# Patient Record
Sex: Female | Born: 1940
Health system: Southern US, Community
[De-identification: ages and names within clinical notes are randomized; demographics above are authoritative.]

## PROBLEM LIST (undated history)

## (undated) DIAGNOSIS — I499 Cardiac arrhythmia, unspecified: Secondary | ICD-10-CM

## (undated) DIAGNOSIS — C189 Malignant neoplasm of colon, unspecified: Secondary | ICD-10-CM

## (undated) DIAGNOSIS — F419 Anxiety disorder, unspecified: Secondary | ICD-10-CM

## (undated) DIAGNOSIS — I639 Cerebral infarction, unspecified: Secondary | ICD-10-CM

## (undated) DIAGNOSIS — I10 Essential (primary) hypertension: Secondary | ICD-10-CM

## (undated) DIAGNOSIS — E669 Obesity, unspecified: Secondary | ICD-10-CM

## (undated) DIAGNOSIS — I6529 Occlusion and stenosis of unspecified carotid artery: Secondary | ICD-10-CM

## (undated) DIAGNOSIS — E7800 Pure hypercholesterolemia, unspecified: Secondary | ICD-10-CM

## (undated) HISTORY — DX: Obesity, unspecified: E66.9

## (undated) HISTORY — DX: Malignant neoplasm of colon, unspecified: C18.9

## (undated) HISTORY — DX: Cerebral infarction, unspecified: I63.9

## (undated) HISTORY — DX: Occlusion and stenosis of unspecified carotid artery: I65.29

## (undated) HISTORY — DX: Essential (primary) hypertension: I10

## (undated) HISTORY — DX: Pure hypercholesterolemia, unspecified: E78.00

---

## 2000-08-20 ENCOUNTER — Ambulatory Visit (HOSPITAL_COMMUNITY): Admission: RE | Admit: 2000-08-20 | Discharge: 2000-08-20 | Payer: Self-pay | Admitting: Family Medicine

## 2000-08-20 ENCOUNTER — Encounter: Payer: Self-pay | Admitting: Family Medicine

## 2001-08-26 ENCOUNTER — Ambulatory Visit (HOSPITAL_COMMUNITY): Admission: RE | Admit: 2001-08-26 | Discharge: 2001-08-26 | Payer: Self-pay | Admitting: Family Medicine

## 2001-08-26 ENCOUNTER — Encounter: Payer: Self-pay | Admitting: Family Medicine

## 2007-02-04 ENCOUNTER — Emergency Department (HOSPITAL_COMMUNITY): Admission: EM | Admit: 2007-02-04 | Discharge: 2007-02-04 | Payer: Self-pay | Admitting: Emergency Medicine

## 2007-10-23 ENCOUNTER — Ambulatory Visit: Payer: Self-pay | Admitting: Gastroenterology

## 2007-11-12 ENCOUNTER — Ambulatory Visit (HOSPITAL_COMMUNITY): Admission: RE | Admit: 2007-11-12 | Discharge: 2007-11-12 | Payer: Self-pay | Admitting: Gastroenterology

## 2007-11-12 ENCOUNTER — Ambulatory Visit: Payer: Self-pay | Admitting: Gastroenterology

## 2007-11-12 ENCOUNTER — Encounter: Payer: Self-pay | Admitting: Gastroenterology

## 2007-11-13 ENCOUNTER — Inpatient Hospital Stay (HOSPITAL_COMMUNITY): Admission: AD | Admit: 2007-11-13 | Discharge: 2007-11-18 | Payer: Self-pay | Admitting: General Surgery

## 2007-11-14 ENCOUNTER — Encounter (INDEPENDENT_AMBULATORY_CARE_PROVIDER_SITE_OTHER): Payer: Self-pay | Admitting: General Surgery

## 2007-11-14 HISTORY — PX: RIGHT COLECTOMY: SHX853

## 2007-12-26 ENCOUNTER — Encounter (HOSPITAL_COMMUNITY): Admission: RE | Admit: 2007-12-26 | Discharge: 2008-01-25 | Payer: Self-pay | Admitting: Oncology

## 2007-12-26 ENCOUNTER — Ambulatory Visit (HOSPITAL_COMMUNITY): Payer: Self-pay | Admitting: Oncology

## 2007-12-31 ENCOUNTER — Ambulatory Visit (HOSPITAL_COMMUNITY): Admission: RE | Admit: 2007-12-31 | Discharge: 2007-12-31 | Payer: Self-pay | Admitting: General Surgery

## 2008-02-02 ENCOUNTER — Encounter (HOSPITAL_COMMUNITY): Admission: RE | Admit: 2008-02-02 | Discharge: 2008-03-03 | Payer: Self-pay | Admitting: Oncology

## 2008-02-13 ENCOUNTER — Ambulatory Visit (HOSPITAL_COMMUNITY): Payer: Self-pay | Admitting: Oncology

## 2008-03-08 ENCOUNTER — Encounter (HOSPITAL_COMMUNITY): Admission: RE | Admit: 2008-03-08 | Discharge: 2008-04-07 | Payer: Self-pay | Admitting: Oncology

## 2008-03-30 ENCOUNTER — Ambulatory Visit (HOSPITAL_COMMUNITY): Payer: Self-pay | Admitting: Oncology

## 2008-04-12 ENCOUNTER — Encounter (HOSPITAL_COMMUNITY): Admission: RE | Admit: 2008-04-12 | Discharge: 2008-05-12 | Payer: Self-pay | Admitting: Oncology

## 2008-05-17 ENCOUNTER — Ambulatory Visit (HOSPITAL_COMMUNITY): Payer: Self-pay | Admitting: Oncology

## 2008-05-17 ENCOUNTER — Encounter (HOSPITAL_COMMUNITY): Admission: RE | Admit: 2008-05-17 | Discharge: 2008-06-16 | Payer: Self-pay | Admitting: Oncology

## 2008-06-17 ENCOUNTER — Encounter (HOSPITAL_COMMUNITY): Admission: RE | Admit: 2008-06-17 | Discharge: 2008-07-17 | Payer: Self-pay | Admitting: Oncology

## 2008-08-12 ENCOUNTER — Ambulatory Visit (HOSPITAL_COMMUNITY): Payer: Self-pay | Admitting: Oncology

## 2008-08-12 ENCOUNTER — Encounter (HOSPITAL_COMMUNITY): Admission: RE | Admit: 2008-08-12 | Discharge: 2008-09-11 | Payer: Self-pay | Admitting: Oncology

## 2008-09-23 ENCOUNTER — Encounter (HOSPITAL_COMMUNITY): Admission: RE | Admit: 2008-09-23 | Discharge: 2008-10-23 | Payer: Self-pay | Admitting: Oncology

## 2008-10-07 ENCOUNTER — Ambulatory Visit (HOSPITAL_COMMUNITY): Payer: Self-pay | Admitting: Oncology

## 2008-11-04 ENCOUNTER — Encounter (HOSPITAL_COMMUNITY): Admission: RE | Admit: 2008-11-04 | Discharge: 2008-12-01 | Payer: Self-pay | Admitting: Oncology

## 2008-12-08 ENCOUNTER — Ambulatory Visit: Payer: Self-pay | Admitting: Gastroenterology

## 2008-12-08 DIAGNOSIS — Z862 Personal history of diseases of the blood and blood-forming organs and certain disorders involving the immune mechanism: Secondary | ICD-10-CM | POA: Insufficient documentation

## 2008-12-08 DIAGNOSIS — Z8639 Personal history of other endocrine, nutritional and metabolic disease: Secondary | ICD-10-CM

## 2008-12-08 DIAGNOSIS — I10 Essential (primary) hypertension: Secondary | ICD-10-CM | POA: Insufficient documentation

## 2008-12-08 DIAGNOSIS — E559 Vitamin D deficiency, unspecified: Secondary | ICD-10-CM | POA: Insufficient documentation

## 2008-12-08 DIAGNOSIS — E7800 Pure hypercholesterolemia, unspecified: Secondary | ICD-10-CM | POA: Insufficient documentation

## 2008-12-08 DIAGNOSIS — Z85038 Personal history of other malignant neoplasm of large intestine: Secondary | ICD-10-CM | POA: Insufficient documentation

## 2008-12-16 ENCOUNTER — Ambulatory Visit (HOSPITAL_COMMUNITY): Payer: Self-pay | Admitting: Oncology

## 2008-12-27 ENCOUNTER — Ambulatory Visit: Payer: Self-pay | Admitting: Gastroenterology

## 2008-12-27 ENCOUNTER — Ambulatory Visit (HOSPITAL_COMMUNITY): Admission: RE | Admit: 2008-12-27 | Discharge: 2008-12-27 | Payer: Self-pay | Admitting: Gastroenterology

## 2008-12-27 ENCOUNTER — Encounter: Payer: Self-pay | Admitting: Gastroenterology

## 2008-12-27 HISTORY — PX: COLONOSCOPY: SHX174

## 2009-01-05 ENCOUNTER — Ambulatory Visit (HOSPITAL_COMMUNITY): Admission: RE | Admit: 2009-01-05 | Discharge: 2009-01-05 | Payer: Self-pay | Admitting: General Surgery

## 2009-03-22 ENCOUNTER — Ambulatory Visit (HOSPITAL_COMMUNITY): Payer: Self-pay | Admitting: Oncology

## 2009-04-08 ENCOUNTER — Encounter (INDEPENDENT_AMBULATORY_CARE_PROVIDER_SITE_OTHER): Payer: Self-pay

## 2009-06-14 ENCOUNTER — Encounter (HOSPITAL_COMMUNITY): Admission: RE | Admit: 2009-06-14 | Discharge: 2009-07-14 | Payer: Self-pay | Admitting: Oncology

## 2009-06-15 ENCOUNTER — Ambulatory Visit (HOSPITAL_COMMUNITY): Payer: Self-pay | Admitting: Oncology

## 2009-09-12 ENCOUNTER — Ambulatory Visit (HOSPITAL_COMMUNITY): Payer: Self-pay | Admitting: Oncology

## 2009-09-12 ENCOUNTER — Encounter (HOSPITAL_COMMUNITY): Admission: RE | Admit: 2009-09-12 | Discharge: 2009-10-12 | Payer: Self-pay | Admitting: Oncology

## 2009-12-05 ENCOUNTER — Ambulatory Visit: Payer: Self-pay | Admitting: Cardiology

## 2009-12-05 ENCOUNTER — Inpatient Hospital Stay (HOSPITAL_COMMUNITY): Admission: EM | Admit: 2009-12-05 | Discharge: 2009-12-08 | Payer: Self-pay | Admitting: Emergency Medicine

## 2009-12-05 DIAGNOSIS — I639 Cerebral infarction, unspecified: Secondary | ICD-10-CM

## 2009-12-05 HISTORY — DX: Cerebral infarction, unspecified: I63.9

## 2009-12-06 ENCOUNTER — Encounter (INDEPENDENT_AMBULATORY_CARE_PROVIDER_SITE_OTHER): Payer: Self-pay | Admitting: Internal Medicine

## 2009-12-08 ENCOUNTER — Inpatient Hospital Stay: Admission: AD | Admit: 2009-12-08 | Discharge: 2009-12-28 | Payer: Self-pay | Admitting: Internal Medicine

## 2009-12-21 ENCOUNTER — Encounter (INDEPENDENT_AMBULATORY_CARE_PROVIDER_SITE_OTHER): Payer: Self-pay | Admitting: *Deleted

## 2010-02-28 ENCOUNTER — Encounter (HOSPITAL_COMMUNITY)
Admission: RE | Admit: 2010-02-28 | Discharge: 2010-03-30 | Payer: Self-pay | Source: Home / Self Care | Attending: Oncology | Admitting: Oncology

## 2010-02-28 ENCOUNTER — Ambulatory Visit (HOSPITAL_COMMUNITY): Payer: Self-pay | Admitting: Oncology

## 2010-03-16 ENCOUNTER — Encounter: Payer: Self-pay | Admitting: Internal Medicine

## 2010-03-16 ENCOUNTER — Telehealth (INDEPENDENT_AMBULATORY_CARE_PROVIDER_SITE_OTHER): Payer: Self-pay | Admitting: *Deleted

## 2010-03-20 ENCOUNTER — Ambulatory Visit: Admit: 2010-03-20 | Payer: Self-pay | Admitting: Urgent Care

## 2010-03-25 ENCOUNTER — Encounter (HOSPITAL_COMMUNITY): Payer: Self-pay | Admitting: Oncology

## 2010-04-04 NOTE — Letter (Signed)
Summary: Recall Colonoscopy/Endoscopy, Change to Office Visit  [facility]  [address]   [place], [place] [address]   Phone: [phone]  Fax: [phone]      April 08, 2009   Janice Curtis [address] [place], [place]  [address] October 13, 1940   Dear Ms. MICHELLI,   According to our records, it is time for you to schedule your follow-up colonoscopy. Please call our office to speak with the nurse, and get scheduled for your appointment. Please do not neglect your health.   Sincerely,   Cloria Spring LPN  [facility] Ph: [phone]   Fax: [phone]  Appended Document: Recall Colonoscopy/Endoscopy, Change to Office Visit TCS OCT 25.

## 2010-04-04 NOTE — Letter (Signed)
Summary: Recall Colonoscopy/Endoscopy, Change to Office Visit  [facility]  [address]   [place], [place] [address]   Phone: [phone]  Fax: [phone]      December 21, 2009   Janice Curtis [address] [place], [place]  [address] [birth year]   Dear Ms. PITSTICK,   According to our records, it is time for you to schedule a Colonoscopy/Endoscopy. However, after reviewing your medical record, we recommend an office visit in order to determine your need for a repeat procedure.  Please call [phone] at your convenience to schedule an office visit. If you have any questions or concerns, please feel free to contact our office.   Sincerely,   Ave Filter  [facility] Ph: [phone]   Fax: [phone]

## 2010-04-06 NOTE — Letter (Signed)
Summary: [facility]  [facility]   Imported By: Rexene Alberts 03/16/2010 09:40:28  _____________________________________________________________________  External Attachment:    Type:   Image     Comment:   External Document

## 2010-04-06 NOTE — Progress Notes (Signed)
Summary: Pt cancelled her OV  Phone Note Call from Patient   Summary of Call: pt was set up for an OV for 03/20/10 @ 10am w/KJ (consult for tcs). Pt called back to cancel appt.. she states she had a mild stroke back in Oct 2011 and had another doctor's appt on Tuesday and "she didn't want to be worrying about all this".   I offered to [facility] appt but pt declined. Initial call taken by: Diana Eves,  March 16, 2010 4:49 PM

## 2010-05-15 LAB — CEA: CEA: <0.5 ng/mL (ref 0.0–5.0)

## 2010-05-18 LAB — LIPID PANEL
Cholesterol: 196 mg/dL (ref 0–200)
LDL Cholesterol: 127 mg/dL — ABNORMAL HIGH (ref 0–99)
Triglycerides: 92 mg/dL (ref <150)
VLDL: 18 mg/dL (ref 0–40)

## 2010-05-18 LAB — CBC
HCT: 39.9 % (ref 36.0–46.0)
Hemoglobin: 13.3 g/dL (ref 12.0–15.0)
Hemoglobin: 13.6 g/dL (ref 12.0–15.0)
MCH: 26.9 pg (ref 26.0–34.0)
MCH: 27 pg (ref 26.0–34.0)
MCHC: 33.4 g/dL (ref 30.0–36.0)
MCV: 80.6 fL (ref 78.0–100.0)
Platelets: 181 10*3/uL (ref 150–400)
RBC: 5.04 MIL/uL (ref 3.87–5.11)
RDW: 13.9 % (ref 11.5–15.5)
WBC: 6.4 10*3/uL (ref 4.0–10.5)

## 2010-05-18 LAB — BASIC METABOLIC PANEL
CO2: 25 mEq/L (ref 19–32)
Calcium: 9.1 mg/dL (ref 8.4–10.5)
Chloride: 97 mEq/L (ref 96–112)
Creatinine, Ser: 0.68 mg/dL (ref 0.4–1.2)
GFR calc Af Amer: >60 mL/min (ref >60)
Sodium: 135 mEq/L (ref 135–145)

## 2010-05-18 LAB — DIFFERENTIAL
Basophils Relative: 1 % (ref 0–1)
Eosinophils Absolute: 0.1 10*3/uL (ref 0.0–0.7)
Eosinophils Absolute: 0.1 10*3/uL (ref 0.0–0.7)
Lymphocytes Relative: 32 % (ref 12–46)
Lymphs Abs: 2 10*3/uL (ref 0.7–4.0)
Lymphs Abs: 2.5 10*3/uL (ref 0.7–4.0)
Monocytes Relative: 6 % (ref 3–12)
Monocytes Relative: 6 % (ref 3–12)
Neutro Abs: 2.7 10*3/uL (ref 1.7–7.7)
Neutrophils Relative %: 48 % (ref 43–77)
Neutrophils Relative %: 59 % (ref 43–77)

## 2010-05-18 LAB — COMPREHENSIVE METABOLIC PANEL
ALT: 20 U/L (ref 0–35)
BUN: 7 mg/dL (ref 6–23)
CO2: 28 mEq/L (ref 19–32)
Calcium: 9.2 mg/dL (ref 8.4–10.5)
GFR calc non Af Amer: >60 mL/min (ref >60)
Glucose, Bld: 96 mg/dL (ref 70–99)
Sodium: 135 mEq/L (ref 135–145)
Total Protein: 7.8 g/dL (ref 6.0–8.3)

## 2010-05-18 LAB — PROTIME-INR
INR: 0.96 (ref 0.00–1.49)
Prothrombin Time: 13 seconds (ref 11.6–15.2)

## 2010-05-18 LAB — CARDIAC PANEL(CRET KIN+CKTOT+MB+TROPI)
CK, MB: 1.9 ng/mL (ref 0.3–4.0)
Relative Index: INVALID (ref 0.0–2.5)
Troponin I: 0.01 ng/mL (ref 0.00–0.06)

## 2010-05-18 LAB — MRSA PCR SCREENING: MRSA by PCR: NEGATIVE

## 2010-05-18 LAB — TSH: TSH: 5.177 u[IU]/mL — ABNORMAL HIGH (ref 0.350–4.500)

## 2010-05-21 LAB — CEA: CEA: 1.3 ng/mL (ref 0.0–5.0)

## 2010-05-23 ENCOUNTER — Encounter (HOSPITAL_COMMUNITY): Payer: Medicare Other | Attending: Oncology

## 2010-05-23 ENCOUNTER — Other Ambulatory Visit (HOSPITAL_COMMUNITY): Payer: Medicare Other

## 2010-05-23 DIAGNOSIS — Z85038 Personal history of other malignant neoplasm of large intestine: Secondary | ICD-10-CM | POA: Insufficient documentation

## 2010-05-23 DIAGNOSIS — C183 Malignant neoplasm of hepatic flexure: Secondary | ICD-10-CM

## 2010-05-24 LAB — CEA: CEA: 1.1 ng/mL (ref 0.0–5.0)

## 2010-06-08 IMAGING — CR DG CHEST 1V PORT
1 series · 1 of 1 positions shown · non-contrast
Comparison: Portable exam 3086 hours compared to 11/12/2007

CLINICAL DATA: Port-A-Cath insertion, colon carcinoma

PORTABLE CHEST - 1 VIEW

[view not recorded]
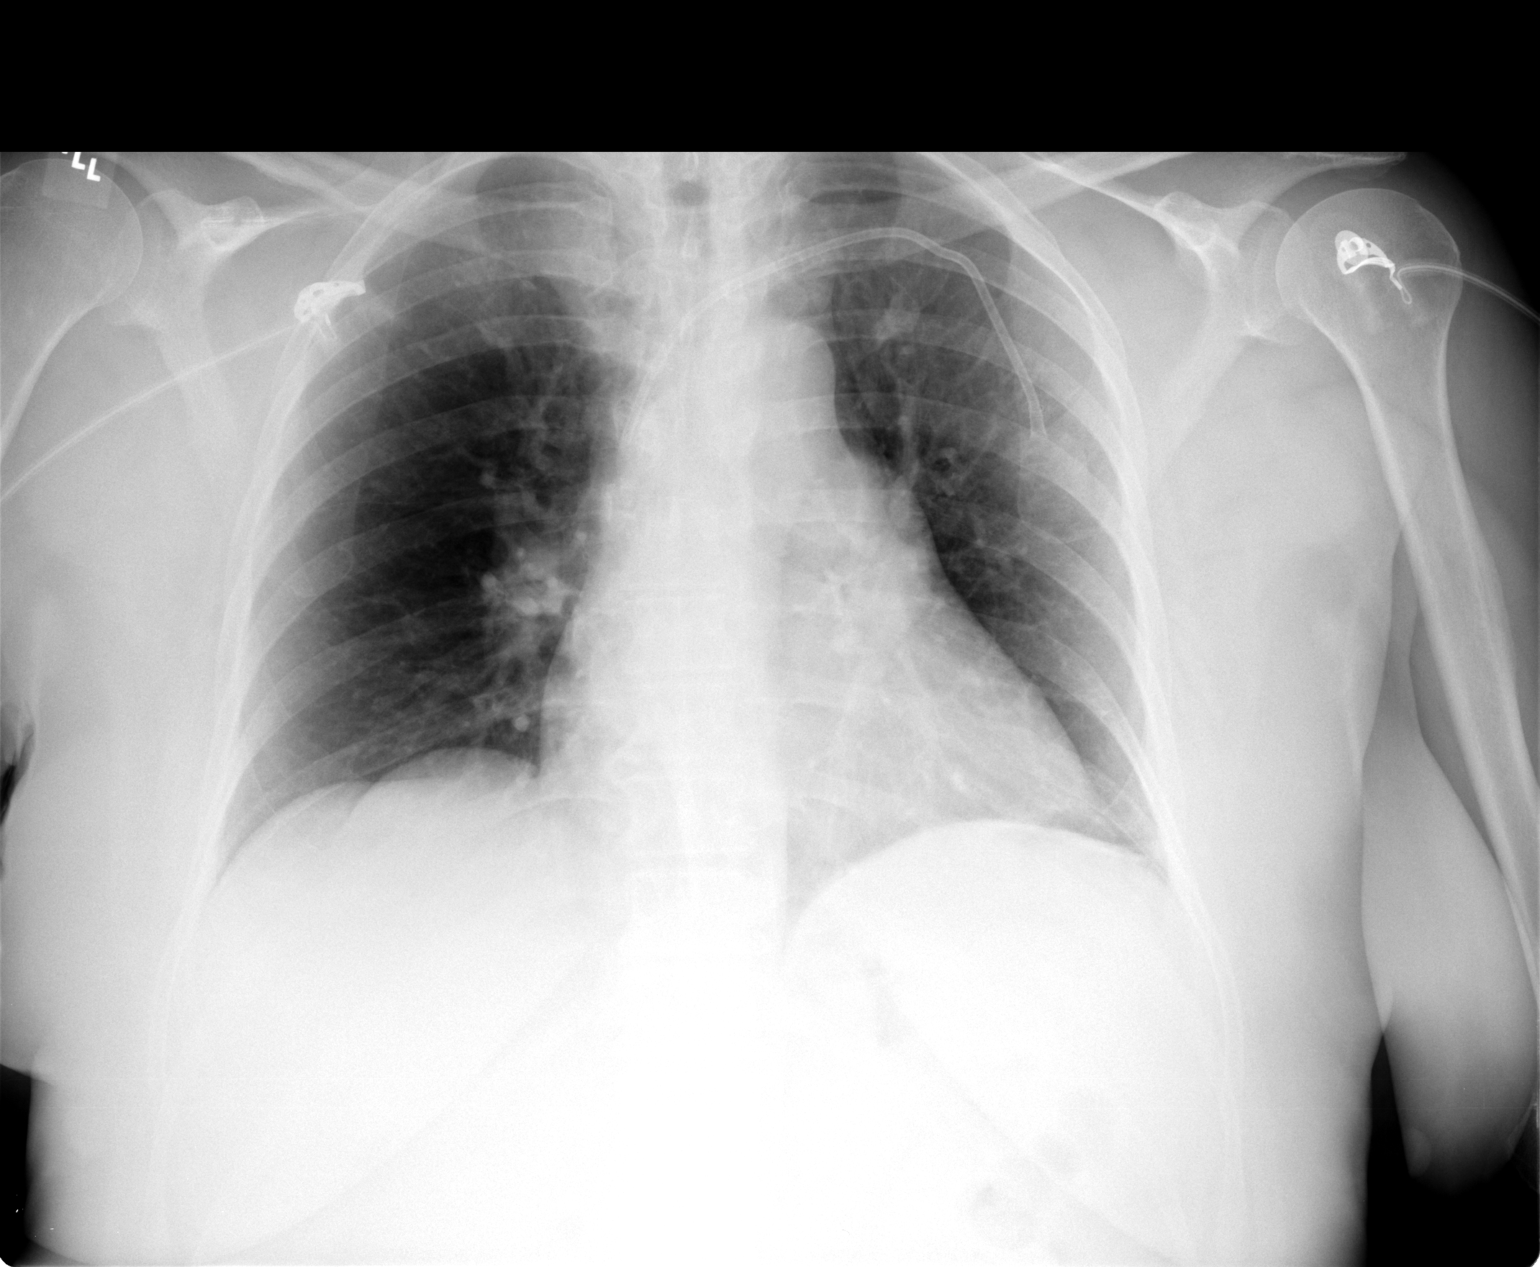

[1 of 1 positions shown; findings below may reference images not displayed]

FINDINGS: New left subclavian Port-A-Cath, tip SVC.
No pneumothorax.
Cardiac enlargement with minimal pulmonary vascular congestion.
Prominent first costochondral junctions bilaterally.
No pulmonary infiltrate or pleural effusion.
Very minimal subsegmental atelectasis left base.
No pulmonary mass or nodule.
IMPRESSION: No pneumothorax following Port-A-Cath insertion.
Cardiomegaly with minimal left basilar atelectasis.

## 2010-06-09 LAB — COMPREHENSIVE METABOLIC PANEL
AST: 36 U/L (ref 0–37)
Albumin: 3.8 g/dL (ref 3.5–5.2)
BUN: 8 mg/dL (ref 6–23)
Calcium: 9.1 mg/dL (ref 8.4–10.5)
Creatinine, Ser: 0.63 mg/dL (ref 0.4–1.2)
GFR calc Af Amer: >60 mL/min (ref >60)
GFR calc non Af Amer: >60 mL/min (ref >60)

## 2010-06-10 LAB — POTASSIUM: Potassium: 3.6 mEq/L (ref 3.5–5.1)

## 2010-06-12 LAB — COMPREHENSIVE METABOLIC PANEL
ALT: 23 U/L (ref 0–35)
Albumin: 3.6 g/dL (ref 3.5–5.2)
Alkaline Phosphatase: 114 U/L (ref 39–117)
BUN: 4 mg/dL — ABNORMAL LOW (ref 6–23)
Chloride: 104 mEq/L (ref 96–112)
Potassium: 3.3 mEq/L — ABNORMAL LOW (ref 3.5–5.1)
Sodium: 139 mEq/L (ref 135–145)
Total Bilirubin: 0.9 mg/dL (ref 0.3–1.2)
Total Protein: 7.6 g/dL (ref 6.0–8.3)

## 2010-06-12 LAB — CBC
HCT: 38 % (ref 36.0–46.0)
Hemoglobin: 13.2 g/dL (ref 12.0–15.0)
MCHC: 34.6 g/dL (ref 30.0–36.0)
MCV: 87.4 fL (ref 78.0–100.0)
Platelets: 120 10*3/uL — ABNORMAL LOW (ref 150–400)
RBC: 4.35 MIL/uL (ref 3.87–5.11)
RDW: 13.6 % (ref 11.5–15.5)
WBC: 4.3 10*3/uL (ref 4.0–10.5)

## 2010-06-12 LAB — DIFFERENTIAL
Basophils Absolute: 0 10*3/uL (ref 0.0–0.1)
Basophils Relative: 1 % (ref 0–1)
Eosinophils Absolute: 0.1 10*3/uL (ref 0.0–0.7)
Eosinophils Relative: 2 % (ref 0–5)
Monocytes Absolute: 0.3 10*3/uL (ref 0.1–1.0)
Monocytes Relative: 8 % (ref 3–12)
Neutro Abs: 1.8 10*3/uL (ref 1.7–7.7)

## 2010-06-12 LAB — CEA: CEA: 1.1 ng/mL (ref 0.0–5.0)

## 2010-06-14 LAB — CBC
HCT: 36.8 % (ref 36.0–46.0)
Hemoglobin: 12.9 g/dL (ref 12.0–15.0)
Hemoglobin: 12.7 g/dL (ref 12.0–15.0)
MCHC: 34.2 g/dL (ref 30.0–36.0)
Platelets: 90 10*3/uL — ABNORMAL LOW (ref 150–400)
RBC: 4.22 MIL/uL (ref 3.87–5.11)
RDW: 16.8 % — ABNORMAL HIGH (ref 11.5–15.5)
RDW: 16 % — ABNORMAL HIGH (ref 11.5–15.5)
WBC: 4.4 10*3/uL (ref 4.0–10.5)

## 2010-06-14 LAB — DIFFERENTIAL
Basophils Absolute: 0 10*3/uL (ref 0.0–0.1)
Basophils Absolute: 0 10*3/uL (ref 0.0–0.1)
Basophils Relative: 1 % (ref 0–1)
Basophils Relative: 1 % (ref 0–1)
Eosinophils Absolute: 0.1 10*3/uL (ref 0.0–0.7)
Lymphocytes Relative: 36 % (ref 12–46)
Monocytes Absolute: 0.6 10*3/uL (ref 0.1–1.0)
Monocytes Relative: 13 % — ABNORMAL HIGH (ref 3–12)
Neutro Abs: 2.5 10*3/uL (ref 1.7–7.7)
Neutro Abs: 2.1 10*3/uL (ref 1.7–7.7)
Neutrophils Relative %: 53 % (ref 43–77)
Neutrophils Relative %: 48 % (ref 43–77)

## 2010-06-14 LAB — COMPREHENSIVE METABOLIC PANEL
ALT: 20 U/L (ref 0–35)
Alkaline Phosphatase: 126 U/L — ABNORMAL HIGH (ref 39–117)
BUN: 4 mg/dL — ABNORMAL LOW (ref 6–23)
Chloride: 103 mEq/L (ref 96–112)
Glucose, Bld: 113 mg/dL — ABNORMAL HIGH (ref 70–99)
Potassium: 2.8 mEq/L — ABNORMAL LOW (ref 3.5–5.1)
Sodium: 139 mEq/L (ref 135–145)
Total Bilirubin: 0.5 mg/dL (ref 0.3–1.2)
Total Protein: 6.4 g/dL (ref 6.0–8.3)

## 2010-06-15 LAB — COMPREHENSIVE METABOLIC PANEL
ALT: 20 U/L (ref 0–35)
Alkaline Phosphatase: 116 U/L (ref 39–117)
CO2: 26 mEq/L (ref 19–32)
Chloride: 106 mEq/L (ref 96–112)
GFR calc non Af Amer: >60 mL/min (ref >60)
Glucose, Bld: 101 mg/dL — ABNORMAL HIGH (ref 70–99)
Potassium: 3.6 mEq/L (ref 3.5–5.1)
Sodium: 138 mEq/L (ref 135–145)
Total Bilirubin: 0.5 mg/dL (ref 0.3–1.2)
Total Protein: 6.7 g/dL (ref 6.0–8.3)

## 2010-06-15 LAB — DIFFERENTIAL
Basophils Absolute: 0 10*3/uL (ref 0.0–0.1)
Basophils Absolute: 0 10*3/uL (ref 0.0–0.1)
Basophils Absolute: 0 10*3/uL (ref 0.0–0.1)
Basophils Relative: 1 % (ref 0–1)
Basophils Relative: 1 % (ref 0–1)
Basophils Relative: 1 % (ref 0–1)
Eosinophils Absolute: 0.1 10*3/uL (ref 0.0–0.7)
Lymphocytes Relative: 32 % (ref 12–46)
Monocytes Relative: 10 % (ref 3–12)
Monocytes Relative: 12 % (ref 3–12)
Neutro Abs: 3.3 10*3/uL (ref 1.7–7.7)
Neutro Abs: 3.2 10*3/uL (ref 1.7–7.7)
Neutrophils Relative %: 57 % (ref 43–77)
Neutrophils Relative %: 55 % (ref 43–77)
Neutrophils Relative %: 60 % (ref 43–77)

## 2010-06-15 LAB — CBC
HCT: 36.5 % (ref 36.0–46.0)
Hemoglobin: 12.3 g/dL (ref 12.0–15.0)
MCHC: 33.9 g/dL (ref 30.0–36.0)
MCHC: 33.7 g/dL (ref 30.0–36.0)
RBC: 4.22 MIL/uL (ref 3.87–5.11)
RBC: 4.31 MIL/uL (ref 3.87–5.11)
RDW: 15.5 % (ref 11.5–15.5)
WBC: 4.5 10*3/uL (ref 4.0–10.5)

## 2010-06-19 LAB — COMPREHENSIVE METABOLIC PANEL
ALT: 18 U/L (ref 0–35)
Albumin: 3.1 g/dL — ABNORMAL LOW (ref 3.5–5.2)
Alkaline Phosphatase: 82 U/L (ref 39–117)
BUN: 6 mg/dL (ref 6–23)
Chloride: 110 mEq/L (ref 96–112)
Glucose, Bld: 119 mg/dL — ABNORMAL HIGH (ref 70–99)
Potassium: 3.7 mEq/L (ref 3.5–5.1)
Sodium: 140 mEq/L (ref 135–145)
Total Bilirubin: 1 mg/dL (ref 0.3–1.2)
Total Protein: 6.4 g/dL (ref 6.0–8.3)

## 2010-06-19 LAB — DIFFERENTIAL
Band Neutrophils: 0 % (ref 0–10)
Basophils Absolute: 0 10*3/uL (ref 0.0–0.1)
Basophils Absolute: 0 10*3/uL (ref 0.0–0.1)
Basophils Relative: 0 % (ref 0–1)
Basophils Relative: 1 % (ref 0–1)
Blasts: 0 %
Eosinophils Absolute: 0.2 10*3/uL (ref 0.0–0.7)
Lymphocytes Relative: 44 % (ref 12–46)
Lymphocytes Relative: 59 % — ABNORMAL HIGH (ref 12–46)
Lymphs Abs: 1.8 10*3/uL (ref 0.7–4.0)
Metamyelocytes Relative: 0 %
Monocytes Absolute: 0.4 10*3/uL (ref 0.1–1.0)
Monocytes Absolute: 0.4 10*3/uL (ref 0.1–1.0)
Monocytes Relative: 14 % — ABNORMAL HIGH (ref 3–12)
Monocytes Relative: 14 % — ABNORMAL HIGH (ref 3–12)
Myelocytes: 0 %
Neutro Abs: 0.7 10*3/uL — ABNORMAL LOW (ref 1.7–7.7)
Neutrophils Relative %: 40 % — ABNORMAL LOW (ref 43–77)
Promyelocytes Absolute: 0 %

## 2010-06-19 LAB — CBC
HCT: 35.6 % — ABNORMAL LOW (ref 36.0–46.0)
HCT: 35.2 % — ABNORMAL LOW (ref 36.0–46.0)
Hemoglobin: 12 g/dL (ref 12.0–15.0)
Hemoglobin: 12.3 g/dL (ref 12.0–15.0)
Hemoglobin: 11.6 g/dL — ABNORMAL LOW (ref 12.0–15.0)
MCHC: 33.4 g/dL (ref 30.0–36.0)
MCHC: 34.5 g/dL (ref 30.0–36.0)
MCV: 79.9 fL (ref 78.0–100.0)
RBC: 4.46 MIL/uL (ref 3.87–5.11)
RBC: 4.62 MIL/uL (ref 3.87–5.11)
RBC: 4.28 MIL/uL (ref 3.87–5.11)
RDW: 25.9 % — ABNORMAL HIGH (ref 11.5–15.5)
RDW: 24.8 % — ABNORMAL HIGH (ref 11.5–15.5)
WBC: 2.8 10*3/uL — ABNORMAL LOW (ref 4.0–10.5)

## 2010-06-20 LAB — COMPREHENSIVE METABOLIC PANEL
ALT: 16 U/L (ref 0–35)
AST: 34 U/L (ref 0–37)
Albumin: 3.4 g/dL — ABNORMAL LOW (ref 3.5–5.2)
Calcium: 8.8 mg/dL (ref 8.4–10.5)
Creatinine, Ser: 0.75 mg/dL (ref 0.4–1.2)
GFR calc Af Amer: >60 mL/min (ref >60)
Sodium: 138 mEq/L (ref 135–145)
Total Protein: 6.6 g/dL (ref 6.0–8.3)

## 2010-06-20 LAB — DIFFERENTIAL
Basophils Relative: 1 % (ref 0–1)
Eosinophils Absolute: 0.1 10*3/uL (ref 0.0–0.7)
Eosinophils Relative: 1 % (ref 0–5)
Eosinophils Relative: 1 % (ref 0–5)
Lymphocytes Relative: 32 % (ref 12–46)
Lymphs Abs: 1.4 10*3/uL (ref 0.7–4.0)
Lymphs Abs: 1.4 10*3/uL (ref 0.7–4.0)
Monocytes Absolute: 0.3 10*3/uL (ref 0.1–1.0)
Monocytes Relative: 6 % (ref 3–12)

## 2010-06-20 LAB — CBC
HCT: 38 % (ref 36.0–46.0)
MCHC: 33.1 g/dL (ref 30.0–36.0)
MCHC: 33.5 g/dL (ref 30.0–36.0)
MCV: 85.7 fL (ref 78.0–100.0)
Platelets: 135 10*3/uL — ABNORMAL LOW (ref 150–400)
RDW: 22 % — ABNORMAL HIGH (ref 11.5–15.5)
RDW: 22.9 % — ABNORMAL HIGH (ref 11.5–15.5)
WBC: 4.1 10*3/uL (ref 4.0–10.5)

## 2010-06-20 LAB — CEA: CEA: 1.5 ng/mL (ref 0.0–5.0)

## 2010-07-18 NOTE — Op Note (Signed)
NAME:  Janice Curtis, ARDILA                 ACCOUNT NO.:  [id number]   MEDICAL RECORD NO.:  [id number]          PATIENT TYPE:  AMB   LOCATION:  DAY                           FACILITY:  [facility]   PHYSICIAN:  Kassie Mends, M.D.      DATE OF BIRTH:  17-Jun-1940   DATE OF PROCEDURE:  11/12/2007  DATE OF DISCHARGE:                               OPERATIVE REPORT   PROCEDURE:  Colonoscopy with Jumbo cold forceps biopsies with Spot  tattoo.   INDICATION FOR EXAM:  Ms. Stapp is a 70 year old female with abdominal  cramping and loose stool.  She never had a colonoscopy.   FINDINGS:  1. Large colon mass narrowing the lumen to less than 10 mm.  The      biopsies were obtained via cold forceps to evaluate for      adenocarcinoma of the colon.  The scope could not be passed beyond      the distal aspect of the mass.  The mass appears to be in the      proximal to mid transverse colon. The distal aspect of mass      tattooed with Spot.  2. Small internal hemorrhoids.  Otherwise, no diverticula or AVMs      seen.   DIAGNOSES:  1. Large constricting colon mass.  2. Small internal hemorrhoids.   RECOMMENDATIONS:  1. CT scan of the abdomen, pelvis, chest x-ray, CBC, PT/PTT, complete      metabolic panel, and Type & Cross for 2u pRBCS today.  2. Patient will a colon resection.  Discussed with Dr. Franky Macho:      appointment 11/13/07-930AM regarding her colon resection.  3. She should be on a clear liquid diet after the CT scan.  4. If the patient needs a right hemicolectomy then repeat colonoscopy      in 1 year.  If she has a left hemicolectomy she will need a repeat      colonoscopy within 6 months.   MEDICATIONS:  1. Demerol 100 mg IV.  2. Versed 5 mg IV.   PROCEDURE TECHNIQUE:  Physical exam was performed.  Informed consent was  obtained from the patient after explaining the benefits, risks and  alternatives to the procedure.  The patient was connected to the monitor  and placed in the left  lateral position.  Continuous oxygen was provided  by nasal cannula, IV medicine administered through an indwelling  cannula.  After administration of sedation and rectal exam, the  patient's rectum was intubated.  The scope  was advanced under direct visualization to the proximal transverse  colon.  The scope was removed slowly by carefully examining the color,  texture, anatomy and integrity of the mucosa on the way out.  The  patient was recovered in endoscopy and discharged home in satisfactory  condition.      Kassie Mends, M.D.  Electronically Signed     SM/MEDQ  D:  11/12/2007  T:  11/12/2007  Job:  [id number]   cc:   Anner Crete  c/o Dr Lovena Le, M.D.  Fax: [phone]   [name], M.D.  Fax: [phone]

## 2010-07-18 NOTE — Consult Note (Signed)
NAME:  Janice Curtis, Janice Curtis                 ACCOUNT NO.:  [id number]   MEDICAL RECORD NO.:  [id number]          PATIENT TYPE:  AMB   LOCATION:  DAY                           FACILITY:  [facility]   PHYSICIAN:  Kassie Mends, M.D.      DATE OF BIRTH:  December 05, 1940   DATE OF CONSULTATION:  10/23/2007  DATE OF DISCHARGE:                                 CONSULTATION   CHIEF COMPLAINT:  Loose stools occasionally, occasional abdominal  cramping, need colonoscopy.   PHYSICIAN REQUESTING CONSULTATION:  [name], MD   HISTORY OF PRESENT ILLNESS:  The patient is a 70 year old African  American female who presents today for further evaluation of  intermittent abdominal cramping and loose stools. She had a routine  physical recently done at [facility] by Earma Reading, PA-  C.  She complained of some intermittent diarrhea and abdominal cramping.  She has never had a colonoscopy.  She was, therefore, referred for  further evaluation.  The patient states that overall she feels well.  She does have occasional loose stools especially if she eats greasy or  spicy foods.  This happens a couple of times a month.  This is usually  associated with some lower abdominal cramping.  Sometimes she will have  the abdominal cramping without any bowel movements.  She denies any  blood in the stool or melena.  Her appetite is good.  No nausea,  vomiting, heartburn, dysphagia, odynophagia, or weight loss.  She never  had a colonoscopy.   CURRENT MEDICATIONS:  1. Verapamil daily.  2. Iron daily.  3. Vitamin D weekly.  4. Aspirin 81 mg daily.   ALLERGIES:  No known drug allergies.   PAST MEDICAL HISTORY:  Hypertension and hypercholesterolemia.   FAMILY HISTORY:  Mother diseased at age 22 with aneurysm, father  diseased at age 79 with stroke.   SOCIAL HISTORY:  She is divorced.  She is retired from [employer]  She  has never been a smoker.  No alcohol use.   REVIEW OF SYSTEMS:  See HPI for GI.   Constitutional:  See HPI.  Cardiopulmonary:  No chest pain, shortness of breath, palpitations, or  cough.  Genitourinary:  No dysuria or hematuria.   PHYSICAL EXAMINATION:  VITAL SIGNS:  Weight 181, height 5 feet 3 inches,  temp 98.7, blood pressure 160/80, and pulse 64.  GENERAL:  Pleasant, well-nourished, well-developed female in no acute  distress.  SKIN:  Warm and dry.  No jaundice.  HEENT:  Sclerae nonicteric.  Oropharyngeal mucosa moist and pink.  No  lesions, erythema, or exudate.  No lymphadenopathy or thyromegaly.  CHEST:  Lungs are clear to auscultation.  CARDIAC:  Regular rate and rhythm.  Normal S1 and S2.  No murmurs, rubs,  or gallops.  ABDOMEN:  Positive bowel sounds.  Soft, nontender, and nondistended.  No  organomegaly or masses.  No rebound or guarding.  No abdominal bruits or  hernias.  LOWER EXTREMITIES:  No edema.   IMPRESSION:  Ms. Puryear is a 70 year old lady who presents with  intermittent infrequent abdominal cramping  and loose stools.  She has  never had a colonoscopy.  She may have irritable bowel syndrome, but  other etiologies such as colitis, stricture, polyps, malignancy need to  be excluded.  I discussed risks, alternatives, and benefits with regards  to, but not limited to the risk, reaction to medication, bleeding,  infection, and perforation at time of colonoscopy and the patient is  agreeable to proceed.   PLAN:  Colonoscopy with Dr. Kassie Mends in the near future.  She will  hold her iron for 7 days prior to procedure.  She will hold her aspirin  for 4 days prior to procedure.  We would like to retrieve a copy of her  labs done recently at [facility].  Further  recommendations to follow.  We would like to thank Earma Reading for  allowing [place] to take part in the care of this patient.      Tana Coast, P.A.      Kassie Mends, M.D.  Electronically Signed    LL/MEDQ  D:  10/23/2007  T:  10/24/2007  Job:  [id number]   cc:    [name], M.D.  Fax: [phone]   Earma Reading, PA-C

## 2010-07-18 NOTE — Discharge Summary (Signed)
NAME:  Janice Curtis, PAWLEY NO.:  [id number]   MEDICAL RECORD NO.:  [id number]          PATIENT TYPE:  INP   LOCATION:  A320                          FACILITY:  [facility]   PHYSICIAN:  [name], M.D.  DATE OF BIRTH:  24-May-1940   DATE OF ADMISSION:  11/13/2007  DATE OF DISCHARGE:  09/15/2009LH                               DISCHARGE SUMMARY   HOSPITAL COURSE SUMMARY:  The patient is a 70 year old black female who  was referred to my care for a colon mass in the ascending colon.  It was  near obstructing in nature.  On preoperative workup, she was found to be  anemic and hypokalemic.  She was admitted to the hospital on November 13, 2007 and received 1 unit packed red blood cells as well as potassium  supplementation.  She subsequently underwent a laparoscopic hand-  assisted right hemicolectomy on November 14, 2007.  She tolerated the  procedure well.  Her postoperative course was for the most part  unremarkable.  Her diet was advanced without difficulty once her bowel  function returned.  She was still mildly anemic with hematocrit of 28 at  the time of discharge, but she was asymptomatic.  Final pathology  revealed T4N1M0 adenocarcinoma of the colon.  She has been made aware of  the diagnosis.   The patient is being discharged home on postoperative day #4 in good and  improving condition.   DISCHARGE INSTRUCTIONS:  The patient is to follow up with Dr. Franky Macho on November 25, 2007.   DISCHARGE MEDICATIONS:  1. Vicodin 1-2 tablets p.o. q.4 h. p.r.n. pain  2. Verapamil 120 mg 2 tablets p.o. q.a.m., 1 tablet p.o. q.p.m.  3. Vitamin D supplementation.  4. Iron supplements.  5. Zocor 1 tablet p.o. daily.   PRINCIPAL DIAGNOSIS:  1. T4N1M0 adenocarcinoma of the colon.  2. Hypertension.  3. Anemia.  4. Hypokalemia, resolved.   PRINCIPAL PROCEDURE:  Laparoscopic hand-assisted right hemicolectomy on  November 14, 2007.      [name], M.D.  Electronically Signed     MAJ/MEDQ  D:  11/18/2007  T:  11/19/2007  Job:  [id number]   cc:   Kassie Mends, M.D.  [address]  [place] , [place] [address]   [name], M.D.  Fax: [phone]

## 2010-07-18 NOTE — H&P (Signed)
NAME:  Janice Curtis, Janice Curtis NO.:  [id number]   MEDICAL RECORD NO.:  [id number]          PATIENT TYPE:  INP   LOCATION:  A320                          FACILITY:  [facility]   PHYSICIAN:  [name], M.D.  DATE OF BIRTH:  Mar 10, 1940   DATE OF ADMISSION:  11/13/2007  DATE OF DISCHARGE:  LH                              HISTORY & PHYSICAL   CHIEF COMPLAINT:  Neoplasm, unspecified.   HISTORY OF PRESENT ILLNESS:  Patient is a 70 year old black female who  is referred for evaluation and treatment of a colon mass.  This is near-  obstructing in nature and is at the hepatic flexure.  This was found on  routine colonoscopy for anemia.  Patient states that she has been anemic  for some time and has taken iron supplements in the past.  She denies  any nausea or vomiting.  She has had some bloody stools recently.  No  fevers or chills have been noted.  She has been on a clear-liquid diet  for the past four days.   PAST MEDICAL HISTORY:  Hypertension.   PAST SURGICAL HISTORY:  Unremarkable.   CURRENT MEDICATIONS:  1. Verapamil.  2. Iron supplements.  3. Vitamin D.   ALLERGIES:  No known drug allergies.   REVIEW OF SYSTEMS:  Noncontributory.   Patient denies any family history of colon carcinoma.  She denies any  recent chest pain, MI, CVA, or diabetes mellitus.   PHYSICAL EXAMINATION:  Patient is a well-developed and well-nourished  black female in no acute distress.  LUNGS:  Clear to auscultation with equal breath sounds bilaterally.  HEART:  Regular rate and rhythm without S3, S4, or murmurs.  ABDOMEN:  Soft, nontender, nondistended.  No hepatosplenomegaly, masses,  or hernias are identified.   CT scan of the abdomen reveals a mass at the hepatic flexure with a tiny  filling defect of the left lobe of the liver.   Her hematocrit is 28, potassium 3.2, CEA level 1.6.   IMPRESSION:  1. Neoplasm, unspecified.  2. Anemia, iron deficiency.  3. Hypokalemia.   PLAN:   Patient will be admitted to the hospital for a blood transfusion  and potassium supplementation.  She will subsequently have a  laparoscopic hand-assisted right hemicolectomy on 11/14/07.  The risks  and benefits of the procedure, including bleeding, infection,  cardiopulmonary difficulties, the possibility of a blood transfusion,  and a the possibility of an open procedure were fully explained to the  patient, who gave informed consent.  She does realize this colon mass  needs to be removed whether or not it is malignant or not by biopsy.      [name], M.D.  Electronically Signed     MAJ/MEDQ  D:  11/13/2007  T:  11/13/2007  Job:  [id number]   cc:   [facility]  Fax: [phone]   Kassie Mends, M.D.  [address]  [place] , [place] [address]   [name], M.D.  Fax: [phone]

## 2010-07-18 NOTE — Op Note (Signed)
NAME:  Janice Curtis, Janice Curtis NO.:  [id number]   MEDICAL RECORD NO.:  [id number]          PATIENT TYPE:  INP   LOCATION:  A320                          FACILITY:  [facility]   PHYSICIAN:  [name], M.D.  DATE OF BIRTH:  1940-09-26   DATE OF PROCEDURE:  11/14/2007  DATE OF DISCHARGE:                               OPERATIVE REPORT   PREOPERATIVE DIAGNOSIS:  Colon neoplasm, unspecified.   POSTOPERATIVE DIAGNOSIS:  Colon neoplasm, unspecified.   PROCEDURE:  Laparoscopic hand-assisted right hemicolectomy.   SURGEON:  [name], MD   ANESTHESIA:  General endotracheal.   INDICATIONS:  The patient is a 70 year old black female who was found on  colonoscopy by Dr. Kassie Mends, to have a colon neoplasm, which was near  obstructing at the hepatic flexure.  CT scan of the abdomen and pelvis  confirms the mass in the hepatic flexure as well as a very tiny filling  defect in the left lobe of the liver.  The patient now comes to the  operating for a laparoscopic hand-assisted right hemicolectomy.  Risks  and benefits of the procedure including bleeding, infection,  cardiopulmonary difficulties, the possibility of blood transfusion, and  the possibility of an open procedure were fully explained to the  patient, gave informed consent.   PROCEDURE NOTE:  The patient was placed in the lithotomy position after  induction of general endotracheal anesthesia.  The abdomen was prepped  and draped using the usual sterile technique with Betadine.  Surgical  site confirmation was performed.   A Pfannenstiel incision was made down to the fascia.  The peritoneal  cavity was entered without difficulty.  A GelPort was then inserted  without difficulty.  An additional 11-mm trocar was placed in the  supraumbilical region and 11-mm trocar was placed in the left lower  quadrant region.  The abdomen was then insufflated without difficulty.  The liver was inspected and the small defect was  seen on CAT scan, it  was a benign hemangioma.  The nasogastric tube was noted to be in  appropriate position in the stomach.  The rest of the liver was within  normal limits.  The mass was noted to be tattooed and at the hepatic  flexure.  The right colon was mobilized along the peritoneal reflection.  This was taken around and adhesions to the liver were also lysed using  the LigaSure.  The mesentery of the right colon and proximal transverse  colon was then mobilized.  The gastrocolic omentum was then lysed using  the LigaSure.  The terminal ileum, ascending colon, and proximal  transverse colon was then exteriorized through the GelPort site.  A GIA  stapler was placed across the terminal ileum as well as the mid  transverse colon and fired.  The mesentery was then divided using the  LigaSure.  The specimen was sent to pathology for further examination.  A side-to-side ileocolic anastomosis was then performed using a GIA  stapler.  The staple line was bolstered using 3-0 silk Lambert sutures.  Omentum was placed over the anastomosis and the bowel was returned to  the abdominal cavity in orderly fashion.  The abdomen was then  reinsufflated and anastomosis was noted be widely patent.  No active  bleeding was noted within the abdominal cavity.  The abdominal cavity  was then copiously irrigated with gentamicin and normal saline.  All  fluid and air were then evacuated from the abdominal cavity prior to  removal of the GelPort and the trocars.   All wounds were irrigated with normal saline.  All wounds were injected  with 0.5% Sensorcaine.  The Pfannenstiel peritoneal layer was closed  using 0 chromic running suture.  The fascia was reapproximated using 0  Vicryl interrupted sutures.  The subcutaneous layer was reapproximated  using 2-0 Vicryl interrupted sutures.  The skin was closed using  staples.  The other trocar sites were closed using staples.  Betadine  ointment and dry sterile  dressings were applied.   All tape and needle counts correct at the end of the procedure.  The  patient was extubated in the operating room and went back to recovery  room awake and in stable condition.   COMPLICATIONS:  None.   SPECIMEN:  Right colon.   ESTIMATED BLOOD LOSS:  100 mL.      [name], M.D.  Electronically Signed     MAJ/MEDQ  D:  11/14/2007  T:  11/15/2007  Job:  [id number]   cc:   Kassie Mends, M.D.  [address]  [place] , [place] [address]   [name], M.D.  Fax: [phone]

## 2010-07-18 NOTE — H&P (Signed)
NAME:  Janice Curtis, SPLINTER NO.:  [id number]   MEDICAL RECORD NO.:  [id number]          PATIENT TYPE:  AMB   LOCATION:  DAY                           FACILITY:  [facility]   PHYSICIAN:  [name], M.D.  DATE OF BIRTH:  June 22, 1940   DATE OF ADMISSION:  DATE OF DISCHARGE:  LH                              HISTORY & PHYSICAL   CHIEF COMPLAINT:  Colon carcinoma, need for central venous access.   HISTORY OF PRESENT ILLNESS:  The patient is a 70 year old black female  recently diagnosed with colon cancer, who was about to undergo  chemotherapy and needs a Port-A-Cath for central venous access.   PAST MEDICAL HISTORY:  Includes hypertension.   PAST SURGICAL HISTORY:  Laparoscopic right hemicolectomy on November 14, 2007.   CURRENT MEDICATIONS:  Verapamil, cholesterol supplements, and iron  supplements.   ALLERGIES:  No known drug allergies.   REVIEW OF SYSTEMS:  Noncontributory.   PHYSICAL EXAMINATION:  VITAL SIGNS:  The patient is a well-developed,  well-nourished black female in no acute distress.  LUNGS:  Clear to auscultation with equal breath sounds bilaterally.  HEART:  Reveals a regular rate and rhythm without S3, S4, or murmurs.   IMPRESSION:  Colon carcinoma, need for central venous access.   PLAN:  The patient is scheduled for Port-A-Cath insertion on December 31, 2007.  The risks and benefits of the procedure including bleeding,  infection, and pneumothorax were fully explained to the patient, who  gave informed consent.      [name], M.D.  Electronically Signed     MAJ/MEDQ  D:  12/26/2007  T:  12/26/2007  Job:  [id number]   cc:   Ladona Horns. Mariel Sleet, MD  Fax: [phone]   Kassie Mends, M.D.  [address]  [place] , [place] [address]   [name], M.D.  Fax: [phone]

## 2010-07-18 NOTE — Op Note (Signed)
NAME:  Janice Curtis, Janice Curtis               ACCOUNT NO.:  [id number]   MEDICAL RECORD NO.:  [id number]          PATIENT TYPE:  AMB   LOCATION:  DAY                           [facility]AC[facility]   PHYSICIAN:  [name], M.D.  DATE OF BIRTH:  [birth year]   DATE OF PROCEDURE:  12/31/2007  DATE OF DISCHARGE:                               OPERATIVE REPORT   PREOPERATIVE DIAGNOSIS:  Colon carcinoma, need for central venous  access.   POSTOPERATIVE DIAGNOSIS:  Colon carcinoma, need for central venous  access.   PROCEDURE:  Port-A-Cath insertion.   SURGEON:  [name], MD   ANESTHESIA:  MAC.   INDICATIONS:  The patient is a 70 year old black female with colon  carcinoma who is about to undergo chemotherapy and needs central venous  access.  The risks and benefits of the procedure including bleeding,  infection, pneumothorax were fully explained to the patient, gave  informed consent.   PROCEDURE NOTE:  The patient was placed in Trendelenburg position after  the left upper chest was prepped and draped using the usual sterile  technique with Betadine.  Surgical site confirmation was performed.  Xylocaine 1% was used for local anesthesia.   A transverse incision was made in the left infraclavicular region.  Subcutaneous pocket was then formed.  A needle was advanced into left  subclavian vein using the Seldinger technique without difficulty.  A  guidewire was then advanced into the right atrium under fluoroscopic  guidance.  An introducer and peel-away sheath were then placed over the  guidewire.  The catheter was then inserted through the peel-away sheath  and the peel-away sheath was removed.  The catheter was then attached to  the port and the port placed in subcutaneous pocket.  Adequate  positioning was confirmed by fluoroscopy.  Good back bleeding was noted  in the port.  The port was flushed with 3000 units of heparin.  The  subcutaneous layer was reapproximated using a 3-0  Vicryl interrupted  suture.  The skin was closed using a 4-0 Vicryl subcuticular suture.  Dermabond was then applied.   All tape and needle counts were correct at the end of the procedure.  The patient was transferred to PACU in a stable condition.  Chest x-ray  will be performed at that time.   COMPLICATIONS:  None.   SPECIMEN:  None.   ESTIMATED BLOOD LOSS:  Minimal.      [name], M.D.  Electronically Signed     MAJ/MEDQ  D:  12/31/2007  T:  12/31/2007  Job:  [id number]   cc:   Ladona Horns. Mariel Sleet, MD  Fax: [phone]   Kassie Mends, M.D.  [address]  [place] , [place] [address]   [name], M.D.  Fax: [phone]

## 2010-08-15 ENCOUNTER — Encounter (HOSPITAL_COMMUNITY): Payer: Self-pay | Admitting: *Deleted

## 2010-08-21 ENCOUNTER — Other Ambulatory Visit (HOSPITAL_COMMUNITY): Payer: Self-pay | Admitting: Oncology

## 2010-08-21 DIAGNOSIS — C189 Malignant neoplasm of colon, unspecified: Secondary | ICD-10-CM

## 2010-09-12 ENCOUNTER — Encounter (HOSPITAL_COMMUNITY): Payer: Medicare Other | Attending: Oncology | Admitting: Oncology

## 2010-09-12 ENCOUNTER — Encounter (HOSPITAL_COMMUNITY): Payer: Self-pay | Admitting: Oncology

## 2010-09-12 VITALS — BP 125/78 | HR 98 | Temp 98.0°F | Wt 192.6 lb

## 2010-09-12 DIAGNOSIS — Z85038 Personal history of other malignant neoplasm of large intestine: Secondary | ICD-10-CM | POA: Insufficient documentation

## 2010-09-12 DIAGNOSIS — Z7902 Long term (current) use of antithrombotics/antiplatelets: Secondary | ICD-10-CM | POA: Insufficient documentation

## 2010-09-12 DIAGNOSIS — Z862 Personal history of diseases of the blood and blood-forming organs and certain disorders involving the immune mechanism: Secondary | ICD-10-CM

## 2010-09-12 DIAGNOSIS — Z09 Encounter for follow-up examination after completed treatment for conditions other than malignant neoplasm: Secondary | ICD-10-CM | POA: Insufficient documentation

## 2010-09-12 DIAGNOSIS — I10 Essential (primary) hypertension: Secondary | ICD-10-CM | POA: Insufficient documentation

## 2010-09-12 DIAGNOSIS — Z8673 Personal history of transient ischemic attack (TIA), and cerebral infarction without residual deficits: Secondary | ICD-10-CM | POA: Insufficient documentation

## 2010-09-12 DIAGNOSIS — Z79899 Other long term (current) drug therapy: Secondary | ICD-10-CM | POA: Insufficient documentation

## 2010-09-12 DIAGNOSIS — E669 Obesity, unspecified: Secondary | ICD-10-CM

## 2010-09-12 DIAGNOSIS — C183 Malignant neoplasm of hepatic flexure: Secondary | ICD-10-CM

## 2010-09-12 LAB — CBC
MCH: 26.6 pg (ref 26.0–34.0)
MCV: 80.3 fL (ref 78.0–100.0)
Platelets: 190 10*3/uL (ref 150–400)
RDW: 13.6 % (ref 11.5–15.5)
WBC: 6.1 10*3/uL (ref 4.0–10.5)

## 2010-09-12 LAB — COMPREHENSIVE METABOLIC PANEL
AST: 20 U/L (ref 0–37)
Albumin: 3.9 g/dL (ref 3.5–5.2)
Calcium: 9.6 mg/dL (ref 8.4–10.5)
Creatinine, Ser: 0.73 mg/dL (ref 0.50–1.10)

## 2010-09-12 LAB — DIFFERENTIAL
Basophils Absolute: 0 10*3/uL (ref 0.0–0.1)
Eosinophils Absolute: 0.1 10*3/uL (ref 0.0–0.7)
Eosinophils Relative: 2 % (ref 0–5)
Lymphocytes Relative: 41 % (ref 12–46)

## 2010-09-12 LAB — CEA: CEA: 0.9 ng/mL (ref 0.0–5.0)

## 2010-09-12 NOTE — Progress Notes (Signed)
This office note has been dictated.

## 2010-09-12 NOTE — Progress Notes (Deleted)
Janice Ruths, MD [address] [place] [address]  No diagnosis found.  CURRENT THERAPY:  INTERVAL HISTORY: Janice Curtis 70 y.o. female returns for *** regular *** visit for followup of ***   Past Medical History  Diagnosis Date  . Colon cancer   . Stroke 12/05/2009  . Hypercholesterolemia   . Hypertension     has VITAMIN D DEFICIENCY; HYPERCHOLESTEROLEMIA; HYPERTENSION; ADENOCARCINOMA, COLON, HX OF; HYPOKALEMIA, HX OF; ANEMIA, IRON DEFICIENCY, HX OF; and Colon cancer on her problem list.      has no known allergies.  [name] does not currently have medications on file.  Past Surgical History  Procedure Date  . Right colectomy 11/14/2007    *** denies any headaches, dizziness, double vision, fevers, chills, night sweats, nausea, vomiting, diarrhea, constipation, chest pain, heart palpitations, shortness of breath, blood in stool, black tarry stool, urinary pain, urinary burning, urinary frequency, hematuria.   PHYSICAL EXAMINATION  ECOG PERFORMANCE STATUS: {CHL ONC ECOG WU:[id number]}  Filed Vitals:   09/12/10 0911  BP: 125/78  Pulse: 98  Temp: 98 F (36.7 C)    GENERAL:{CHL ONC PE [phone]} SKIN: {CHL ONC PE NFAO:[id number]} HEAD: {CHL ONC PE NGEX:5284132440} EYES: {CHL ONC PE NUUV:2536644034} EARS: {CHL ONC PE VQQV:9563875643} OROPHARYNX:{CHL ONC PE [phone]}  NECK: {CHL ONC PE PIRJ:1884166063} LYMPH:  {CHL ONC PE KZSWF:0932355732} BREAST:{CHL ONC PE BREAST:[phone]} LUNGS: {CHL ONC PE KGURK:2706237628} HEART: {CHL ONC PE BTDVV:6160737106} ABDOMEN:{CHL ONC PE ABDOMEN:[phone]} BACK: {CHL ONC PE YIRS:8546270350} EXTREMITIES:{CHL ONC PE EXTREMITIES:[phone]}  NEURO: {CHL ONC PE NEURO:[phone]} PELVIC:{CHL ONC PE PELVIC:[phone]} RECTAL: {CHL ONC PE RECTAL:[phone]}    LABORATORY DATA:  {*** HELP TEXT ***  This SmartLink requires parameters for processing. Parameters are variables  that can be added to the original SmartLink name. This allows the user to request specific information by giving the SmartLink precise direction.  The LabLast SmartLink accepts a list of result component base names separated by commas. The user can also request the number of results to display for each component. To indicate the number of results for each component, type the component name followed by a colon and then the number of results. (Component name:4). For all results for that particular component, type a star in place of the number (Component name:*). To obtain the last result for a particular component, type the component base name without the colon, number, or star. Ex: .LabLast[RBC. Depending on how the SmartLink is setup, results may be limited by a lookback period. In this case, any results older than the lookback period will not be displayed.  The SmartLink will display the name of the component(s) with the units used next to the name followed by a table listing the date and the result value for each result requested.  Example: .LabLast[MCH:3,MCV:*,HCT   This example would display a table for all components whose base name matches '[facility]', 'MCV', or 'HCT'. The first would contain the last three results of each component that has a base name of [facility], the second would contain all the entered results for components with MCV as the base name, and the third would contain the last entered result for all components with a base name of HCT.}   PENDING LABS:   RADIOGRAPHIC STUDIES:  @[email or web address] @   PATHOLOGY:    ASSESSMENT: ***   PLAN: ***   All questions were answered. The patient knows to call the clinic with any problems, questions or concerns. We can certainly see the patient much sooner if necessary.  The patient and plan discussed with {CHL ONC MEDICAL [phone]} and he is in agreement with the aforementioned.  I spent {CHL ONC TIME VISIT - HYQMV:7846962952}  counseling the patient face to face. The total time spent in the appointment was {CHL ONC TIME VISIT - WUXLK:4401027253}.  Janice Curtis

## 2010-09-12 NOTE — Progress Notes (Signed)
CC:   Janice Curtis, M.D. Janice Curtis, M.D. Janice Curtis, M.D.  DIAGNOSES: 1. Colon cancer, stage IIIC advanced of the hepatic flexure status     post resection by Dr. Franky Macho on 11/14/2007 for a grade 1, 5.4-     cm cancer but resection margins were within less than 1/10 of a     centimeter.  There was perforation of the visceral peritoneum and     her depth of invasion was through the muscularis propria into the     pericolonic soft tissues.  Two of 17 nodes were positive giving her     T4 N1 lesion.  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 2. Obesity. 3. Hypertension. 4. History of a stroke on Plavix presently.  Her stroke took place in     October 2011.  Janice Curtis really is not exercising on a regular basis.  She has actually gained weight from 186 pounds in January to 192 pounds.  She has all kinds of reasons as to why she cannot loose this weight, but I am encouraging her to at least try to walk 20 minutes twice a day, dance with her grandson 20 minutes twice a day which she minds all the time or something at the local [facility].  PHYSICAL EXAMINATION:  Vital signs:  Otherwise her vital signs show her blood pressure is well-controlled 125/78.  She has respiratory rate of 18 and unlabored.  Pulse right around 100 and regular.  She is afebrile. General:  She is in no acute distress.  She is alert and oriented. Facial symmetry appears intact.  Nails are without discoloration, cyanosis or clubbing.  Lungs:  Clear to auscultation and percussion. Breast exam:  Bilaterally is negative for any masses.  She has no adenopathy in the cervical, supraclavicular, infraclavicular, axillary or inguinal areas.  Heart:  Shows a regular rhythm and rate without obvious murmur, rub or gallop.  Abdomen:  Soft, nontender, obese but without obvious organomegaly or masses.  Bowel sounds are normal to slightly decreased.  She has no peripheral edema of the arms or  legs. We will see her back in 6 months.  She did not get her labs last month due to an oversight and probably transitioned to the new computer system but we will get those today.  We will schedule her for CT abdomen and pelvis in October which we have already done.  I will see her in 6 months.    ______________________________ Ladona Horns. Mariel Sleet, MD ESN/MEDQ  D:  09/12/2010  T:  09/12/2010  Job:  [id number]

## 2010-09-12 NOTE — Patient Instructions (Signed)
[facility]  Discharge Instructions  RECOMMENDATIONS MADE BY THE CONSULTANT AND ANY TEST RESULTS WILL BE SENT TO YOUR REFERRING DOCTOR.   EXAM FINDINGS BY MD TODAY AND SIGNS AND SYMPTOMS TO REPORT TO CLINIC OR PRIMARY MD: Doing well, need to lose weight   SPECIAL INSTRUCTIONS/FOLLOW-UP: Lab work Needed cbcdiff, cmet & cea today and in 6 months, Xray Studies Needed CT of Abdomen and Pelvis in 6 months and Return to Clinic on 6months   I acknowledge that I have been informed and understand all the instructions given to me and received a copy. I do not have any more questions at this time, but understand that I may call the Specialty Clinic at [facility] at [phone] during business hours should I have any further questions or need assistance in obtaining follow-up care.    __________________________________________  _____________  __________ Signature of Patient or Authorized Representative            Date                   Time    __________________________________________ Nurse's Signature

## 2010-09-28 NOTE — Progress Notes (Signed)
Please contact pt. She should have had a TCS in 2011. She needs TCS for colon cancer follow up, MOVIPREP. Pt needs to be triaged.

## 2010-10-03 ENCOUNTER — Telehealth: Payer: Self-pay

## 2010-10-03 NOTE — Telephone Encounter (Signed)
Gastroenterology Pre-Procedure Form  Request Date: 09/28/2010    Requesting Physician: Mariel Sleet     PATIENT INFORMATION:  Janice Curtis is a 70 y.o., female (DOB=12/17/1940).  PROCEDURE: Procedure(s) requested: colonoscopy Procedure Reason: previous colon cancer  PATIENT REVIEW QUESTIONS: The patient reports the following:   1. Diabetes Melitis: no 2. Joint replacements in the past 12 months: no 3. Major health problems in the past 3 months: no 4. Has an artificial valve or MVP:no 5. Has been advised in past to take antibiotics in advance of a procedure like teeth cleaning: no}    MEDICATIONS & ALLERGIES:    Patient reports the following regarding taking any blood thinners:   Plavix? no Aspirin?yes yes Coumadin?  no  Patient confirms/reports the following medications:  Current Outpatient Prescriptions  Medication Sig Dispense Refill  . acetaminophen (TYLENOL) 500 MG tablet Take 500 mg by mouth every 6 (six) hours as needed.        Marland Kitchen amLODipine (NORVASC) 10 MG tablet Take 10 mg by mouth daily.        . Cholecalciferol (VITAMIN D3) 1000 UNITS CAPS Take 1,000 Units by mouth daily.        . Cyanocobalamin (VITAMIN B 12 PO) Take 1 tablet by mouth daily.       . simvastatin (ZOCOR) 20 MG tablet Take 20 mg by mouth at bedtime.        . verapamil (CALAN) 120 MG tablet Take 120 mg by mouth 2 (two) times daily. Taking 2 each morning and 1 at night      . Casanthranol-Docusate Sodium 30-100 MG CAPS Take by mouth daily as needed.        . clopidogrel (PLAVIX) 75 MG tablet Take 75 mg by mouth daily. Pt says she cannot afford the Plavix      . ferrous fumarate-iron polysaccharide complex (TANDEM) 162-115.2 MG CAPS Take 1 capsule by mouth daily with breakfast.        . potassium chloride (MICRO-K) 10 MEQ CR capsule Take 20 mEq by mouth daily.          Patient confirms/reports the following allergies:  No Known Allergies  Patient is appropriate to schedule for requested procedure(s):  yes  AUTHORIZATION INFORMATION Primary Insurance: [employer]   ID #: [id number]  Group #:  Pre-Cert / Berkley Harvey required:  Pre-Cert / Auth #:   Secondary Insurance: ID #:  Group #:  Pre-Cert / Auth required: Pre-Cert / Auth #:   No orders of the defined types were placed in this encounter.    SCHEDULE INFORMATION: Procedure has been scheduled as follows:  Date:     Time:  Location:   This Gastroenterology Pre-Precedure Form is being routed to the following provider(s) for review: [name], MD  Pt does not want to schedule yet. Said she will call back to schedule.   Per Dr. Darrick Penna, pt can have Movie Prep

## 2010-10-03 NOTE — Telephone Encounter (Signed)
NOTED

## 2010-10-20 ENCOUNTER — Other Ambulatory Visit (HOSPITAL_COMMUNITY): Payer: Self-pay | Admitting: Family Medicine

## 2010-10-20 DIAGNOSIS — Z139 Encounter for screening, unspecified: Secondary | ICD-10-CM

## 2010-10-24 ENCOUNTER — Ambulatory Visit (HOSPITAL_COMMUNITY)
Admission: RE | Admit: 2010-10-24 | Discharge: 2010-10-24 | Disposition: A | Discharge status: Home / Self Care | Payer: Medicare Other | Source: Ambulatory Visit | Attending: Family Medicine | Admitting: Family Medicine

## 2010-10-24 DIAGNOSIS — Z139 Encounter for screening, unspecified: Secondary | ICD-10-CM

## 2010-10-24 DIAGNOSIS — Z1231 Encounter for screening mammogram for malignant neoplasm of breast: Secondary | ICD-10-CM | POA: Insufficient documentation

## 2010-12-04 LAB — HEMOGLOBIN AND HEMATOCRIT, BLOOD: HCT: 28 — ABNORMAL LOW

## 2010-12-05 LAB — DIFFERENTIAL
Basophils Absolute: 0 10*3/uL (ref 0.0–0.1)
Basophils Relative: 1
Basophils Relative: 1 % (ref 0–1)
Eosinophils Absolute: 0.1 10*3/uL (ref 0.0–0.7)
Eosinophils Absolute: 0.2
Eosinophils Relative: 2 % (ref 0–5)
Eosinophils Relative: 5
Lymphocytes Relative: 43
Lymphs Abs: 1.7
Lymphs Abs: 1.9
Monocytes Absolute: 0.5
Monocytes Absolute: 0.5 10*3/uL (ref 0.1–1.0)
Monocytes Relative: 7
Monocytes Relative: 10
Neutro Abs: 4.3
Neutro Abs: 1.6 10*3/uL — ABNORMAL LOW (ref 1.7–7.7)

## 2010-12-05 LAB — CBC
HCT: 38.8
HCT: 34.9 % — ABNORMAL LOW (ref 36.0–46.0)
Hemoglobin: 12.5
Hemoglobin: 11.8 g/dL — ABNORMAL LOW (ref 12.0–15.0)
MCHC: 32.2
MCHC: 33.7 g/dL (ref 30.0–36.0)
MCHC: 33.1
MCV: 74.3 — ABNORMAL LOW
MCV: 73.1 fL — ABNORMAL LOW (ref 78.0–100.0)
Platelets: 270
Platelets: 237
RBC: 5.23 — ABNORMAL HIGH
RDW: 21.5 — ABNORMAL HIGH
RDW: 18.9 % — ABNORMAL HIGH (ref 11.5–15.5)
RDW: 19.1 — ABNORMAL HIGH
WBC: 6.7

## 2010-12-05 LAB — COMPREHENSIVE METABOLIC PANEL
ALT: 14
AST: 21
Albumin: 3.6
Alkaline Phosphatase: 80
BUN: 7
Calcium: 9
Creatinine, Ser: 0.55
GFR calc Af Amer: >60
Potassium: 3.5
Sodium: 135
Sodium: 135
Total Protein: 8.1
Total Protein: 6.7

## 2010-12-05 LAB — CEA
CEA: 1
CEA: 0.8

## 2010-12-06 LAB — BASIC METABOLIC PANEL
BUN: 1 — ABNORMAL LOW
BUN: 1 — ABNORMAL LOW
CO2: 26
CO2: 26
CO2: 26
Calcium: 8.2 — ABNORMAL LOW
Calcium: 8.4
Chloride: 103
Chloride: 106
Creatinine, Ser: 0.61
Creatinine, Ser: 0.69
GFR calc Af Amer: >60
GFR calc Af Amer: >60
Glucose, Bld: 108 — ABNORMAL HIGH
Glucose, Bld: 122 — ABNORMAL HIGH
Potassium: 3.7
Potassium: 4.1
Sodium: 134 — ABNORMAL LOW

## 2010-12-06 LAB — HEMOGLOBIN AND HEMATOCRIT, BLOOD: Hemoglobin: 10.1 — ABNORMAL LOW

## 2010-12-06 LAB — COMPREHENSIVE METABOLIC PANEL
Albumin: 3.2 — ABNORMAL LOW
Alkaline Phosphatase: 64
BUN: 6
Chloride: 102
Creatinine, Ser: 0.69
GFR calc non Af Amer: >60
Glucose, Bld: 109 — ABNORMAL HIGH
Potassium: 3.2 — ABNORMAL LOW
Total Bilirubin: 0.7

## 2010-12-06 LAB — DIFFERENTIAL
Basophils Absolute: 0
Basophils Relative: 0
Basophils Relative: 1
Eosinophils Absolute: 0.2
Eosinophils Relative: 1
Lymphocytes Relative: 13
Monocytes Absolute: 0.6
Monocytes Relative: 6
Monocytes Relative: 7
Neutro Abs: 7.9 — ABNORMAL HIGH
Neutrophils Relative %: 75

## 2010-12-06 LAB — CBC
HCT: 28.8 — ABNORMAL LOW
HCT: 31 — ABNORMAL LOW
Hemoglobin: 10.1 — ABNORMAL LOW
MCHC: 31.9
MCHC: 32.1
MCHC: 32.7
MCV: 70.1 — ABNORMAL LOW
MCV: 72.6 — ABNORMAL LOW
RBC: 4.01
RBC: 4.1
RBC: 4.26
RDW: 22.9 — ABNORMAL HIGH
WBC: 4

## 2010-12-06 LAB — ALBUMIN: Albumin: 2.6 — ABNORMAL LOW

## 2010-12-06 LAB — CROSSMATCH

## 2010-12-06 LAB — PHOSPHORUS: Phosphorus: 3.1

## 2010-12-06 LAB — PROTIME-INR
INR: 1.1
Prothrombin Time: 14.3

## 2010-12-06 LAB — APTT: aPTT: 27

## 2010-12-06 LAB — CEA: CEA: 1.6

## 2010-12-08 LAB — CBC
Hemoglobin: 12.1 g/dL (ref 12.0–15.0)
Hemoglobin: 12.1 g/dL (ref 12.0–15.0)
Hemoglobin: 12.2 g/dL (ref 12.0–15.0)
MCHC: 33.2 g/dL (ref 30.0–36.0)
MCHC: 33.3 g/dL (ref 30.0–36.0)
MCV: 73.1 fL — ABNORMAL LOW (ref 78.0–100.0)
RBC: 4.85 MIL/uL (ref 3.87–5.11)
RBC: 4.93 MIL/uL (ref 3.87–5.11)
RBC: 4.97 MIL/uL (ref 3.87–5.11)
RDW: 18.6 % — ABNORMAL HIGH (ref 11.5–15.5)
WBC: 3.1 10*3/uL — ABNORMAL LOW (ref 4.0–10.5)

## 2010-12-08 LAB — COMPREHENSIVE METABOLIC PANEL
CO2: 24 mEq/L (ref 19–32)
Calcium: 8.9 mg/dL (ref 8.4–10.5)
Creatinine, Ser: 0.65 mg/dL (ref 0.4–1.2)
GFR calc Af Amer: >60 mL/min (ref >60)
GFR calc non Af Amer: >60 mL/min (ref >60)
Glucose, Bld: 100 mg/dL — ABNORMAL HIGH (ref 70–99)

## 2010-12-08 LAB — DIFFERENTIAL
Basophils Absolute: 0 10*3/uL (ref 0.0–0.1)
Basophils Absolute: 0 10*3/uL (ref 0.0–0.1)
Basophils Relative: 1 % (ref 0–1)
Lymphocytes Relative: 49 % — ABNORMAL HIGH (ref 12–46)
Lymphocytes Relative: 57 % — ABNORMAL HIGH (ref 12–46)
Lymphs Abs: 1.5 10*3/uL (ref 0.7–4.0)
Monocytes Absolute: 0.2 10*3/uL (ref 0.1–1.0)
Monocytes Absolute: 0.4 10*3/uL (ref 0.1–1.0)
Monocytes Relative: 12 % (ref 3–12)
Monocytes Relative: 14 % — ABNORMAL HIGH (ref 3–12)
Monocytes Relative: 14 % — ABNORMAL HIGH (ref 3–12)
Neutro Abs: 0.4 10*3/uL — ABNORMAL LOW (ref 1.7–7.7)
Neutro Abs: 1.1 10*3/uL — ABNORMAL LOW (ref 1.7–7.7)

## 2010-12-21 ENCOUNTER — Encounter (HOSPITAL_COMMUNITY): Payer: Medicare Other | Attending: Oncology

## 2010-12-21 DIAGNOSIS — Z85038 Personal history of other malignant neoplasm of large intestine: Secondary | ICD-10-CM

## 2010-12-21 DIAGNOSIS — I10 Essential (primary) hypertension: Secondary | ICD-10-CM

## 2010-12-21 DIAGNOSIS — Z79899 Other long term (current) drug therapy: Secondary | ICD-10-CM | POA: Insufficient documentation

## 2010-12-21 DIAGNOSIS — E785 Hyperlipidemia, unspecified: Secondary | ICD-10-CM | POA: Insufficient documentation

## 2010-12-21 DIAGNOSIS — Z09 Encounter for follow-up examination after completed treatment for conditions other than malignant neoplasm: Secondary | ICD-10-CM | POA: Insufficient documentation

## 2010-12-21 LAB — DIFFERENTIAL
Basophils Relative: 0 % (ref 0–1)
Eosinophils Absolute: 0.2 10*3/uL (ref 0.0–0.7)
Eosinophils Relative: 4 % (ref 0–5)
Monocytes Absolute: 0.4 10*3/uL (ref 0.1–1.0)
Monocytes Relative: 8 % (ref 3–12)
Neutrophils Relative %: 47 % (ref 43–77)

## 2010-12-21 LAB — CBC
HCT: 41.4 % (ref 36.0–46.0)
Hemoglobin: 13.6 g/dL (ref 12.0–15.0)
MCH: 26.1 pg (ref 26.0–34.0)
MCHC: 32.9 g/dL (ref 30.0–36.0)

## 2010-12-21 LAB — COMPREHENSIVE METABOLIC PANEL
BUN: 7 mg/dL (ref 6–23)
Calcium: 9.7 mg/dL (ref 8.4–10.5)
Creatinine, Ser: 0.62 mg/dL (ref 0.50–1.10)
GFR calc Af Amer: >90 mL/min (ref >90)
Glucose, Bld: 99 mg/dL (ref 70–99)
Total Protein: 8 g/dL (ref 6.0–8.3)

## 2010-12-21 NOTE — Progress Notes (Signed)
Labs drawn today for cbc/diff,cmp,cea 

## 2010-12-25 ENCOUNTER — Ambulatory Visit (HOSPITAL_COMMUNITY)
Admission: RE | Admit: 2010-12-25 | Discharge: 2010-12-25 | Disposition: A | Discharge status: Home / Self Care | Payer: Medicare Other | Source: Ambulatory Visit | Attending: Oncology | Admitting: Oncology

## 2010-12-25 DIAGNOSIS — Z85038 Personal history of other malignant neoplasm of large intestine: Secondary | ICD-10-CM

## 2010-12-25 DIAGNOSIS — C189 Malignant neoplasm of colon, unspecified: Secondary | ICD-10-CM | POA: Insufficient documentation

## 2010-12-25 DIAGNOSIS — I10 Essential (primary) hypertension: Secondary | ICD-10-CM | POA: Insufficient documentation

## 2010-12-25 MED ORDER — IOHEXOL 300 MG/ML  SOLN
100.0000 mL | Freq: Once | INTRAMUSCULAR | Status: AC | PRN
  Administered 2010-12-25: 100 mL via INTRAVENOUS

## 2010-12-26 ENCOUNTER — Telehealth (HOSPITAL_COMMUNITY): Payer: Self-pay | Admitting: *Deleted

## 2010-12-26 NOTE — Telephone Encounter (Signed)
Message copied by Dennie Maizes on Tue Dec 26, 2010  3:01 PM ------      Message from: Mariel Sleet, ERIC S      Created: Mon Dec 25, 2010  2:53 PM       Negative-call her.

## 2010-12-26 NOTE — Telephone Encounter (Signed)
Message left for pt on answering machine that scan was negative.

## 2011-03-14 ENCOUNTER — Encounter (HOSPITAL_COMMUNITY): Payer: Medicare Other | Attending: Oncology | Admitting: Oncology

## 2011-03-14 VITALS — BP 194/78 | HR 54 | Temp 99.4°F | Ht 64.0 in | Wt 191.2 lb

## 2011-03-14 DIAGNOSIS — I10 Essential (primary) hypertension: Secondary | ICD-10-CM

## 2011-03-14 DIAGNOSIS — E669 Obesity, unspecified: Secondary | ICD-10-CM

## 2011-03-14 DIAGNOSIS — C183 Malignant neoplasm of hepatic flexure: Secondary | ICD-10-CM

## 2011-03-14 DIAGNOSIS — Z85038 Personal history of other malignant neoplasm of large intestine: Secondary | ICD-10-CM

## 2011-03-14 NOTE — Progress Notes (Signed)
This office note has been dictated.

## 2011-03-14 NOTE — Progress Notes (Signed)
CC:   Janice Curtis, M.D. [name], M.D. [name], M.D.  DIAGNOSIS: 1. Advanced stage IIIC adenocarcinoma of the hepatic flexure with     resection on 11/14/2007 by Dr. Lovell Sheehan for a 5.4 cm cancer that was     grade 1.  One of the resection margins was within less than 1/10th     of centimeter and there was perforation of visceral peritoneum into     the pericolonic soft tissues and 2 of 17 nodes were positive.  LVI     was not described.  She was Kras mutation positive.  Given her T4     N1 lesion, she is status post 6 cycles of FOLFOX. 2. Hypertension. 3. History of a stroke and she is on Plavix. 4. Obesity. [name] is now on not only her Plavix but she is also on Verapamil and amlodipine.  She has a blood pressure today which is elevated but she states she gets very nervous coming here and she states that her blood pressure at home is much better. It is usually in the 140-150 range over around 80 and today she is 188/81, that is a repeat; her initial was 195/83, we are going to check her one more time now that the exam is over.  The nurse will check it once I have left the room.  She feels good.  Just does not get out and exercise or do anything physical except during the spring and summer when she works out in the yard but during the winter and fall she is really a couch potato.  She does have [facility] [employer] [facility] She states she stopped going to the [employer] because they dissolved that program, I guess due to costs it sounds like.  Her vital signs, otherwise, very good, just the blood pressures is the issue. Her  weight of course is 191 pounds.  That is pretty stable for her, up a little bit compared to several years ago, and up a lot compared to when she first presented here.  Her breast exam is negative for any masses.  She has no adenopathy in the cervical, supraclavicular, infraclavicular, axillary, or  inguinal areas.  Heart shows a regular rhythm and rate without murmur rub or gallop.  Lungs are clear to auscultation and percussion.  Abdomen is soft, nontender, without hepatosplenomegaly or masses.  Bowel sounds are normal.  She has no peripheral edema.  Will continue do CEAs every 3 months.  We will do a CT scan once a year. Her last CT scan in October was fine.  We will do a CBC and CMET when she comes in 3 months and I will see her in 6 months.  I have encouraged her to really work on her blood pressure and exercise issues.    ______________________________ Ladona Horns. Mariel Sleet, MD ESN/MEDQ  D:  03/14/2011  T:  03/14/2011  Job:  [id number]

## 2011-03-14 NOTE — Progress Notes (Signed)
PT HAD INCOMPLETE TCS OCT 2012 WAS SUPPOSE TO RETURN IN 3 TO 6 MOS. NEEDS OPV E 30 VISIT ASAP TO SCHEDULE TCS.

## 2011-03-14 NOTE — Patient Instructions (Signed)
Janice Curtis  [id number] Jan 16, 1941   [facility]  Discharge Instructions  RECOMMENDATIONS MADE BY THE CONSULTANT AND ANY TEST RESULTS WILL BE SENT TO YOUR REFERRING DOCTOR.   EXAM FINDINGS BY MD TODAY AND SIGNS AND SYMPTOMS TO REPORT TO CLINIC OR PRIMARY MD: you are doing very well.  Will check your CEA today and if anything is abnormal we will call you.   MEDICATIONS PRESCRIBED: none   INSTRUCTIONS GIVEN AND DISCUSSED: Other :  Report changes in bowel habits, blood in stool, shortness of breath.  SPECIAL INSTRUCTIONS/FOLLOW-UP: Lab work Needed every 3 months and Return to Clinic in 6 months.   I acknowledge that I have been informed and understand all the instructions given to me and received a copy. I do not have any more questions at this time, but understand that I may call the Specialty Clinic at [facility] at [phone] during business hours should I have any further questions or need assistance in obtaining follow-up care.    __________________________________________  _____________  __________ Signature of Patient or Authorized Representative            Date                   Time    __________________________________________ Nurse's Signature

## 2011-03-15 ENCOUNTER — Encounter: Payer: Self-pay | Admitting: Gastroenterology

## 2011-03-15 LAB — CEA: CEA: 0.8 ng/mL (ref 0.0–5.0)

## 2011-03-15 NOTE — Progress Notes (Signed)
LMOM for patient to call me back so we could set up her OV.  I mailed her appt card to come in on 1/23 at 2pm to see SF in E30 visit

## 2011-03-28 ENCOUNTER — Ambulatory Visit: Payer: Medicare Other | Admitting: Gastroenterology

## 2011-04-03 ENCOUNTER — Encounter: Payer: Self-pay | Admitting: Gastroenterology

## 2011-04-04 ENCOUNTER — Encounter: Payer: Self-pay | Admitting: Gastroenterology

## 2011-04-04 ENCOUNTER — Ambulatory Visit (INDEPENDENT_AMBULATORY_CARE_PROVIDER_SITE_OTHER): Payer: Medicare Other | Admitting: Gastroenterology

## 2011-04-04 VITALS — BP 160/95 | HR 85 | Temp 98.4°F | Ht 64.0 in | Wt 191.8 lb

## 2011-04-04 DIAGNOSIS — C189 Malignant neoplasm of colon, unspecified: Secondary | ICD-10-CM

## 2011-04-04 DIAGNOSIS — Z85038 Personal history of other malignant neoplasm of large intestine: Secondary | ICD-10-CM

## 2011-04-04 MED ORDER — PEG-KCL-NACL-NASULF-NA ASC-C 100 G PO SOLR: 1.0000 | Freq: Once | ORAL | Status: DC

## 2011-04-04 MED ORDER — LORAZEPAM 0.5 MG PO TABS: ORAL_TABLET | ORAL | Status: DC

## 2011-04-04 NOTE — Progress Notes (Signed)
  Subjective:    Patient ID: Janice Curtis, female    DOB: 12/14/1940, 70 y.o.   MRN: [id number]  PCP: [id number]   HPI PT LAST SEEN OCT 2010 FOR TCS. WAS SUPPOSE TO RETURN BUT DID NOT BECAUSE SHE'S NERVOUS ABOUT THE TEST. SHE HAD AN INCOMPLETE TCS DUE TO POOR BOWEL PREP.  Doing fairly well. Sometimes needs stool softener and sometimes has diarrhea. No blood in stool, or black tarry stool. No nausea or vomiting since chemo. rX FOR 6 MOS. GETS NERVOUS WHEN SHE THINKS ABOUT THE TCS.   Past Medical History  Diagnosis Date  . Colon cancer   . Stroke 12/05/2009  . Hypercholesterolemia   . Hypertension     Past Surgical History  Procedure Date  . Right colectomy 11/14/2007  . Colonoscopy 12/27/08    simple adenoma/inflammatory polyp at anastomosis    No Known Allergies  Current Outpatient Prescriptions  Medication Sig Dispense Refill  . acetaminophen (TYLENOL) 500 MG tablet Take 500 mg by mouth every 6 (six) hours as needed.        . Cholecalciferol (VITAMIN D3) 1000 UNITS CAPS Take 1,000 Units by mouth daily.        . clopidogrel (PLAVIX) 75 MG tablet Take 75 mg by mouth daily. Pt says she cannot afford the Plavix      . Cyanocobalamin (VITAMIN B 12 PO) Take 1 tablet by mouth daily.       . simvastatin (ZOCOR) 20 MG tablet Take 20 mg by mouth at bedtime.        . verapamil (CALAN) 120 MG tablet Take 120 mg by mouth 2 (two) times daily. Taking 2 each morning and 1 at night      . Casanthranol-Docusate Sodium 30-100 MG CAPS Take by mouth daily as needed.        .      .       Family History  Problem Relation Age of Onset  . Heart attack Mother   . Stroke Father        Review of Systems     Objective:   Physical Exam  Vitals reviewed. Constitutional: She is oriented to person, place, and time. She appears well-developed and well-nourished. No distress.  HENT:  Head: Normocephalic and atraumatic.  Mouth/Throat: No oropharyngeal exudate.  Eyes: Pupils are equal, round,  and reactive to light. No scleral icterus.  Neck: Normal range of motion. Neck supple.  Cardiovascular: Normal rate, regular rhythm and normal heart sounds.   Pulmonary/Chest: Effort normal and breath sounds normal. No respiratory distress.  Abdominal: Soft. Bowel sounds are normal. She exhibits no distension. There is no tenderness.  Musculoskeletal: Normal range of motion. She exhibits no edema.  Lymphadenopathy:    She has no cervical adenopathy.  Neurological: She is alert and oriented to person, place, and time.       NO FOCAL DEFICITS   Psychiatric:       ANXIOUS MOOD          Assessment & Plan:   

## 2011-04-04 NOTE — Progress Notes (Signed)
Reminder in epic to have tcs in 5 years °

## 2011-04-04 NOTE — Assessment & Plan Note (Signed)
COLONOSCOPY FEB18. TAKE ATIVAN ON NIGHT PRIOR TO AND MORNING OF PROCEDURE. FOLLOW UP AS NEEDED. DRINK 6 TO 8 CUPS OF WATER DAILY. FOLLOW A HIGH FIBER DIET.  WILL NEED TCS EVERY 5 YEARS.

## 2011-04-04 NOTE — Progress Notes (Signed)
Faxed to PCP

## 2011-04-04 NOTE — Patient Instructions (Addendum)
COLONOSCOPY FEB18. TAKE ATIVAN ON NIGHT PRIOR TO AND MORNING OF PROCEDURE. FOLLOW UP AS NEEDED. DRINK 6 TO 8 CUPS OF WATER DAILY. FOLLOW A HIGH FIBER DIET.  High-Fiber Diet A high-fiber diet changes your normal diet to include more whole grains, legumes, fruits, and vegetables. Changes in the diet involve replacing refined carbohydrates with unrefined foods. The calorie level of the diet is essentially unchanged. The Dietary Reference Intake (recommended amount) for adult males is 38 grams per day. For adult females, it is 25 grams per day. Pregnant and lactating women should consume 28 grams of fiber per day. Fiber is the intact part of a plant that is not broken down during digestion. Functional fiber is fiber that has been isolated from the plant to provide a beneficial effect in the body. PURPOSE  Increase stool bulk.   Ease and regulate bowel movements.   Lower cholesterol.  INDICATIONS THAT YOU NEED MORE FIBER  Constipation and hemorrhoids.   Uncomplicated diverticulosis (intestine condition) and irritable bowel syndrome.   Weight management.   As a protective measure against hardening of the arteries (atherosclerosis), diabetes, and cancer.   GUIDELINES FOR INCREASING FIBER IN THE DIET  Start adding fiber to the diet slowly. A gradual increase of about 5 more grams (2 slices of whole-wheat bread, 2 servings of most fruits or vegetables, or 1 bowl of high-fiber cereal) per day is best. Too rapid an increase in fiber may result in constipation, flatulence, and bloating.   Drink enough water and fluids to keep your urine clear or pale yellow. Water, juice, or caffeine-free drinks are recommended. Not drinking enough fluid may cause constipation.   Eat a variety of high-fiber foods rather than one type of fiber.   Try to increase your intake of fiber through using high-fiber foods rather than fiber pills or supplements that contain small amounts of fiber.   The goal is to  change the types of food eaten. Do not supplement your present diet with high-fiber foods, but replace foods in your present diet.  INCLUDE A VARIETY OF FIBER SOURCES  Replace refined and processed grains with whole grains, canned fruits with fresh fruits, and incorporate other fiber sources. White rice, white breads, and most bakery goods contain little or no fiber.   Brown whole-grain rice, buckwheat oats, and many fruits and vegetables are all good sources of fiber. These include: broccoli, Brussels sprouts, cabbage, cauliflower, beets, sweet potatoes, white potatoes (skin on), carrots, tomatoes, eggplant, squash, berries, fresh fruits, and dried fruits.   Cereals appear to be the richest source of fiber. Cereal fiber is found in whole grains and bran. Bran is the fiber-rich outer coat of cereal grain, which is largely removed in refining. In whole-grain cereals, the bran remains. In breakfast cereals, the largest amount of fiber is found in those with "bran" in their names. The fiber content is sometimes indicated on the label.   You may need to include additional fruits and vegetables each day.   In baking, for 1 cup white flour, you may use the following substitutions:   1 cup whole-wheat flour minus 2 tablespoons.   1/2 cup white flour plus 1/2 cup whole-wheat flour.

## 2011-04-10 ENCOUNTER — Encounter (HOSPITAL_COMMUNITY): Payer: Self-pay | Admitting: Pharmacy Technician

## 2011-04-20 MED ORDER — SODIUM CHLORIDE 0.45 % IV SOLN
Freq: Once | INTRAVENOUS | Status: AC
  Administered 2011-04-23: 10:00:00 via INTRAVENOUS

## 2011-04-23 ENCOUNTER — Other Ambulatory Visit: Payer: Self-pay | Admitting: Gastroenterology

## 2011-04-23 ENCOUNTER — Encounter (HOSPITAL_COMMUNITY): Payer: Self-pay | Admitting: *Deleted

## 2011-04-23 ENCOUNTER — Encounter (HOSPITAL_COMMUNITY)
Admission: RE | Disposition: A | Discharge status: Home / Self Care | Payer: Self-pay | Source: Ambulatory Visit | Attending: Gastroenterology

## 2011-04-23 ENCOUNTER — Ambulatory Visit (HOSPITAL_COMMUNITY)
Admission: RE | Admit: 2011-04-23 | Discharge: 2011-04-23 | Disposition: A | Discharge status: Home / Self Care | Payer: Medicare Other | Source: Ambulatory Visit | Attending: Gastroenterology | Admitting: Gastroenterology

## 2011-04-23 DIAGNOSIS — K621 Rectal polyp: Secondary | ICD-10-CM

## 2011-04-23 DIAGNOSIS — Z79899 Other long term (current) drug therapy: Secondary | ICD-10-CM | POA: Insufficient documentation

## 2011-04-23 DIAGNOSIS — D128 Benign neoplasm of rectum: Secondary | ICD-10-CM | POA: Insufficient documentation

## 2011-04-23 DIAGNOSIS — K648 Other hemorrhoids: Secondary | ICD-10-CM | POA: Insufficient documentation

## 2011-04-23 DIAGNOSIS — Z85038 Personal history of other malignant neoplasm of large intestine: Secondary | ICD-10-CM

## 2011-04-23 DIAGNOSIS — C189 Malignant neoplasm of colon, unspecified: Secondary | ICD-10-CM

## 2011-04-23 DIAGNOSIS — I10 Essential (primary) hypertension: Secondary | ICD-10-CM | POA: Insufficient documentation

## 2011-04-23 DIAGNOSIS — K620 Anal polyp: Secondary | ICD-10-CM

## 2011-04-23 DIAGNOSIS — E7800 Pure hypercholesterolemia, unspecified: Secondary | ICD-10-CM | POA: Insufficient documentation

## 2011-04-23 DIAGNOSIS — Z9049 Acquired absence of other specified parts of digestive tract: Secondary | ICD-10-CM | POA: Insufficient documentation

## 2011-04-23 HISTORY — PX: COLONOSCOPY: SHX5424

## 2011-04-23 HISTORY — DX: Anxiety disorder, unspecified: F41.9

## 2011-04-23 SURGERY — COLONOSCOPY
Anesthesia: Moderate Sedation

## 2011-04-23 MED ORDER — MEPERIDINE HCL 100 MG/ML IJ SOLN
INTRAMUSCULAR | Status: AC
  Filled 2011-04-23: qty 1

## 2011-04-23 MED ORDER — MIDAZOLAM HCL 5 MG/5ML IJ SOLN
INTRAMUSCULAR | Status: AC
  Filled 2011-04-23: qty 10

## 2011-04-23 MED ORDER — MEPERIDINE HCL 100 MG/ML IJ SOLN
INTRAMUSCULAR | Status: DC | PRN
  Administered 2011-04-23: 50 mg via INTRAVENOUS
  Administered 2011-04-23: 25 mg via INTRAVENOUS

## 2011-04-23 MED ORDER — STERILE WATER FOR IRRIGATION IR SOLN
Status: DC | PRN
  Administered 2011-04-23: 11:00:00

## 2011-04-23 MED ORDER — MIDAZOLAM HCL 5 MG/5ML IJ SOLN
INTRAMUSCULAR | Status: DC | PRN
  Administered 2011-04-23: 2 mg via INTRAVENOUS
  Administered 2011-04-23 (×2): 1 mg via INTRAVENOUS

## 2011-04-23 NOTE — Interval H&P Note (Signed)
History and Physical Interval Note:  04/23/2011 11:30 AM  Janice Curtis  has presented today for surgery, with the diagnosis of colon Ca  The various methods of treatment have been discussed with the patient and family. After consideration of risks, benefits and other options for treatment, the patient has consented to  Procedure(s) (LRB): COLONOSCOPY (N/A) as a surgical intervention .  The patients' history has been reviewed, patient examined, no change in status, stable for surgery.  I have reviewed the patients' chart and labs.  Questions were answered to the patient's satisfaction.     [employer]

## 2011-04-23 NOTE — Discharge Instructions (Signed)
You had 2 small polyps removed FROM YOUR RECTUM. You have small internal hemorrhoids.  Next colonoscopy in 5 years. FOLLOW A HIGH FIBER DIET. AVOID ITEMS THAT CAUSE BLOATING. SEE INFO BELOW. YOUR BIOPSY RESULTS SHOULD BE BACK IN 7 DAYS.  Colonoscopy Care After Read the instructions outlined below and refer to this sheet in the next week. These discharge instructions provide you with general information on caring for yourself after you leave the hospital. While your treatment has been planned according to the most current medical practices available, unavoidable complications occasionally occur. If you have any problems or questions after discharge, call DR. Delcenia Inman, [phone].  ACTIVITY  You may resume your regular activity, but move at a slower pace for the next 24 hours.   Take frequent rest periods for the next 24 hours.   Walking will help get rid of the air and reduce the bloated feeling in your belly (abdomen).   No driving for 24 hours (because of the medicine (anesthesia) used during the test).   You may shower.   Do not sign any important legal documents or operate any machinery for 24 hours (because of the anesthesia used during the test).    NUTRITION  Drink plenty of fluids.   You may resume your normal diet as instructed by your doctor.   Begin with a light meal and progress to your normal diet. Heavy or fried foods are harder to digest and may make you feel sick to your stomach (nauseated).   Avoid alcoholic beverages for 24 hours or as instructed.    MEDICATIONS  You may resume your normal medications.   WHAT YOU CAN EXPECT TODAY  Some feelings of bloating in the abdomen.   Passage of more gas than usual.   Spotting of blood in your stool or on the toilet paper  .  IF YOU HAD POLYPS REMOVED DURING THE COLONOSCOPY:  Eat a soft diet IF YOU HAVE NAUSEA, BLOATING, ABDOMINAL PAIN, OR VOMITING.    FINDING OUT THE RESULTS OF YOUR TEST Not all test  results are available during your visit. DR. Darrick Penna WILL CALL YOU WITHIN 7 DAYS OF YOUR PROCEDUE WITH YOUR RESULTS. Do not assume everything is normal if you have not heard from DR. Nakia Koble IN ONE WEEK, CALL HER OFFICE AT [phone].  SEEK IMMEDIATE MEDICAL ATTENTION AND CALL THE OFFICE: [phone] IF:  You have more than a spotting of blood in your stool.   Your belly is swollen (abdominal distention).   You are nauseated or vomiting.   You have a temperature over 101F.   You have abdominal pain or discomfort that is severe or gets worse throughout the day.  Polyps, Colon  A polyp is extra tissue that grows inside your body. Colon polyps grow in the large intestine. The large intestine, also called the colon, is part of your digestive system. It is a long, hollow tube at the end of your digestive tract where your body makes and stores stool. Most polyps are not dangerous. They are benign. This means they are not cancerous. But over time, some types of polyps can turn into cancer. Polyps that are smaller than a pea are usually not harmful. But larger polyps could someday become or may already be cancerous. To be safe, doctors remove all polyps and test them.   WHO GETS POLYPS? Anyone can get polyps, but certain people are more likely than others. You may have a greater chance of getting polyps if:  You are over  50.   You have had polyps before.   Someone in your family has had polyps.   Someone in your family has had cancer of the large intestine.   Find out if someone in your family has had polyps. You may also be more likely to get polyps if you:   Eat a lot of fatty foods   Smoke   Drink alcohol   Do not exercise  Eat too much   TREATMENT  The caregiver will remove the polyp during sigmoidoscopy or colonoscopy.  PREVENTION There is not one sure way to prevent polyps. You might be able to lower your risk of getting them if you:  Eat more fruits and vegetables and less  fatty food.   Do not smoke.   Avoid alcohol.   Exercise every day.   Lose weight if you are overweight.   Eating more calcium and folate can also lower your risk of getting polyps. Some foods that are rich in calcium are milk, cheese, and broccoli. Some foods that are rich in folate are chickpeas, kidney beans, and spinach.   High-Fiber Diet A high-fiber diet changes your normal diet to include more whole grains, legumes, fruits, and vegetables. Changes in the diet involve replacing refined carbohydrates with unrefined foods. The calorie level of the diet is essentially unchanged. The Dietary Reference Intake (recommended amount) for adult males is 38 grams per day. For adult females, it is 25 grams per day. Pregnant and lactating women should consume 28 grams of fiber per day. Fiber is the intact part of a plant that is not broken down during digestion. Functional fiber is fiber that has been isolated from the plant to provide a beneficial effect in the body. PURPOSE  Increase stool bulk.   Ease and regulate bowel movements.   Lower cholesterol.  INDICATIONS THAT YOU NEED MORE FIBER  Constipation and hemorrhoids.   Uncomplicated diverticulosis (intestine condition) and irritable bowel syndrome.   Weight management.   As a protective measure against hardening of the arteries (atherosclerosis), diabetes, and cancer.   GUIDELINES FOR INCREASING FIBER IN THE DIET  Start adding fiber to the diet slowly. A gradual increase of about 5 more grams (2 slices of whole-wheat bread, 2 servings of most fruits or vegetables, or 1 bowl of high-fiber cereal) per day is best. Too rapid an increase in fiber may result in constipation, flatulence, and bloating.   Drink enough water and fluids to keep your urine clear or pale yellow. Water, juice, or caffeine-free drinks are recommended. Not drinking enough fluid may cause constipation.   Eat a variety of high-fiber foods rather than one type of  fiber.   Try to increase your intake of fiber through using high-fiber foods rather than fiber pills or supplements that contain small amounts of fiber.   The goal is to change the types of food eaten. Do not supplement your present diet with high-fiber foods, but replace foods in your present diet.  INCLUDE A VARIETY OF FIBER SOURCES  Replace refined and processed grains with whole grains, canned fruits with fresh fruits, and incorporate other fiber sources. White rice, white breads, and most bakery goods contain little or no fiber.   Brown whole-grain rice, buckwheat oats, and many fruits and vegetables are all good sources of fiber. These include: broccoli, Brussels sprouts, cabbage, cauliflower, beets, sweet potatoes, white potatoes (skin on), carrots, tomatoes, eggplant, squash, berries, fresh fruits, and dried fruits.   Cereals appear to be the richest source  of fiber. Cereal fiber is found in whole grains and bran. Bran is the fiber-rich outer coat of cereal grain, which is largely removed in refining. In whole-grain cereals, the bran remains. In breakfast cereals, the largest amount of fiber is found in those with "bran" in their names. The fiber content is sometimes indicated on the label.   You may need to include additional fruits and vegetables each day.   In baking, for 1 cup white flour, you may use the following substitutions:   1 cup whole-wheat flour minus 2 tablespoons.   1/2 cup white flour plus 1/2 cup whole-wheat flour.   Hemorrhoids Hemorrhoids are dilated (enlarged) veins around the rectum. Sometimes clots will form in the veins. This makes them swollen and painful. These are called thrombosed hemorrhoids. Causes of hemorrhoids include:  Constipation.   Straining to have a bowel movement.   HEAVY LIFTING HOME CARE INSTRUCTIONS  Eat a well balanced diet and drink 6 to 8 glasses of water every day to avoid constipation. You may also use a bulk laxative.   Avoid  straining to have bowel movements.   Keep anal area dry and clean.   Do not use a donut shaped pillow or sit on the toilet for long periods. This increases blood pooling and pain.   Move your bowels when your body has the urge; this will require less straining and will decrease pain and pressure.

## 2011-04-23 NOTE — H&P (View-Only) (Signed)
  Subjective:    Patient ID: Janice Curtis, female    DOB: 1940-12-13, 71 y.o.   MRN: [id number]  PCP: [facility]   HPI PT LAST SEEN OCT 2010 FOR TCS. WAS SUPPOSE TO RETURN BUT DID NOT BECAUSE SHE'S NERVOUS ABOUT THE TEST. SHE HAD AN INCOMPLETE TCS DUE TO POOR BOWEL PREP.  Doing fairly well. Sometimes needs stool softener and sometimes has diarrhea. No blood in stool, or black tarry stool. No nausea or vomiting since chemo. rX FOR 6 MOS. GETS NERVOUS WHEN SHE THINKS ABOUT THE TCS.   Past Medical History  Diagnosis Date  . Colon cancer   . Stroke 12/05/2009  . Hypercholesterolemia   . Hypertension     Past Surgical History  Procedure Date  . Right colectomy 11/14/2007  . Colonoscopy 12/27/08    simple adenoma/inflammatory polyp at anastomosis    No Known Allergies  Current Outpatient Prescriptions  Medication Sig Dispense Refill  . acetaminophen (TYLENOL) 500 MG tablet Take 500 mg by mouth every 6 (six) hours as needed.        . Cholecalciferol (VITAMIN D3) 1000 UNITS CAPS Take 1,000 Units by mouth daily.        . clopidogrel (PLAVIX) 75 MG tablet Take 75 mg by mouth daily. Pt says she cannot afford the Plavix      . Cyanocobalamin (VITAMIN B 12 PO) Take 1 tablet by mouth daily.       . simvastatin (ZOCOR) 20 MG tablet Take 20 mg by mouth at bedtime.        . verapamil (CALAN) 120 MG tablet Take 120 mg by mouth 2 (two) times daily. Taking 2 each morning and 1 at night      . Casanthranol-Docusate Sodium 30-100 MG CAPS Take by mouth daily as needed.        .      .       Family History  Problem Relation Age of Onset  . Heart attack Mother   . Stroke Father        Review of Systems     Objective:   Physical Exam  Vitals reviewed. Constitutional: She is oriented to person, place, and time. She appears well-developed and well-nourished. No distress.  HENT:  Head: Normocephalic and atraumatic.  Mouth/Throat: No oropharyngeal exudate.  Eyes: Pupils are equal, round,  and reactive to light. No scleral icterus.  Neck: Normal range of motion. Neck supple.  Cardiovascular: Normal rate, regular rhythm and normal heart sounds.   Pulmonary/Chest: Effort normal and breath sounds normal. No respiratory distress.  Abdominal: Soft. Bowel sounds are normal. She exhibits no distension. There is no tenderness.  Musculoskeletal: Normal range of motion. She exhibits no edema.  Lymphadenopathy:    She has no cervical adenopathy.  Neurological: She is alert and oriented to person, place, and time.       NO FOCAL DEFICITS   Psychiatric:       ANXIOUS MOOD          Assessment & Plan:

## 2011-04-25 NOTE — Op Note (Signed)
[facility] [address] [place], [place]  [address]  COLONOSCOPY PROCEDURE REPORT  PATIENT:  Janice Curtis, Janice Curtis  MR#:  [id number] BIRTHDATE:  [birth year], 70 yrs. old  GENDER:  female  ENDOSCOPIST:  [name], MD REF. BY:  Karleen Hampshire, M.D. ASSISTANT:  PROCEDURE DATE:  04/23/2011 PROCEDURE:  Colonoscopy with biopsy  INDICATIONS:  COLON CA/R HEMICOLECTOMY SEP 2009 LAST TCS 2010-FAIR PREP IN RIGHT COLON  MEDICATIONS:   Demerol 75 mg IV, Versed 4 mg IV  DESCRIPTION OF PROCEDURE:    Physical exam was performed. Informed consent was obtained from the patient after explaining the benefits, risks, and alternatives to procedure.  The patient was connected to monitor and placed in left lateral position. Continuous oxygen was provided by nasal cannula and IV medicine administered through an indwelling cannula.  After administration of sedation and rectal exam, the patient's rectum was intubated and the EC-[id number] ([id number]) colonoscope was advanced under direct visualization to the NEO-TERMINAL ILEUM.  The scope was removed slowly by carefully examining the color, texture, anatomy, and integrity mucosa on the way out.  The patient was recovered in endoscopy and discharged home in satisfactory condition. <<PROCEDUREIMAGES>>  FINDINGS:  There were TWO polyps (2-3 MM) identified and removed. in the rectum VIA COLD FORCEPS.  MODERATE Internal Hemorrhoids were found.  PREP QUALITY: GOOD CECAL W/D TIME:    9 minutes  COMPLICATIONS:    None  ENDOSCOPIC IMPRESSION: 1) Polyps, multiple in the rectum 2) Internal hemorrhoids  RECOMMENDATIONS: TCS IN 5 YEARS HIGH FIBER DIET  REPEAT EXAM:  No  ______________________________ [name], MD  CC:  Karleen Hampshire, M.D.  n. eSIGNED:   Jelesa Mangini at 04/25/2011 07:58 AM  Renne Crigler, [phone]

## 2011-04-27 ENCOUNTER — Telehealth: Payer: Self-pay | Admitting: Gastroenterology

## 2011-04-27 NOTE — Telephone Encounter (Signed)
Please call pt. She had HYPERPLASTIC POLYPS removed from her colon. TCS in 5 years. High fiber diet.

## 2011-04-30 NOTE — Telephone Encounter (Signed)
Pt informed

## 2011-05-01 ENCOUNTER — Encounter (HOSPITAL_COMMUNITY): Payer: Self-pay | Admitting: Gastroenterology

## 2011-05-02 NOTE — Telephone Encounter (Signed)
Results Cc to PCP  

## 2011-06-06 ENCOUNTER — Encounter (HOSPITAL_COMMUNITY): Payer: Medicare Other | Attending: Oncology

## 2011-06-06 DIAGNOSIS — I10 Essential (primary) hypertension: Secondary | ICD-10-CM

## 2011-06-06 DIAGNOSIS — Z85038 Personal history of other malignant neoplasm of large intestine: Secondary | ICD-10-CM

## 2011-06-06 LAB — COMPREHENSIVE METABOLIC PANEL
Albumin: 3.9 g/dL (ref 3.5–5.2)
Alkaline Phosphatase: 81 U/L (ref 39–117)
BUN: 10 mg/dL (ref 6–23)
Chloride: 99 mEq/L (ref 96–112)
Creatinine, Ser: 0.66 mg/dL (ref 0.50–1.10)
GFR calc Af Amer: >90 mL/min (ref >90)
Glucose, Bld: 118 mg/dL — ABNORMAL HIGH (ref 70–99)
Potassium: 3.6 mEq/L (ref 3.5–5.1)
Total Bilirubin: 0.4 mg/dL (ref 0.3–1.2)
Total Protein: 7.8 g/dL (ref 6.0–8.3)

## 2011-06-06 LAB — CBC
HCT: 40.5 % (ref 36.0–46.0)
MCH: 26.5 pg (ref 26.0–34.0)
MCV: 80.7 fL (ref 78.0–100.0)
Platelets: 182 10*3/uL (ref 150–400)
RDW: 13.4 % (ref 11.5–15.5)

## 2011-06-25 NOTE — Progress Notes (Signed)
Labs drawn

## 2011-07-25 ENCOUNTER — Other Ambulatory Visit (HOSPITAL_COMMUNITY): Payer: Self-pay | Admitting: Physician Assistant

## 2011-07-25 DIAGNOSIS — Z139 Encounter for screening, unspecified: Secondary | ICD-10-CM

## 2011-08-06 ENCOUNTER — Other Ambulatory Visit (HOSPITAL_COMMUNITY): Payer: Medicare Other

## 2011-08-29 ENCOUNTER — Encounter (HOSPITAL_COMMUNITY): Payer: Medicare Other | Attending: Oncology

## 2011-08-29 DIAGNOSIS — Z85038 Personal history of other malignant neoplasm of large intestine: Secondary | ICD-10-CM | POA: Insufficient documentation

## 2011-08-30 LAB — CEA: CEA: 1 ng/mL (ref 0.0–5.0)

## 2011-09-12 ENCOUNTER — Encounter (HOSPITAL_COMMUNITY): Payer: Medicare Other | Attending: Oncology | Admitting: Oncology

## 2011-09-12 ENCOUNTER — Telehealth (HOSPITAL_COMMUNITY): Payer: Self-pay | Admitting: *Deleted

## 2011-09-12 VITALS — BP 192/79 | HR 56 | Temp 98.1°F | Ht 64.0 in | Wt 188.0 lb

## 2011-09-12 DIAGNOSIS — Z85038 Personal history of other malignant neoplasm of large intestine: Secondary | ICD-10-CM

## 2011-09-12 DIAGNOSIS — C183 Malignant neoplasm of hepatic flexure: Secondary | ICD-10-CM

## 2011-09-12 NOTE — Progress Notes (Signed)
Problem #1 stage III C. adenocarcinoma of the hepatic flexure with surgery on 11/14/2007 by Dr. Franky Macho for a grade 1 5.4 cm cancer resection margins within less than 1/10 cm with perforation of the visceral peritoneum and depth of invasion was through the muscularis propria into the pericolonic soft tissues with 2 of 17 positive nodes. She was K-ras positive giving her T4 N1 cancer. She is status post adjuvant chemotherapy for 6 cycles consisting of oxaliplatinum with 5-FU and leucovorin in a FOLFOX regimen. She is doing well at this point in time. Unfortunately she's to heavy for her height she is 188 pounds today on a 5 foot 4 inch frame she does not have any bowel symptoms other than increased amounts of gas which eats fiber rich foods by Dr Jens Som has recommended to her a fiber rich diet after she had colonoscopy in February 2013 she is following the diet somewhat.  She denies blood in her stools she occasionally has abdominal twinge which is not new or different. She should have a CT scan of her abdomen and pelvis in the fall of this year. Her CEAs have been fine and lab work in April was also quite good. We will continue to do CEAs every 3 months for another year from the time of her surgery then we can change every 6 months with visits here every 12 months.  Her physical exam shows her vital signs are recorded in the chart. She has no lymphadenopathy. Breast exam is negative for masses. Lungs are clear. Heart shows a regular rhythm and rate without murmur rub or gallop her abdomen is obese but without obvious organomegaly bowel sounds are diminished. She has no peripheral edema  Will see her back in 6 months and I will set up a CAT scans in October or November

## 2011-09-12 NOTE — Patient Instructions (Signed)
[facility]  Discharge Instructions Janice Curtis  [id number] 1940-09-20 Dr. Glenford Peers RECOMMENDATIONS MADE BY THE CONSULTANT AND ANY TEST RESULTS WILL BE SENT TO YOUR REFERRING DOCTOR.   EXAM FINDINGS BY MD TODAY AND SIGNS AND SYMPTOMS TO REPORT TO CLINIC OR PRIMARY MD:  Exam good Report any new abdominal pain that is persistent and bowel changes SPECIAL INSTRUCTIONS/FOLLOW-UP: Labs every 3 months See [place] back in 6 months   I acknowledge that I have been informed and understand all the instructions given to me and received a copy. I do not have any more questions at this time, but understand that I may call the [facility] at [facility] at [phone] during business hours should I have any further questions or need assistance in obtaining follow-up care.    __________________________________________  _____________  __________ Signature of Patient or Authorized Representative            Date                   Time    __________________________________________ Nurse's Signature

## 2011-09-12 NOTE — Telephone Encounter (Signed)
Pt notified that she will need ct scan in Nov. And that our scheduler will call her with appt. She was notified that she will need to pick up her prep to drink prior to the scans when she is here for labs in September.

## 2011-10-25 ENCOUNTER — Other Ambulatory Visit (HOSPITAL_COMMUNITY): Payer: Self-pay | Admitting: Family Medicine

## 2011-10-25 DIAGNOSIS — Z139 Encounter for screening, unspecified: Secondary | ICD-10-CM

## 2011-10-29 ENCOUNTER — Ambulatory Visit (HOSPITAL_COMMUNITY)
Admission: RE | Admit: 2011-10-29 | Discharge: 2011-10-29 | Disposition: A | Discharge status: Home / Self Care | Payer: Medicare Other | Source: Ambulatory Visit | Attending: Family Medicine | Admitting: Family Medicine

## 2011-10-29 DIAGNOSIS — Z139 Encounter for screening, unspecified: Secondary | ICD-10-CM

## 2011-10-29 DIAGNOSIS — Z1231 Encounter for screening mammogram for malignant neoplasm of breast: Secondary | ICD-10-CM | POA: Insufficient documentation

## 2011-11-11 ENCOUNTER — Encounter (HOSPITAL_COMMUNITY): Payer: Self-pay

## 2011-11-11 ENCOUNTER — Emergency Department (HOSPITAL_COMMUNITY): Payer: Medicare Other

## 2011-11-11 ENCOUNTER — Emergency Department (HOSPITAL_COMMUNITY)
Admission: EM | Admit: 2011-11-11 | Discharge: 2011-11-11 | Disposition: A | Discharge status: Home / Self Care | Payer: Medicare Other | Attending: Emergency Medicine | Admitting: Emergency Medicine

## 2011-11-11 DIAGNOSIS — E7800 Pure hypercholesterolemia, unspecified: Secondary | ICD-10-CM | POA: Insufficient documentation

## 2011-11-11 DIAGNOSIS — Z85038 Personal history of other malignant neoplasm of large intestine: Secondary | ICD-10-CM | POA: Insufficient documentation

## 2011-11-11 DIAGNOSIS — I10 Essential (primary) hypertension: Secondary | ICD-10-CM | POA: Insufficient documentation

## 2011-11-11 DIAGNOSIS — R918 Other nonspecific abnormal finding of lung field: Secondary | ICD-10-CM | POA: Insufficient documentation

## 2011-11-11 DIAGNOSIS — R1031 Right lower quadrant pain: Secondary | ICD-10-CM | POA: Insufficient documentation

## 2011-11-11 DIAGNOSIS — Z8673 Personal history of transient ischemic attack (TIA), and cerebral infarction without residual deficits: Secondary | ICD-10-CM | POA: Insufficient documentation

## 2011-11-11 DIAGNOSIS — N201 Calculus of ureter: Secondary | ICD-10-CM

## 2011-11-11 DIAGNOSIS — R109 Unspecified abdominal pain: Secondary | ICD-10-CM | POA: Insufficient documentation

## 2011-11-11 DIAGNOSIS — Z79899 Other long term (current) drug therapy: Secondary | ICD-10-CM | POA: Insufficient documentation

## 2011-11-11 LAB — COMPREHENSIVE METABOLIC PANEL
ALT: 12 U/L (ref 0–35)
AST: 18 U/L (ref 0–37)
Albumin: 3.8 g/dL (ref 3.5–5.2)
Alkaline Phosphatase: 76 U/L (ref 39–117)
BUN: 10 mg/dL (ref 6–23)
Chloride: 98 mEq/L (ref 96–112)
Potassium: 3.3 mEq/L — ABNORMAL LOW (ref 3.5–5.1)
Sodium: 133 mEq/L — ABNORMAL LOW (ref 135–145)
Total Bilirubin: 0.5 mg/dL (ref 0.3–1.2)
Total Protein: 7.4 g/dL (ref 6.0–8.3)

## 2011-11-11 LAB — CBC
HCT: 40.1 % (ref 36.0–46.0)
MCHC: 32.9 g/dL (ref 30.0–36.0)
RDW: 13.9 % (ref 11.5–15.5)
WBC: 4.3 10*3/uL (ref 4.0–10.5)

## 2011-11-11 LAB — URINALYSIS, ROUTINE W REFLEX MICROSCOPIC
Bilirubin Urine: NEGATIVE
Glucose, UA: NEGATIVE mg/dL
Hgb urine dipstick: NEGATIVE
Ketones, ur: NEGATIVE mg/dL
Nitrite: NEGATIVE
Specific Gravity, Urine: 1.01 (ref 1.005–1.030)
pH: 6.5 (ref 5.0–8.0)

## 2011-11-11 MED ORDER — MORPHINE SULFATE 4 MG/ML IJ SOLN
6.0000 mg | Freq: Once | INTRAMUSCULAR | Status: AC
  Administered 2011-11-11: 6 mg via INTRAVENOUS
  Filled 2011-11-11: qty 2

## 2011-11-11 MED ORDER — ONDANSETRON 8 MG PO TBDP: 8.0000 mg | ORAL_TABLET | Freq: Three times a day (TID) | ORAL | Status: AC | PRN

## 2011-11-11 MED ORDER — IBUPROFEN 600 MG PO TABS: 600.0000 mg | ORAL_TABLET | Freq: Three times a day (TID) | ORAL | Status: AC | PRN

## 2011-11-11 MED ORDER — MORPHINE SULFATE 4 MG/ML IJ SOLN
6.0000 mg | Freq: Once | INTRAMUSCULAR | Status: AC
  Administered 2011-11-11: 6 mg via INTRAVENOUS
  Filled 2011-11-11 (×2): qty 1

## 2011-11-11 MED ORDER — ONDANSETRON HCL 4 MG/2ML IJ SOLN
4.0000 mg | Freq: Once | INTRAMUSCULAR | Status: AC
  Administered 2011-11-11: 4 mg via INTRAVENOUS
  Filled 2011-11-11: qty 2

## 2011-11-11 MED ORDER — SODIUM CHLORIDE 0.9 % IV SOLN
1000.0000 mL | INTRAVENOUS | Status: DC
  Administered 2011-11-11: 1000 mL via INTRAVENOUS

## 2011-11-11 MED ORDER — KETOROLAC TROMETHAMINE 30 MG/ML IJ SOLN
30.0000 mg | Freq: Once | INTRAMUSCULAR | Status: AC
  Administered 2011-11-11: 30 mg via INTRAVENOUS
  Filled 2011-11-11: qty 1

## 2011-11-11 MED ORDER — OXYCODONE-ACETAMINOPHEN 5-325 MG PO TABS: 1.0000 | ORAL_TABLET | ORAL | Status: AC | PRN

## 2011-11-11 MED ORDER — SODIUM CHLORIDE 0.9 % IV SOLN
1000.0000 mL | Freq: Once | INTRAVENOUS | Status: AC
  Administered 2011-11-11: 1000 mL via INTRAVENOUS

## 2011-11-11 NOTE — ED Notes (Signed)
RN at bedside

## 2011-11-11 NOTE — ED Notes (Signed)
Patient states she is still having pain in her side. RN aware.

## 2011-11-11 NOTE — ED Notes (Signed)
Patient transported to CT 

## 2011-11-11 NOTE — ED Notes (Signed)
Patient incontinent of urine. States due to nausea. Patient provided new gown. Linens changed.

## 2011-11-11 NOTE — ED Notes (Signed)
Dr. Campos at bedside   

## 2011-11-11 NOTE — ED Notes (Signed)
Pt reports woke up approx an hour ago with severe rlq pain.  Reports had small BM but pain didn't go away.  Denies n/v/d.   Says became diaphoretic.   Denies any pain or burning with urination.

## 2011-11-11 NOTE — ED Notes (Signed)
Family at bedside. 

## 2011-11-11 NOTE — ED Provider Notes (Signed)
History    This chart was scribed for Janice Co, MD by Albertha Ghee Rifaie. This patient was seen in room APA09/APA09 and the patient's care was started at 8:42 AM.  CSN: [id number]  Arrival date & time 11/11/11  0836   First MD Initiated Contact with Patient 11/11/11 [phone]      Chief Complaint  Patient presents with  . Abdominal Pain     The history is provided by the patient. No language interpreter was used.    Janice Curtis is a 71 y.o. female who presents to the Emergency Department complaining of constant sharp RLQ pain that radiates to her right flank which started an hour ago while having a BM with associated chills, nausea, and diaphoresis. She reports that the BM was normal but the pain became worse afterwards. Pt also reports that pain is aggravated with lying down. She states that she has experienced prior episodes of milder pain attributed to the urge to having a BM. She denies dysuria or any other urinary symptoms, fever, diarrhea, and emesis. She also  having any other illness currently. The pt has history of CVA, Colon CA, and anxiety. Pt denies smoking and alcohol use.    Past Medical History  Diagnosis Date  . Colon cancer   . Stroke 12/05/2009  . Hypercholesterolemia   . Hypertension   . Anxiety     Past Surgical History  Procedure Date  . Right colectomy 11/14/2007  . Colonoscopy 12/27/08    simple adenoma/inflammatory polyp at anastomosis  . Colonoscopy 04/23/2011    Procedure: COLONOSCOPY;  Surgeon: Arlyce Harman, MD;  Location: [facility];  Service: Endoscopy;  Laterality: N/A;  10:00    Family History  Problem Relation Age of Onset  . Heart attack Mother   . Stroke Father     History  Substance Use Topics  . Smoking status: Never Smoker   . Smokeless tobacco: Not on file  . Alcohol Use: No    No OB history provided   Review of Systems  A complete 10 system review of systems was obtained and all systems are negative except as noted  in the HPI and PMH.    Allergies  Review of patient's allergies indicates no known allergies.  Home Medications   Current Outpatient Rx  Name Route Sig Dispense Refill  . ACETAMINOPHEN 500 MG PO TABS Oral Take 500 mg by mouth every 6 (six) hours as needed. For pain    . CASANTHRANOL-DOCUSATE SODIUM 30-100 MG PO CAPS Oral Take 1 tablet by mouth daily as needed. For constipation    . VITAMIN D3 1000 UNITS PO CAPS Oral Take 1,000 Units by mouth daily.      Marland Kitchen CLOPIDOGREL BISULFATE 75 MG PO TABS Oral Take 75 mg by mouth daily.     Marland Kitchen VITAMIN B 12 PO Oral Take 1 tablet by mouth daily.     Marland Kitchen LORAZEPAM 0.5 MG PO TABS Oral Take 0.5-1 mg by mouth once. prior to COLONOSCOPY.    Marland Kitchen SIMVASTATIN 20 MG PO TABS Oral Take 20 mg by mouth at bedtime.      Marland Kitchen VERAPAMIL HCL 120 MG PO TABS Oral Take 120-240 mg by mouth 2 (two) times daily. Taking 240 mg each morning and 120 mg at night      Triage Vitals: Ht 5\' 3"  (1.6 m)  Wt 180 lb (81.647 kg)  BMI 31.89 kg/m2  Physical Exam  Nursing note and vitals reviewed. Constitutional: She is  oriented to person, place, and time. She appears well-developed and well-nourished. No distress.  HENT:  Head: Normocephalic and atraumatic.  Eyes: EOM are normal.  Neck: Normal range of motion.  Cardiovascular: Normal rate, regular rhythm and normal heart sounds.   Pulmonary/Chest: Effort normal and breath sounds normal.  Abdominal: Soft. She exhibits no distension. There is tenderness. There is no rebound and no guarding.       RLQ tenderness   Musculoskeletal: Normal range of motion.  Neurological: She is alert and oriented to person, place, and time.  Skin: Skin is warm and dry.  Psychiatric: She has a normal mood and affect. Judgment normal.     ED Course  Procedures (including critical care time)  DIAGNOSTIC STUDIES:  Oxygen Saturation is 97% on room air, adequate by my interpretation.    COORDINATION OF CARE:  9:00 AM Discussed treatment plan with pt at  bedside and pt agreed to plan a CT scan of abdomin, (Zofran and Morphine), and IV fluids   10:30 AM Pt rechecked and still reports pain although she rates it mild currently. Will order more pain med.   11:09 AM pt rechecked and feels improved inform pt about CTscan shoiwng 3 mm kidney stone, dicussed dicharge plans and pt agrees to plan.    Labs Reviewed  CBC  COMPREHENSIVE METABOLIC PANEL  LIPASE, BLOOD   Ct Abdomen Pelvis Wo Contrast  11/11/2011  *RADIOLOGY REPORT*  Clinical Data: Right-sided abdominal pain radiating to right flank.  CT ABDOMEN AND PELVIS WITHOUT CONTRAST  Technique:  Multidetector CT imaging of the abdomen and pelvis was performed following the standard protocol without intravenous contrast.  Comparison: CT abdomen pelvis with contrast 12/25/2010  Findings:  Lung bases:  5 mm nodule medial right lower lobe on image #13 of the lung windows.  6 mm ground-glass nodule lateral right lower lobe on image #6 of the lung windows.  These nodules were not present at the lung bases on prior CT of 2012.  3 mm obstructing stone at the right ureterovesicle junction.  This stone results in moderate right hydroureteronephrosis and mild right perinephric stranding.  No intrarenal calculi identified in the right kidney.  The right kidney is within normal limits in contour, and is slightly more prominent in size compared to priors, secondary to obstruction.  The left kidney is normal.  No left renal calculi.  The left ureter is normal.  Urinary bladder is normal in appearance.  Stable to slightly smaller approximately 12 x 8 mm low density lesion in the spleen with some peripheral calcification, unchanged since a CT of 2010.  Spleen is normal in size.  Stable 13 x 7 mm low density lesion in the left hepatic lobe.  No new or suspicious findings in the liver.  T  he gallbladder, pancreas, common bile duct, adrenal glands are within normal limits.  Postsurgical changes of right hemicolectomy.  Small bowel  and colon are normal in caliber.  The stomach is decompressed.  Negative for lymphadenopathy in the abdomen or pelvis. Abdominal aorta is normal in caliber and demonstrates heavy atherosclerotic calcification. Atherosclerotic calcification of the common iliac and internal iliac artery branches bilaterally.  The uterus and adnexa are within normal limits.  Very small umbilical hernia contains fat only.  Degenerative disc disease at L5-S1.  No acute or suspicious bony abnormality.  Sclerosis about the sacroiliac joints bilaterally chronic.  IMPRESSION:  1.  3 mm right ureterovesical junction stone results in moderate right hydroureteronephrosis. 2.  Negative  for intrarenal calculi. 3.  Two new right lower lobe pulmonary nodules, one appears solid, and one has a ground-glass appearance.  If the patient is at high risk for bronchogenic carcinoma, follow-up chest CT at 6-12 months is recommended.  If the patient is at low risk for bronchogenic carcinoma, follow-up chest CT at 12 months is recommended.  This recommendation follows the consensus statement: Guidelines for Management of Small Pulmonary Nodules Detected on CT Scans: A Statement from the Fleischner Society as published in Radiology 2005; 237:395-400.  4.  Right hemicolectomy. 5.  Atherosclerosis of the aortoiliac vasculature without aneurysm. 6.  Stable benign appearing lesions in the liver and spleen as described above.   Original Report Authenticated By: Britta Mccreedy, M.D.     I personally reviewed the imaging tests through PACS system  I reviewed available ER/hospitalization records thought the EMR   1. Right ureteral stone   2. Pulmonary Nodules    MDM  11:17 AM The patient feels much better at this time.  She does have evidence of her right ureteral stone.  She has no signs of infection.  Pain is controlled.  Urology followup.  She does have an abnormal finding in the right lower lobe suggesting 2 new pulmonary nodules.  She does not smoke  cigarettes and therefore she will need followup CT scan in 6 months to reevaluate her pulmonary nodules.  The patient and her family have been updated to this.  She'll followup with her primary care physician regarding this.    I personally performed the services described in this documentation, which was scribed in my presence. The recorded information has been reviewed and considered.      Janice Co, MD 11/11/11 1118

## 2011-11-11 NOTE — ED Notes (Signed)
Urine checked done by accident. RN was made aware and put in new order.

## 2011-11-11 NOTE — ED Notes (Signed)
Patient was given water to drink per RN approval.

## 2011-11-13 ENCOUNTER — Encounter (HOSPITAL_COMMUNITY): Payer: Medicare Other | Attending: Oncology

## 2011-11-13 DIAGNOSIS — C183 Malignant neoplasm of hepatic flexure: Secondary | ICD-10-CM

## 2011-11-13 DIAGNOSIS — Z85038 Personal history of other malignant neoplasm of large intestine: Secondary | ICD-10-CM | POA: Insufficient documentation

## 2011-11-13 LAB — CEA: CEA: 1.1 ng/mL (ref 0.0–5.0)

## 2011-11-13 NOTE — Progress Notes (Signed)
Labs drawn today for cea 

## 2011-11-14 ENCOUNTER — Other Ambulatory Visit (HOSPITAL_COMMUNITY): Payer: Self-pay | Admitting: Oncology

## 2011-11-21 ENCOUNTER — Encounter (HOSPITAL_COMMUNITY): Payer: Medicare Other

## 2012-01-07 ENCOUNTER — Other Ambulatory Visit (HOSPITAL_COMMUNITY): Payer: Self-pay

## 2012-01-07 ENCOUNTER — Ambulatory Visit (HOSPITAL_COMMUNITY)
Admission: RE | Admit: 2012-01-07 | Discharge: 2012-01-07 | Disposition: A | Discharge status: Home / Self Care | Payer: Medicare Other | Source: Ambulatory Visit | Attending: Oncology | Admitting: Oncology

## 2012-01-07 DIAGNOSIS — Z85038 Personal history of other malignant neoplasm of large intestine: Secondary | ICD-10-CM | POA: Insufficient documentation

## 2012-01-07 DIAGNOSIS — Z9221 Personal history of antineoplastic chemotherapy: Secondary | ICD-10-CM | POA: Insufficient documentation

## 2012-01-07 DIAGNOSIS — Z09 Encounter for follow-up examination after completed treatment for conditions other than malignant neoplasm: Secondary | ICD-10-CM | POA: Insufficient documentation

## 2012-01-07 DIAGNOSIS — Z9049 Acquired absence of other specified parts of digestive tract: Secondary | ICD-10-CM | POA: Insufficient documentation

## 2012-01-07 LAB — POCT I-STAT, CHEM 8
BUN: 5 mg/dL — ABNORMAL LOW (ref 6–23)
Calcium, Ion: 1.13 mmol/L (ref 1.13–1.30)
Chloride: 103 mEq/L (ref 96–112)

## 2012-01-07 MED ORDER — IOHEXOL 300 MG/ML  SOLN
100.0000 mL | Freq: Once | INTRAMUSCULAR | Status: AC | PRN
  Administered 2012-01-07: 100 mL via INTRAVENOUS

## 2012-01-07 NOTE — Progress Notes (Signed)
Blood sample obtained from right arm IV for Creatnine level.  

## 2012-01-07 NOTE — Progress Notes (Signed)
Called abnormal lab result to [employer] by [employer]

## 2012-02-12 ENCOUNTER — Encounter (HOSPITAL_COMMUNITY): Payer: Medicare Other | Attending: Oncology

## 2012-02-12 DIAGNOSIS — Z85038 Personal history of other malignant neoplasm of large intestine: Secondary | ICD-10-CM | POA: Insufficient documentation

## 2012-02-12 DIAGNOSIS — C183 Malignant neoplasm of hepatic flexure: Secondary | ICD-10-CM

## 2012-02-12 NOTE — Progress Notes (Signed)
Labs drawn today for cea 

## 2012-02-13 LAB — CEA: CEA: 1 ng/mL (ref 0.0–5.0)

## 2012-03-14 ENCOUNTER — Encounter (HOSPITAL_COMMUNITY): Payer: Medicare Other | Attending: Oncology | Admitting: Oncology

## 2012-03-14 ENCOUNTER — Encounter (HOSPITAL_COMMUNITY): Payer: Self-pay | Admitting: Oncology

## 2012-03-14 VITALS — BP 161/71 | HR 83 | Temp 98.8°F | Resp 18 | Wt 188.6 lb

## 2012-03-14 DIAGNOSIS — Z85038 Personal history of other malignant neoplasm of large intestine: Secondary | ICD-10-CM | POA: Insufficient documentation

## 2012-03-14 NOTE — Progress Notes (Signed)
Problem #1 stage III C. adenocarcinoma of the colon occurring at the hepatic flexure status post surgery in 11/14/2007 by Dr. Lovell Sheehan her grade 1, 5.4 cm cancer, with resection margins less than 0.1 cm. There was perforation of the visceral peritoneum and depth of the invasion was through the muscularis propria into the pericolonic soft tissues with 2 of 17 positive lymph nodes. She had a K-ras mutation as well, but her stage gave her a T4, N1 cancer. She is status post 6 cycles of adjuvant chemotherapy with oxaliplatinum, 5-FU leucovorin. She has no evidence recurrence thus far. Problem #2 history of a stroke with nice resolution of neurological deficits. Problem #3 obesity weighing 188 pounds on a 5 foot 3 inch frame. She has no new complaints on oncology review of systems. She is followed by Dr. Darrick Penna. Her lab work is stable.  Her vital signs are stable. She has no lymphadenopathy. Lungs are clear. Heart shows a regular rhythm and rate without murmur rub or gallop. Abdomen remains soft and nontender without organomegaly. Bowel sounds are normal. There is no ascites. She has no leg edema. She is alert and oriented.  She needs to continue followup with CEAs every 3 months. We'll see her in 6 months. When she is out 5 years from completion of therapy we can change her CEAs every 6 months for 2 years in her visits to every 12 months. She needs a CAT scan in October or November of this year.

## 2012-03-14 NOTE — Patient Instructions (Addendum)
[facility] Discharge Instructions  RECOMMENDATIONS MADE BY THE CONSULTANT AND ANY TEST RESULTS WILL BE SENT TO YOUR REFERRING PHYSICIAN.  EXAM FINDINGS BY THE PHYSICIAN TODAY AND SIGNS OR SYMPTOMS TO REPORT TO CLINIC OR PRIMARY PHYSICIAN: Exam and discussion by MD.  Bonita Quin are doing well.  Your tumor marker is normal.  No evidence of recurrence by exam.  MEDICATIONS PRESCRIBED:  none  INSTRUCTIONS GIVEN AND DISCUSSED: Report changes in bowel habits, blood in your bowel movements.  SPECIAL INSTRUCTIONS/FOLLOW-UP: CEA every 3 months and to see PA in follow-up in 6 months.  Thank you for choosing [facility] to provide your oncology and hematology care.  To afford each patient quality time with our providers, please arrive at least 15 minutes before your scheduled appointment time.  With your help, our goal is to use those 15 minutes to complete the necessary work-up to ensure our physicians have the information they need to help with your evaluation and healthcare recommendations.    Effective January 1st, 2014, we ask that you re-schedule your appointment with our physicians should you arrive 10 or more minutes late for your appointment.  We strive to give you quality time with our providers, and arriving late affects you and other patients whose appointments are after yours.    Again, thank you for choosing [facility].  Our hope is that these requests will decrease the amount of time that you wait before being seen by our physicians.       _____________________________________________________________  Should you have questions after your visit to [facility], please contact our office at [phone] between the hours of 8:30 a.m. and 5:00 p.m.  Voicemails left after 4:30 p.m. will not be returned until the following business day.  For prescription refill requests, have your pharmacy contact our office with your prescription refill  request.

## 2012-03-27 ENCOUNTER — Other Ambulatory Visit (HOSPITAL_COMMUNITY): Payer: Self-pay | Admitting: Family Medicine

## 2012-03-27 DIAGNOSIS — Z139 Encounter for screening, unspecified: Secondary | ICD-10-CM

## 2012-04-07 ENCOUNTER — Ambulatory Visit (HOSPITAL_COMMUNITY)
Admission: RE | Admit: 2012-04-07 | Discharge: 2012-04-07 | Disposition: A | Discharge status: Home / Self Care | Payer: Medicare Other | Source: Ambulatory Visit | Attending: Family Medicine | Admitting: Family Medicine

## 2012-04-07 DIAGNOSIS — Z139 Encounter for screening, unspecified: Secondary | ICD-10-CM

## 2012-04-07 DIAGNOSIS — M899 Disorder of bone, unspecified: Secondary | ICD-10-CM | POA: Insufficient documentation

## 2012-04-07 DIAGNOSIS — Z13820 Encounter for screening for osteoporosis: Secondary | ICD-10-CM | POA: Insufficient documentation

## 2012-05-12 ENCOUNTER — Encounter (HOSPITAL_COMMUNITY): Payer: Medicare Other | Attending: Oncology

## 2012-05-12 DIAGNOSIS — Z85038 Personal history of other malignant neoplasm of large intestine: Secondary | ICD-10-CM | POA: Insufficient documentation

## 2012-05-12 NOTE — Progress Notes (Signed)
Labs drawn for cea

## 2012-05-13 LAB — CEA: CEA: 1.2 ng/mL (ref 0.0–5.0)

## 2012-05-14 NOTE — Progress Notes (Signed)
TCS FEB 2013 hyperplastic polyps

## 2012-08-12 ENCOUNTER — Encounter (HOSPITAL_COMMUNITY): Payer: Medicare Other | Attending: Oncology

## 2012-08-12 DIAGNOSIS — Z85038 Personal history of other malignant neoplasm of large intestine: Secondary | ICD-10-CM | POA: Insufficient documentation

## 2012-08-12 LAB — COMPREHENSIVE METABOLIC PANEL
ALT: 21 U/L (ref 0–35)
AST: 24 U/L (ref 0–37)
Alkaline Phosphatase: 78 U/L (ref 39–117)
CO2: 26 mEq/L (ref 19–32)
Chloride: 98 mEq/L (ref 96–112)
GFR calc Af Amer: >90 mL/min (ref >90)
GFR calc non Af Amer: 87 mL/min — ABNORMAL LOW (ref >90)
Glucose, Bld: 100 mg/dL — ABNORMAL HIGH (ref 70–99)
Potassium: 4 mEq/L (ref 3.5–5.1)
Sodium: 136 mEq/L (ref 135–145)

## 2012-08-12 LAB — CBC WITH DIFFERENTIAL/PLATELET
Lymphocytes Relative: 45 % (ref 12–46)
Lymphs Abs: 2 10*3/uL (ref 0.7–4.0)
MCV: 80 fL (ref 78.0–100.0)
Neutro Abs: 2 10*3/uL (ref 1.7–7.7)
Neutrophils Relative %: 44 % (ref 43–77)
Platelets: 186 10*3/uL (ref 150–400)
RBC: 5.04 MIL/uL (ref 3.87–5.11)
WBC: 4.5 10*3/uL (ref 4.0–10.5)

## 2012-08-12 NOTE — Progress Notes (Signed)
Janice Curtis's reason for visit today are for labs as scheduled per MD orders.  Venipuncture performed with a 23 gauge butterfly needle to R Antecubital.  [name] tolerated venipuncture well and without incident; questions were answered and patient was discharged.

## 2012-08-13 LAB — CEA: CEA: 1.2 ng/mL (ref 0.0–5.0)

## 2012-08-14 ENCOUNTER — Ambulatory Visit (HOSPITAL_COMMUNITY): Payer: Medicare Other | Admitting: Oncology

## 2012-08-26 ENCOUNTER — Encounter (HOSPITAL_BASED_OUTPATIENT_CLINIC_OR_DEPARTMENT_OTHER): Payer: Medicare Other | Admitting: Oncology

## 2012-08-26 VITALS — BP 160/78 | HR 71 | Temp 97.4°F | Resp 16 | Wt 187.6 lb

## 2012-08-26 DIAGNOSIS — Z85038 Personal history of other malignant neoplasm of large intestine: Secondary | ICD-10-CM

## 2012-08-26 NOTE — Progress Notes (Signed)
#  1 stage III C., adenocarcinoma of colon at the hepatic flexure, with surgical resection 11/14/2007 by Dr. Franky Macho. She grade 1 cancer. The cancer was 5.4 cm resection margins were less than 0.1 cm. There was perforation of the visceral peritoneum and depth of invasion was through the muscularis propria into the pericolonic soft tissues with 2 of 17 positive lymph nodes. She had a K-ras positive mutation. She had a T4, N1 cancer took 6 cycles of adjuvant chemotherapy with oxaliplatinum, 5-FU, and leucovorin. Thus far she's had no evidence recurrence.  #2 history of a stroke with nice resolution still on Plavix. #3 hypercholesterolemia on Zocor #4 obesity  She has a negative oncology review of systems. She's been doing well. She looks good. Vital signs are stable.  She is exercising 2 days a week only.  Her blood work the other day was excellent.  On exam she has no lymphadenopathy in the cervical, supraclavicular, infraclavicular, or axillary or inguinal areas. Breast exam is negative for masses. She is alert and oriented. Facial symmetry is intact. She has no thyromegaly. Skin exam is unremarkable. Lungs are clear. Heart shows a regular rhythm and rate without murmur rub or gallop. Abdomen remains soft, nontender with normal bowel sounds and no obvious hepatosplenomegaly or ascites. She has no arm or leg edema.  She is still at high-risk of recurrence, we will get a CAT scan in November blood work in November and see right after that.

## 2012-08-26 NOTE — Patient Instructions (Addendum)
[facility] Discharge Instructions  RECOMMENDATIONS MADE BY THE CONSULTANT AND ANY TEST RESULTS WILL BE SENT TO YOUR REFERRING PHYSICIAN.  EXAM FINDINGS BY THE PHYSICIAN TODAY AND SIGNS OR SYMPTOMS TO REPORT TO CLINIC OR PRIMARY PHYSICIAN: Exam and discussion by MD.  Bonita Quin are doing well.  No evidence of recurrence by exam.  MEDICATIONS PRESCRIBED:  none  INSTRUCTIONS GIVEN AND DISCUSSED: Report changes in bowel habits, blood in your stool or other problems.  SPECIAL INSTRUCTIONS/FOLLOW-UP: Blood work, CT scans and follow-up in November.  Thank you for choosing [facility] to provide your oncology and hematology care.  To afford each patient quality time with our providers, please arrive at least 15 minutes before your scheduled appointment time.  With your help, our goal is to use those 15 minutes to complete the necessary work-up to ensure our physicians have the information they need to help with your evaluation and healthcare recommendations.    Effective January 1st, 2014, we ask that you re-schedule your appointment with our physicians should you arrive 10 or more minutes late for your appointment.  We strive to give you quality time with our providers, and arriving late affects you and other patients whose appointments are after yours.    Again, thank you for choosing [facility].  Our hope is that these requests will decrease the amount of time that you wait before being seen by our physicians.       _____________________________________________________________  Should you have questions after your visit to [facility], please contact our office at [phone] between the hours of 8:30 a.m. and 5:00 p.m.  Voicemails left after 4:30 p.m. will not be returned until the following business day.  For prescription refill requests, have your pharmacy contact our office with your prescription refill request.

## 2012-10-06 ENCOUNTER — Other Ambulatory Visit (HOSPITAL_COMMUNITY): Payer: Self-pay | Admitting: Family Medicine

## 2012-10-06 DIAGNOSIS — Z139 Encounter for screening, unspecified: Secondary | ICD-10-CM

## 2012-10-30 ENCOUNTER — Ambulatory Visit (HOSPITAL_COMMUNITY)
Admission: RE | Admit: 2012-10-30 | Discharge: 2012-10-30 | Disposition: A | Discharge status: Home / Self Care | Payer: Medicare Other | Source: Ambulatory Visit | Attending: Family Medicine | Admitting: Family Medicine

## 2012-10-30 DIAGNOSIS — Z1231 Encounter for screening mammogram for malignant neoplasm of breast: Secondary | ICD-10-CM | POA: Insufficient documentation

## 2012-10-30 DIAGNOSIS — Z139 Encounter for screening, unspecified: Secondary | ICD-10-CM

## 2012-11-12 ENCOUNTER — Other Ambulatory Visit (HOSPITAL_COMMUNITY): Payer: Medicare Other

## 2013-01-21 ENCOUNTER — Telehealth (HOSPITAL_COMMUNITY): Payer: Self-pay

## 2013-01-21 NOTE — Telephone Encounter (Signed)
Message copied by [name] on Wed Jan 21, 2013  6:19 PM ------      Message from: Ellouise Newer      Created: Wed Jan 21, 2013 11:34 AM       Please call patient.  CT scan has been denied by insurance.  5 years worth of CT scans has been completed and from our standpoint, he does not require any more.  This follows NCCN guidelines.              ----- Message -----         From: Mahalia Longest         Sent: 01/20/2013   2:23 PM           To: Ellouise Newer, PA-C            Please call or have a nurse to call pt explaining why she does not need the ct scan. The ct is Friday the 21st       ------

## 2013-01-22 NOTE — Telephone Encounter (Signed)
Patient notified that insurance denied scans and per NCCN guidelines she does not need this done.  Plans to come in on 11/21 for blood work.

## 2013-01-23 ENCOUNTER — Ambulatory Visit (HOSPITAL_COMMUNITY)
Admission: RE | Admit: 2013-01-23 | Discharge: 2013-01-23 | Disposition: A | Discharge status: Home / Self Care | Payer: Medicare Other | Source: Ambulatory Visit | Attending: Oncology | Admitting: Oncology

## 2013-01-23 ENCOUNTER — Ambulatory Visit (HOSPITAL_COMMUNITY): Payer: Medicare Other

## 2013-01-23 ENCOUNTER — Encounter (HOSPITAL_COMMUNITY): Payer: Medicare Other | Attending: Hematology and Oncology

## 2013-01-23 DIAGNOSIS — I7090 Unspecified atherosclerosis: Secondary | ICD-10-CM | POA: Insufficient documentation

## 2013-01-23 DIAGNOSIS — Z85038 Personal history of other malignant neoplasm of large intestine: Secondary | ICD-10-CM

## 2013-01-23 DIAGNOSIS — Z09 Encounter for follow-up examination after completed treatment for conditions other than malignant neoplasm: Secondary | ICD-10-CM | POA: Insufficient documentation

## 2013-01-23 DIAGNOSIS — C189 Malignant neoplasm of colon, unspecified: Secondary | ICD-10-CM | POA: Insufficient documentation

## 2013-01-23 DIAGNOSIS — K7689 Other specified diseases of liver: Secondary | ICD-10-CM | POA: Insufficient documentation

## 2013-01-23 LAB — CBC WITH DIFFERENTIAL/PLATELET
Basophils Absolute: 0 10*3/uL (ref 0.0–0.1)
Basophils Relative: 0 % (ref 0–1)
Eosinophils Absolute: 0.1 10*3/uL (ref 0.0–0.7)
MCH: 26.7 pg (ref 26.0–34.0)
MCHC: 33.3 g/dL (ref 30.0–36.0)
Monocytes Absolute: 0.4 10*3/uL (ref 0.1–1.0)
Neutro Abs: 3 10*3/uL (ref 1.7–7.7)
Neutrophils Relative %: 53 % (ref 43–77)
RDW: 13.4 % (ref 11.5–15.5)

## 2013-01-23 LAB — COMPREHENSIVE METABOLIC PANEL
AST: 19 U/L (ref 0–37)
Albumin: 3.9 g/dL (ref 3.5–5.2)
BUN: 9 mg/dL (ref 6–23)
Chloride: 97 mEq/L (ref 96–112)
Creatinine, Ser: 0.7 mg/dL (ref 0.50–1.10)
Potassium: 3.9 mEq/L (ref 3.5–5.1)
Total Bilirubin: 0.4 mg/dL (ref 0.3–1.2)
Total Protein: 8 g/dL (ref 6.0–8.3)

## 2013-01-23 MED ORDER — IOHEXOL 300 MG/ML  SOLN
100.0000 mL | Freq: Once | INTRAMUSCULAR | Status: AC | PRN
  Administered 2013-01-23: 100 mL via INTRAVENOUS

## 2013-01-23 NOTE — Progress Notes (Signed)
Labs drawn today for cbc/diffcmp,cea

## 2013-01-24 LAB — CEA: CEA: 1.2 ng/mL (ref 0.0–5.0)

## 2013-01-27 ENCOUNTER — Encounter (HOSPITAL_COMMUNITY): Payer: Self-pay

## 2013-01-27 ENCOUNTER — Encounter (HOSPITAL_BASED_OUTPATIENT_CLINIC_OR_DEPARTMENT_OTHER): Payer: Medicare Other

## 2013-01-27 VITALS — BP 157/82 | HR 70 | Temp 97.9°F | Resp 18

## 2013-01-27 DIAGNOSIS — Z85038 Personal history of other malignant neoplasm of large intestine: Secondary | ICD-10-CM

## 2013-01-27 NOTE — Patient Instructions (Signed)
[facility] Discharge Instructions  RECOMMENDATIONS MADE BY THE CONSULTANT AND ANY TEST RESULTS WILL BE SENT TO YOUR REFERRING PHYSICIAN.  EXAM FINDINGS BY THE PHYSICIAN TODAY AND SIGNS OR SYMPTOMS TO REPORT TO CLINIC OR PRIMARY PHYSICIAN: Exam and findings as discussed by Dr. Zigmund Daniel.  INSTRUCTIONS/FOLLOW-UP: 1.  Return yearly to see the physician. 2.  Return in 1 year for labs as well.  Thank you for choosing [facility] to provide your oncology and hematology care.  To afford each patient quality time with our providers, please arrive at least 15 minutes before your scheduled appointment time.  With your help, our goal is to use those 15 minutes to complete the necessary work-up to ensure our physicians have the information they need to help with your evaluation and healthcare recommendations.    Effective January 1st, 2014, we ask that you re-schedule your appointment with our physicians should you arrive 10 or more minutes late for your appointment.  We strive to give you quality time with our providers, and arriving late affects you and other patients whose appointments are after yours.    Again, thank you for choosing [facility].  Our hope is that these requests will decrease the amount of time that you wait before being seen by our physicians.       _____________________________________________________________  Should you have questions after your visit to [facility], please contact our office at [phone] between the hours of 8:30 a.m. and 5:00 p.m.  Voicemails left after 4:30 p.m. will not be returned until the following business day.  For prescription refill requests, have your pharmacy contact our office with your prescription refill request.

## 2013-01-27 NOTE — Progress Notes (Signed)
[facility]  OFFICE PROGRESS NOTE  [name], MD [address] [place] [address]  DIAGNOSIS: No diagnosis found.  Chief Complaint  Patient presents with  . Colon Cancer    CURRENT THERAPY: Watchful expectation.  INTERVAL HISTORY: Janice Curtis 72 y.o. female returns for followup of stage III C. Adenocarcinoma of the hepatic flexure resected on 11/14/2007 with perforation of the visceral peritoneum with 2 of 17 positive lymph nodes with K-ras mutated. Your sugar with 6 cycles of FOLFOX and is here today for discussion of recent CT scan findings and for routine followup.  Recently she said nasal drip with some congestion attributed to fall allergies. Appetite is good with no nausea, vomiting, diarrhea, constipation, melena, hematochezia, hematuria, with only minimal right lower extremity swelling when she stands on her feet all day.she does suffer with some stress incontinence. She denies any fever, night sweats, abdominal pain, joint pain, skin rash, headache, or seizures.  MEDICAL HISTORY: Past Medical History  Diagnosis Date  . Colon cancer   . Stroke 12/05/2009  . Hypercholesterolemia   . Hypertension   . Anxiety     INTERIM HISTORY: has VITAMIN D DEFICIENCY; HYPERCHOLESTEROLEMIA; HYPERTENSION; ADENOCARCINOMA, COLON, HX OF; HYPOKALEMIA, HX OF; and ANEMIA, IRON DEFICIENCY, HX OF on her problem list.   Stage III C., adenocarcinoma of colon at the hepatic flexure, with surgical resection 11/14/2007 by Dr. Franky Macho. She grade 1 cancer. The cancer was 5.4 cm resection margins were less than 0.1 cm. There was perforation of the visceral peritoneum and depth of invasion was through the muscularis propria into the pericolonic soft tissues with 2 of 17 positive lymph nodes. She had a K-ras positive mutation. She had a T4, N1 cancer took 6 cycles of adjuvant chemotherapy with oxaliplatinum, 5-FU, and  leucovorin  ALLERGIES:  has No Known Allergies.  MEDICATIONS: has a current medication list which includes the following prescription(s): acetaminophen, vitamin d3, clopidogrel, cyanocobalamin, guaifenesin, simvastatin, verapamil, and casanthranol-docusate sodium.  SURGICAL HISTORY:  Past Surgical History  Procedure Laterality Date  . Right colectomy  11/14/2007  . Colonoscopy  12/27/08    simple adenoma/inflammatory polyp at anastomosis  . Colonoscopy  04/23/2011    Procedure: COLONOSCOPY;  Surgeon: Arlyce Harman, MD;  [facility];  Service: Endoscopy;  Laterality: N/A;  10:00    FAMILY HISTORY: family history includes Heart attack in her mother; Stroke in her father.  SOCIAL HISTORY:  reports that she has never smoked. She has never used smokeless tobacco. She reports that she does not drink alcohol or use illicit drugs.  REVIEW OF SYSTEMS:  Other than that discussed above is noncontributory.  PHYSICAL EXAMINATION: ECOG PERFORMANCE STATUS: 0 - Asymptomatic  There were no vitals taken for this visit.  GENERAL:alert, no distress and comfortable SKIN: skin color, texture, turgor are normal, no rashes or significant lesions EYES: PERLA; Conjunctiva are pink and non-injected, sclera clear OROPHARYNX:no exudate, no erythema on lips, buccal mucosa, or tongue. NECK: supple, thyroid normal size, non-tender, without nodularity. No masses CHEST: status post removal of life port. No breast masses. LYMPH:  no palpable lymphadenopathy in the cervical, axillary or inguinal LUNGS: clear to auscultation and percussion with normal breathing effort HEART: regular rate & rhythm and no murmurs. ABDOMEN:abdomen soft, non-tender and normal bowel sounds MUSCULOSKELETAL:no cyanosis of digits and no clubbing. Range of motion normal.  NEURO: alert & oriented x 3 with fluent  speech, no focal motor/sensory deficits   LABORATORY DATA: Infusion on 01/23/2013  Component Date Value Range  Status  . WBC 01/23/2013 5.7  4.0 - 10.5 K/uL Final  . RBC 01/23/2013 5.29* 3.87 - 5.11 MIL/uL Final  . Hemoglobin 01/23/2013 14.1  12.0 - 15.0 g/dL Final  . HCT 40/98/1191 42.4  36.0 - 46.0 % Final  . MCV 01/23/2013 80.2  78.0 - 100.0 fL Final  . MCH 01/23/2013 26.7  26.0 - 34.0 pg Final  . MCHC 01/23/2013 33.3  30.0 - 36.0 g/dL Final  . RDW 47/82/9562 13.4  11.5 - 15.5 % Final  . Platelets 01/23/2013 196  150 - 400 K/uL Final  . Neutrophils Relative % 01/23/2013 53  43 - 77 % Final  . Neutro Abs 01/23/2013 3.0  1.7 - 7.7 K/uL Final  . Lymphocytes Relative 01/23/2013 38  12 - 46 % Final  . Lymphs Abs 01/23/2013 2.2  0.7 - 4.0 K/uL Final  . Monocytes Relative 01/23/2013 7  3 - 12 % Final  . Monocytes Absolute 01/23/2013 0.4  0.1 - 1.0 K/uL Final  . Eosinophils Relative 01/23/2013 2  0 - 5 % Final  . Eosinophils Absolute 01/23/2013 0.1  0.0 - 0.7 K/uL Final  . Basophils Relative 01/23/2013 0  0 - 1 % Final  . Basophils Absolute 01/23/2013 0.0  0.0 - 0.1 K/uL Final  . Sodium 01/23/2013 136  135 - 145 mEq/L Final  . Potassium 01/23/2013 3.9  3.5 - 5.1 mEq/L Final  . Chloride 01/23/2013 97  96 - 112 mEq/L Final  . CO2 01/23/2013 28  19 - 32 mEq/L Final  . Glucose, Bld 01/23/2013 95  70 - 99 mg/dL Final  . BUN 13/10/6576 9  6 - 23 mg/dL Final  . Creatinine, Ser 01/23/2013 0.70  0.50 - 1.10 mg/dL Final  . Calcium 46/96/2952 9.6  8.4 - 10.5 mg/dL Final  . Total Protein 01/23/2013 8.0  6.0 - 8.3 g/dL Final  . Albumin 84/13/2440 3.9  3.5 - 5.2 g/dL Final  . AST 12/30/2534 19  0 - 37 U/L Final  . ALT 01/23/2013 17  0 - 35 U/L Final  . Alkaline Phosphatase 01/23/2013 80  39 - 117 U/L Final  . Total Bilirubin 01/23/2013 0.4  0.3 - 1.2 mg/dL Final  . GFR calc non Af Amer 01/23/2013 85* >90 mL/min Final  . GFR calc Af Amer 01/23/2013 >90  >90 mL/min Final   Comment: (NOTE)                          The eGFR has been calculated using the CKD EPI equation.                          This  calculation has not been validated in all clinical situations.                          eGFR's persistently <90 mL/min signify possible Chronic Kidney                          Disease.  . CEA 01/23/2013 1.2  0.0 - 5.0 ng/mL Final   Performed at [facility]    PATHOLOGY:  Urinalysis    Component Value Date/Time   COLORURINE YELLOW 11/11/2011 1026   APPEARANCEUR CLEAR 11/11/2011 1026   LABSPEC  1.010 11/11/2011 1026   PHURINE 6.5 11/11/2011 1026   GLUCOSEU NEGATIVE 11/11/2011 1026   HGBUR NEGATIVE 11/11/2011 1026   BILIRUBINUR NEGATIVE 11/11/2011 1026   KETONESUR NEGATIVE 11/11/2011 1026   PROTEINUR NEGATIVE 11/11/2011 1026   UROBILINOGEN 0.2 11/11/2011 1026   NITRITE NEGATIVE 11/11/2011 1026   LEUKOCYTESUR NEGATIVE 11/11/2011 1026    RADIOGRAPHIC STUDIES: Ct Abdomen Pelvis W Contrast  01/23/2013   CLINICAL DATA:  Colon cancer.  EXAM: CT ABDOMEN AND PELVIS WITH CONTRAST  TECHNIQUE: Multidetector CT imaging of the abdomen and pelvis was performed using the standard protocol following bolus administration of intravenous contrast.  CONTRAST:  OMNIPAQUE IOHEXOL 300 MG/ML  SOLN  COMPARISON:  12/25/2010 and 01/07/2012  FINDINGS: The lung bases are clear of acute process. Minimal dependent atelectasis. There is a bony exostosis projecting off the transverse process of T10 on the left which is likely a benign osteochondroma.  The liver demonstrates geographic fatty infiltration but no worrisome hepatic lesion. There is a stable lateral segment left hepatic lobe cyst. The gallbladder is normal. No common bile duct dilatation. The pancreas is normal. The spleen is normal except for a small cyst or hemangioma. The adrenal glands and kidneys are normal.  The stomach, duodenum, small bowel and colon are unremarkable. No inflammatory changes, mass lesions or obstructive findings. No mesenteric or retroperitoneal mass or adenopathy. Moderate atherosclerotic calcifications involving the aorta. The branch vessels  are patent.  The uterus and ovaries are unremarkable. No pelvic mass, adenopathy or free pelvic fluid collections. No inguinal mass or adenopathy.  The bony structures are unremarkable.  IMPRESSION: Unremarkable and stable CT appearance of the abdomen/pelvis. No findings for recurrent or metastatic disease.  Stable geographic fatty infiltration of the liver and small lateral segment left hepatic lobe cyst.  Stable atherosclerotic calcifications involving the aorta and branch vessels.   Electronically Signed   By: Loralie Champagne M.D.   On: 01/23/2013 11:09    ASSESSMENT:  #1.Stage III C., adenocarcinoma of colon at the hepatic flexure, with surgical resection 11/14/2007 by Dr. Franky Macho. She grade 1 cancer. The cancer was 5.4 cm resection margins were less than 0.1 cm. There was perforation of the visceral peritoneum and depth of invasion was through the muscularis propria into the pericolonic soft tissues with 2 of 17 positive lymph nodes. She had a K-ras positive mutation. She had a T4, N1 cancer took 6 cycles of adjuvant chemotherapy with oxaliplatinum, 5-FU, and leucovorin. Thus far she's had no evidence recurrence #2 history of a stroke with nice resolution still on Plavix.  #3 hypercholesterolemia on Zocor  #4 obesity, mild.  PLAN:  #1. Last colonoscopy was 3 years ago and she was told that another one was not needed for 5 years. #2. Followup will be in one year with lab tests but she was told to call should any new symptoms occur that are troublesome and persistent.   All questions were answered. The patient knows to call the clinic with any problems, questions or concerns. We can certainly see the patient much sooner if necessary.   I spent 25 minutes counseling the patient face to face. The total time spent in the appointment was 30 minutes.    Maurilio Lovely, MD 01/27/2013 9:58 AM

## 2013-02-11 ENCOUNTER — Other Ambulatory Visit (HOSPITAL_COMMUNITY): Payer: Medicare Other

## 2013-10-21 ENCOUNTER — Other Ambulatory Visit (HOSPITAL_COMMUNITY): Payer: Self-pay | Admitting: Family Medicine

## 2013-10-21 DIAGNOSIS — Z1231 Encounter for screening mammogram for malignant neoplasm of breast: Secondary | ICD-10-CM

## 2013-11-02 ENCOUNTER — Ambulatory Visit (HOSPITAL_COMMUNITY)
Admission: RE | Admit: 2013-11-02 | Discharge: 2013-11-02 | Disposition: A | Discharge status: Home / Self Care | Payer: Medicare Other | Source: Ambulatory Visit | Attending: Family Medicine | Admitting: Family Medicine

## 2013-11-02 DIAGNOSIS — Z1231 Encounter for screening mammogram for malignant neoplasm of breast: Secondary | ICD-10-CM | POA: Diagnosis not present

## 2014-01-18 ENCOUNTER — Other Ambulatory Visit (HOSPITAL_COMMUNITY): Payer: Medicare Other

## 2014-01-21 ENCOUNTER — Ambulatory Visit (HOSPITAL_COMMUNITY): Payer: Medicare Other

## 2014-01-22 NOTE — Progress Notes (Signed)
This encounter was created in error - please disregard.

## 2014-01-24 NOTE — Progress Notes (Signed)
Janice Grills, MD [place] [id number]  History of malignant neoplasm of large intestine - Plan: CBC with Differential, Comprehensive metabolic panel, CEA  ANEMIA, IRON DEFICIENCY, HX OF - Plan: Ferritin  CURRENT THERAPY: Surveillance per NCCN guidelines  INTERVAL HISTORY: Janice Curtis 73 y.o. female returns for  regular  visit for followup of stage III C. Adenocarcinoma of the hepatic flexure resected on 11/14/2007 with perforation of the visceral peritoneum with 2 of 17 positive lymph nodes with K-ras mutated. Treated with 12 cycles of FOLFOX.    I personally reviewed and went over laboratory results with the patient.  The results are noted within this dictation.   Updated labs will be drawn today.   The patient underwent colonoscopy in February 2013 by Dr. Oneida Alar. At that point in time, she recommended repeat total colonoscopy in 5 years time which would mean that [name] is due for screening colonoscopy in February 2018.   She denies any complaints from an oncology standpoint and review of systems questioning is negative.   Past Medical History  Diagnosis Date  . Colon cancer   . Stroke 12/05/2009  . Hypercholesterolemia   . Hypertension   . Anxiety     has VITAMIN D DEFICIENCY; HYPERCHOLESTEROLEMIA; HYPERTENSION; History of malignant neoplasm of large intestine; HYPOKALEMIA, HX OF; and ANEMIA, IRON DEFICIENCY, HX OF on her problem list.     has No Known Allergies.  [name] does not currently have medications on file.  Past Surgical History  Procedure Laterality Date  . Right colectomy  11/14/2007  . Colonoscopy  12/27/08    simple adenoma/inflammatory polyp at anastomosis  . Colonoscopy  04/23/2011    Procedure: COLONOSCOPY;  Surgeon: Dorothyann Peng, MD;  [facility];  Service: Endoscopy;  Laterality: N/A;  10:00    Denies any headaches, dizziness, double vision, fevers, chills, night sweats, nausea, vomiting, diarrhea,  constipation, chest pain, heart palpitations, shortness of breath, blood in stool, black tarry stool, urinary pain, urinary burning, urinary frequency, hematuria.   PHYSICAL EXAMINATION  ECOG PERFORMANCE STATUS: 0 - Asymptomatic  Filed Vitals:   01/27/14 1329  BP: 183/63  Pulse: 59  Temp: 98.7 F (37.1 C)  Resp: 16    GENERAL:alert, no distress, well nourished, well developed, comfortable, cooperative, obese and smiling SKIN: skin color, texture, turgor are normal, no rashes or significant lesions HEAD: Normocephalic, No masses, lesions, tenderness or abnormalities EYES: normal, PERRLA, EOMI, Conjunctiva are pink and non-injected EARS: External ears normal OROPHARYNX:lips, buccal mucosa, and tongue normal and mucous membranes are moist  NECK: supple, no adenopathy, thyroid normal size, non-tender, without nodularity, no stridor, non-tender, trachea midline LYMPH:  no palpable lymphadenopathy, no hepatosplenomegaly BREAST:not examined LUNGS: clear to auscultation and percussion HEART: regular rate & rhythm, no murmurs, no gallops, S1 normal and S2 normal ABDOMEN:abdomen soft, non-tender, obese, normal bowel sounds, no masses or organomegaly and no hepatosplenomegaly BACK: Back symmetric, no curvature., No CVA tenderness EXTREMITIES:less then 2 second capillary refill, no joint deformities, effusion, or inflammation, no edema, no skin discoloration, no clubbing, no cyanosis  NEURO: alert & oriented x 3 with fluent speech, no focal motor/sensory deficits, gait normal   LABORATORY DATA: CBC    Component Value Date/Time   WBC 5.7 01/23/2013 0856   RBC 5.29* 01/23/2013 0856   HGB 14.1 01/23/2013 0856   HCT 42.4 01/23/2013 0856   PLT 196 01/23/2013 0856   MCV 80.2 01/23/2013 0856   MCH 26.7 01/23/2013 0856  MCHC 33.3 01/23/2013 0856   RDW 13.4 01/23/2013 0856   LYMPHSABS 2.2 01/23/2013 0856   MONOABS 0.4 01/23/2013 0856   EOSABS 0.1 01/23/2013 0856   BASOSABS 0.0 01/23/2013  0856      Chemistry      Component Value Date/Time   NA 136 01/23/2013 0856   K 3.9 01/23/2013 0856   CL 97 01/23/2013 0856   CO2 28 01/23/2013 0856   BUN 9 01/23/2013 0856   CREATININE 0.70 01/23/2013 0856      Component Value Date/Time   CALCIUM 9.6 01/23/2013 0856   ALKPHOS 80 01/23/2013 0856   AST 19 01/23/2013 0856   ALT 17 01/23/2013 0856   BILITOT 0.4 01/23/2013 0856     Lab Results  Component Value Date   CEA 1.2 01/23/2013     ASSESSMENT:  1.Stage III C., adenocarcinoma of colon at the hepatic flexure, with surgical resection 11/14/2007 by Dr. Aviva Signs. It was a grade 1 cancer. The cancer was 5.4 cm resection margins were less than 0.1 cm. There was perforation of the visceral peritoneum and depth of invasion was through the muscularis propria into the pericolonic soft tissues with 2 of 17 positive lymph nodes. Tumor had a K-ras positive mutation. T4, N1 cancer treated with 12 cycles of adjuvant chemotherapy with oxaliplatin, 5-FU, and leucovorin. Thus far no evidence recurrence 2. History of a stroke with nice resolution still on Plavix.  3. Hypercholesterolemia on Zocor  4. Obesity, mild. 5. Colonoscopy on 04/23/2011 by Dr. Oneida Alar.  Next one is due in Feb 2018  Patient Active Problem List   Diagnosis Date Noted  . VITAMIN D DEFICIENCY 12/08/2008  . HYPERCHOLESTEROLEMIA 12/08/2008  . HYPERTENSION 12/08/2008  . History of malignant neoplasm of large intestine 12/08/2008  . HYPOKALEMIA, HX OF 12/08/2008  . ANEMIA, IRON DEFICIENCY, HX OF 12/08/2008     PLAN: 1. I personally reviewed and went over laboratory results with the patient.  The results are noted within this dictation. 2. Labs today: CBC diff, CMET, Ferritin, CEA 3. No role for surveillance CT imaging per NCCN guidelines 4. Labs in 12 months: CBC diff, CMET, Ferritin, CEA 5. Return in 12 months for follow-up   THERAPY PLAN:  Jayma has exhausted guidelines pertaining to surveillance per NCCN  guidelines.  We will continue with annual follow-up.  I think discharge from the [facility] can be considered following her next colonoscopy in 2018 if she wishes.   All questions were answered. The patient knows to call the clinic with any problems, questions or concerns. We can certainly see the patient much sooner if necessary.  Patient and plan discussed with Dr. Farrel Gobble and he is in agreement with the aforementioned.   KEFALAS,Alguire 01/27/2014

## 2014-01-27 ENCOUNTER — Encounter (HOSPITAL_COMMUNITY): Payer: Medicare Other | Attending: Oncology | Admitting: Oncology

## 2014-01-27 ENCOUNTER — Encounter (HOSPITAL_COMMUNITY): Payer: Self-pay | Admitting: Oncology

## 2014-01-27 VITALS — BP 183/63 | HR 59 | Temp 98.7°F | Resp 16 | Wt 181.0 lb

## 2014-01-27 DIAGNOSIS — Z85038 Personal history of other malignant neoplasm of large intestine: Secondary | ICD-10-CM | POA: Diagnosis present

## 2014-01-27 DIAGNOSIS — Z862 Personal history of diseases of the blood and blood-forming organs and certain disorders involving the immune mechanism: Secondary | ICD-10-CM | POA: Diagnosis not present

## 2014-01-27 LAB — CBC WITH DIFFERENTIAL/PLATELET
BASOS PCT: 0 % (ref 0–1)
Basophils Absolute: 0 10*3/uL (ref 0.0–0.1)
Eosinophils Absolute: 0.1 10*3/uL (ref 0.0–0.7)
Eosinophils Relative: 2 % (ref 0–5)
HCT: 39.8 % (ref 36.0–46.0)
Hemoglobin: 13.5 g/dL (ref 12.0–15.0)
Lymphocytes Relative: 39 % (ref 12–46)
Lymphs Abs: 2.3 10*3/uL (ref 0.7–4.0)
MCH: 26.7 pg (ref 26.0–34.0)
MCHC: 33.9 g/dL (ref 30.0–36.0)
MCV: 78.7 fL (ref 78.0–100.0)
Monocytes Absolute: 0.4 10*3/uL (ref 0.1–1.0)
Monocytes Relative: 6 % (ref 3–12)
NEUTROS PCT: 53 % (ref 43–77)
Neutro Abs: 3.1 10*3/uL (ref 1.7–7.7)
Platelets: 253 10*3/uL (ref 150–400)
RBC: 5.06 MIL/uL (ref 3.87–5.11)
RDW: 12.9 % (ref 11.5–15.5)
WBC: 6 10*3/uL (ref 4.0–10.5)

## 2014-01-27 LAB — COMPREHENSIVE METABOLIC PANEL
ALK PHOS: 79 U/L (ref 39–117)
ALT: 20 U/L (ref 0–35)
AST: 24 U/L (ref 0–37)
Albumin: 4 g/dL (ref 3.5–5.2)
Anion gap: 16 — ABNORMAL HIGH (ref 5–15)
BILIRUBIN TOTAL: 0.3 mg/dL (ref 0.3–1.2)
BUN: 10 mg/dL (ref 6–23)
CHLORIDE: 95 meq/L — AB (ref 96–112)
CO2: 24 mEq/L (ref 19–32)
Calcium: 9.6 mg/dL (ref 8.4–10.5)
Creatinine, Ser: 0.72 mg/dL (ref 0.50–1.10)
GFR calc Af Amer: >90 mL/min (ref >90)
GFR calc non Af Amer: 84 mL/min — ABNORMAL LOW (ref >90)
Glucose, Bld: 96 mg/dL (ref 70–99)
POTASSIUM: 3.3 meq/L — AB (ref 3.7–5.3)
SODIUM: 135 meq/L — AB (ref 137–147)
Total Protein: 8.2 g/dL (ref 6.0–8.3)

## 2014-01-27 NOTE — Progress Notes (Signed)
Janice Curtis presented for labwork. Labs per MD order drawn via Peripheral Line 213 gauge needle inserted in right AC  Good blood return present. Procedure without incident.  Needle removed intact. Patient tolerated procedure well.

## 2014-01-27 NOTE — Patient Instructions (Signed)
[place] Discharge Instructions  RECOMMENDATIONS MADE BY THE CONSULTANT AND ANY TEST RESULTS WILL BE SENT TO YOUR REFERRING PHYSICIAN.  We will perform lab work today and in 12 months. Your next colonoscopy is due in the year 2018, by Dr. Oneida Alar.  Return in 12 months for follow-up.  Please call the [facility] with any questions or concerns.   Thank you for choosing [facility] to provide your oncology and hematology care.  To afford each patient quality time with our providers, please arrive at least 15 minutes before your scheduled appointment time.  With your help, our goal is to use those 15 minutes to complete the necessary work-up to ensure our physicians have the information they need to help with your evaluation and healthcare recommendations.    Effective January 1st, 2014, we ask that you re-schedule your appointment with our physicians should you arrive 10 or more minutes late for your appointment.  We strive to give you quality time with our providers, and arriving late affects you and other patients whose appointments are after yours.    Again, thank you for choosing [facility].  Our hope is that these requests will decrease the amount of time that you wait before being seen by our physicians.       _____________________________________________________________  Should you have questions after your visit to [facility], please contact our office at [phone] between the hours of 8:30 a.m. and 5:00 p.m.  Voicemails left after 4:30 p.m. will not be returned until the following business day.  For prescription refill requests, have your pharmacy contact our office with your prescription refill request.

## 2014-01-28 LAB — FERRITIN: Ferritin: 258 ng/mL (ref 10–291)

## 2014-01-28 LAB — CEA: CEA: 1 ng/mL (ref 0.0–5.0)

## 2014-03-19 DIAGNOSIS — Z0000 Encounter for general adult medical examination without abnormal findings: Secondary | ICD-10-CM | POA: Diagnosis not present

## 2014-03-19 DIAGNOSIS — Z8673 Personal history of transient ischemic attack (TIA), and cerebral infarction without residual deficits: Secondary | ICD-10-CM | POA: Diagnosis not present

## 2014-10-07 DIAGNOSIS — E6609 Other obesity due to excess calories: Secondary | ICD-10-CM | POA: Diagnosis not present

## 2014-10-07 DIAGNOSIS — Z6833 Body mass index (BMI) 33.0-33.9, adult: Secondary | ICD-10-CM | POA: Diagnosis not present

## 2014-10-07 DIAGNOSIS — E559 Vitamin D deficiency, unspecified: Secondary | ICD-10-CM | POA: Diagnosis not present

## 2014-10-07 DIAGNOSIS — Z1389 Encounter for screening for other disorder: Secondary | ICD-10-CM | POA: Diagnosis not present

## 2014-10-07 DIAGNOSIS — E782 Mixed hyperlipidemia: Secondary | ICD-10-CM | POA: Diagnosis not present

## 2014-10-07 DIAGNOSIS — I10 Essential (primary) hypertension: Secondary | ICD-10-CM | POA: Diagnosis not present

## 2014-10-07 DIAGNOSIS — Z0001 Encounter for general adult medical examination with abnormal findings: Secondary | ICD-10-CM | POA: Diagnosis not present

## 2014-11-15 ENCOUNTER — Other Ambulatory Visit (HOSPITAL_COMMUNITY): Payer: Self-pay | Admitting: Family Medicine

## 2014-11-15 DIAGNOSIS — Z1231 Encounter for screening mammogram for malignant neoplasm of breast: Secondary | ICD-10-CM

## 2014-11-19 ENCOUNTER — Ambulatory Visit (HOSPITAL_COMMUNITY)
Admission: RE | Admit: 2014-11-19 | Discharge: 2014-11-19 | Disposition: A | Discharge status: Home / Self Care | Payer: Medicare Other | Source: Ambulatory Visit | Attending: Family Medicine | Admitting: Family Medicine

## 2014-11-19 DIAGNOSIS — Z1231 Encounter for screening mammogram for malignant neoplasm of breast: Secondary | ICD-10-CM | POA: Insufficient documentation

## 2015-01-13 ENCOUNTER — Encounter (HOSPITAL_COMMUNITY): Payer: Self-pay | Admitting: Emergency Medicine

## 2015-01-13 ENCOUNTER — Emergency Department (HOSPITAL_COMMUNITY)
Admission: EM | Admit: 2015-01-13 | Discharge: 2015-01-13 | Disposition: A | Discharge status: Home / Self Care | Payer: Medicare Other | Attending: Emergency Medicine | Admitting: Emergency Medicine

## 2015-01-13 DIAGNOSIS — Z7902 Long term (current) use of antithrombotics/antiplatelets: Secondary | ICD-10-CM | POA: Diagnosis not present

## 2015-01-13 DIAGNOSIS — F419 Anxiety disorder, unspecified: Secondary | ICD-10-CM | POA: Diagnosis not present

## 2015-01-13 DIAGNOSIS — E7800 Pure hypercholesterolemia, unspecified: Secondary | ICD-10-CM | POA: Insufficient documentation

## 2015-01-13 DIAGNOSIS — I10 Essential (primary) hypertension: Secondary | ICD-10-CM | POA: Diagnosis not present

## 2015-01-13 DIAGNOSIS — Z79899 Other long term (current) drug therapy: Secondary | ICD-10-CM | POA: Insufficient documentation

## 2015-01-13 DIAGNOSIS — Z8673 Personal history of transient ischemic attack (TIA), and cerebral infarction without residual deficits: Secondary | ICD-10-CM | POA: Diagnosis not present

## 2015-01-13 DIAGNOSIS — Z85038 Personal history of other malignant neoplasm of large intestine: Secondary | ICD-10-CM | POA: Insufficient documentation

## 2015-01-13 NOTE — Discharge Instructions (Signed)
Follow up with your md next week. °

## 2015-01-13 NOTE — ED Notes (Addendum)
Patient states nurse took blood pressure at home and it was 200/84, and repeat was 184/83. Patient denies symptoms. Denies pain or dizziness at this time. B/P 203/69 at triage.

## 2015-01-13 NOTE — ED Notes (Signed)
MD at bedside. 

## 2015-01-13 NOTE — ED Provider Notes (Signed)
CSN: [id number]     Arrival date & time 01/13/15  1521 History   First MD Initiated Contact with Patient 01/13/15 1616     Chief Complaint  Patient presents with  . Hypertension     (Consider location/radiation/quality/duration/timing/severity/associated sxs/prior Treatment) Patient is a 74 y.o. female presenting with hypertension. The history is provided by the patient (She complains that her blood pressure was slightly elevated today but she has no symptoms.).  Hypertension This is a recurrent problem. The current episode started 6 to 12 hours ago. The problem occurs rarely. The problem has been rapidly improving. Pertinent negatives include no chest pain, no abdominal pain and no headaches. Nothing aggravates the symptoms. Nothing relieves the symptoms.    Past Medical History  Diagnosis Date  . Colon cancer ([place])   . Stroke ([place]) 12/05/2009  . Hypercholesterolemia   . Hypertension   . Anxiety    Past Surgical History  Procedure Laterality Date  . Right colectomy  11/14/2007  . Colonoscopy  12/27/08    simple adenoma/inflammatory polyp at anastomosis  . Colonoscopy  04/23/2011    Procedure: COLONOSCOPY;  Surgeon: Dorothyann Peng, MD;  [facility];  Service: Endoscopy;  Laterality: N/A;  10:00   Family History  Problem Relation Age of Onset  . Heart attack Mother   . Stroke Father    Social History  Substance Use Topics  . Smoking status: Never Smoker   . Smokeless tobacco: Never Used  . Alcohol Use: No   OB History    No data available     Review of Systems  Constitutional: Negative for appetite change and fatigue.  HENT: Negative for congestion, ear discharge and sinus pressure.   Eyes: Negative for discharge.  Respiratory: Negative for cough.   Cardiovascular: Negative for chest pain.  Gastrointestinal: Negative for abdominal pain and diarrhea.  Genitourinary: Negative for frequency and hematuria.  Musculoskeletal: Negative for back pain.  Skin:  Negative for rash.  Neurological: Negative for seizures and headaches.  Psychiatric/Behavioral: Negative for hallucinations.      Allergies  Review of patient's allergies indicates no known allergies.  Home Medications   Prior to Admission medications   Medication Sig Start Date End Date Taking? Authorizing Provider  amLODipine (NORVASC) 10 MG tablet Take 10 mg by mouth daily. 12/29/13  Yes Historical Provider, MD  acetaminophen (TYLENOL) 500 MG tablet Take 500 mg by mouth every 6 (six) hours as needed. For pain    Historical Provider, MD  Casanthranol-Docusate Sodium 30-100 MG CAPS Take 1 tablet by mouth daily as needed. For constipation    Historical Provider, MD  Cholecalciferol (VITAMIN D3) 1000 UNITS CAPS Take 1,000 Units by mouth daily.      Historical Provider, MD  clopidogrel (PLAVIX) 75 MG tablet Take 75 mg by mouth daily.    Historical Provider, MD  Cyanocobalamin (VITAMIN B 12 PO) Take 1 tablet by mouth daily.     Historical Provider, MD  guaifenesin (ROBITUSSIN) 100 MG/5ML syrup Take by mouth.    Historical Provider, MD  loratadine (CLARITIN) 10 MG tablet Take 10 mg by mouth daily.    Historical Provider, MD  simvastatin (ZOCOR) 20 MG tablet Take 20 mg by mouth at bedtime.      Historical Provider, MD  verapamil (CALAN) 120 MG tablet Take 120 mg by mouth 3 (three) times daily.     Historical Provider, MD  VIRTUSSIN A/C 100-10 MG/5ML syrup Take 5 mLs by mouth as needed. 01/22/14  Historical Provider, MD   BP 194/75 mmHg  Pulse 67  Temp(Src) 97.9 F (36.6 C) (Oral)  Resp 18  Ht 5\' 3"  (1.6 m)  Wt 185 lb (83.915 kg)  BMI 32.78 kg/m2  SpO2 98% Physical Exam  Constitutional: She is oriented to person, place, and time. She appears well-developed.  HENT:  Head: Normocephalic.  Eyes: Conjunctivae and EOM are normal. No scleral icterus.  Neck: Neck supple. No thyromegaly present.  Cardiovascular: Normal rate and regular rhythm.  Exam reveals no gallop and no friction rub.    No murmur heard. Pulmonary/Chest: No stridor. She has no wheezes. She has no rales. She exhibits no tenderness.  Abdominal: She exhibits no distension. There is no tenderness. There is no rebound.  Musculoskeletal: Normal range of motion. She exhibits no edema.  Lymphadenopathy:    She has no cervical adenopathy.  Neurological: She is oriented to person, place, and time. She exhibits normal muscle tone. Coordination normal.  Skin: No rash noted. No erythema.  Psychiatric: She has a normal mood and affect. Her behavior is normal.    ED Course  Procedures (including critical care time) Labs Review Labs Reviewed - No data to display  Imaging Review No results found. I have personally reviewed and evaluated these images and lab results as part of my medical decision-making.   EKG Interpretation None      MDM   Final diagnoses:  Essential hypertension    Hypertension, patient's blood pressure improved in the emergency department. She was instructed to follow-up with her family doctor next week    Milton Ferguson, MD 01/13/15 1710

## 2015-01-18 DIAGNOSIS — E782 Mixed hyperlipidemia: Secondary | ICD-10-CM | POA: Diagnosis not present

## 2015-01-18 DIAGNOSIS — Z1389 Encounter for screening for other disorder: Secondary | ICD-10-CM | POA: Diagnosis not present

## 2015-01-18 DIAGNOSIS — R002 Palpitations: Secondary | ICD-10-CM | POA: Diagnosis not present

## 2015-01-18 DIAGNOSIS — I10 Essential (primary) hypertension: Secondary | ICD-10-CM | POA: Diagnosis not present

## 2015-01-18 DIAGNOSIS — Z6834 Body mass index (BMI) 34.0-34.9, adult: Secondary | ICD-10-CM | POA: Diagnosis not present

## 2015-08-14 ENCOUNTER — Emergency Department (HOSPITAL_COMMUNITY)
Admission: EM | Admit: 2015-08-14 | Discharge: 2015-08-14 | Disposition: A | Discharge status: Home / Self Care | Payer: Medicare Other | Attending: Emergency Medicine | Admitting: Emergency Medicine

## 2015-08-14 ENCOUNTER — Encounter (HOSPITAL_COMMUNITY): Payer: Self-pay

## 2015-08-14 ENCOUNTER — Emergency Department (HOSPITAL_COMMUNITY): Payer: Medicare Other

## 2015-08-14 DIAGNOSIS — I10 Essential (primary) hypertension: Secondary | ICD-10-CM | POA: Diagnosis not present

## 2015-08-14 DIAGNOSIS — Z85038 Personal history of other malignant neoplasm of large intestine: Secondary | ICD-10-CM | POA: Diagnosis not present

## 2015-08-14 DIAGNOSIS — R42 Dizziness and giddiness: Secondary | ICD-10-CM | POA: Insufficient documentation

## 2015-08-14 DIAGNOSIS — Z79899 Other long term (current) drug therapy: Secondary | ICD-10-CM | POA: Diagnosis not present

## 2015-08-14 DIAGNOSIS — Z8673 Personal history of transient ischemic attack (TIA), and cerebral infarction without residual deficits: Secondary | ICD-10-CM | POA: Insufficient documentation

## 2015-08-14 DIAGNOSIS — K529 Noninfective gastroenteritis and colitis, unspecified: Secondary | ICD-10-CM

## 2015-08-14 LAB — HEPATIC FUNCTION PANEL
ALBUMIN: 4.2 g/dL (ref 3.5–5.0)
ALK PHOS: 70 U/L (ref 38–126)
ALT: 29 U/L (ref 14–54)
AST: 34 U/L (ref 15–41)
Bilirubin, Direct: 0.1 mg/dL (ref 0.1–0.5)
Indirect Bilirubin: 0.6 mg/dL (ref 0.3–0.9)
TOTAL PROTEIN: 7.9 g/dL (ref 6.5–8.1)
Total Bilirubin: 0.7 mg/dL (ref 0.3–1.2)

## 2015-08-14 LAB — CBC WITH DIFFERENTIAL/PLATELET
BASOS PCT: 0 %
Basophils Absolute: 0 10*3/uL (ref 0.0–0.1)
EOS ABS: 0.1 10*3/uL (ref 0.0–0.7)
EOS PCT: 1 %
HCT: 42.1 % (ref 36.0–46.0)
Hemoglobin: 14.2 g/dL (ref 12.0–15.0)
LYMPHS ABS: 1.9 10*3/uL (ref 0.7–4.0)
Lymphocytes Relative: 36 %
MCH: 26.7 pg (ref 26.0–34.0)
MCHC: 33.7 g/dL (ref 30.0–36.0)
MCV: 79.3 fL (ref 78.0–100.0)
MONO ABS: 0.3 10*3/uL (ref 0.1–1.0)
MONOS PCT: 6 %
NEUTROS PCT: 57 %
Neutro Abs: 2.9 10*3/uL (ref 1.7–7.7)
PLATELETS: 199 10*3/uL (ref 150–400)
RBC: 5.31 MIL/uL — ABNORMAL HIGH (ref 3.87–5.11)
RDW: 13.5 % (ref 11.5–15.5)
WBC: 5.2 10*3/uL (ref 4.0–10.5)

## 2015-08-14 LAB — BASIC METABOLIC PANEL WITH GFR
Anion gap: 10 (ref 5–15)
BUN: 8 mg/dL (ref 6–20)
CO2: 23 mmol/L (ref 22–32)
Calcium: 9 mg/dL (ref 8.9–10.3)
Chloride: 101 mmol/L (ref 101–111)
Creatinine, Ser: 0.7 mg/dL (ref 0.44–1.00)
GFR calc Af Amer: >60 mL/min
GFR calc non Af Amer: >60 mL/min
Glucose, Bld: 122 mg/dL — ABNORMAL HIGH (ref 65–99)
Potassium: 3.3 mmol/L — ABNORMAL LOW (ref 3.5–5.1)
Sodium: 134 mmol/L — ABNORMAL LOW (ref 135–145)

## 2015-08-14 MED ORDER — LABETALOL HCL 5 MG/ML IV SOLN
20.0000 mg | Freq: Once | INTRAVENOUS | Status: AC
  Administered 2015-08-14: 20 mg via INTRAVENOUS
  Filled 2015-08-14: qty 4

## 2015-08-14 MED ORDER — ONDANSETRON 4 MG PO TBDP
ORAL_TABLET | ORAL | Status: DC
Start: 2015-08-14 — End: 2020-02-01

## 2015-08-14 MED ORDER — SODIUM CHLORIDE 0.9 % IV BOLUS (SEPSIS)
500.0000 mL | Freq: Once | INTRAVENOUS | Status: AC
  Administered 2015-08-14: 500 mL via INTRAVENOUS

## 2015-08-14 NOTE — Discharge Instructions (Signed)
Follow up with your md in 2-3 days °

## 2015-08-14 NOTE — ED Notes (Signed)
Pt reports dizziness and diarrhea that started today when she woke up.  Pt says has been taking her bp and it has been elevated today.  Denies headache.  Denies chest pain or SOB.  Pt says has felt tired all day.

## 2015-08-14 NOTE — ED Provider Notes (Signed)
CSN: [id number]     Arrival date & time 08/14/15  1727 History   First MD Initiated Contact with Patient 08/14/15 1747     Chief Complaint  Patient presents with  . Hypertension     (Consider location/radiation/quality/duration/timing/severity/associated sxs/prior Treatment) Patient is a 75 y.o. female presenting with hypertension. The history is provided by the patient (Patient complains of nausea vomiting and some dizziness. Also diarrhea).  Hypertension This is a new problem. The current episode started 12 to 24 hours ago. The problem occurs constantly. The problem has not changed since onset.Pertinent negatives include no chest pain, no abdominal pain and no headaches. Nothing aggravates the symptoms. Nothing relieves the symptoms.    Past Medical History  Diagnosis Date  . Colon cancer ([place])   . Stroke ([place]) 12/05/2009  . Hypercholesterolemia   . Hypertension   . Anxiety    Past Surgical History  Procedure Laterality Date  . Right colectomy  11/14/2007  . Colonoscopy  12/27/08    simple adenoma/inflammatory polyp at anastomosis  . Colonoscopy  04/23/2011    Procedure: COLONOSCOPY;  Surgeon: Dorothyann Peng, MD;  Location[facility];  Service: Endoscopy;  Laterality: N/A;  10:00   Family History  Problem Relation Age of Onset  . Heart attack Mother   . Stroke Father    Social History  Substance Use Topics  . Smoking status: Never Smoker   . Smokeless tobacco: Never Used  . Alcohol Use: No   OB History    No data available     Review of Systems  Constitutional: Negative for appetite change and fatigue.  HENT: Negative for congestion, ear discharge and sinus pressure.   Eyes: Negative for discharge.  Respiratory: Negative for cough.   Cardiovascular: Negative for chest pain.  Gastrointestinal: Positive for nausea and diarrhea. Negative for abdominal pain.  Genitourinary: Negative for frequency and hematuria.  Musculoskeletal: Negative for back pain.  Skin:  Negative for rash.  Neurological: Positive for dizziness. Negative for seizures and headaches.  Psychiatric/Behavioral: Negative for hallucinations.      Allergies  Review of patient's allergies indicates no known allergies.  Home Medications   Prior to Admission medications   Medication Sig Start Date End Date Taking? Authorizing Provider  acetaminophen (TYLENOL) 500 MG tablet Take 500 mg by mouth every 6 (six) hours as needed. For pain    Historical Provider, MD  amLODipine (NORVASC) 10 MG tablet Take 10 mg by mouth daily. 12/29/13   Historical Provider, MD  Casanthranol-Docusate Sodium 30-100 MG CAPS Take 1 tablet by mouth daily as needed. For constipation    Historical Provider, MD  Cholecalciferol (VITAMIN D3) 1000 UNITS CAPS Take 1,000 Units by mouth daily.      Historical Provider, MD  clopidogrel (PLAVIX) 75 MG tablet Take 75 mg by mouth daily.    Historical Provider, MD  Cyanocobalamin (VITAMIN B 12 PO) Take 1 tablet by mouth daily.     Historical Provider, MD  guaifenesin (ROBITUSSIN) 100 MG/5ML syrup Take by mouth.    Historical Provider, MD  loratadine (CLARITIN) 10 MG tablet Take 10 mg by mouth daily.    Historical Provider, MD  ondansetron (ZOFRAN ODT) 4 MG disintegrating tablet 4mg  ODT q4 hours prn nausea/vomit 08/14/15   Milton Ferguson, MD  simvastatin (ZOCOR) 20 MG tablet Take 20 mg by mouth at bedtime.      Historical Provider, MD  verapamil (CALAN) 120 MG tablet Take 120 mg by mouth 3 (three) times daily.  Historical Provider, MD  VIRTUSSIN A/C 100-10 MG/5ML syrup Take 5 mLs by mouth as needed. 01/22/14   Historical Provider, MD   BP 157/75 mmHg  Pulse 62  Temp(Src) 98 F (36.7 C) (Oral)  Resp 17  Ht 5\' 3"  (1.6 m)  Wt 185 lb (83.915 kg)  BMI 32.78 kg/m2  SpO2 95% Physical Exam  Constitutional: She is oriented to person, place, and time. She appears well-developed.  HENT:  Head: Normocephalic.  Eyes: Conjunctivae and EOM are normal. No scleral icterus.   Neck: Neck supple. No thyromegaly present.  Cardiovascular: Normal rate and regular rhythm.  Exam reveals no gallop and no friction rub.   No murmur heard. Pulmonary/Chest: No stridor. She has no wheezes. She has no rales. She exhibits no tenderness.  Abdominal: She exhibits no distension. There is no tenderness. There is no rebound.  Musculoskeletal: Normal range of motion. She exhibits no edema.  Lymphadenopathy:    She has no cervical adenopathy.  Neurological: She is oriented to person, place, and time. She exhibits normal muscle tone. Coordination normal.  Skin: No rash noted. No erythema.  Psychiatric: She has a normal mood and affect. Her behavior is normal.    ED Course  Procedures (including critical care time) Labs Review Labs Reviewed  CBC WITH DIFFERENTIAL/PLATELET - Abnormal; Notable for the following:    RBC 5.31 (*)    All other components within normal limits  BASIC METABOLIC PANEL - Abnormal; Notable for the following:    Sodium 134 (*)    Potassium 3.3 (*)    Glucose, Bld 122 (*)    All other components within normal limits  HEPATIC FUNCTION PANEL    Imaging Review Ct Head Wo Contrast  08/14/2015  CLINICAL DATA:  Dizziness and diarrhea starting today. EXAM: CT HEAD WITHOUT CONTRAST TECHNIQUE: Contiguous axial images were obtained from the base of the skull through the vertex without intravenous contrast. COMPARISON:  Multiple exams, including 12/06/2009 MRI and 12/05/2009 CT FINDINGS: Small residua from prior posterior limb right internal capsule lacunar infarct that was shown on 12/06/2009. Otherwise, the brainstem, cerebellum, cerebral peduncles, thalami, basal ganglia, basilar cisterns, and ventricular system appear within normal limits. Mild chronic ischemic microvascular white matter disease. No intracranial hemorrhage, mass lesion, or acute CVA. IMPRESSION: 1. No acute intracranial findings. 2. Remote posterior limb right internal capsule lacunar infarct. Mild  chronic ischemic microvascular white matter disease. Electronically Signed   By: Van Clines M.D.   On: 08/14/2015 18:52   I have personally reviewed and evaluated these images and lab results as part of my medical decision-making.   EKG Interpretation   Date/Time:  Sunday August 14 2015 17:48:57 EDT Ventricular Rate:  51 PR Interval:  142 QRS Duration: 117 QT Interval:  596 QTC Calculation: 549 R Axis:   -58 Text Interpretation:  Sinus rhythm LVH with IVCD, LAD and secondary repol  abnrm Prolonged QT interval Confirmed by Roxanne Orner  MD, Sanjuana Mruk [phone]) on  08/14/2015 7:13:17 PM      MDM   Final diagnoses:  Essential hypertension  Gastroenteritis    Patient's blood pressure improved with labetalol. CT head negative labs unremarkable patient gastroenteritis and hypertension. She will follow-up with her PCP. Patient given Zofran   Milton Ferguson, MD 08/14/15 1924

## 2015-10-28 DIAGNOSIS — Z1389 Encounter for screening for other disorder: Secondary | ICD-10-CM | POA: Diagnosis not present

## 2015-10-28 DIAGNOSIS — Z0001 Encounter for general adult medical examination with abnormal findings: Secondary | ICD-10-CM | POA: Diagnosis not present

## 2015-10-28 DIAGNOSIS — E559 Vitamin D deficiency, unspecified: Secondary | ICD-10-CM | POA: Diagnosis not present

## 2015-10-28 DIAGNOSIS — E782 Mixed hyperlipidemia: Secondary | ICD-10-CM | POA: Diagnosis not present

## 2015-10-28 DIAGNOSIS — I10 Essential (primary) hypertension: Secondary | ICD-10-CM | POA: Diagnosis not present

## 2015-11-29 ENCOUNTER — Other Ambulatory Visit (HOSPITAL_COMMUNITY): Payer: Self-pay | Admitting: Internal Medicine

## 2015-11-29 DIAGNOSIS — Z1231 Encounter for screening mammogram for malignant neoplasm of breast: Secondary | ICD-10-CM

## 2015-12-05 ENCOUNTER — Ambulatory Visit (INDEPENDENT_AMBULATORY_CARE_PROVIDER_SITE_OTHER): Payer: Medicare Other | Admitting: Cardiovascular Disease

## 2015-12-05 ENCOUNTER — Encounter: Payer: Self-pay | Admitting: Cardiovascular Disease

## 2015-12-05 VITALS — BP 164/92 | HR 67 | Ht 63.0 in | Wt 188.0 lb

## 2015-12-05 DIAGNOSIS — I10 Essential (primary) hypertension: Secondary | ICD-10-CM

## 2015-12-05 DIAGNOSIS — R011 Cardiac murmur, unspecified: Secondary | ICD-10-CM

## 2015-12-05 DIAGNOSIS — E7800 Pure hypercholesterolemia, unspecified: Secondary | ICD-10-CM

## 2015-12-05 NOTE — Patient Instructions (Signed)
Your physician recommends that you schedule a follow-up appointment in: 2 months   Your physician has requested that you have an echocardiogram. Echocardiography is a painless test that uses sound waves to create images of your heart. It provides your doctor with information about the size and shape of your heart and how well your heart's chambers and valves are working. This procedure takes approximately one hour. There are no restrictions for this procedure.     Your physician recommends that you continue on your current medications as directed. Please refer to the Current Medication list given to you today.    Thank you for choosing [facility] !

## 2015-12-05 NOTE — Progress Notes (Signed)
CARDIOLOGY CONSULT NOTE  Patient ID: Janice Curtis MRN: [id number] DOB/AGE: 10-07-1940 75 y.o.  Admit date: (Not on file) Primary Physician: [place] Referring Physician: Delman Cheadle PA-C  Reason for Consultation: cardiac murmur  HPI: 75 year old woman with hypertension and hyperlipidemia referred for cardiac murmur.   Recent ECG performed in August showed sinus rhythm with nonspecific ST segment abnormalities.   Recent labs show hemoglobin 13.8, platelet's 202, BUN 10, creatinine 0.67, total cholesterol 249, triglycerides 206, HDL 46, LDL 162, TSH 4.74.  Echocardiogram 12/06/09 normal left ventricular systolic function and regional wall motion, LVEF 123456, grade 1 diastolic dysfunction, trivial mitral and tricuspid regurgitation.  She denies chest pain. She has occasional shortness of breath with exertion but this is not consistent. Occasionally has mild ankle swelling. Denies orthopnea and paroxysmal nocturnal dyspnea.    No Known Allergies  Current Outpatient Prescriptions  Medication Sig Dispense Refill  . acetaminophen (TYLENOL) 500 MG tablet Take 500 mg by mouth every 6 (six) hours as needed. For pain    . amLODipine (NORVASC) 10 MG tablet Take 10 mg by mouth daily.  2  . Casanthranol-Docusate Sodium 30-100 MG CAPS Take 1 tablet by mouth daily as needed. For constipation    . Cholecalciferol (VITAMIN D3) 1000 UNITS CAPS Take 1,000 Units by mouth daily.      . clopidogrel (PLAVIX) 75 MG tablet Take 75 mg by mouth daily.    . Cyanocobalamin (VITAMIN B 12 PO) Take 1 tablet by mouth daily.     Marland Kitchen guaifenesin (ROBITUSSIN) 100 MG/5ML syrup Take by mouth.    . loratadine (CLARITIN) 10 MG tablet Take 10 mg by mouth daily.    . ondansetron (ZOFRAN ODT) 4 MG disintegrating tablet 4mg  ODT q4 hours prn nausea/vomit 12 tablet 0  . simvastatin (ZOCOR) 20 MG tablet Take 20 mg by mouth at bedtime.      . verapamil (CALAN) 120 MG tablet Take 120 mg by  mouth 3 (three) times daily.     Marland Kitchen VIRTUSSIN A/C 100-10 MG/5ML syrup Take 5 mLs by mouth as needed.  0   No current facility-administered medications for this visit.     Past Medical History:  Diagnosis Date  . Anxiety   . Colon cancer ([place])   . Hypercholesterolemia   . Hypertension   . Stroke [facility]) 12/05/2009    Past Surgical History:  Procedure Laterality Date  . COLONOSCOPY  12/27/08   simple adenoma/inflammatory polyp at anastomosis  . COLONOSCOPY  04/23/2011   Procedure: COLONOSCOPY;  Surgeon: Dorothyann Peng, MD;  Location[facility];  Service: Endoscopy;  Laterality: N/A;  10:00  . RIGHT COLECTOMY  11/14/2007    Social History   Social History  . Marital status: Divorced    Spouse name: N/A  . Number of children: N/A  . Years of education: N/A   Occupational History  . Not on file.   Social History Main Topics  . Smoking status: Never Smoker  . Smokeless tobacco: Never Used  . Alcohol use No  . Drug use: No  . Sexual activity: Not on file   Other Topics Concern  . Not on file   Social History Narrative   RAISES HER GRANDSON SINCE HE WAS 2 YO. HE IS 5 YO NOW.     No family history of premature CAD in 1st degree relatives.  Prior to Admission medications   Medication Sig Start Date End Date Taking? Authorizing Provider  acetaminophen (TYLENOL) 500 MG tablet Take 500 mg by mouth every 6 (six) hours as needed. For pain   Yes Historical Provider, MD  amLODipine (NORVASC) 10 MG tablet Take 10 mg by mouth daily. 12/29/13  Yes Historical Provider, MD  Casanthranol-Docusate Sodium 30-100 MG CAPS Take 1 tablet by mouth daily as needed. For constipation   Yes Historical Provider, MD  Cholecalciferol (VITAMIN D3) 1000 UNITS CAPS Take 1,000 Units by mouth daily.     Yes Historical Provider, MD  clopidogrel (PLAVIX) 75 MG tablet Take 75 mg by mouth daily.   Yes Historical Provider, MD  Cyanocobalamin (VITAMIN B 12 PO) Take 1 tablet by mouth daily.    Yes Historical  Provider, MD  guaifenesin (ROBITUSSIN) 100 MG/5ML syrup Take by mouth.   Yes Historical Provider, MD  loratadine (CLARITIN) 10 MG tablet Take 10 mg by mouth daily.   Yes Historical Provider, MD  ondansetron (ZOFRAN ODT) 4 MG disintegrating tablet 4mg  ODT q4 hours prn nausea/vomit 08/14/15  Yes Milton Ferguson, MD  simvastatin (ZOCOR) 20 MG tablet Take 20 mg by mouth at bedtime.     Yes Historical Provider, MD  verapamil (CALAN) 120 MG tablet Take 120 mg by mouth 3 (three) times daily.    Yes Historical Provider, MD  VIRTUSSIN A/C 100-10 MG/5ML syrup Take 5 mLs by mouth as needed. 01/22/14  Yes Historical Provider, MD     Review of systems complete and found to be negative unless listed above in HPI     Physical exam Blood pressure (!) 164/92, pulse 67, height 5\' 3"  (1.6 m), weight 188 lb (85.3 kg), SpO2 99 %. General: NAD Neck: No JVD, no thyromegaly or thyroid nodule.  Lungs: Clear to auscultation bilaterally with normal respiratory effort. CV: Nondisplaced PMI. Regular rate and rhythm, normal S1/S2, no XX123456, 2/6 systolic murmur over LUSB.  No peripheral edema.  No carotid bruit.  Normal pedal pulses.  Abdomen: Soft, nontender, no hepatosplenomegaly, no distention.  Skin: Intact without lesions or rashes.  Neurologic: Alert and oriented x 3.  Psych: Normal affect. Extremities: No clubbing or cyanosis.  HEENT: Normal.   ECG: Most recent ECG reviewed.  Labs:   Lab Results  Component Value Date   WBC 5.2 08/14/2015   HGB 14.2 08/14/2015   HCT 42.1 08/14/2015   MCV 79.3 08/14/2015   PLT 199 08/14/2015   No results for input(s): NA, K, CL, CO2, BUN, CREATININE, CALCIUM, PROT, BILITOT, ALKPHOS, ALT, AST, GLUCOSE in the last 168 hours.  Invalid input(s): LABALBU Lab Results  Component Value Date   CKTOTAL 80 12/06/2009   CKMB 1.9 12/06/2009   TROPONINI 0.01        NO INDICATION OF MYOCARDIAL INJURY. 12/06/2009    Lab Results  Component Value Date   CHOL  12/06/2009    196         ATP III CLASSIFICATION:  <200     mg/dL   Desirable  200-239  mg/dL   Borderline High  >=240    mg/dL   High          Lab Results  Component Value Date   HDL 51 12/06/2009   Lab Results  Component Value Date   LDLCALC (H) 12/06/2009    127        Total Cholesterol/HDL:CHD Risk Coronary Heart Disease Risk Table                     Men   Women  1/2  Average Risk   3.4   3.3  Average Risk       5.0   4.4  2 X Average Risk   9.6   7.1  3 X Average Risk  23.4   11.0        Use the calculated Patient Ratio above and the CHD Risk Table to determine the patient's CHD Risk.        ATP III CLASSIFICATION (LDL):  <100     mg/dL   Optimal  100-129  mg/dL   Near or Above                    Optimal  130-159  mg/dL   Borderline  160-189  mg/dL   High  >190     mg/dL   Very High   Lab Results  Component Value Date   TRIG 92 12/06/2009   Lab Results  Component Value Date   CHOLHDL 3.8 12/06/2009   No results found for: LDLDIRECT       Studies: No results found.  ASSESSMENT AND PLAN:  1. Cardiac murmur: I will order a 2-D echocardiogram with Doppler to evaluate cardiac structure, function, and regional wall motion.  2. HTN: Elevated. Needs further monitoring and perhaps medication titration. Says she forgot to take meds this morning and BP was 140/70 prior to arriving.  3. Hyperlipidemia: On simvastatin. Says she is on a new medication as well.  Dispo: fu 2 months.   Signed: Kate Sable, M.D., F.A.C.C.  12/05/2015, 8:44 AM

## 2015-12-09 ENCOUNTER — Ambulatory Visit (HOSPITAL_COMMUNITY)
Admission: RE | Admit: 2015-12-09 | Discharge: 2015-12-09 | Disposition: A | Discharge status: Home / Self Care | Payer: Medicare Other | Source: Ambulatory Visit | Attending: Internal Medicine | Admitting: Internal Medicine

## 2015-12-09 DIAGNOSIS — Z1231 Encounter for screening mammogram for malignant neoplasm of breast: Secondary | ICD-10-CM | POA: Insufficient documentation

## 2015-12-13 ENCOUNTER — Ambulatory Visit (HOSPITAL_COMMUNITY)
Admission: RE | Admit: 2015-12-13 | Discharge: 2015-12-13 | Disposition: A | Discharge status: Home / Self Care | Payer: Medicare Other | Source: Ambulatory Visit | Attending: Cardiovascular Disease | Admitting: Cardiovascular Disease

## 2015-12-13 DIAGNOSIS — I371 Nonrheumatic pulmonary valve insufficiency: Secondary | ICD-10-CM | POA: Diagnosis not present

## 2015-12-13 DIAGNOSIS — R011 Cardiac murmur, unspecified: Secondary | ICD-10-CM | POA: Insufficient documentation

## 2015-12-13 DIAGNOSIS — I517 Cardiomegaly: Secondary | ICD-10-CM | POA: Diagnosis not present

## 2015-12-13 DIAGNOSIS — I361 Nonrheumatic tricuspid (valve) insufficiency: Secondary | ICD-10-CM | POA: Insufficient documentation

## 2015-12-13 DIAGNOSIS — I358 Other nonrheumatic aortic valve disorders: Secondary | ICD-10-CM | POA: Diagnosis not present

## 2015-12-13 NOTE — Progress Notes (Signed)
*  PRELIMINARY RESULTS* Echocardiogram 2D Echocardiogram has been performed.  Janice Curtis 12/13/2015, 11:03 AM

## 2016-01-16 DIAGNOSIS — J209 Acute bronchitis, unspecified: Secondary | ICD-10-CM | POA: Diagnosis not present

## 2016-01-16 DIAGNOSIS — R062 Wheezing: Secondary | ICD-10-CM | POA: Diagnosis not present

## 2016-02-14 ENCOUNTER — Encounter: Payer: Self-pay | Admitting: Gastroenterology

## 2016-02-16 ENCOUNTER — Encounter: Payer: Self-pay | Admitting: Cardiovascular Disease

## 2016-02-16 ENCOUNTER — Ambulatory Visit (INDEPENDENT_AMBULATORY_CARE_PROVIDER_SITE_OTHER): Payer: Medicare Other | Admitting: Cardiovascular Disease

## 2016-02-16 VITALS — BP 150/80 | HR 66 | Ht 63.0 in | Wt 181.0 lb

## 2016-02-16 DIAGNOSIS — R011 Cardiac murmur, unspecified: Secondary | ICD-10-CM

## 2016-02-16 DIAGNOSIS — I071 Rheumatic tricuspid insufficiency: Secondary | ICD-10-CM

## 2016-02-16 DIAGNOSIS — E7800 Pure hypercholesterolemia, unspecified: Secondary | ICD-10-CM | POA: Diagnosis not present

## 2016-02-16 DIAGNOSIS — I10 Essential (primary) hypertension: Secondary | ICD-10-CM

## 2016-02-16 NOTE — Patient Instructions (Signed)
Your physician recommends that you schedule a follow-up appointment in: if needed     Your physician recommends that you continue on your current medications as directed. Please refer to the Current Medication list given to you today.     Thank you for choosing [facility] !

## 2016-02-16 NOTE — Progress Notes (Signed)
SUBJECTIVE: The patient returns for follow-up after undergoing cardiovascular testing performed for the evaluation of murmur.  Echocardiogram 12/13/15: Normal left ventricular systolic function and regional wall motion, LVEF 65-70%, mild to moderate tricuspid regurgitation.  She denies chest pain. She has occasional shortness of breath with exertion but this is not consistent. Occasionally has mild ankle swelling. Denies orthopnea and paroxysmal nocturnal dyspnea.  Says BP was 138/71 at home earlier.   Review of Systems: As per "subjective", otherwise negative.  No Known Allergies  Current Outpatient Prescriptions  Medication Sig Dispense Refill  . acetaminophen (TYLENOL) 500 MG tablet Take 500 mg by mouth every 6 (six) hours as needed. For pain    . amLODipine (NORVASC) 10 MG tablet Take 10 mg by mouth daily.  2  . Casanthranol-Docusate Sodium 30-100 MG CAPS Take 1 tablet by mouth daily as needed. For constipation    . Cholecalciferol (VITAMIN D3) 1000 UNITS CAPS Take 1,000 Units by mouth daily.      . clopidogrel (PLAVIX) 75 MG tablet Take 75 mg by mouth daily.    . Cyanocobalamin (VITAMIN B 12 PO) Take 1 tablet by mouth daily.     Marland Kitchen guaifenesin (ROBITUSSIN) 100 MG/5ML syrup Take by mouth.    . loratadine (CLARITIN) 10 MG tablet Take 10 mg by mouth daily.    . ondansetron (ZOFRAN ODT) 4 MG disintegrating tablet 4mg  ODT q4 hours prn nausea/vomit 12 tablet 0  . simvastatin (ZOCOR) 20 MG tablet Take 20 mg by mouth at bedtime.      . verapamil (CALAN) 120 MG tablet Take 120 mg by mouth 3 (three) times daily.     Marland Kitchen VIRTUSSIN A/C 100-10 MG/5ML syrup Take 5 mLs by mouth as needed.  0   No current facility-administered medications for this visit.     Past Medical History:  Diagnosis Date  . Anxiety   . Colon cancer ([place])   . Hypercholesterolemia   . Hypertension   . Stroke [facility]) 12/05/2009    Past Surgical History:  Procedure Laterality Date  . COLONOSCOPY  12/27/08   simple adenoma/inflammatory polyp at anastomosis  . COLONOSCOPY  04/23/2011   Procedure: COLONOSCOPY;  Surgeon: Dorothyann Peng, MD;  [facility];  Service: Endoscopy;  Laterality: N/A;  10:00  . RIGHT COLECTOMY  11/14/2007    Social History   Social History  . Marital status: Divorced    Spouse name: N/A  . Number of children: N/A  . Years of education: N/A   Occupational History  . Not on file.   Social History Main Topics  . Smoking status: Never Smoker  . Smokeless tobacco: Never Used  . Alcohol use No  . Drug use: No  . Sexual activity: Yes   Other Topics Concern  . Not on file   Social History Narrative   RAISES HER GRANDSON SINCE HE WAS 2 YO. HE IS 6 YO NOW.     Vitals:   02/16/16 1026  BP: (!) 150/80  Pulse: 66  SpO2: 98%  Weight: 181 lb (82.1 kg)  Height: 5\' 3"  (1.6 m)    PHYSICAL EXAM General: NAD Neck: No JVD, no thyromegaly or thyroid nodule.  Lungs: Clear to auscultation bilaterally with normal respiratory effort. CV: Nondisplaced PMI. Regular rate and rhythm, normal S1/S2, no XX123456, 1/6 systolic murmur over LUSB.  No peripheral edema.   Abdomen: Soft, nontender, no hepatosplenomegaly, no distention.  Skin: Intact without lesions or rashes.  Neurologic: Alert and  oriented x 3.  Psych: Normal affect. Extremities: No clubbing or cyanosis.  HEENT: Normal.     ECG: Most recent ECG reviewed.      ASSESSMENT AND PLAN: 1. Cardiac murmur/mild to moderate tricuspid regurgitation: Echo can be repeated in 3-5 years should she develop exertional dyspnea.  2. HTN: Elevated again. Would recommend addition of diuretic. Will defer to PCP. Says BP was 138/71 at home earlier.  3. Hyperlipidemia: On simvastatin.   Dispo: fu prn   Kate Sable, M.D., F.A.C.C.

## 2016-11-27 ENCOUNTER — Other Ambulatory Visit (HOSPITAL_COMMUNITY): Payer: Self-pay | Admitting: Internal Medicine

## 2016-11-27 DIAGNOSIS — Z1231 Encounter for screening mammogram for malignant neoplasm of breast: Secondary | ICD-10-CM

## 2016-12-10 ENCOUNTER — Ambulatory Visit (HOSPITAL_COMMUNITY)
Admission: RE | Admit: 2016-12-10 | Discharge: 2016-12-10 | Disposition: A | Discharge status: Home / Self Care | Payer: Medicare Other | Source: Ambulatory Visit | Attending: Internal Medicine | Admitting: Internal Medicine

## 2016-12-10 DIAGNOSIS — Z1231 Encounter for screening mammogram for malignant neoplasm of breast: Secondary | ICD-10-CM | POA: Insufficient documentation

## 2017-02-08 DIAGNOSIS — Z0001 Encounter for general adult medical examination with abnormal findings: Secondary | ICD-10-CM | POA: Diagnosis not present

## 2017-02-08 DIAGNOSIS — E782 Mixed hyperlipidemia: Secondary | ICD-10-CM | POA: Diagnosis not present

## 2017-02-08 DIAGNOSIS — Z1389 Encounter for screening for other disorder: Secondary | ICD-10-CM | POA: Diagnosis not present

## 2017-02-08 DIAGNOSIS — I10 Essential (primary) hypertension: Secondary | ICD-10-CM | POA: Diagnosis not present

## 2017-02-08 DIAGNOSIS — H9192 Unspecified hearing loss, left ear: Secondary | ICD-10-CM | POA: Diagnosis not present

## 2017-07-09 DIAGNOSIS — I10 Essential (primary) hypertension: Secondary | ICD-10-CM | POA: Diagnosis not present

## 2017-07-09 DIAGNOSIS — Z85038 Personal history of other malignant neoplasm of large intestine: Secondary | ICD-10-CM | POA: Diagnosis not present

## 2017-07-09 DIAGNOSIS — Z8249 Family history of ischemic heart disease and other diseases of the circulatory system: Secondary | ICD-10-CM | POA: Diagnosis not present

## 2017-07-09 DIAGNOSIS — R69 Illness, unspecified: Secondary | ICD-10-CM | POA: Diagnosis not present

## 2017-07-09 DIAGNOSIS — E669 Obesity, unspecified: Secondary | ICD-10-CM | POA: Diagnosis not present

## 2017-07-09 DIAGNOSIS — I69334 Monoplegia of upper limb following cerebral infarction affecting left non-dominant side: Secondary | ICD-10-CM | POA: Diagnosis not present

## 2017-07-09 DIAGNOSIS — Z6833 Body mass index (BMI) 33.0-33.9, adult: Secondary | ICD-10-CM | POA: Diagnosis not present

## 2017-07-09 DIAGNOSIS — E785 Hyperlipidemia, unspecified: Secondary | ICD-10-CM | POA: Diagnosis not present

## 2017-07-09 DIAGNOSIS — Z7902 Long term (current) use of antithrombotics/antiplatelets: Secondary | ICD-10-CM | POA: Diagnosis not present

## 2017-07-09 DIAGNOSIS — Z823 Family history of stroke: Secondary | ICD-10-CM | POA: Diagnosis not present

## 2017-09-16 ENCOUNTER — Ambulatory Visit (HOSPITAL_COMMUNITY)
Admission: RE | Admit: 2017-09-16 | Discharge: 2017-09-16 | Disposition: A | Discharge status: Home / Self Care | Payer: Medicare HMO | Source: Ambulatory Visit | Attending: Physician Assistant | Admitting: Physician Assistant

## 2017-09-16 ENCOUNTER — Other Ambulatory Visit (HOSPITAL_COMMUNITY): Payer: Self-pay | Admitting: Physician Assistant

## 2017-09-16 DIAGNOSIS — R079 Chest pain, unspecified: Secondary | ICD-10-CM | POA: Insufficient documentation

## 2017-09-16 DIAGNOSIS — Z1389 Encounter for screening for other disorder: Secondary | ICD-10-CM | POA: Diagnosis not present

## 2017-09-16 DIAGNOSIS — R0602 Shortness of breath: Secondary | ICD-10-CM

## 2017-09-16 DIAGNOSIS — R918 Other nonspecific abnormal finding of lung field: Secondary | ICD-10-CM | POA: Diagnosis not present

## 2017-09-16 DIAGNOSIS — Z6833 Body mass index (BMI) 33.0-33.9, adult: Secondary | ICD-10-CM | POA: Diagnosis not present

## 2017-09-16 DIAGNOSIS — Z8673 Personal history of transient ischemic attack (TIA), and cerebral infarction without residual deficits: Secondary | ICD-10-CM | POA: Diagnosis not present

## 2017-09-16 DIAGNOSIS — R0789 Other chest pain: Secondary | ICD-10-CM | POA: Diagnosis not present

## 2017-09-16 DIAGNOSIS — E6609 Other obesity due to excess calories: Secondary | ICD-10-CM | POA: Diagnosis not present

## 2017-10-02 ENCOUNTER — Other Ambulatory Visit (HOSPITAL_COMMUNITY): Payer: Self-pay | Admitting: Physician Assistant

## 2017-10-02 ENCOUNTER — Ambulatory Visit (HOSPITAL_COMMUNITY)
Admission: RE | Admit: 2017-10-02 | Discharge: 2017-10-02 | Disposition: A | Discharge status: Home / Self Care | Payer: Medicare HMO | Source: Ambulatory Visit | Attending: Physician Assistant | Admitting: Physician Assistant

## 2017-10-02 DIAGNOSIS — R918 Other nonspecific abnormal finding of lung field: Secondary | ICD-10-CM | POA: Insufficient documentation

## 2017-10-02 DIAGNOSIS — R05 Cough: Secondary | ICD-10-CM | POA: Diagnosis not present

## 2017-10-02 DIAGNOSIS — J189 Pneumonia, unspecified organism: Secondary | ICD-10-CM

## 2017-10-02 DIAGNOSIS — I517 Cardiomegaly: Secondary | ICD-10-CM | POA: Insufficient documentation

## 2017-10-30 ENCOUNTER — Other Ambulatory Visit (HOSPITAL_COMMUNITY): Payer: Self-pay | Admitting: Physician Assistant

## 2017-10-30 DIAGNOSIS — Z780 Asymptomatic menopausal state: Secondary | ICD-10-CM

## 2017-11-06 ENCOUNTER — Ambulatory Visit (HOSPITAL_COMMUNITY)
Admission: RE | Admit: 2017-11-06 | Discharge: 2017-11-06 | Disposition: A | Discharge status: Home / Self Care | Payer: Medicare HMO | Source: Ambulatory Visit | Attending: Physician Assistant | Admitting: Physician Assistant

## 2017-11-06 DIAGNOSIS — M8589 Other specified disorders of bone density and structure, multiple sites: Secondary | ICD-10-CM | POA: Diagnosis not present

## 2017-11-06 DIAGNOSIS — M85832 Other specified disorders of bone density and structure, left forearm: Secondary | ICD-10-CM | POA: Insufficient documentation

## 2017-11-06 DIAGNOSIS — Z780 Asymptomatic menopausal state: Secondary | ICD-10-CM | POA: Insufficient documentation

## 2017-12-05 DIAGNOSIS — R69 Illness, unspecified: Secondary | ICD-10-CM | POA: Diagnosis not present

## 2017-12-10 ENCOUNTER — Other Ambulatory Visit (HOSPITAL_COMMUNITY): Payer: Self-pay | Admitting: Internal Medicine

## 2017-12-10 DIAGNOSIS — Z1231 Encounter for screening mammogram for malignant neoplasm of breast: Secondary | ICD-10-CM

## 2017-12-18 ENCOUNTER — Ambulatory Visit (HOSPITAL_COMMUNITY)
Admission: RE | Admit: 2017-12-18 | Discharge: 2017-12-18 | Disposition: A | Discharge status: Home / Self Care | Payer: Medicare HMO | Source: Ambulatory Visit | Attending: Internal Medicine | Admitting: Internal Medicine

## 2017-12-18 DIAGNOSIS — Z1231 Encounter for screening mammogram for malignant neoplasm of breast: Secondary | ICD-10-CM | POA: Diagnosis not present

## 2018-02-18 DIAGNOSIS — E6609 Other obesity due to excess calories: Secondary | ICD-10-CM | POA: Diagnosis not present

## 2018-02-18 DIAGNOSIS — Z6834 Body mass index (BMI) 34.0-34.9, adult: Secondary | ICD-10-CM | POA: Diagnosis not present

## 2018-02-18 DIAGNOSIS — I10 Essential (primary) hypertension: Secondary | ICD-10-CM | POA: Diagnosis not present

## 2018-02-18 DIAGNOSIS — Z0001 Encounter for general adult medical examination with abnormal findings: Secondary | ICD-10-CM | POA: Diagnosis not present

## 2018-02-18 DIAGNOSIS — Z1389 Encounter for screening for other disorder: Secondary | ICD-10-CM | POA: Diagnosis not present

## 2018-02-18 DIAGNOSIS — E669 Obesity, unspecified: Secondary | ICD-10-CM | POA: Diagnosis not present

## 2018-02-18 DIAGNOSIS — E782 Mixed hyperlipidemia: Secondary | ICD-10-CM | POA: Diagnosis not present

## 2018-11-26 ENCOUNTER — Other Ambulatory Visit (HOSPITAL_COMMUNITY): Payer: Self-pay | Admitting: Internal Medicine

## 2018-11-26 DIAGNOSIS — Z1231 Encounter for screening mammogram for malignant neoplasm of breast: Secondary | ICD-10-CM

## 2018-11-27 DIAGNOSIS — R69 Illness, unspecified: Secondary | ICD-10-CM | POA: Diagnosis not present

## 2018-12-22 ENCOUNTER — Ambulatory Visit (HOSPITAL_COMMUNITY)
Admission: RE | Admit: 2018-12-22 | Discharge: 2018-12-22 | Disposition: A | Discharge status: Home / Self Care | Payer: Medicare HMO | Source: Ambulatory Visit | Attending: Internal Medicine | Admitting: Internal Medicine

## 2018-12-22 ENCOUNTER — Other Ambulatory Visit: Payer: Self-pay

## 2018-12-22 DIAGNOSIS — Z1231 Encounter for screening mammogram for malignant neoplasm of breast: Secondary | ICD-10-CM

## 2019-02-06 DIAGNOSIS — I739 Peripheral vascular disease, unspecified: Secondary | ICD-10-CM | POA: Diagnosis not present

## 2019-02-06 DIAGNOSIS — I10 Essential (primary) hypertension: Secondary | ICD-10-CM | POA: Diagnosis not present

## 2019-02-06 DIAGNOSIS — R32 Unspecified urinary incontinence: Secondary | ICD-10-CM | POA: Diagnosis not present

## 2019-02-06 DIAGNOSIS — I69344 Monoplegia of lower limb following cerebral infarction affecting left non-dominant side: Secondary | ICD-10-CM | POA: Diagnosis not present

## 2019-02-06 DIAGNOSIS — Z85038 Personal history of other malignant neoplasm of large intestine: Secondary | ICD-10-CM | POA: Diagnosis not present

## 2019-02-06 DIAGNOSIS — E669 Obesity, unspecified: Secondary | ICD-10-CM | POA: Diagnosis not present

## 2019-02-06 DIAGNOSIS — Z008 Encounter for other general examination: Secondary | ICD-10-CM | POA: Diagnosis not present

## 2019-02-06 DIAGNOSIS — Z7902 Long term (current) use of antithrombotics/antiplatelets: Secondary | ICD-10-CM | POA: Diagnosis not present

## 2019-02-06 DIAGNOSIS — Z8673 Personal history of transient ischemic attack (TIA), and cerebral infarction without residual deficits: Secondary | ICD-10-CM | POA: Diagnosis not present

## 2019-02-06 DIAGNOSIS — E785 Hyperlipidemia, unspecified: Secondary | ICD-10-CM | POA: Diagnosis not present

## 2019-03-02 DIAGNOSIS — Z6834 Body mass index (BMI) 34.0-34.9, adult: Secondary | ICD-10-CM | POA: Diagnosis not present

## 2019-03-02 DIAGNOSIS — E7849 Other hyperlipidemia: Secondary | ICD-10-CM | POA: Diagnosis not present

## 2019-03-02 DIAGNOSIS — E6609 Other obesity due to excess calories: Secondary | ICD-10-CM | POA: Diagnosis not present

## 2019-03-02 DIAGNOSIS — E039 Hypothyroidism, unspecified: Secondary | ICD-10-CM | POA: Diagnosis not present

## 2019-03-02 DIAGNOSIS — Z1389 Encounter for screening for other disorder: Secondary | ICD-10-CM | POA: Diagnosis not present

## 2019-03-02 DIAGNOSIS — I10 Essential (primary) hypertension: Secondary | ICD-10-CM | POA: Diagnosis not present

## 2019-03-02 DIAGNOSIS — Z0000 Encounter for general adult medical examination without abnormal findings: Secondary | ICD-10-CM | POA: Diagnosis not present

## 2019-04-19 ENCOUNTER — Ambulatory Visit: Payer: Medicare HMO | Attending: Internal Medicine

## 2019-04-19 ENCOUNTER — Other Ambulatory Visit: Payer: Self-pay

## 2019-04-19 DIAGNOSIS — Z23 Encounter for immunization: Secondary | ICD-10-CM

## 2019-04-19 NOTE — Progress Notes (Signed)
   Covid-19 Vaccination Clinic  Name:  ROI CIARCIA    MRN: [id number] DOB: 03-30-1940  04/19/2019  Ms. Lobley was observed post Covid-19 immunization for 30 minutes based on pre-vaccination screening without incidence. She was provided with Vaccine Information Sheet and instruction to access the V-Safe system.   Ms. Lightfoot was instructed to call 911 with any severe reactions post vaccine: Marland Kitchen Difficulty breathing  . Swelling of your face and throat  . A fast heartbeat  . A bad rash all over your body  . Dizziness and weakness    Immunizations Administered    Name Date Dose VIS Date Route   Moderna COVID-19 Vaccine 04/19/2019  3:04 PM 0.5 mL 02/03/2019 Intramuscular   Manufacturer: Moderna   Lot: [id number]   [id number]

## 2019-05-17 ENCOUNTER — Ambulatory Visit: Payer: Medicare HMO | Attending: Internal Medicine

## 2019-05-17 DIAGNOSIS — Z23 Encounter for immunization: Secondary | ICD-10-CM

## 2019-05-17 NOTE — Progress Notes (Signed)
   Covid-19 Vaccination Clinic  Name:  TEKEYA KOEGLER    MRN: [id number] DOB: 1940-03-10  05/17/2019  Ms. Klenke was observed post Covid-19 immunization for 15 minutes without incident. She was provided with Vaccine Information Sheet and instruction to access the V-Safe system.   Ms. Cisco was instructed to call 911 with any severe reactions post vaccine: Marland Kitchen Difficulty breathing  . Swelling of face and throat  . A fast heartbeat  . A bad rash all over body  . Dizziness and weakness   Immunizations Administered    Name Date Dose VIS Date Route   Moderna COVID-19 Vaccine 05/17/2019  1:02 PM 0.5 mL 02/03/2019 Intramuscular   Manufacturer: Moderna   Lot: [id number]   [id number]

## 2019-09-02 DIAGNOSIS — E7849 Other hyperlipidemia: Secondary | ICD-10-CM | POA: Diagnosis not present

## 2019-09-02 DIAGNOSIS — I10 Essential (primary) hypertension: Secondary | ICD-10-CM | POA: Diagnosis not present

## 2019-10-02 DIAGNOSIS — E7849 Other hyperlipidemia: Secondary | ICD-10-CM | POA: Diagnosis not present

## 2019-10-02 DIAGNOSIS — I10 Essential (primary) hypertension: Secondary | ICD-10-CM | POA: Diagnosis not present

## 2019-11-06 DIAGNOSIS — Z0100 Encounter for examination of eyes and vision without abnormal findings: Secondary | ICD-10-CM | POA: Diagnosis not present

## 2019-11-23 ENCOUNTER — Other Ambulatory Visit (HOSPITAL_COMMUNITY): Payer: Self-pay | Admitting: Internal Medicine

## 2019-11-23 DIAGNOSIS — Z1231 Encounter for screening mammogram for malignant neoplasm of breast: Secondary | ICD-10-CM

## 2019-12-03 DIAGNOSIS — I10 Essential (primary) hypertension: Secondary | ICD-10-CM | POA: Diagnosis not present

## 2019-12-03 DIAGNOSIS — E7849 Other hyperlipidemia: Secondary | ICD-10-CM | POA: Diagnosis not present

## 2019-12-08 DIAGNOSIS — R69 Illness, unspecified: Secondary | ICD-10-CM | POA: Diagnosis not present

## 2019-12-10 DIAGNOSIS — R062 Wheezing: Secondary | ICD-10-CM | POA: Diagnosis not present

## 2019-12-10 DIAGNOSIS — R051 Acute cough: Secondary | ICD-10-CM | POA: Diagnosis not present

## 2019-12-10 DIAGNOSIS — R609 Edema, unspecified: Secondary | ICD-10-CM | POA: Diagnosis not present

## 2019-12-10 DIAGNOSIS — Z681 Body mass index (BMI) 19 or less, adult: Secondary | ICD-10-CM | POA: Diagnosis not present

## 2019-12-14 DIAGNOSIS — Z681 Body mass index (BMI) 19 or less, adult: Secondary | ICD-10-CM | POA: Diagnosis not present

## 2019-12-14 DIAGNOSIS — E782 Mixed hyperlipidemia: Secondary | ICD-10-CM | POA: Diagnosis not present

## 2019-12-14 DIAGNOSIS — R062 Wheezing: Secondary | ICD-10-CM | POA: Diagnosis not present

## 2019-12-14 DIAGNOSIS — R051 Acute cough: Secondary | ICD-10-CM | POA: Diagnosis not present

## 2019-12-14 DIAGNOSIS — R609 Edema, unspecified: Secondary | ICD-10-CM | POA: Diagnosis not present

## 2019-12-25 ENCOUNTER — Ambulatory Visit (HOSPITAL_COMMUNITY): Payer: Medicare HMO

## 2020-01-01 ENCOUNTER — Other Ambulatory Visit: Payer: Self-pay

## 2020-01-01 ENCOUNTER — Ambulatory Visit (HOSPITAL_COMMUNITY)
Admission: RE | Admit: 2020-01-01 | Discharge: 2020-01-01 | Disposition: A | Discharge status: Home / Self Care | Payer: Medicare HMO | Source: Ambulatory Visit | Attending: Internal Medicine | Admitting: Internal Medicine

## 2020-01-01 DIAGNOSIS — Z1231 Encounter for screening mammogram for malignant neoplasm of breast: Secondary | ICD-10-CM | POA: Insufficient documentation

## 2020-01-02 DIAGNOSIS — I10 Essential (primary) hypertension: Secondary | ICD-10-CM | POA: Diagnosis not present

## 2020-01-02 DIAGNOSIS — E7849 Other hyperlipidemia: Secondary | ICD-10-CM | POA: Diagnosis not present

## 2020-02-01 ENCOUNTER — Encounter: Payer: Self-pay | Admitting: Internal Medicine

## 2020-02-01 ENCOUNTER — Other Ambulatory Visit: Payer: Self-pay

## 2020-02-01 ENCOUNTER — Ambulatory Visit: Payer: Medicare HMO | Admitting: Internal Medicine

## 2020-02-01 ENCOUNTER — Other Ambulatory Visit: Payer: Self-pay | Admitting: *Deleted

## 2020-02-01 VITALS — BP 152/98 | HR 61 | Ht 63.0 in | Wt 188.0 lb

## 2020-02-01 DIAGNOSIS — I071 Rheumatic tricuspid insufficiency: Secondary | ICD-10-CM

## 2020-02-01 DIAGNOSIS — I509 Heart failure, unspecified: Secondary | ICD-10-CM | POA: Diagnosis not present

## 2020-02-01 MED ORDER — ROSUVASTATIN CALCIUM 10 MG PO TABS: 10.0000 mg | ORAL_TABLET | Freq: Every day | ORAL | 3 refills | Status: DC

## 2020-02-01 NOTE — Progress Notes (Signed)
Cardiology Office Note   Date:  02/01/2020   ID:  Janice Curtis, DOB 06/29/1940, MRN [id number]  PCP:  [facility]  Cardiologist:   Dorris Carnes, MD   Pt referred by [facility] for CHF   No records available at time of visit    History of Present Illness: Janice Curtis is a 79 y.o. female with hx of tricuspid regurg  She was previously seen by [facility] back in 2017   TR was mild to modrate  The pt's biggest complaint today is cramping in her feet and legs   Occurs at night in bed   Wakes her frequently   No discomfort with walking    She says she notes occasional swelling in ankles  Does get some SOB with bending   Occasional indigestion, not associated with activity   Occasional discomfort in mid chest, not associated with activity   Takes ASA           Current Meds  Medication Sig  . acetaminophen (TYLENOL) 500 MG tablet Take 500 mg by mouth every 6 (six) hours as needed. For pain  . amLODipine (NORVASC) 10 MG tablet Take 5 mg by mouth daily.   . Cholecalciferol (VITAMIN D3) 1000 UNITS CAPS Take 1,000 Units by mouth daily.    . clopidogrel (PLAVIX) 75 MG tablet Take 75 mg by mouth daily.  . furosemide (LASIX) 20 MG tablet Take 20 mg by mouth daily.  Marland Kitchen loratadine (CLARITIN) 10 MG tablet Take 10 mg by mouth daily.  . simvastatin (ZOCOR) 20 MG tablet Take 20 mg by mouth at bedtime.    . verapamil (CALAN) 120 MG tablet Take 180 mg by mouth 3 (three) times daily.      Allergies:   Patient has no known allergies.   Past Medical History:  Diagnosis Date  . Anxiety   . Colon cancer ([place])   . Hypercholesterolemia   . Hypertension   . Obesity   . Stroke [facility]) 12/05/2009    Past Surgical History:  Procedure Laterality Date  . COLONOSCOPY  12/27/08   simple adenoma/inflammatory polyp at anastomosis  . COLONOSCOPY  04/23/2011   Procedure: COLONOSCOPY;  Surgeon: Dorothyann Peng, MD;  Location: [facility];  Service: Endoscopy;  Laterality:  N/A;  10:00  . RIGHT COLECTOMY  11/14/2007     Social History:  The patient  reports that she has never smoked. She has never used smokeless tobacco. She reports that she does not drink alcohol and does not use drugs.   Family History:  The patient's family history includes Heart attack in her mother; Stroke in her father.    ROS:  Please see the history of present illness. All other systems are reviewed and  Negative to the above problem except as noted.    PHYSICAL EXAM: VS:  BP (!) 152/98   Pulse 61   Ht 5\' 3"  (1.6 m)   Wt 188 lb (85.3 kg)   SpO2 98%   BMI 33.30 kg/m   GEN:  Obese 79 yo  in no acute distress  HEENT: normal  Neck: no JVD, carotid bruits Cardiac: RRR; II/VI systolic murmr LSB No LE  edema  Respiratory:  clear to auscultation bilaterally, GI: soft, nontender, nondistended, + BS  No hepatomegaly  MS: no deformity Moving all extremities   Skin: warm and dry, no rash Neuro:  Strength and sensation are intact Psych: euthymic mood, full affect   EKG:  EKG  is ordered today.  SR 61 bpm     Lipid Panel    Component Value Date/Time   CHOL  12/06/2009 0400    196        ATP III CLASSIFICATION:  <200     mg/dL   Desirable  200-239  mg/dL   Borderline High  >=240    mg/dL   High          TRIG 92 12/06/2009 0400   HDL 51 12/06/2009 0400   CHOLHDL 3.8 12/06/2009 0400   VLDL 18 12/06/2009 0400   LDLCALC (H) 12/06/2009 0400    127        Total Cholesterol/HDL:CHD Risk Coronary Heart Disease Risk Table                     Men   Women  1/2 Average Risk   3.4   3.3  Average Risk       5.0   4.4  2 X Average Risk   9.6   7.1  3 X Average Risk  23.4   11.0        Use the calculated Patient Ratio above and the CHD Risk Table to determine the patient's CHD Risk.        ATP III CLASSIFICATION (LDL):  <100     mg/dL   Optimal  100-129  mg/dL   Near or Above                    Optimal  130-159  mg/dL   Borderline  160-189  mg/dL   High  >190     mg/dL    Very High      Wt Readings from Last 3 Encounters:  02/01/20 188 lb (85.3 kg)  02/16/16 181 lb (82.1 kg)  12/05/15 188 lb (85.3 kg)      ASSESSMENT AND PLAN:  79 yo referred for CHF   Will review records   On exam, volume does not appear bad    I would recomm echo to reeval LVEF and TR Will review records when arrive.  2  HTN   BP is high today   Will need f/u   I would not change meds for now   3  CP   Pains are atypical  I am not convinced cardiac   Follow  Get echo   4  HL  Lipids as noted above Lipids not optimal   Would switch to Crestor 10 mg   F//U lipids in 8 wks with AST    5  Hx CVA   Keep on plavix   Follow BP     Current medicines are reviewed at length with the patient today.  The patient does not have concerns regarding medicines.  Signed, Dorris Carnes, MD  02/01/2020 10:56 AM    [address], [place], [place]  [id number] Phone: [phone]; Fax: [phone]

## 2020-02-01 NOTE — Patient Instructions (Signed)
Medication Instructions:  Your physician has recommended you make the following change in your medication:  1.) stop simvastatin 2.) start rosuvastatin (Crestor) 10 mg daily  *If you need a refill on your cardiac medications before your next appointment, please call your pharmacy*   Lab Work: In 8 weeks--lipids and ast -can go to [facility] in [place]  If you have labs (blood work) drawn today and your tests are completely normal, you will receive your results only by: Marland Kitchen MyChart Message (if you have MyChart) OR . A paper copy in the mail If you have any lab test that is abnormal or we need to change your treatment, we will call you to review the results.   Testing/Procedures: [place] Your physician has requested that you have an echocardiogram. Echocardiography is a painless test that uses sound waves to create images of your heart. It provides your doctor with information about the size and shape of your heart and how well your heart's chambers and valves are working. This procedure takes approximately one hour. There are no restrictions for this procedure.   Follow-Up: Follow up with your physician will depend on test results.  Other Instructions

## 2020-02-08 ENCOUNTER — Ambulatory Visit (HOSPITAL_COMMUNITY)
Admission: RE | Admit: 2020-02-08 | Discharge: 2020-02-08 | Disposition: A | Discharge status: Home / Self Care | Payer: Medicare HMO | Source: Ambulatory Visit | Attending: Internal Medicine | Admitting: Internal Medicine

## 2020-02-08 ENCOUNTER — Other Ambulatory Visit: Payer: Self-pay

## 2020-02-08 DIAGNOSIS — I071 Rheumatic tricuspid insufficiency: Secondary | ICD-10-CM | POA: Diagnosis not present

## 2020-02-08 DIAGNOSIS — I509 Heart failure, unspecified: Secondary | ICD-10-CM | POA: Diagnosis not present

## 2020-02-08 LAB — ECHOCARDIOGRAM COMPLETE
AR max vel: 1.71 cm2
AV Area VTI: 2.2 cm2
AV Area mean vel: 2.07 cm2
AV Mean grad: 4.8 mmHg
AV Peak grad: 10.7 mmHg
Ao pk vel: 1.63 m/s
Area-P 1/2: 3.6 cm2
S' Lateral: 2.2 cm

## 2020-02-08 NOTE — Progress Notes (Signed)
*  PRELIMINARY RESULTS* Echocardiogram 2D Echocardiogram has been performed.  Janice Curtis 02/08/2020, 12:21 PM

## 2020-02-09 ENCOUNTER — Telehealth: Payer: Self-pay | Admitting: Internal Medicine

## 2020-02-09 NOTE — Telephone Encounter (Signed)
Patient is returning a call to [facility] in [place].

## 2020-02-09 NOTE — Telephone Encounter (Signed)
Patient given echo results. 

## 2020-03-03 DIAGNOSIS — Z1331 Encounter for screening for depression: Secondary | ICD-10-CM | POA: Diagnosis not present

## 2020-03-03 DIAGNOSIS — E7849 Other hyperlipidemia: Secondary | ICD-10-CM | POA: Diagnosis not present

## 2020-03-03 DIAGNOSIS — E782 Mixed hyperlipidemia: Secondary | ICD-10-CM | POA: Diagnosis not present

## 2020-03-03 DIAGNOSIS — Z8673 Personal history of transient ischemic attack (TIA), and cerebral infarction without residual deficits: Secondary | ICD-10-CM | POA: Diagnosis not present

## 2020-03-03 DIAGNOSIS — Z0001 Encounter for general adult medical examination with abnormal findings: Secondary | ICD-10-CM | POA: Diagnosis not present

## 2020-03-03 DIAGNOSIS — Z1389 Encounter for screening for other disorder: Secondary | ICD-10-CM | POA: Diagnosis not present

## 2020-03-03 DIAGNOSIS — M21612 Bunion of left foot: Secondary | ICD-10-CM | POA: Diagnosis not present

## 2020-03-03 DIAGNOSIS — I10 Essential (primary) hypertension: Secondary | ICD-10-CM | POA: Diagnosis not present

## 2020-03-03 DIAGNOSIS — Z6834 Body mass index (BMI) 34.0-34.9, adult: Secondary | ICD-10-CM | POA: Diagnosis not present

## 2020-03-04 DIAGNOSIS — E782 Mixed hyperlipidemia: Secondary | ICD-10-CM | POA: Diagnosis not present

## 2020-03-04 DIAGNOSIS — I10 Essential (primary) hypertension: Secondary | ICD-10-CM | POA: Diagnosis not present

## 2020-04-02 DIAGNOSIS — E7849 Other hyperlipidemia: Secondary | ICD-10-CM | POA: Diagnosis not present

## 2020-04-02 DIAGNOSIS — I10 Essential (primary) hypertension: Secondary | ICD-10-CM | POA: Diagnosis not present

## 2020-06-01 DIAGNOSIS — E7849 Other hyperlipidemia: Secondary | ICD-10-CM | POA: Diagnosis not present

## 2020-06-01 DIAGNOSIS — I10 Essential (primary) hypertension: Secondary | ICD-10-CM | POA: Diagnosis not present

## 2020-07-21 DIAGNOSIS — I10 Essential (primary) hypertension: Secondary | ICD-10-CM | POA: Diagnosis not present

## 2020-07-21 DIAGNOSIS — Z1331 Encounter for screening for depression: Secondary | ICD-10-CM | POA: Diagnosis not present

## 2020-07-21 DIAGNOSIS — E6609 Other obesity due to excess calories: Secondary | ICD-10-CM | POA: Diagnosis not present

## 2020-07-21 DIAGNOSIS — M25522 Pain in left elbow: Secondary | ICD-10-CM | POA: Diagnosis not present

## 2020-07-21 DIAGNOSIS — Z6833 Body mass index (BMI) 33.0-33.9, adult: Secondary | ICD-10-CM | POA: Diagnosis not present

## 2020-08-02 DIAGNOSIS — E782 Mixed hyperlipidemia: Secondary | ICD-10-CM | POA: Diagnosis not present

## 2020-08-02 DIAGNOSIS — I10 Essential (primary) hypertension: Secondary | ICD-10-CM | POA: Diagnosis not present

## 2020-10-19 ENCOUNTER — Observation Stay (HOSPITAL_COMMUNITY)
Admission: EM | Admit: 2020-10-19 | Discharge: 2020-10-21 | Disposition: A | Discharge status: Home / Self Care | Payer: Medicare HMO | Attending: Family Medicine | Admitting: Family Medicine

## 2020-10-19 ENCOUNTER — Other Ambulatory Visit: Payer: Self-pay

## 2020-10-19 ENCOUNTER — Emergency Department (HOSPITAL_COMMUNITY): Payer: Medicare HMO

## 2020-10-19 DIAGNOSIS — I639 Cerebral infarction, unspecified: Secondary | ICD-10-CM | POA: Diagnosis present

## 2020-10-19 DIAGNOSIS — Z79899 Other long term (current) drug therapy: Secondary | ICD-10-CM | POA: Diagnosis not present

## 2020-10-19 DIAGNOSIS — I6601 Occlusion and stenosis of right middle cerebral artery: Secondary | ICD-10-CM | POA: Diagnosis not present

## 2020-10-19 DIAGNOSIS — I700 Atherosclerosis of aorta: Secondary | ICD-10-CM | POA: Insufficient documentation

## 2020-10-19 DIAGNOSIS — G459 Transient cerebral ischemic attack, unspecified: Principal | ICD-10-CM | POA: Insufficient documentation

## 2020-10-19 DIAGNOSIS — I083 Combined rheumatic disorders of mitral, aortic and tricuspid valves: Secondary | ICD-10-CM | POA: Insufficient documentation

## 2020-10-19 DIAGNOSIS — R29818 Other symptoms and signs involving the nervous system: Secondary | ICD-10-CM | POA: Diagnosis not present

## 2020-10-19 DIAGNOSIS — Z7902 Long term (current) use of antithrombotics/antiplatelets: Secondary | ICD-10-CM | POA: Insufficient documentation

## 2020-10-19 DIAGNOSIS — Z85038 Personal history of other malignant neoplasm of large intestine: Secondary | ICD-10-CM | POA: Insufficient documentation

## 2020-10-19 DIAGNOSIS — Z6833 Body mass index (BMI) 33.0-33.9, adult: Secondary | ICD-10-CM | POA: Diagnosis not present

## 2020-10-19 DIAGNOSIS — I10 Essential (primary) hypertension: Secondary | ICD-10-CM | POA: Diagnosis not present

## 2020-10-19 DIAGNOSIS — I679 Cerebrovascular disease, unspecified: Secondary | ICD-10-CM | POA: Diagnosis not present

## 2020-10-19 DIAGNOSIS — M6281 Muscle weakness (generalized): Secondary | ICD-10-CM | POA: Diagnosis not present

## 2020-10-19 DIAGNOSIS — I447 Left bundle-branch block, unspecified: Secondary | ICD-10-CM | POA: Diagnosis not present

## 2020-10-19 DIAGNOSIS — Z8249 Family history of ischemic heart disease and other diseases of the circulatory system: Secondary | ICD-10-CM | POA: Diagnosis not present

## 2020-10-19 DIAGNOSIS — H5340 Unspecified visual field defects: Secondary | ICD-10-CM | POA: Diagnosis not present

## 2020-10-19 DIAGNOSIS — R29898 Other symptoms and signs involving the musculoskeletal system: Secondary | ICD-10-CM | POA: Diagnosis not present

## 2020-10-19 DIAGNOSIS — E669 Obesity, unspecified: Secondary | ICD-10-CM | POA: Insufficient documentation

## 2020-10-19 DIAGNOSIS — I6523 Occlusion and stenosis of bilateral carotid arteries: Secondary | ICD-10-CM | POA: Diagnosis not present

## 2020-10-19 DIAGNOSIS — Y900 Blood alcohol level of less than 20 mg/100 ml: Secondary | ICD-10-CM | POA: Diagnosis not present

## 2020-10-19 DIAGNOSIS — Z20822 Contact with and (suspected) exposure to covid-19: Secondary | ICD-10-CM | POA: Diagnosis not present

## 2020-10-19 DIAGNOSIS — J90 Pleural effusion, not elsewhere classified: Secondary | ICD-10-CM | POA: Diagnosis not present

## 2020-10-19 DIAGNOSIS — I6503 Occlusion and stenosis of bilateral vertebral arteries: Secondary | ICD-10-CM | POA: Diagnosis not present

## 2020-10-19 DIAGNOSIS — R2689 Other abnormalities of gait and mobility: Secondary | ICD-10-CM | POA: Diagnosis not present

## 2020-10-19 DIAGNOSIS — Z7982 Long term (current) use of aspirin: Secondary | ICD-10-CM | POA: Insufficient documentation

## 2020-10-19 DIAGNOSIS — R42 Dizziness and giddiness: Secondary | ICD-10-CM | POA: Diagnosis not present

## 2020-10-19 DIAGNOSIS — H538 Other visual disturbances: Secondary | ICD-10-CM | POA: Diagnosis present

## 2020-10-19 DIAGNOSIS — R69 Illness, unspecified: Secondary | ICD-10-CM | POA: Diagnosis not present

## 2020-10-19 LAB — CBG MONITORING, ED: Glucose-Capillary: 116 mg/dL — ABNORMAL HIGH (ref 70–99)

## 2020-10-19 NOTE — ED Notes (Signed)
Right 20/35 Left 20/30

## 2020-10-19 NOTE — ED Triage Notes (Addendum)
Blurred vision to left eye around 10pm. Said it made her lightheaded. Left side visual field blurry; unable to distinguish what numbers are being held up

## 2020-10-19 NOTE — ED Provider Notes (Signed)
[facility] EMERGENCY DEPARTMENT Provider Note   CSN: [id number] Arrival date & time: 10/19/20  2226     History Chief Complaint  Patient presents with   Blurred Vision    Janice Curtis is a 80 y.o. female.  80 year old female with a history of CVA the presents emerged department today with an acute left visual field deficit starting around 2200.  No other neurologic changes.  No recent illnesses.  She states that her previous CVA was on the left side but did not involve her vision.       Past Medical History:  Diagnosis Date   Anxiety    Colon cancer ([place])    Hypercholesterolemia    Hypertension    Obesity    Stroke ([place]) 12/05/2009    Patient Active Problem List   Diagnosis Date Noted   CVA (cerebral vascular accident) ([place]) 10/20/2020   VITAMIN D DEFICIENCY 12/08/2008   HYPERCHOLESTEROLEMIA 12/08/2008   HYPERTENSION 12/08/2008   History of malignant neoplasm of large intestine 12/08/2008   HYPOKALEMIA, HX OF 12/08/2008   ANEMIA, IRON DEFICIENCY, HX OF 12/08/2008    Past Surgical History:  Procedure Laterality Date   COLONOSCOPY  12/27/08   simple adenoma/inflammatory polyp at anastomosis   COLONOSCOPY  04/23/2011   Procedure: COLONOSCOPY;  Surgeon: Dorothyann Peng, MD;  Location[facility];  Service: Endoscopy;  Laterality: N/A;  10:00   RIGHT COLECTOMY  11/14/2007     OB History   No obstetric history on file.     Family History  Problem Relation Age of Onset   Heart attack Mother    Stroke Father     Social History   Tobacco Use   Smoking status: Never   Smokeless tobacco: Never  Substance Use Topics   Alcohol use: No   Drug use: No    Home Medications Prior to Admission medications   Medication Sig Start Date End Date Taking? Authorizing Provider  acetaminophen (TYLENOL) 500 MG tablet Take 500 mg by mouth every 6 (six) hours as needed. For pain   Yes [provider]  amLODipine (NORVASC) 10 MG tablet Take 5 mg by mouth  daily.  12/29/13  Yes [provider]  Cholecalciferol (VITAMIN D3) 1000 UNITS CAPS Take 1,000 Units by mouth daily.     Yes [provider]  clopidogrel (PLAVIX) 75 MG tablet Take 75 mg by mouth daily.   Yes [provider]  rosuvastatin (CRESTOR) 10 MG tablet Take 1 tablet (10 mg total) by mouth daily. 02/01/20  Yes Fay Records, MD  verapamil (VERELAN PM) 180 MG 24 hr capsule Take 180 mg by mouth in the morning and at bedtime. 10/11/20  Yes [provider]  aspirin EC 81 MG EC tablet Take 1 tablet (81 mg total) by mouth daily. Swallow whole. 10/22/20   Patrecia Pour, MD    Allergies    Patient has no known allergies.  Review of Systems   Review of Systems  All other systems reviewed and are negative.  Physical Exam Updated Vital Signs BP (!) 158/88 (BP Location: Right Arm)   Pulse 96   Temp 97.8 F (36.6 C) (Oral)   Resp 18   Ht '5\' 3"'$  (1.6 m)   Wt 86.5 kg   SpO2 99%   BMI 33.78 kg/m   Physical Exam Vitals and nursing note reviewed.  Constitutional:      Appearance: She is well-developed.  HENT:     Head: Normocephalic and  atraumatic.     Mouth/Throat:     Mouth: Mucous membranes are moist.     Pharynx: Oropharynx is clear.  Eyes:     Pupils: Pupils are equal, round, and reactive to light.  Cardiovascular:     Rate and Rhythm: Normal rate and regular rhythm.  Pulmonary:     Effort: No respiratory distress.     Breath sounds: No stridor.  Abdominal:     General: Abdomen is flat. There is no distension.  Musculoskeletal:     Cervical back: Normal range of motion.  Neurological:     General: No focal deficit present.     Mental Status: She is alert.     Comments: Patient has symmetric strength in upper extremities and lower extremities.  She has symmetric sensation to light touch in upper and lower extremities.  No facial droop.  She states that her vision is slightly improved and she can see some in her left visual field now but is  still very blurry compared to the right.  Extraocular movements are intact.    ED Results / Procedures / Treatments   Labs (all labs ordered are listed, but only abnormal results are displayed) Labs Reviewed  COMPREHENSIVE METABOLIC PANEL - Abnormal; Notable for the following components:      Result Value   Sodium 132 (*)    Glucose, Bld 121 (*)    Calcium 8.8 (*)    AST 52 (*)    ALT 68 (*)    All other components within normal limits  URINALYSIS, ROUTINE W REFLEX MICROSCOPIC - Abnormal; Notable for the following components:   Color, Urine STRAW (*)    Specific Gravity, Urine 1.042 (*)    Leukocytes,Ua TRACE (*)    All other components within normal limits  CBC - Abnormal; Notable for the following components:   Hemoglobin 11.4 (*)    All other components within normal limits  COMPREHENSIVE METABOLIC PANEL - Abnormal; Notable for the following components:   Sodium 134 (*)    Glucose, Bld 108 (*)    Calcium 8.7 (*)    ALT 57 (*)    All other components within normal limits  HEMOGLOBIN A1C - Abnormal; Notable for the following components:   Hgb A1c MFr Bld 6.0 (*)    All other components within normal limits  COMPREHENSIVE METABOLIC PANEL - Abnormal; Notable for the following components:   Sodium 134 (*)    Potassium 3.4 (*)    Calcium 8.0 (*)    ALT 45 (*)    All other components within normal limits  CBC - Abnormal; Notable for the following components:   Hemoglobin 11.5 (*)    All other components within normal limits  I-STAT CHEM 8, ED - Abnormal; Notable for the following components:   Glucose, Bld 122 (*)    All other components within normal limits  CBG MONITORING, ED - Abnormal; Notable for the following components:   Glucose-Capillary 116 (*)    All other components within normal limits  RESP PANEL BY RT-PCR (FLU A&B, COVID) ARPGX2  ETHANOL  PROTIME-INR  APTT  CBC  DIFFERENTIAL  RAPID URINE DRUG SCREEN, HOSP PERFORMED  MAGNESIUM  LIPID PANEL     EKG EKG Interpretation  Date/Time:  Wednesday October 19 2020 23:31:14 EDT Ventricular Rate:  63 PR Interval:    QRS Duration: 108 QT Interval:  480 QTC Calculation: 491 R Axis:   -9 Text Interpretation: Atrial fibrillation Cannot rule out Anterior infarct ,  age undetermined Abnormal ECG Confirmed by Lacretia Leigh ([id number]) on 10/20/2020 1:05:54 PM  Radiology No results found.  Procedures .Critical Care  Date/Time: 10/28/2020 3:33 AM Performed by: Merrily Pew, MD Authorized by: Merrily Pew, MD   Critical care provider statement:    Critical care time (minutes):  45   Critical care was time spent personally by me on the following activities:  Discussions with consultants, evaluation of patient's response to treatment, examination of patient, ordering and performing treatments and interventions, ordering and review of laboratory studies, ordering and review of radiographic studies, pulse oximetry, re-evaluation of patient's condition, obtaining history from patient or surrogate and review of old charts   Medications Ordered in ED Medications  iohexol (OMNIPAQUE) 350 MG/ML injection 100 mL (100 mLs Intravenous Contrast Given 10/20/20 0039)   stroke: mapping our early stages of recovery book ( Does not apply Given 10/20/20 1750)  potassium chloride SA (KLOR-CON) CR tablet 20 mEq (20 mEq Oral Given 10/21/20 0809)    ED Course  I have reviewed the triage vital signs and the nursing notes.  Pertinent labs & imaging results that were available during my care of the patient were reviewed by me and considered in my medical decision making (see chart for details).    MDM Rules/Calculators/A&P                          Evaluated patient immediately after coming to a room. Left hemianopsia starting while watching TV at 2200. Code stroke activated. No other obvious deficits.   Neurology consultation obtained and patient is not a candidate for tPA however they do feel it is consistent  with likely fluctuating symptoms from CVA versus TIA as her symptoms are improving.  Will admit to the hospitalist for further management.   Final Clinical Impression(s) / ED Diagnoses Final diagnoses:  TIA (transient ischemic attack)    Rx / DC Orders ED Discharge Orders          Ordered    aspirin EC 81 MG EC tablet  Daily        10/21/20 1157    Increase activity slowly        10/21/20 1157    Diet - low sodium heart healthy        10/21/20 1157    Discharge instructions       Comments: You were evaluated for vision loss in the left eye that is thought to be due to a transient ischemic attack (TIA). Neurology has recommended that you take aspirin and plavix daily without missing doses to reduce your risk of stroke. Otherwise, you may continue taking medications as you were. If your symptoms return, seek medical attention right away. Otherwise, follow up with your PCP in the next 1-2 weeks and with neurology in 4 weeks.   10/21/20 1157             Bonne Whack, Corene Cornea, MD 10/28/20 [phone]

## 2020-10-20 ENCOUNTER — Observation Stay (HOSPITAL_BASED_OUTPATIENT_CLINIC_OR_DEPARTMENT_OTHER): Payer: Medicare HMO

## 2020-10-20 ENCOUNTER — Emergency Department (HOSPITAL_COMMUNITY): Payer: Medicare HMO

## 2020-10-20 ENCOUNTER — Encounter (HOSPITAL_COMMUNITY): Payer: Self-pay | Admitting: Family Medicine

## 2020-10-20 ENCOUNTER — Observation Stay (HOSPITAL_COMMUNITY): Payer: Medicare HMO

## 2020-10-20 DIAGNOSIS — G459 Transient cerebral ischemic attack, unspecified: Secondary | ICD-10-CM

## 2020-10-20 DIAGNOSIS — I6389 Other cerebral infarction: Secondary | ICD-10-CM

## 2020-10-20 DIAGNOSIS — R42 Dizziness and giddiness: Secondary | ICD-10-CM | POA: Diagnosis not present

## 2020-10-20 DIAGNOSIS — R29818 Other symptoms and signs involving the nervous system: Secondary | ICD-10-CM | POA: Diagnosis not present

## 2020-10-20 DIAGNOSIS — I639 Cerebral infarction, unspecified: Secondary | ICD-10-CM | POA: Diagnosis not present

## 2020-10-20 DIAGNOSIS — H53139 Sudden visual loss, unspecified eye: Secondary | ICD-10-CM | POA: Diagnosis not present

## 2020-10-20 DIAGNOSIS — I6601 Occlusion and stenosis of right middle cerebral artery: Secondary | ICD-10-CM | POA: Diagnosis not present

## 2020-10-20 DIAGNOSIS — I6503 Occlusion and stenosis of bilateral vertebral arteries: Secondary | ICD-10-CM | POA: Diagnosis not present

## 2020-10-20 DIAGNOSIS — H5340 Unspecified visual field defects: Secondary | ICD-10-CM | POA: Diagnosis not present

## 2020-10-20 DIAGNOSIS — I6523 Occlusion and stenosis of bilateral carotid arteries: Secondary | ICD-10-CM | POA: Diagnosis not present

## 2020-10-20 LAB — CBC
HCT: 36.1 % (ref 36.0–46.0)
HCT: 38.9 % (ref 36.0–46.0)
Hemoglobin: 11.4 g/dL — ABNORMAL LOW (ref 12.0–15.0)
Hemoglobin: 12.8 g/dL (ref 12.0–15.0)
MCH: 26.6 pg (ref 26.0–34.0)
MCH: 27.1 pg (ref 26.0–34.0)
MCHC: 31.6 g/dL (ref 30.0–36.0)
MCHC: 32.9 g/dL (ref 30.0–36.0)
MCV: 82.2 fL (ref 80.0–100.0)
MCV: 84.3 fL (ref 80.0–100.0)
Platelets: 152 10*3/uL (ref 150–400)
Platelets: 186 10*3/uL (ref 150–400)
RBC: 4.28 MIL/uL (ref 3.87–5.11)
RBC: 4.73 MIL/uL (ref 3.87–5.11)
RDW: 14.4 % (ref 11.5–15.5)
RDW: 14.5 % (ref 11.5–15.5)
WBC: 5.1 10*3/uL (ref 4.0–10.5)
WBC: 5.5 10*3/uL (ref 4.0–10.5)
nRBC: 0 % (ref 0.0–0.2)
nRBC: 0 % (ref 0.0–0.2)

## 2020-10-20 LAB — ECHOCARDIOGRAM COMPLETE
AV Mean grad: 2.5 mmHg
AV Peak grad: 5.1 mmHg
Ao pk vel: 1.13 m/s
Area-P 1/2: 3.17 cm2
Height: 63 in
S' Lateral: 2.45 cm
Weight: 3008.84 oz

## 2020-10-20 LAB — COMPREHENSIVE METABOLIC PANEL
ALT: 57 U/L — ABNORMAL HIGH (ref 0–44)
ALT: 68 U/L — ABNORMAL HIGH (ref 0–44)
AST: 39 U/L (ref 15–41)
AST: 52 U/L — ABNORMAL HIGH (ref 15–41)
Albumin: 3.6 g/dL (ref 3.5–5.0)
Albumin: 4.1 g/dL (ref 3.5–5.0)
Alkaline Phosphatase: 84 U/L (ref 38–126)
Alkaline Phosphatase: 97 U/L (ref 38–126)
Anion gap: 8 (ref 5–15)
Anion gap: 8 (ref 5–15)
BUN: 11 mg/dL (ref 8–23)
BUN: 9 mg/dL (ref 8–23)
CO2: 23 mmol/L (ref 22–32)
CO2: 25 mmol/L (ref 22–32)
Calcium: 8.7 mg/dL — ABNORMAL LOW (ref 8.9–10.3)
Calcium: 8.8 mg/dL — ABNORMAL LOW (ref 8.9–10.3)
Chloride: 101 mmol/L (ref 98–111)
Chloride: 101 mmol/L (ref 98–111)
Creatinine, Ser: 0.54 mg/dL (ref 0.44–1.00)
Creatinine, Ser: 0.62 mg/dL (ref 0.44–1.00)
GFR, Estimated: >60 mL/min (ref >60)
GFR, Estimated: >60 mL/min (ref >60)
Glucose, Bld: 108 mg/dL — ABNORMAL HIGH (ref 70–99)
Glucose, Bld: 121 mg/dL — ABNORMAL HIGH (ref 70–99)
Potassium: 3.6 mmol/L (ref 3.5–5.1)
Potassium: 3.7 mmol/L (ref 3.5–5.1)
Sodium: 132 mmol/L — ABNORMAL LOW (ref 135–145)
Sodium: 134 mmol/L — ABNORMAL LOW (ref 135–145)
Total Bilirubin: 0.6 mg/dL (ref 0.3–1.2)
Total Bilirubin: 0.7 mg/dL (ref 0.3–1.2)
Total Protein: 6.7 g/dL (ref 6.5–8.1)
Total Protein: 7.9 g/dL (ref 6.5–8.1)

## 2020-10-20 LAB — RESP PANEL BY RT-PCR (FLU A&B, COVID) ARPGX2
Influenza A by PCR: NEGATIVE
Influenza B by PCR: NEGATIVE
SARS Coronavirus 2 by RT PCR: NEGATIVE

## 2020-10-20 LAB — URINALYSIS, ROUTINE W REFLEX MICROSCOPIC
Bacteria, UA: NONE SEEN
Bilirubin Urine: NEGATIVE
Glucose, UA: NEGATIVE mg/dL
Hgb urine dipstick: NEGATIVE
Ketones, ur: NEGATIVE mg/dL
Nitrite: NEGATIVE
Protein, ur: NEGATIVE mg/dL
Specific Gravity, Urine: 1.042 — ABNORMAL HIGH (ref 1.005–1.030)
pH: 6 (ref 5.0–8.0)

## 2020-10-20 LAB — DIFFERENTIAL
Abs Immature Granulocytes: 0.01 10*3/uL (ref 0.00–0.07)
Basophils Absolute: 0 10*3/uL (ref 0.0–0.1)
Basophils Relative: 1 %
Eosinophils Absolute: 0.2 10*3/uL (ref 0.0–0.5)
Eosinophils Relative: 4 %
Immature Granulocytes: 0 %
Lymphocytes Relative: 41 %
Lymphs Abs: 2.3 10*3/uL (ref 0.7–4.0)
Monocytes Absolute: 0.4 10*3/uL (ref 0.1–1.0)
Monocytes Relative: 7 %
Neutro Abs: 2.6 10*3/uL (ref 1.7–7.7)
Neutrophils Relative %: 47 %

## 2020-10-20 LAB — I-STAT CHEM 8, ED
BUN: 9 mg/dL (ref 8–23)
Calcium, Ion: 1.15 mmol/L (ref 1.15–1.40)
Chloride: 101 mmol/L (ref 98–111)
Creatinine, Ser: 0.6 mg/dL (ref 0.44–1.00)
Glucose, Bld: 122 mg/dL — ABNORMAL HIGH (ref 70–99)
HCT: 39 % (ref 36.0–46.0)
Hemoglobin: 13.3 g/dL (ref 12.0–15.0)
Potassium: 3.8 mmol/L (ref 3.5–5.1)
Sodium: 136 mmol/L (ref 135–145)
TCO2: 24 mmol/L (ref 22–32)

## 2020-10-20 LAB — HEMOGLOBIN A1C
Hgb A1c MFr Bld: 6 % — ABNORMAL HIGH (ref 4.8–5.6)
Mean Plasma Glucose: 125.5 mg/dL

## 2020-10-20 LAB — RAPID URINE DRUG SCREEN, HOSP PERFORMED
Amphetamines: NOT DETECTED
Barbiturates: NOT DETECTED
Benzodiazepines: NOT DETECTED
Cocaine: NOT DETECTED
Opiates: NOT DETECTED
Tetrahydrocannabinol: NOT DETECTED

## 2020-10-20 LAB — LIPID PANEL
Cholesterol: 132 mg/dL (ref 0–200)
HDL: 43 mg/dL (ref >40)
LDL Cholesterol: 75 mg/dL (ref 0–99)
Total CHOL/HDL Ratio: 3.1 RATIO
Triglycerides: 69 mg/dL (ref <150)
VLDL: 14 mg/dL (ref 0–40)

## 2020-10-20 LAB — APTT: aPTT: 30 seconds (ref 24–36)

## 2020-10-20 LAB — MAGNESIUM: Magnesium: 1.9 mg/dL (ref 1.7–2.4)

## 2020-10-20 LAB — PROTIME-INR
INR: 1.1 (ref 0.8–1.2)
Prothrombin Time: 13.8 seconds (ref 11.4–15.2)

## 2020-10-20 LAB — ETHANOL: Alcohol, Ethyl (B): <10 mg/dL (ref <10)

## 2020-10-20 MED ORDER — ACETAMINOPHEN 650 MG RE SUPP: 650.0000 mg | RECTAL | Status: DC | PRN

## 2020-10-20 MED ORDER — ACETAMINOPHEN 160 MG/5ML PO SOLN: 650.0000 mg | ORAL | Status: DC | PRN

## 2020-10-20 MED ORDER — STROKE: EARLY STAGES OF RECOVERY BOOK: Freq: Once | Status: AC

## 2020-10-20 MED ORDER — SODIUM CHLORIDE 0.9 % IV SOLN: INTRAVENOUS | Status: DC

## 2020-10-20 MED ORDER — LORATADINE 10 MG PO TABS
10.0000 mg | ORAL_TABLET | Freq: Every day | ORAL | Status: DC
  Administered 2020-10-20 – 2020-10-21 (×2): 10 mg via ORAL
  Filled 2020-10-20 (×2): qty 1

## 2020-10-20 MED ORDER — ACETAMINOPHEN 500 MG PO TABS: 500.0000 mg | ORAL_TABLET | Freq: Four times a day (QID) | ORAL | Status: DC | PRN

## 2020-10-20 MED ORDER — ACETAMINOPHEN 325 MG PO TABS: 650.0000 mg | ORAL_TABLET | ORAL | Status: DC | PRN

## 2020-10-20 MED ORDER — ASPIRIN EC 81 MG PO TBEC
81.0000 mg | DELAYED_RELEASE_TABLET | Freq: Every day | ORAL | Status: DC
  Administered 2020-10-20 – 2020-10-21 (×2): 81 mg via ORAL
  Filled 2020-10-20 (×2): qty 1

## 2020-10-20 MED ORDER — VITAMIN D 25 MCG (1000 UNIT) PO TABS
1000.0000 [IU] | ORAL_TABLET | Freq: Every day | ORAL | Status: DC
  Administered 2020-10-20 – 2020-10-21 (×2): 1000 [IU] via ORAL
  Filled 2020-10-20 (×2): qty 1

## 2020-10-20 MED ORDER — ROSUVASTATIN CALCIUM 10 MG PO TABS
10.0000 mg | ORAL_TABLET | Freq: Every day | ORAL | Status: DC
  Administered 2020-10-20 – 2020-10-21 (×2): 10 mg via ORAL
  Filled 2020-10-20 (×2): qty 1

## 2020-10-20 MED ORDER — HEPARIN SODIUM (PORCINE) 5000 UNIT/ML IJ SOLN
5000.0000 [IU] | Freq: Three times a day (TID) | INTRAMUSCULAR | Status: DC
  Administered 2020-10-20 – 2020-10-21 (×2): 5000 [IU] via SUBCUTANEOUS
  Filled 2020-10-20 (×2): qty 1

## 2020-10-20 MED ORDER — IOHEXOL 350 MG/ML SOLN
100.0000 mL | Freq: Once | INTRAVENOUS | Status: AC | PRN
  Administered 2020-10-20: 100 mL via INTRAVENOUS

## 2020-10-20 MED ORDER — CLOPIDOGREL BISULFATE 75 MG PO TABS
75.0000 mg | ORAL_TABLET | Freq: Every day | ORAL | Status: DC
  Administered 2020-10-20 – 2020-10-21 (×2): 75 mg via ORAL
  Filled 2020-10-20 (×2): qty 1

## 2020-10-20 NOTE — ED Notes (Signed)
Pt ambulate to bathroom 

## 2020-10-20 NOTE — ED Notes (Signed)
Pt ambulated to the bathroom.  

## 2020-10-20 NOTE — Progress Notes (Signed)
Code stroke  Call time [id number] Beeper  None Start  2357 End  0001 Soc  0001 Epic  0001  Rad  0001

## 2020-10-20 NOTE — Consult Note (Signed)
[place] A. Merlene Laughter, MD     [email or web address]          Janice Curtis is an 80 y.o. female.   ASSESSMENT/PLAN: TRANSIENT ISCHEMIC ATTACK LIKELY INVOLVING THE RIGHT MCA DISTRIBUTION: This is likely due to intracranial atherosclerotic occlusive disease. Dual antiplatelet agents are recommended for 3 months. Additionally use of statin medication and optimization of dyslipidemia is recommended. Also blood pressure control is recommended.  The patient presents with the acute onset of loss of vision involving the left visual field. She reports the episode lasting for about 30 minutes. It was associated with dizziness, gait instability and lightheadedness. She thinks that she may have had some dysarthria as reported by her family members. She does not report palpitation, chest pain, shortness of breath. She does not report having headaches, focal numbness or weakness. She is back to normal. She is on Plavix after she had a stroke about 10 years ago. She also takes aspirin but once or twice a week for pain. The review systems otherwise negative.    GENERAL: She is doing well at this time.  HEENT: Neck is supple no trauma noted.  ABDOMEN: Soft  EXTREMITIES: No edema   BACK: Normal alignment.  SKIN: Normal by inspection.    MENTAL STATUS: Alert and oriented. Speech, language and cognition are generally intact. Judgment and insight normal.   CRANIAL NERVES: Pupils are equal, round and reactive to light and accommodation; extraocular movements are full, there is no significant nystagmus; upper and lower facial muscles are normal in strength and symmetric, there is no flattening of the nasolabial folds; tongue is midline; uvula is midline; shoulder elevation is normal. Visual fields are intact at this time.  MOTOR: Normal tone, bulk and strength; no pronator drift. There is no drift of the upper lower extremities.  COORDINATION: Left finger to nose is normal, right  finger to nose is normal, No rest tremor; no intention tremor; no postural tremor; no bradykinesia.  REFLEXES: Deep tendon reflexes are symmetrical and normal but brisk in the legs.   SENSATION: Normal to light touch and temperature. She does not extinguish to double simultaneous stimulation.    Blood pressure (!) 157/85, pulse (!) 54, temperature 97.6 F (36.4 C), resp. rate 20, height '5\' 3"'$  (1.6 m), weight 85.3 kg, SpO2 96 %.  Past Medical History:  Diagnosis Date   Anxiety    Colon cancer ([place])    Hypercholesterolemia    Hypertension    Obesity    Stroke ([place]) 12/05/2009    Past Surgical History:  Procedure Laterality Date   COLONOSCOPY  12/27/08   simple adenoma/inflammatory polyp at anastomosis   COLONOSCOPY  04/23/2011   Procedure: COLONOSCOPY;  Surgeon: Dorothyann Peng, MD;  Location: [facility];  Service: Endoscopy;  Laterality: N/A;  10:00   RIGHT COLECTOMY  11/14/2007    Family History  Problem Relation Age of Onset   Heart attack Mother    Stroke Father     Social History:  reports that she has never smoked. She has never used smokeless tobacco. She reports that she does not drink alcohol and does not use drugs.  Allergies: No Known Allergies  Medications: Prior to Admission medications   Medication Sig Start Date End Date Taking? Authorizing Provider  acetaminophen (TYLENOL) 500 MG tablet Take 500 mg by mouth every 6 (six) hours as needed. For pain   Yes [provider]  amLODipine (NORVASC) 10 MG tablet Take 5 mg by mouth daily.  12/29/13  Yes [provider]  Cholecalciferol (VITAMIN D3) 1000 UNITS CAPS Take 1,000 Units by mouth daily.     Yes [provider]  clopidogrel (PLAVIX) 75 MG tablet Take 75 mg by mouth daily.   Yes [provider]  rosuvastatin (CRESTOR) 10 MG tablet Take 1 tablet (10 mg total) by mouth daily. 02/01/20  Yes Fay Records, MD  verapamil (VERELAN PM) 180 MG 24 hr capsule Take 180 mg by mouth in  the morning and at bedtime. 10/11/20  Yes [provider]    Scheduled Meds:  aspirin EC  81 mg Oral Daily   clopidogrel  75 mg Oral Daily   rosuvastatin  10 mg Oral Daily   Continuous Infusions: PRN Meds:.     Results for orders placed or performed during the hospital encounter of 10/19/20 (from the past 48 hour(s))  Resp Panel by RT-PCR (Flu A&B, Covid) Nasopharyngeal Swab     Status: None   Collection Time: 10/19/20 11:48 PM   Specimen: Nasopharyngeal Swab; Nasopharyngeal(NP) swabs in vial transport medium  Result Value Ref Range   SARS Coronavirus 2 by RT PCR NEGATIVE NEGATIVE    Comment: (NOTE) SARS-CoV-2 target nucleic acids are NOT DETECTED.  The SARS-CoV-2 RNA is generally detectable in upper respiratory specimens during the acute phase of infection. The lowest concentration of SARS-CoV-2 viral copies this assay can detect is 138 copies/mL. A negative result does not preclude SARS-Cov-2 infection and should not be used as the sole basis for treatment or other patient management decisions. A negative result may occur with  improper specimen collection/handling, submission of specimen other than nasopharyngeal swab, presence of viral mutation(s) within the areas targeted by this assay, and inadequate number of viral copies(<138 copies/mL). A negative result must be combined with clinical observations, patient history, and epidemiological information. The expected result is Negative.  Fact Sheet for Patients:  EntrepreneurPulse.com.au  Fact Sheet for Healthcare Providers:  IncredibleEmployment.be  This test is no t yet approved or cleared by the [place] FDA and  has been authorized for detection and/or diagnosis of SARS-CoV-2 by FDA under an Emergency Use Authorization (EUA). This EUA will remain  in effect (meaning this test can be used) for the duration of the COVID-19 declaration under Section 564(b)(1) of the Act,  21 U.S.C.section 360bbb-3(b)(1), unless the authorization is terminated  or revoked sooner.       Influenza A by PCR NEGATIVE NEGATIVE   Influenza B by PCR NEGATIVE NEGATIVE    Comment: (NOTE) The Xpert Xpress SARS-CoV-2/FLU/RSV plus assay is intended as an aid in the diagnosis of influenza from Nasopharyngeal swab specimens and should not be used as a sole basis for treatment. Nasal washings and aspirates are unacceptable for Xpert Xpress SARS-CoV-2/FLU/RSV testing.  Fact Sheet for Patients: EntrepreneurPulse.com.au  Fact Sheet for Healthcare Providers: IncredibleEmployment.be  This test is not yet approved or cleared by the [place] FDA and has been authorized for detection and/or diagnosis of SARS-CoV-2 by FDA under an Emergency Use Authorization (EUA). This EUA will remain in effect (meaning this test can be used) for the duration of the COVID-19 declaration under Section 564(b)(1) of the Act, 21 U.S.C. section 360bbb-3(b)(1), unless the authorization is terminated or revoked.  Performed at [facility], [address], [place], [place] [address]   Ethanol     Status: None   Collection Time: 10/19/20 11:48 PM  Result Value Ref Range   Alcohol, Ethyl (B) <10 <10 mg/dL  Comment: (NOTE) Lowest detectable limit for serum alcohol is 10 mg/dL.  For medical purposes only. Performed at [facility], [address], [place], [place] [address]   Protime-INR     Status: None   Collection Time: 10/19/20 11:48 PM  Result Value Ref Range   Prothrombin Time 13.8 11.4 - 15.2 seconds   INR 1.1 0.8 - 1.2    Comment: (NOTE) INR goal varies based on device and disease states. Performed at [facility], [address], [place], [place] [address]   APTT     Status: None   Collection Time: 10/19/20 11:48 PM  Result Value Ref Range   aPTT 30 24 - 36 seconds    Comment: Performed at [facility], [address], [place], [place] [address]  CBC      Status: None   Collection Time: 10/19/20 11:48 PM  Result Value Ref Range   WBC 5.5 4.0 - 10.5 K/uL   RBC 4.73 3.87 - 5.11 MIL/uL   Hemoglobin 12.8 12.0 - 15.0 g/dL   HCT 38.9 36.0 - 46.0 %   MCV 82.2 80.0 - 100.0 fL   MCH 27.1 26.0 - 34.0 pg   MCHC 32.9 30.0 - 36.0 g/dL   RDW 14.4 11.5 - 15.5 %   Platelets 186 150 - 400 K/uL   nRBC 0.0 0.0 - 0.2 %    Comment: Performed at [facility], [address] [place], [place] [address]  Differential     Status: None   Collection Time: 10/19/20 11:48 PM  Result Value Ref Range   Neutrophils Relative % 47 %   Neutro Abs 2.6 1.7 - 7.7 K/uL   Lymphocytes Relative 41 %   Lymphs Abs 2.3 0.7 - 4.0 K/uL   Monocytes Relative 7 %   Monocytes Absolute 0.4 0.1 - 1.0 K/uL   Eosinophils Relative 4 %   Eosinophils Absolute 0.2 0.0 - 0.5 K/uL   Basophils Relative 1 %   Basophils Absolute 0.0 0.0 - 0.1 K/uL   Immature Granulocytes 0 %   Abs Immature Granulocytes 0.01 0.00 - 0.07 K/uL    Comment: Performed at [facility], [address], [place], [place] [address]  Comprehensive metabolic panel     Status: Abnormal   Collection Time: 10/19/20 11:48 PM  Result Value Ref Range   Sodium 132 (L) 135 - 145 mmol/L   Potassium 3.6 3.5 - 5.1 mmol/L   Chloride 101 98 - 111 mmol/L   CO2 23 22 - 32 mmol/L   Glucose, Bld 121 (H) 70 - 99 mg/dL    Comment: Glucose reference range applies only to samples taken after fasting for at least 8 hours.   BUN 11 8 - 23 mg/dL   Creatinine, Ser 0.62 0.44 - 1.00 mg/dL   Calcium 8.8 (L) 8.9 - 10.3 mg/dL   Total Protein 7.9 6.5 - 8.1 g/dL   Albumin 4.1 3.5 - 5.0 g/dL   AST 52 (H) 15 - 41 U/L   ALT 68 (H) 0 - 44 U/L   Alkaline Phosphatase 97 38 - 126 U/L   Total Bilirubin 0.7 0.3 - 1.2 mg/dL   GFR, Estimated >60 >60 mL/min    Comment: (NOTE) Calculated using the CKD-EPI Creatinine Equation (2021)    Anion gap 8 5 - 15    Comment: Performed at [facility], [address] [place],  [address]  CBG monitoring,  ED     Status: Abnormal   Collection Time: 10/19/20 11:54 PM  Result Value Ref Range   Glucose-Capillary 116 (H) 70 - 99 mg/dL    Comment: Glucose reference range applies only to samples taken after fasting for at least 8 hours.  I-stat chem 8, ED     Status: Abnormal   Collection Time: 10/19/20 11:57 PM  Result Value Ref Range   Sodium 136 135 - 145 mmol/L   Potassium 3.8 3.5 - 5.1 mmol/L   Chloride 101 98 - 111 mmol/L   BUN 9 8 - 23 mg/dL   Creatinine, Ser 0.60 0.44 - 1.00 mg/dL   Glucose, Bld 122 (H) 70 - 99 mg/dL    Comment: Glucose reference range applies only to samples taken after fasting for at least 8 hours.   Calcium, Ion 1.15 1.15 - 1.40 mmol/L   TCO2 24 22 - 32 mmol/L   Hemoglobin 13.3 12.0 - 15.0 g/dL   HCT 39.0 36.0 - 46.0 %  Urine rapid drug screen (hosp performed)     Status: None   Collection Time: 10/20/20  3:16 AM  Result Value Ref Range   Opiates NONE DETECTED NONE DETECTED   Cocaine NONE DETECTED NONE DETECTED   Benzodiazepines NONE DETECTED NONE DETECTED   Amphetamines NONE DETECTED NONE DETECTED   Tetrahydrocannabinol NONE DETECTED NONE DETECTED   Barbiturates NONE DETECTED NONE DETECTED    Comment: (NOTE) DRUG SCREEN FOR MEDICAL PURPOSES ONLY.  IF CONFIRMATION IS NEEDED FOR ANY PURPOSE, NOTIFY LAB WITHIN 5 DAYS.  LOWEST DETECTABLE LIMITS FOR URINE DRUG SCREEN Drug Class                     Cutoff (ng/mL) Amphetamine and metabolites    1000 Barbiturate and metabolites    200 Benzodiazepine                 [id number] Tricyclics and metabolites     300 Opiates and metabolites        300 Cocaine and metabolites        300 THC                            50 Performed at [facility], [address], [place], [place] [address]   Urinalysis, Routine w reflex microscopic Urine, Clean Catch     Status: Abnormal   Collection Time: 10/20/20  3:16 AM  Result Value Ref Range   Color, Urine STRAW (A) YELLOW   APPearance CLEAR CLEAR   Specific Gravity, Urine 1.042  (H) 1.005 - 1.030   pH 6.0 5.0 - 8.0   Glucose, UA NEGATIVE NEGATIVE mg/dL   Hgb urine dipstick NEGATIVE NEGATIVE   Bilirubin Urine NEGATIVE NEGATIVE   Ketones, ur NEGATIVE NEGATIVE mg/dL   Protein, ur NEGATIVE NEGATIVE mg/dL   Nitrite NEGATIVE NEGATIVE   Leukocytes,Ua TRACE (A) NEGATIVE   WBC, UA 0-5 0 - 5 WBC/hpf   Bacteria, UA NONE SEEN NONE SEEN   Squamous Epithelial / LPF 6-10 0 - 5   Mucus PRESENT     Comment: Performed at [facility], [address], [place], [place] [address]  CBC     Status: Abnormal   Collection Time: 10/20/20  4:28 AM  Result Value Ref Range   WBC 5.1 4.0 - 10.5 K/uL   RBC 4.28 3.87 - 5.11 MIL/uL   Hemoglobin 11.4 (L) 12.0 - 15.0 g/dL   HCT 36.1 36.0 - 46.0 %   MCV 84.3 80.0 - 100.0 fL   MCH  26.6 26.0 - 34.0 pg   MCHC 31.6 30.0 - 36.0 g/dL   RDW 14.5 11.5 - 15.5 %   Platelets 152 150 - 400 K/uL   nRBC 0.0 0.0 - 0.2 %    Comment: Performed at [facility], [address] [place], [place] [address]  Comprehensive metabolic panel     Status: Abnormal   Collection Time: 10/20/20  4:28 AM  Result Value Ref Range   Sodium 134 (L) 135 - 145 mmol/L   Potassium 3.7 3.5 - 5.1 mmol/L   Chloride 101 98 - 111 mmol/L   CO2 25 22 - 32 mmol/L   Glucose, Bld 108 (H) 70 - 99 mg/dL    Comment: Glucose reference range applies only to samples taken after fasting for at least 8 hours.   BUN 9 8 - 23 mg/dL   Creatinine, Ser 0.54 0.44 - 1.00 mg/dL   Calcium 8.7 (L) 8.9 - 10.3 mg/dL   Total Protein 6.7 6.5 - 8.1 g/dL   Albumin 3.6 3.5 - 5.0 g/dL   AST 39 15 - 41 U/L   ALT 57 (H) 0 - 44 U/L   Alkaline Phosphatase 84 38 - 126 U/L   Total Bilirubin 0.6 0.3 - 1.2 mg/dL   GFR, Estimated >60 >60 mL/min    Comment: (NOTE) Calculated using the CKD-EPI Creatinine Equation (2021)    Anion gap 8 5 - 15    Comment: Performed at [facility], [address], [place], [place] [address]  Magnesium     Status: None   Collection Time: 10/20/20  4:28 AM  Result Value Ref Range    Magnesium 1.9 1.7 - 2.4 mg/dL    Comment: Performed at [facility], [address] [place], [place] [address]    Studies/Results:   HEAD NECK CTA CTA HEAD FINDINGS   POSTERIOR CIRCULATION:   --Vertebral arteries: Left is occluded with minimal distal reconstitution by collateral flow. Right-sided is normal.   --Inferior cerebellar arteries: Normal.   --Basilar artery: Normal.   --Superior cerebellar arteries: Normal.   --Posterior cerebral arteries (PCA): Normal.   ANTERIOR CIRCULATION:   --Intracranial internal carotid arteries: Normal.   --Anterior cerebral arteries (ACA): Normal. Both A1 segments are present.   --Middle cerebral arteries (MCA): Multifocal moderate-to-severe stenosis of the right M1 segment, which remains patent. Mild atherosclerotic irregularity of distal left M1 segment.   VENOUS SINUSES: As permitted by contrast timing, patent.   ANATOMIC VARIANTS: None   Review of the MIP images confirms the above findings.   CT Brain Perfusion Findings:   ASPECTS: 10   CBF (<30%) Volume: 4m   Perfusion (Tmax>6.0s) volume: 0160m  Mismatch Volume: 60m27m Infarction Location:None   IMPRESSION: 1. No emergent large vessel occlusion. 2. Occlusion of the left vertebral artery at the V2 V3 junction. 3. Multifocal moderate-to-severe stenosis of the right MCA M1 segment. Mild atherosclerotic irregularity of the left M1 segment. 4. Bilateral proximal internal carotid artery stenosis, measuring 70% on the right and 60% on the left. 5. Small right pleural effusion.    HEAD MRI MRA FINDINGS: Brain: There is no evidence of acute intracranial hemorrhage, extra-axial fluid collection, or infarct.   There are small remote infarcts in the right basal ganglia/corona radiata and left cerebellar hemisphere. Scattered foci of FLAIR signal abnormality throughout the subcortical and periventricular white matter nonspecific but likely reflects sequela of  chronic white matter microangiopathy. There is mild global parenchymal volume loss with commensurate enlargement of the  ventricular system. There is no midline shift. No mass lesion is seen. There is a single punctate chronic microhemorrhage in the right centrum semiovale, nonspecific.   Vascular: Normal flow voids. The vasculature is better assessed on the same-day CT a head/neck.   Skull and upper cervical spine: Normal marrow signal.   Sinuses/Orbits: The paranasal sinuses are clear. The globes and orbits are unremarkable.   Other: None.   IMPRESSION: 1. No acute intracranial pathology. 2. Small remote infarcts in the right basal ganglia and left cerebellar hemisphere. 3. Mild parenchymal volume loss and chronic white matter microangiopathy.     Brain imaging is reviewed. MRI shows acute infarcts or mood changes. There is suggestion of a small microhemorrhage involving right frontal lobe. There is remote infarct as evidenced by small encephalomalacia involving the right caudate nuclei and the right posterior limb the external capsule extending to the centrum semiovale. There is also small left pontine remote infarct. There is mild to moderate chronic white ischemic changes.   Zain Lankford A. Merlene Laughter, M.D.  Diplomate, Tax adviser of Psychiatry and Neurology ( Neurology). 10/20/2020, 3:41 PM

## 2020-10-20 NOTE — Progress Notes (Signed)
*  PRELIMINARY RESULTS* Echocardiogram 2D Echocardiogram has been performed.  Janice Curtis 10/20/2020, 11:59 AM

## 2020-10-20 NOTE — H&P (Signed)
TRH H&P    Patient Demographics:    Janice Curtis, is a 80 y.o. female  MRN: [id number]  DOB - 05-19-1940  Admit Date - 10/19/2020  Referring MD/NP/PA: [place]  Outpatient Primary MD for the patient is [place]  Patient coming from: home  Chief complaint- Vision loss   HPI:    Janice Curtis  is a 80 y.o. female, with history of anxiety, colon cancer, hyperlipidemia, hypertension, and stroke, presents the ED with a chief complaint of vision loss.  Of note patient's previous stroke was 10 years ago.  She reports minimal left-sided weakness as her residual deficit.  Today she was watching TV when she had sudden vision loss in her left eye.  She got up and went to the kitchen and noticed that she could not see the first half of phone numbers and other items on her fridge.  She felt dizzy, but she had no headache.  She had no difficulty with speech or swallowing.  She had no facial droop, no weakness in 1 side more than the other.  She does report that prior to this incident she felt like her left neck pulling sensation.  She attributed it to a strained muscle, and did not think more of it.  That pulling sensation is no longer there.  Patient does take an aspirin most days, reports that she does miss some doses.  She takes her statin regularly.  Patient reports that she came straight to the ER because she was concerned about stroke with the symptoms.  Code stroke was called in the ER.  Telemetry neuro made recommendations to bring patient in for neurochecks, bedside swallow eval, DVT prophylaxis, IV fluids, head of bed at 30%, euglycemia and avoiding hyperthermia, 81 mg aspirin, 75 mg Plavix, and permissive hypertension.  Patient reports that she does not smoke, does not use alcohol, does not use illicit drugs.  She is vaccinated for COVID.  Patient was admitted to the ED  In the ED Temp 97.6, heart  rate 57-83, respiratory 17-22, blood pressure 163/77, satting 96% White blood cell count 5.5, hemoglobin 12.8 Chemistry panel is unremarkable Negative COVID Negative UDS Negative alcohol level AST 52, ALT 68 EKG shows  QTC of 491, rate of 63-EKG reads A. fib with there are some discernible P waves CT angiogram head neck shows no emergent large vessel occlusion, occlusion left vertebral artery V2 to V3, moderate to severe stenosis, right MCA M1, carotid artery stenosis CT head shows no acute intracranial abnormality Telemetry neuro recommendations as above Admission was requested for further stroke work-up    Review of systems:    In addition to the HPI above,  No Fever-chills, No Headache, admits to changes in vision No problems swallowing food or Liquids, No Chest pain, Cough or Shortness of Breath, No Abdominal pain, No Nausea or Vomiting, bowel movements are regular, No Blood in stool or Urine, No dysuria, No new skin rashes or bruises, No new joints pains-aches,  No new weakness, tingling, numbness in any extremity, No recent weight  gain or loss, No polyuria, polydypsia or polyphagia, No significant Mental Stressors.  All other systems reviewed and are negative.    Past History of the following :    Past Medical History:  Diagnosis Date   Anxiety    Colon cancer ([place])    Hypercholesterolemia    Hypertension    Obesity    Stroke ([place]) 12/05/2009      Past Surgical History:  Procedure Laterality Date   COLONOSCOPY  12/27/08   simple adenoma/inflammatory polyp at anastomosis   COLONOSCOPY  04/23/2011   Procedure: COLONOSCOPY;  Surgeon: Dorothyann Peng, MD;  Location: [facility];  Service: Endoscopy;  Laterality: N/A;  10:00   RIGHT COLECTOMY  11/14/2007      Social History:      Social History   Tobacco Use   Smoking status: Never   Smokeless tobacco: Never  Substance Use Topics   Alcohol use: No       Family History :     Family History   Problem Relation Age of Onset   Heart attack Mother    Stroke Father       Home Medications:   Prior to Admission medications   Medication Sig Start Date End Date Taking? Authorizing Provider  acetaminophen (TYLENOL) 500 MG tablet Take 500 mg by mouth every 6 (six) hours as needed. For pain    [provider]  amLODipine (NORVASC) 10 MG tablet Take 5 mg by mouth daily.  12/29/13   [provider]  Cholecalciferol (VITAMIN D3) 1000 UNITS CAPS Take 1,000 Units by mouth daily.      [provider]  clopidogrel (PLAVIX) 75 MG tablet Take 75 mg by mouth daily.    [provider]  furosemide (LASIX) 20 MG tablet Take 20 mg by mouth daily. 12/31/19   [provider]  loratadine (CLARITIN) 10 MG tablet Take 10 mg by mouth daily.    [provider]  rosuvastatin (CRESTOR) 10 MG tablet Take 1 tablet (10 mg total) by mouth daily. 02/01/20   Fay Records, MD  verapamil (CALAN) 120 MG tablet Take 180 mg by mouth 3 (three) times daily.     [provider]     Allergies:    No Known Allergies   Physical Exam:   Vitals  Blood pressure (!) 154/60, pulse 60, temperature 97.6 F (36.4 C), resp. rate 20, height '5\' 3"'$  (1.6 m), weight 85.3 kg, SpO2 95 %.  1.  General: Patient lying supine in bed,  no acute distress   2. Psychiatric: Alert and oriented x 3, mood and behavior normal for situation, pleasant and cooperative with exam   3. Neurologic: Speech and language are normal, face is symmetric, visual fields are intact, moves all 4 extremities voluntarily finger-to-nose intact,   4. HEENMT:  Head is atraumatic, normocephalic, pupils reactive to light, neck is supple, trachea is midline, mucous membranes are moist   5. Respiratory : Lungs are clear to auscultation bilaterally without wheezing, rhonchi, rales, no cyanosis, no increase in work of breathing or accessory muscle use   6. Cardiovascular : Heart rate normal, rhythm  is regular, no murmurs, rubs or gallops, no peripheral edema, peripheral pulses palpated   7. Gastrointestinal:  Abdomen is soft, nondistended, nontender to palpation bowel sounds active, no masses or organomegaly palpated   8. Skin:  Skin is warm, dry and intact without rashes, acute lesions, or ulcers on limited exam   9.Musculoskeletal:  No acute deformities  or trauma, no asymmetry in tone, no peripheral edema, peripheral pulses palpated, no tenderness to palpation in the extremities     Data Review:    CBC Recent Labs  Lab 10/19/20 2348 10/19/20 2357 10/20/20 0428  WBC 5.5  --  5.1  HGB 12.8 13.3 11.4*  HCT 38.9 39.0 36.1  PLT 186  --  152  MCV 82.2  --  84.3  MCH 27.1  --  26.6  MCHC 32.9  --  31.6  RDW 14.4  --  14.5  LYMPHSABS 2.3  --   --   MONOABS 0.4  --   --   EOSABS 0.2  --   --   BASOSABS 0.0  --   --    ------------------------------------------------------------------------------------------------------------------  Results for orders placed or performed during the hospital encounter of 10/19/20 (from the past 48 hour(s))  Resp Panel by RT-PCR (Flu A&B, Covid) Nasopharyngeal Swab     Status: None   Collection Time: 10/19/20 11:48 PM   Specimen: Nasopharyngeal Swab; Nasopharyngeal(NP) swabs in vial transport medium  Result Value Ref Range   SARS Coronavirus 2 by RT PCR NEGATIVE NEGATIVE    Comment: (NOTE) SARS-CoV-2 target nucleic acids are NOT DETECTED.  The SARS-CoV-2 RNA is generally detectable in upper respiratory specimens during the acute phase of infection. The lowest concentration of SARS-CoV-2 viral copies this assay can detect is 138 copies/mL. A negative result does not preclude SARS-Cov-2 infection and should not be used as the sole basis for treatment or other patient management decisions. A negative result may occur with  improper specimen collection/handling, submission of specimen other than nasopharyngeal swab, presence of viral  mutation(s) within the areas targeted by this assay, and inadequate number of viral copies(<138 copies/mL). A negative result must be combined with clinical observations, patient history, and epidemiological information. The expected result is Negative.  Fact Sheet for Patients:  EntrepreneurPulse.com.au  Fact Sheet for Healthcare Providers:  IncredibleEmployment.be  This test is no t yet approved or cleared by the [place] FDA and  has been authorized for detection and/or diagnosis of SARS-CoV-2 by FDA under an Emergency Use Authorization (EUA). This EUA will remain  in effect (meaning this test can be used) for the duration of the COVID-19 declaration under Section 564(b)(1) of the Act, 21 U.S.C.section 360bbb-3(b)(1), unless the authorization is terminated  or revoked sooner.       Influenza A by PCR NEGATIVE NEGATIVE   Influenza B by PCR NEGATIVE NEGATIVE    Comment: (NOTE) The Xpert Xpress SARS-CoV-2/FLU/RSV plus assay is intended as an aid in the diagnosis of influenza from Nasopharyngeal swab specimens and should not be used as a sole basis for treatment. Nasal washings and aspirates are unacceptable for Xpert Xpress SARS-CoV-2/FLU/RSV testing.  Fact Sheet for Patients: EntrepreneurPulse.com.au  Fact Sheet for Healthcare Providers: IncredibleEmployment.be  This test is not yet approved or cleared by the [place] FDA and has been authorized for detection and/or diagnosis of SARS-CoV-2 by FDA under an Emergency Use Authorization (EUA). This EUA will remain in effect (meaning this test can be used) for the duration of the COVID-19 declaration under Section 564(b)(1) of the Act, 21 U.S.C. section 360bbb-3(b)(1), unless the authorization is terminated or revoked.  Performed at [facility], [address] [place], [place] [address]   Ethanol     Status: None   Collection Time: 10/19/20  11:48 PM  Result Value Ref Range   Alcohol, Ethyl (B) <10 <10 mg/dL    Comment: (  NOTE) Lowest detectable limit for serum alcohol is 10 mg/dL.  For medical purposes only. Performed at [facility], [address] [place], [place] [address]   Protime-INR     Status: None   Collection Time: 10/19/20 11:48 PM  Result Value Ref Range   Prothrombin Time 13.8 11.4 - 15.2 seconds   INR 1.1 0.8 - 1.2    Comment: (NOTE) INR goal varies based on device and disease states. Performed at [facility], [address], [place], [place] [address]   APTT     Status: None   Collection Time: 10/19/20 11:48 PM  Result Value Ref Range   aPTT 30 24 - 36 seconds    Comment: Performed at [facility], [address] [place], [place] [address]  CBC     Status: None   Collection Time: 10/19/20 11:48 PM  Result Value Ref Range   WBC 5.5 4.0 - 10.5 K/uL   RBC 4.73 3.87 - 5.11 MIL/uL   Hemoglobin 12.8 12.0 - 15.0 g/dL   HCT 38.9 36.0 - 46.0 %   MCV 82.2 80.0 - 100.0 fL   MCH 27.1 26.0 - 34.0 pg   MCHC 32.9 30.0 - 36.0 g/dL   RDW 14.4 11.5 - 15.5 %   Platelets 186 150 - 400 K/uL   nRBC 0.0 0.0 - 0.2 %    Comment: Performed at [facility], [address] [place], [place] [address]  Differential     Status: None   Collection Time: 10/19/20 11:48 PM  Result Value Ref Range   Neutrophils Relative % 47 %   Neutro Abs 2.6 1.7 - 7.7 K/uL   Lymphocytes Relative 41 %   Lymphs Abs 2.3 0.7 - 4.0 K/uL   Monocytes Relative 7 %   Monocytes Absolute 0.4 0.1 - 1.0 K/uL   Eosinophils Relative 4 %   Eosinophils Absolute 0.2 0.0 - 0.5 K/uL   Basophils Relative 1 %   Basophils Absolute 0.0 0.0 - 0.1 K/uL   Immature Granulocytes 0 %   Abs Immature Granulocytes 0.01 0.00 - 0.07 K/uL    Comment: Performed at [facility], [address], [place], [place] [address]  Comprehensive metabolic panel     Status: Abnormal   Collection Time: 10/19/20 11:48 PM  Result Value Ref Range   Sodium 132 (L) 135 - 145 mmol/L    Potassium 3.6 3.5 - 5.1 mmol/L   Chloride 101 98 - 111 mmol/L   CO2 23 22 - 32 mmol/L   Glucose, Bld 121 (H) 70 - 99 mg/dL    Comment: Glucose reference range applies only to samples taken after fasting for at least 8 hours.   BUN 11 8 - 23 mg/dL   Creatinine, Ser 0.62 0.44 - 1.00 mg/dL   Calcium 8.8 (L) 8.9 - 10.3 mg/dL   Total Protein 7.9 6.5 - 8.1 g/dL   Albumin 4.1 3.5 - 5.0 g/dL   AST 52 (H) 15 - 41 U/L   ALT 68 (H) 0 - 44 U/L   Alkaline Phosphatase 97 38 - 126 U/L   Total Bilirubin 0.7 0.3 - 1.2 mg/dL   GFR, Estimated >60 >60 mL/min    Comment: (NOTE) Calculated using the CKD-EPI Creatinine Equation (2021)    Anion gap 8 5 - 15    Comment: Performed at [facility], [address], [place],  [address]  CBG monitoring, ED     Status: Abnormal   Collection Time: 10/19/20 11:54 PM  Result  Value Ref Range   Glucose-Capillary 116 (H) 70 - 99 mg/dL    Comment: Glucose reference range applies only to samples taken after fasting for at least 8 hours.  I-stat chem 8, ED     Status: Abnormal   Collection Time: 10/19/20 11:57 PM  Result Value Ref Range   Sodium 136 135 - 145 mmol/L   Potassium 3.8 3.5 - 5.1 mmol/L   Chloride 101 98 - 111 mmol/L   BUN 9 8 - 23 mg/dL   Creatinine, Ser 0.60 0.44 - 1.00 mg/dL   Glucose, Bld 122 (H) 70 - 99 mg/dL    Comment: Glucose reference range applies only to samples taken after fasting for at least 8 hours.   Calcium, Ion 1.15 1.15 - 1.40 mmol/L   TCO2 24 22 - 32 mmol/L   Hemoglobin 13.3 12.0 - 15.0 g/dL   HCT 39.0 36.0 - 46.0 %  Urine rapid drug screen (hosp performed)     Status: None   Collection Time: 10/20/20  3:16 AM  Result Value Ref Range   Opiates NONE DETECTED NONE DETECTED   Cocaine NONE DETECTED NONE DETECTED   Benzodiazepines NONE DETECTED NONE DETECTED   Amphetamines NONE DETECTED NONE DETECTED   Tetrahydrocannabinol NONE DETECTED NONE DETECTED   Barbiturates NONE DETECTED NONE DETECTED    Comment: (NOTE) DRUG SCREEN  FOR MEDICAL PURPOSES ONLY.  IF CONFIRMATION IS NEEDED FOR ANY PURPOSE, NOTIFY LAB WITHIN 5 DAYS.  LOWEST DETECTABLE LIMITS FOR URINE DRUG SCREEN Drug Class                     Cutoff (ng/mL) Amphetamine and metabolites    1000 Barbiturate and metabolites    200 Benzodiazepine                 [id number] Tricyclics and metabolites     300 Opiates and metabolites        300 Cocaine and metabolites        300 THC                            50 Performed at [facility], [address] [place], [place] [address]   Urinalysis, Routine w reflex microscopic Urine, Clean Catch     Status: Abnormal   Collection Time: 10/20/20  3:16 AM  Result Value Ref Range   Color, Urine STRAW (A) YELLOW   APPearance CLEAR CLEAR   Specific Gravity, Urine 1.042 (H) 1.005 - 1.030   pH 6.0 5.0 - 8.0   Glucose, UA NEGATIVE NEGATIVE mg/dL   Hgb urine dipstick NEGATIVE NEGATIVE   Bilirubin Urine NEGATIVE NEGATIVE   Ketones, ur NEGATIVE NEGATIVE mg/dL   Protein, ur NEGATIVE NEGATIVE mg/dL   Nitrite NEGATIVE NEGATIVE   Leukocytes,Ua TRACE (A) NEGATIVE   WBC, UA 0-5 0 - 5 WBC/hpf   Bacteria, UA NONE SEEN NONE SEEN   Squamous Epithelial / LPF 6-10 0 - 5   Mucus PRESENT     Comment: Performed at [facility], [address] [place], [place] [address]  CBC     Status: Abnormal   Collection Time: 10/20/20  4:28 AM  Result Value Ref Range   WBC 5.1 4.0 - 10.5 K/uL   RBC 4.28 3.87 - 5.11 MIL/uL   Hemoglobin 11.4 (L) 12.0 - 15.0 g/dL   HCT 36.1 36.0 - 46.0 %   MCV 84.3 80.0 - 100.0 fL   MCH 26.6  26.0 - 34.0 pg   MCHC 31.6 30.0 - 36.0 g/dL   RDW 14.5 11.5 - 15.5 %   Platelets 152 150 - 400 K/uL   nRBC 0.0 0.0 - 0.2 %    Comment: Performed at [facility], [address] [place], [place] [address]  Comprehensive metabolic panel     Status: Abnormal   Collection Time: 10/20/20  4:28 AM  Result Value Ref Range   Sodium 134 (L) 135 - 145 mmol/L   Potassium 3.7 3.5 - 5.1 mmol/L   Chloride 101 98 - 111 mmol/L   CO2  25 22 - 32 mmol/L   Glucose, Bld 108 (H) 70 - 99 mg/dL    Comment: Glucose reference range applies only to samples taken after fasting for at least 8 hours.   BUN 9 8 - 23 mg/dL   Creatinine, Ser 0.54 0.44 - 1.00 mg/dL   Calcium 8.7 (L) 8.9 - 10.3 mg/dL   Total Protein 6.7 6.5 - 8.1 g/dL   Albumin 3.6 3.5 - 5.0 g/dL   AST 39 15 - 41 U/L   ALT 57 (H) 0 - 44 U/L   Alkaline Phosphatase 84 38 - 126 U/L   Total Bilirubin 0.6 0.3 - 1.2 mg/dL   GFR, Estimated >60 >60 mL/min    Comment: (NOTE) Calculated using the CKD-EPI Creatinine Equation (2021)    Anion gap 8 5 - 15    Comment: Performed at [facility], [address] [place], [place] [address]  Magnesium     Status: None   Collection Time: 10/20/20  4:28 AM  Result Value Ref Range   Magnesium 1.9 1.7 - 2.4 mg/dL    Comment: Performed at [facility], [address] [place], [place] [address]    Chemistries  Recent Labs  Lab 10/19/20 2348 10/19/20 2357 10/20/20 0428  NA 132* 136 134*  K 3.6 3.8 3.7  CL 101 101 101  CO2 23  --  25  GLUCOSE 121* 122* 108*  BUN '11 9 9  '$ CREATININE 0.62 0.60 0.54  CALCIUM 8.8*  --  8.7*  MG  --   --  1.9  AST 52*  --  39  ALT 68*  --  57*  ALKPHOS 97  --  84  BILITOT 0.7  --  0.6   ------------------------------------------------------------------------------------------------------------------  ------------------------------------------------------------------------------------------------------------------ GFR: Estimated Creatinine Clearance: 59.1 mL/min (by C-G formula based on SCr of 0.54 mg/dL). Liver Function Tests: Recent Labs  Lab 10/19/20 2348 10/20/20 0428  AST 52* 39  ALT 68* 57*  ALKPHOS 97 84  BILITOT 0.7 0.6  PROT 7.9 6.7  ALBUMIN 4.1 3.6   No results for input(s): LIPASE, AMYLASE in the last 168 hours. No results for input(s): AMMONIA in the last 168 hours. Coagulation Profile: Recent Labs  Lab 10/19/20 2348  INR 1.1   Cardiac Enzymes: No results for  input(s): CKTOTAL, CKMB, CKMBINDEX, TROPONINI in the last 168 hours. BNP (last 3 results) No results for input(s): PROBNP in the last 8760 hours. HbA1C: No results for input(s): HGBA1C in the last 72 hours. CBG: Recent Labs  Lab 10/19/20 2354  GLUCAP 116*   Lipid Profile: No results for input(s): CHOL, HDL, LDLCALC, TRIG, CHOLHDL, LDLDIRECT in the last 72 hours. Thyroid Function Tests: No results for input(s): TSH, T4TOTAL, FREET4, T3FREE, THYROIDAB in the last 72 hours. Anemia Panel: No results for input(s): VITAMINB12, FOLATE, FERRITIN, TIBC, IRON, RETICCTPCT in the last 72 hours.  --------------------------------------------------------------------------------------------------------------- Urine analysis:  Component Value Date/Time   COLORURINE STRAW (A) 10/20/2020 0316   APPEARANCEUR CLEAR 10/20/2020 0316   LABSPEC 1.042 (H) 10/20/2020 0316   PHURINE 6.0 10/20/2020 0316   GLUCOSEU NEGATIVE 10/20/2020 0316   HGBUR NEGATIVE 10/20/2020 0316   BILIRUBINUR NEGATIVE 10/20/2020 0316   KETONESUR NEGATIVE 10/20/2020 0316   PROTEINUR NEGATIVE 10/20/2020 0316   UROBILINOGEN 0.2 11/11/2011 1026   NITRITE NEGATIVE 10/20/2020 0316   LEUKOCYTESUR TRACE (A) 10/20/2020 0316      Imaging Results:    CT HEAD CODE STROKE WO CONTRAST  Result Date: 10/20/2020 CLINICAL DATA:  Code stroke.  Visual field defect EXAM: CT HEAD WITHOUT CONTRAST TECHNIQUE: Contiguous axial images were obtained from the base of the skull through the vertex without intravenous contrast. COMPARISON:  None. FINDINGS: Brain: There is no mass, hemorrhage or extra-axial collection. The size and configuration of the ventricles and extra-axial CSF spaces are normal. Old small vessel infarct of the right caudate head. Vascular: No abnormal hyperdensity of the major intracranial arteries or dural venous sinuses. No intracranial atherosclerosis. Skull: The visualized skull base, calvarium and extracranial soft tissues are  normal. Sinuses/Orbits: No fluid levels or advanced mucosal thickening of the visualized paranasal sinuses. No mastoid or middle ear effusion. The orbits are normal. ASPECTS [facility] Stroke Program Early CT Score) - Ganglionic level infarction (caudate, lentiform nuclei, internal capsule, insula, M1-M3 cortex): 7 - Supraganglionic infarction (M4-M6 cortex): 3 Total score (0-10 with 10 being normal): 10 IMPRESSION: 1. No acute intracranial abnormality. 2. Old small vessel infarct of the right caudate head. 3. ASPECTS is 10. These results were called by telephone at the time of interpretation on 10/20/2020 at 12:07 am to provider [facility] , who verbally acknowledged these results. Electronically Signed   By: Ulyses Jarred M.D.   On: 10/20/2020 00:07   CT ANGIO HEAD NECK W WO CM W PERF (CODE STROKE)  Result Date: 10/20/2020 CLINICAL DATA:  Left eye blurry vision EXAM: CT ANGIOGRAPHY HEAD AND NECK CT PERFUSION BRAIN TECHNIQUE: Multidetector CT imaging of the head and neck was performed using the standard protocol during bolus administration of intravenous contrast. Multiplanar CT image reconstructions and MIPs were obtained to evaluate the vascular anatomy. Carotid stenosis measurements (when applicable) are obtained utilizing NASCET criteria, using the distal internal carotid diameter as the denominator. Multiphase CT imaging of the brain was performed following IV bolus contrast injection. Subsequent parametric perfusion maps were calculated using RAPID software. CONTRAST:  176m OMNIPAQUE IOHEXOL 350 MG/ML SOLN COMPARISON:  None. FINDINGS: CTA NECK FINDINGS SKELETON: There is no bony spinal canal stenosis. No lytic or blastic lesion. OTHER NECK: Normal pharynx, larynx and major salivary glands. No cervical lymphadenopathy. Unremarkable thyroid gland. UPPER CHEST: Small right pleural effusion AORTIC ARCH: There is calcific atherosclerosis of the aortic arch. There is no aneurysm, dissection or hemodynamically  significant stenosis of the visualized portion of the aorta. Conventional 3 vessel aortic branching pattern. The visualized proximal subclavian arteries are widely patent. RIGHT CAROTID SYSTEM: No dissection, occlusion or aneurysm. There is predominantly calcified atherosclerosis extending into the proximal ICA, resulting in 70% stenosis. LEFT CAROTID SYSTEM: No dissection, occlusion or aneurysm. There is calcified atherosclerosis extending into the proximal ICA, resulting in 60% stenosis. VERTEBRAL ARTERIES: Right dominant configuration. Both origins are clearly patent. The left vertebral artery becomes occluded at the V2 V3 junction. The right vertebral artery is normal along its cervical course. CTA HEAD FINDINGS POSTERIOR CIRCULATION: --Vertebral arteries: Left is occluded with minimal distal reconstitution by collateral flow.  Right-sided is normal. --Inferior cerebellar arteries: Normal. --Basilar artery: Normal. --Superior cerebellar arteries: Normal. --Posterior cerebral arteries (PCA): Normal. ANTERIOR CIRCULATION: --Intracranial internal carotid arteries: Normal. --Anterior cerebral arteries (ACA): Normal. Both A1 segments are present. --Middle cerebral arteries (MCA): Multifocal moderate-to-severe stenosis of the right M1 segment, which remains patent. Mild atherosclerotic irregularity of distal left M1 segment. VENOUS SINUSES: As permitted by contrast timing, patent. ANATOMIC VARIANTS: None Review of the MIP images confirms the above findings. CT Brain Perfusion Findings: ASPECTS: 10 CBF (<30%) Volume: 376m Perfusion (Tmax>6.0s) volume: 092mMismatch Volume: 76m53mnfarction Location:None IMPRESSION: 1. No emergent large vessel occlusion. 2. Occlusion of the left vertebral artery at the V2 V3 junction. 3. Multifocal moderate-to-severe stenosis of the right MCA M1 segment. Mild atherosclerotic irregularity of the left M1 segment. 4. Bilateral proximal internal carotid artery stenosis, measuring 70% on the  right and 60% on the left. 5. Small right pleural effusion. Aortic atherosclerosis (ICD10-I70.0). Electronically Signed   By: KevUlyses JarredD.   On: 10/20/2020 01:09       Assessment & Plan:    Active Problems:   CVA (cerebral vascular accident) ([place] CVA/TIA Start aspirin and Plavix MRI of the brain in the a.m. Speech, physical therapy, Occupational Therapy eval and treat Neuro consult Permissive hypertension with a goal blood pressure 220/120 Monitor on telemetry Continue to monitor Hypertension Holding antihypertensives for permissive hypertension Hyperlipidemia Continue statin medication    DVT Prophylaxis-   Heparin- SCDs   AM Labs Ordered, also please review Full Orders  Family Communication: No family at bedside.  Code Status: DNR  Admission status: Observation Time spent in minutes : 65 [place]

## 2020-10-20 NOTE — Progress Notes (Signed)
PROGRESS NOTE  Brief Narrative: Janice Curtis is a 80 y.o. 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 In the ED she was evaluated with CT of the head which was nonacute, teleneurology evaluated the patient, though her symptoms seem to have resolved. Due to concern for right PCA TIA or lacunar infarct, admission for stroke work up and formal neurology evaluation was recommended. MRI has resulted showing no acute stroke. Echocardiogram is pending.   Subjective: Symptoms resolved. Getting around at her baseline, no speech issues or new weakness or numbness.   Objective: BP (!) 157/85   Pulse (!) 54   Temp 97.6 F (36.4 C)   Resp 20   Ht '5\' 3"'$  (1.6 m)   Wt 85.3 kg   SpO2 96%   BMI 33.31 kg/m   Gen: Well-appearing 80yo F Pulm: Clear and nonlabored on room air  CV: RRR, no murmur, no JVD, no edema GI: Soft, NT, ND, +BS  Neuro: Alert and oriented. No focal deficits. Pt reports feeling some chronic weakness in left arm and leg though exam shows significant strength bilaterally. Visual fields full to confrontation at this time.  Skin: No rashes, lesions or ulcers  Assessment & Plan: 80yo F with history of stroke presenting with transient painless left eye visual field cut that has slowly resolved. Negative neuroimaging thus far. Addition of aspirin to chronic plavix was recommended by teleneurology, also continuing high intensity statin. Lipid panel and A1c pending as is interpretation of echocardiogram. We will await these results and formal evaluation by neurology for final recommendations.   Patrecia Pour, MD Pager on amion 10/20/2020, 2:52 PM

## 2020-10-20 NOTE — ED Notes (Signed)
Patient transported to MRI 

## 2020-10-20 NOTE — ED Notes (Signed)
Dietary contacted for tray.

## 2020-10-20 NOTE — ED Notes (Signed)
Pt talking with neurologist at this time.

## 2020-10-20 NOTE — Progress Notes (Signed)
[facility] Tele[facility]urology Consult [facility]   Date of Service:   10/19/2020 23:59:51  Diagnosis:       I63.00 - Cerebrovascular accident (CVA) due to thrombosis of precerebral artery (HCCC)       G45.9 - Transient cerebral ischemic attack, unspecified  Impression:      Patient with history of HTN, HLD, remote colon cancer, CVA who presents after an episode of left visual field cut and dizziness. The symptoms appear to have resolved. Presentation is concerning for a right PCA territory TIA or lacunar infarct with fluctuating symptoms. No IV thrombolytic at this time given NIHSS 0 and complete resolution. Admit for stroke workup, MRI brain imaging and initiate DAPT.    Attempted to reach ER MD several times, no answer.  Metrics: Last Known Well: 10/19/2020 22:00:00 [facility] Notification Time: 10/19/2020 23:59:51 Arrival Time: 10/19/2020 22:26:00 Stamp Time: 10/19/2020 23:59:51 Initial Response Time: 10/20/2020 00:06:27 Symptoms: blurry vision left eye and dizziness, lightheadedness. NIHSS Start Assessment Time: 10/20/2020 00:08:22 Patient is not a candidate for Thrombolytic. Thrombolytic Medical Decision: 10/20/2020 00:16:44 Patient was not deemed candidate for Thrombolytic because of following reasons: Resolved symptoms (no residual disabling symptoms). No disabling symptoms.  CT head showed no acute hemorrhage or acute core infarct. CT head was reviewed and results were: I personally Reviewed the CT Head and it Showed no bleed chronic right caudate infarct  ED Physician notified of diagnostic impression and management plan on 10/20/2020 00:23:59  Advanced Imaging: CTA Head and Neck ordered  CTP ordered   Our recommendations are outlined below.  Recommendations:        Stroke/Telemetry Floor       Neuro Checks       Bedside Swallow Eval       DVT Prophylaxis       IV Fluids, Normal Saline       Head of Bed 30 Degrees       Euglycemia and  Avoid Hyperthermia (PRN Acetaminophen)       Initiate dual antiplatelet therapy with Aspirin 81 mg daily and Clopidogrel 75 mg daily.       Antihypertensives PRN if Blood pressure is greater than 220/120 or there is a concern for End organ damage/contraindications for permissive HTN. If blood pressure is greater than 220/120 give labetalol PO or IV or Vasotec IV with a goal of 15% reduction in BP during the first 24 hours.  Routine Consultation with [facility] for Follow up Care    ------------------------------------------------------------------------------  History of Present Illness: Patient is a 80 year old Female.  Patient was brought by private transportation with symptoms of blurry vision left eye and dizziness, lightheadedness.  Patient is a 80 yr old woman with PMH of HTN, HLD, colon ca, previous CVA no residual symptoms who presents iwth left visual field cut loss and dizziness. At 2200 the patient was watching TV and should could not see part of the screen on the left side. Then she went to the kitchen and she could not see part of the stove. Patient denies double vision. She states dizziness and vision loss have completely resolved. NO weakness, no numbness, no other reported deficits.  Last seen normal was within 4.5 hours. There is no history of hemorrhagic complications or intracranial hemorrhage. There is no history of Recent Anticoagulants. There is no history of recent major surgery. There is no history of recent stroke.  Past Medical History:      Hypertension      Hyperlipidemia  Stroke      There is NO history of Diabetes Mellitus      There is NO history of Atrial Fibrillation      There is NO history of Coronary Artery Disease      colon cancer, 14 yrs ago  Social History: Smoking: No Alcohol Use: No Drug Use: No  Anticoagulant use:  No  Antiplatelet use: Yes plavix  Allergies:  Reviewed    Examination: BP(192/113), Pulse(74), Blood  Glucose(116) 1A: Level of Consciousness - Alert; keenly responsive + 0 1B: Ask Month and Age - Both Questions Right + 0 1C: Blink Eyes & Squeeze Hands - Performs Both Tasks + 0 2: Test Horizontal Extraocular Movements - Normal + 0 3: Test Visual Fields - No Visual Loss + 0 4: Test Facial Palsy (Use Grimace if Obtunded) - Normal symmetry + 0 5A: Test Left Arm Motor Drift - No Drift for 10 Seconds + 0 5B: Test Right Arm Motor Drift - No Drift for 10 Seconds + 0 6A: Test Left Leg Motor Drift - No Drift for 5 Seconds + 0 6B: Test Right Leg Motor Drift - No Drift for 5 Seconds + 0 7: Test Limb Ataxia (FNF/Heel-Shin) - No Ataxia + 0 8: Test Sensation - Normal; No sensory loss + 0 9: Test Language/Aphasia - Normal; No aphasia + 0 10: Test Dysarthria - Normal + 0 11: Test Extinction/Inattention - No abnormality + 0  NIHSS Score: 0   Pre-Morbid Modified Rankin Scale: 0 Points = No symptoms at all   Patient/Family was informed the Neurology Consult would occur via TeleHealth consult by way of interactive audio and video telecommunications and consented to receiving care in this manner.   Patient is being evaluated for possible acute neurologic impairment and high probability of imminent or life-threatening deterioration. I spent total of 30 minutes providing care to this patient, including time for face to face visit via telemedicine, review of medical records, imaging studies and discussion of findings with providers, the patient and/or family.   Dr Jacqulynn Cadet Mikeal Winstanley   [facility] [phone]  Case [id number]

## 2020-10-20 NOTE — ED Notes (Signed)
Ambulated pt to bathroom. Hooked back up to bedside monitor.

## 2020-10-21 DIAGNOSIS — G459 Transient cerebral ischemic attack, unspecified: Secondary | ICD-10-CM | POA: Diagnosis not present

## 2020-10-21 LAB — CBC
HCT: 36.2 % (ref 36.0–46.0)
Hemoglobin: 11.5 g/dL — ABNORMAL LOW (ref 12.0–15.0)
MCH: 26.3 pg (ref 26.0–34.0)
MCHC: 31.8 g/dL (ref 30.0–36.0)
MCV: 82.8 fL (ref 80.0–100.0)
Platelets: 172 10*3/uL (ref 150–400)
RBC: 4.37 MIL/uL (ref 3.87–5.11)
RDW: 14.6 % (ref 11.5–15.5)
WBC: 4.3 10*3/uL (ref 4.0–10.5)
nRBC: 0 % (ref 0.0–0.2)

## 2020-10-21 LAB — COMPREHENSIVE METABOLIC PANEL
ALT: 45 U/L — ABNORMAL HIGH (ref 0–44)
AST: 31 U/L (ref 15–41)
Albumin: 3.5 g/dL (ref 3.5–5.0)
Alkaline Phosphatase: 80 U/L (ref 38–126)
Anion gap: 7 (ref 5–15)
BUN: 8 mg/dL (ref 8–23)
CO2: 25 mmol/L (ref 22–32)
Calcium: 8 mg/dL — ABNORMAL LOW (ref 8.9–10.3)
Chloride: 102 mmol/L (ref 98–111)
Creatinine, Ser: 0.54 mg/dL (ref 0.44–1.00)
GFR, Estimated: >60 mL/min (ref >60)
Glucose, Bld: 92 mg/dL (ref 70–99)
Potassium: 3.4 mmol/L — ABNORMAL LOW (ref 3.5–5.1)
Sodium: 134 mmol/L — ABNORMAL LOW (ref 135–145)
Total Bilirubin: 1.1 mg/dL (ref 0.3–1.2)
Total Protein: 6.6 g/dL (ref 6.5–8.1)

## 2020-10-21 MED ORDER — ONDANSETRON 4 MG PO TBDP
4.0000 mg | ORAL_TABLET | Freq: Three times a day (TID) | ORAL | Status: DC | PRN
  Administered 2020-10-21: 4 mg via ORAL
  Filled 2020-10-21: qty 1

## 2020-10-21 MED ORDER — POTASSIUM CHLORIDE CRYS ER 20 MEQ PO TBCR
20.0000 meq | EXTENDED_RELEASE_TABLET | Freq: Once | ORAL | Status: AC
  Administered 2020-10-21: 20 meq via ORAL
  Filled 2020-10-21: qty 1

## 2020-10-21 MED ORDER — ASPIRIN 81 MG PO TBEC: 81.0000 mg | DELAYED_RELEASE_TABLET | Freq: Every day | ORAL | 11 refills | Status: DC

## 2020-10-21 NOTE — Care Management Obs Status (Signed)
[facility] NOTIFICATION   Patient Details  Name: Janice Curtis MRN: [id number] Date of Birth: [birth year]   Medicare Observation Status Notification Given:  Yes    Tommy Medal 10/21/2020, 10:43 AM

## 2020-10-21 NOTE — Plan of Care (Signed)
  Problem: Education: Goal: Knowledge of secondary prevention will improve Outcome: Adequate for Discharge

## 2020-10-21 NOTE — Evaluation (Signed)
Physical Therapy Evaluation Patient Details Name: Janice Curtis MRN: [id number] DOB: Jun 08, 1940 Today's Date: 10/21/2020   History of Present Illness  Janice Curtis  is a 80 y.o. female, with history of anxiety, colon cancer, hyperlipidemia, hypertension, and stroke, presents the ED with a chief complaint of vision loss.  Of note patient's previous stroke was 10 years ago.  She reports minimal left-sided weakness as her residual deficit.  Today she was watching TV when she had sudden vision loss in her left eye.  She got up and went to the kitchen and noticed that she could not see the first half of phone numbers and other items on her fridge.  She felt dizzy, but she had no headache. MRI negative for acute stroke. Pt reports symptoms have resolved.   Clinical Impression  Patient functioning at baseline for functional mobility and gait. Patient seated in chair at beginning of session. She is able to transfer to standing and ambulate without AD. Patient also able to ambulate while navigating IV pole. Patient returned to room at end of session and is set up for breakfast. Patient discharged to care of nursing for ambulation daily as tolerated for length of stay.     Follow Up Recommendations No PT follow up    Equipment Recommendations  None recommended by PT    Recommendations for Other Services       Precautions / Restrictions Precautions Precautions: Fall Restrictions Weight Bearing Restrictions: No      Mobility  Bed Mobility               General bed mobility comments: Patient steated in chair at beginning of session    Transfers Overall transfer level: Modified independent Equipment used: None             General transfer comment: transfer from chair without AD, minimal unsteadiness upon standing  Ambulation/Gait Ambulation/Gait assistance: Modified independent (Device/Increase time) Gait Distance (Feet): 80 Feet Assistive device: IV Pole;None Gait  Pattern/deviations: Step-through pattern     General Gait Details: Patient ambulates initially without AD, able to ambulate managing IV pole  Stairs            Wheelchair Mobility    Modified Rankin (Stroke Patients Only)       Balance Overall balance assessment: No apparent balance deficits (not formally assessed)                                           Pertinent Vitals/Pain Pain Assessment: No/denies pain    Home Living Family/patient expects to be discharged to:: Private residence Living Arrangements: Other (Comment) (grandson lives with pt (14 yrs)) Available Help at Discharge: Family;Available PRN/intermittently Type of Home: House Home Access: Level entry     Home Layout: One level Home Equipment: Grab bars - tub/shower      Prior Function Level of Independence: Independent         Comments: independent in mobility and ADLs, drives     Hand Dominance   Dominant Hand: Right    Extremity/Trunk Assessment   Upper Extremity Assessment Upper Extremity Assessment: Defer to OT evaluation    Lower Extremity Assessment Lower Extremity Assessment: Overall WFL for tasks assessed    Cervical / Trunk Assessment Cervical / Trunk Assessment: Normal  Communication   Communication: No difficulties  Cognition Arousal/Alertness: Awake/alert Behavior During Therapy: WFL for tasks assessed/performed Overall Cognitive  Status: Within Functional Limits for tasks assessed                                        General Comments      Exercises     Assessment/Plan    PT Assessment Patent does not need any further PT services  PT Problem List         PT Treatment Interventions      PT Goals (Current goals can be found in the Care Plan section)  Acute Rehab PT Goals Patient Stated Goal: return home PT Goal Formulation: With patient Time For Goal Achievement: 10/21/20 Potential to Achieve Goals: Good     Frequency     Barriers to discharge        Co-evaluation               AM-PAC PT "6 Clicks" Mobility  Outcome Measure Help needed turning from your back to your side while in a flat bed without using bedrails?: None Help needed moving from lying on your back to sitting on the side of a flat bed without using bedrails?: None Help needed moving to and from a bed to a chair (including a wheelchair)?: None Help needed standing up from a chair using your arms (e.g., wheelchair or bedside chair)?: None Help needed to walk in hospital room?: None Help needed climbing 3-5 steps with a railing? : None 6 Click Score: 24    End of Session   Activity Tolerance: Patient tolerated treatment well Patient left: in chair;with call bell/phone within reach Nurse Communication: Mobility status PT Visit Diagnosis: Other abnormalities of gait and mobility (R26.89);Other symptoms and signs involving the nervous system (R29.898)    [id number] PT Time Calculation (min) (ACUTE ONLY): 6 min   Charges:   PT Evaluation $PT Eval Low Complexity: 1 Low         9:08 AM, 10/21/20 Mearl Latin PT, DPT Physical Therapist at [facility]

## 2020-10-21 NOTE — Progress Notes (Signed)
   10/21/20 0735  Assess: MEWS Score  Temp (!) 97.5 F (36.4 C)  BP (!) 179/102  Pulse Rate (!) 101  Resp (!) 22  SpO2 99 %  O2 Device Room Air  Assess: MEWS Score  MEWS Temp 0  MEWS Systolic 0  MEWS Pulse 1  MEWS RR 1  MEWS LOC 0  MEWS Score 2  MEWS Score Color Yellow  Assess: if the MEWS score is Yellow or Red  Were vital signs taken at a resting state? No  Focused Assessment No change from prior assessment  Early Detection of Sepsis Score *See Row Information* Low  MEWS guidelines implemented *See Row Information* No, vital signs rechecked  Treat  Pain Scale 0-10  Pain Score 0  Take Vital Signs  Increase Vital Sign Frequency  Yellow: Q 2hr X 2 then Q 4hr X 2, if remains yellow, continue Q 4hrs  Escalate  MEWS: Escalate Yellow: discuss with charge nurse/RN and consider discussing with provider and RRT  Notify: Charge Nurse/RN  Name of Charge Nurse/RN Notified Alwyn Ren, RN  Date Charge Nurse/RN Notified 10/21/20  Time Charge Nurse/RN Notified 0730  Notify: Provider  Provider Name/Title Dr. Bonner Puna  Date Provider Notified 10/21/20  Notification Type Page  Notification Reason Other (Comment) (fall)  Provider response No new orders  Date of Provider Response 10/21/20  Document  Patient Outcome Stabilized after interventions

## 2020-10-21 NOTE — Discharge Summary (Signed)
Physician Discharge Summary  Janice Curtis [id number] DOB: 1941-03-01 DOA: 10/19/2020  PCP: [facility]  Admit date: 10/19/2020 Discharge date: 10/21/2020  Admitted From: Home Disposition: Home   Recommendations for Outpatient Follow-up:  Follow up with PCP in 1-2 weeks for continued risk factor optimization. HbA1c was 6%. Follow up with neurology in 4 weeks.   Home Health: None recommended Equipment/Devices: Declined tub chair Discharge Condition: Stable, improved CODE STATUS: DNR Diet recommendation: Heart healthy  Brief/Interim Summary: Janice Curtis is a 80 y.o. 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 In the ED she was evaluated with CT of the head which was nonacute, teleneurology evaluated the patient, though her symptoms had resolved. Due to concern for right MCA/PCA TIA or lacunar infarct, admission for stroke work up and formal neurology evaluation was pursued. MRI has resulted showing no acute stroke. Neurology, Dr. Merlene Laughter suspects TIA in the right MCA distribution, recommending continuing high intensity statin therapy, control of HTN, and rigid adherence to dual antiplatelet therapy for 3 months.   Discharge Diagnoses:  Active Problems: TIA  Other chronic medical conditions were stable during observation period.  Obesity: Estimated body mass index is 33.78 kg/m as calculated from the following:   Height as of this encounter: '5\' 3"'$  (1.6 m).   Weight as of this encounter: 86.5 kg.  Discharge Instructions Discharge Instructions     Diet - low sodium heart healthy   Complete by: As directed    Discharge instructions   Complete by: As directed    You were evaluated for vision loss in the left eye that is thought to be due to a transient ischemic attack (TIA). Neurology has recommended that you take  aspirin and plavix daily without missing doses to reduce your risk of stroke. Otherwise, you may continue taking medications as you were. If your symptoms return, seek medical attention right away. Otherwise, follow up with your PCP in the next 1-2 weeks and with neurology in 4 weeks.   Increase activity slowly   Complete by: As directed       Allergies as of 10/21/2020   No Known Allergies      Medication List     TAKE these medications    acetaminophen 500 MG tablet Commonly known as: TYLENOL Take 500 mg by mouth every 6 (six) hours as needed. For pain   amLODipine 10 MG tablet Commonly known as: NORVASC Take 5 mg by mouth daily.   aspirin 81 MG EC tablet Take 1 tablet (81 mg total) by mouth daily. Swallow whole. Start taking on: October 22, 2020   clopidogrel 75 MG tablet Commonly known as: PLAVIX Take 75 mg by mouth daily.   rosuvastatin 10 MG tablet Commonly known as: CRESTOR Take 1 tablet (10 mg total) by mouth daily.   verapamil 180 MG 24 hr capsule Commonly known as: VERELAN PM Take 180 mg by mouth in the morning and at bedtime.   Vitamin D3 25 MCG (1000 UT) Caps Take 1,000 Units by mouth daily.        Follow-up Information     [facility] Follow up.   Specialty: Family Medicine Contact information: [address] [place] [address] [phone]         Phillips Odor, MD. Schedule an appointment as soon as possible for  a visit in 1 month(s).   Specialty: Neurology Contact information: [place] [address] [phone]                No Known Allergies  Consultations: Neurology  Procedures/Studies: MR BRAIN WO CONTRAST  Result Date: 10/20/2020 CLINICAL DATA:  Vision loss, dizziness EXAM: MRI HEAD WITHOUT CONTRAST TECHNIQUE: Multiplanar, multiecho pulse sequences of the brain and surrounding structures were obtained without intravenous contrast. COMPARISON:  Same-day CT/CTA head FINDINGS:  Brain: There is no evidence of acute intracranial hemorrhage, extra-axial fluid collection, or infarct. There are small remote infarcts in the right basal ganglia/corona radiata and left cerebellar hemisphere. Scattered foci of FLAIR signal abnormality throughout the subcortical and periventricular white matter nonspecific but likely reflects sequela of chronic white matter microangiopathy. There is mild global parenchymal volume loss with commensurate enlargement of the ventricular system. There is no midline shift. No mass lesion is seen. There is a single punctate chronic microhemorrhage in the right centrum semiovale, nonspecific. Vascular: Normal flow voids. The vasculature is better assessed on the same-day CT a head/neck. Skull and upper cervical spine: Normal marrow signal. Sinuses/Orbits: The paranasal sinuses are clear. The globes and orbits are unremarkable. Other: None. IMPRESSION: 1. No acute intracranial pathology. 2. Small remote infarcts in the right basal ganglia and left cerebellar hemisphere. 3. Mild parenchymal volume loss and chronic white matter microangiopathy. Electronically Signed   By: Valetta Mole M.D.   On: 10/20/2020 10:54   ECHOCARDIOGRAM COMPLETE  Result Date: 10/20/2020    ECHOCARDIOGRAM REPORT   Patient Name:   Janice Curtis Date of Exam: 10/20/2020 Medical Rec #:  [id number]       Height:       63.0 in Accession #:    [id number]      Weight:       188.1 lb Date of Birth:  02/01/1941       BSA:          1.884 m Patient Age:    18 years        BP:           156/90 mmHg Patient Gender: F               HR:           62 bpm. Exam Location:  [facility] Procedure: 2D Echo, Cardiac Doppler and Color Doppler Indications:    Stroke  History:        Patient has prior history of Echocardiogram examinations, most                 recent 02/08/2020. Stroke, Arrythmias:LBBB; Risk                 Factors:Hypertension and Dyslipidemia.  Sonographer:    Wenda Low Referring Phys: [id number] ASIA  B Rand  1. Left ventricular ejection fraction, by estimation, is 60 to 65%. The left ventricle has normal function. The left ventricle has no regional wall motion abnormalities. There is mild left ventricular hypertrophy. Left ventricular diastolic parameters are indeterminate.  2. Right ventricular systolic function is normal. The right ventricular size is normal. There is mildly elevated pulmonary artery systolic pressure. The estimated right ventricular systolic pressure is 123456 mmHg.  3. Left atrial size was moderately dilated.  4. Right atrial size was mildly dilated.  5. The mitral valve is grossly normal. Mild mitral valve regurgitation.  6. Tricuspid valve regurgitation is moderate.  7. The aortic valve is tricuspid. There  is mild calcification of the aortic valve. Aortic valve regurgitation is trivial. Mild aortic valve sclerosis is present, with no evidence of aortic valve stenosis. Aortic valve mean gradient measures 2.5 mmHg.  8. The inferior vena cava is normal in size with greater than 50% respiratory variability, suggesting right atrial pressure of 3 mmHg. Comparison(s): No significant change from prior study. FINDINGS  Left Ventricle: Left ventricular ejection fraction, by estimation, is 60 to 65%. The left ventricle has normal function. The left ventricle has no regional wall motion abnormalities. The left ventricular internal cavity size was normal in size. There is  mild left ventricular hypertrophy. Left ventricular diastolic parameters are indeterminate. Right Ventricle: The right ventricular size is normal. No increase in right ventricular wall thickness. Right ventricular systolic function is normal. There is mildly elevated pulmonary artery systolic pressure. The tricuspid regurgitant velocity is 2.91  m/s, and with an assumed right atrial pressure of 3 mmHg, the estimated right ventricular systolic pressure is 123456 mmHg. Left Atrium: Left atrial size was moderately  dilated. Right Atrium: Right atrial size was mildly dilated. Pericardium: There is no evidence of pericardial effusion. Mitral Valve: The mitral valve is grossly normal. There is mild calcification of the mitral valve leaflet(s). Mild mitral valve regurgitation. MV peak gradient, 7.4 mmHg. The mean mitral valve gradient is 1.0 mmHg. Tricuspid Valve: The tricuspid valve is grossly normal. Tricuspid valve regurgitation is moderate. Aortic Valve: The aortic valve is tricuspid. There is mild calcification of the aortic valve. There is mild to moderate aortic valve annular calcification. Aortic valve regurgitation is trivial. Mild aortic valve sclerosis is present, with no evidence of  aortic valve stenosis. Aortic valve mean gradient measures 2.5 mmHg. Aortic valve peak gradient measures 5.1 mmHg. Pulmonic Valve: The pulmonic valve was grossly normal. Pulmonic valve regurgitation is trivial. Aorta: The aortic root is normal in size and structure. Venous: The inferior vena cava is normal in size with greater than 50% respiratory variability, suggesting right atrial pressure of 3 mmHg. IAS/Shunts: No atrial level shunt detected by color flow Doppler.  LEFT VENTRICLE PLAX 2D LVIDd:         4.08 cm Diastology LVIDs:         2.45 cm LV e' medial:    10.40 cm/s LV PW:         0.97 cm LV E/e' medial:  9.9 LV IVS:        1.13 cm LV e' lateral:   13.70 cm/s                        LV E/e' lateral: 7.5  RIGHT VENTRICLE RV Basal diam:  3.48 cm RV Mid diam:    2.53 cm RV S prime:     14.10 cm/s TAPSE (M-mode): 1.9 cm LEFT ATRIUM             Index       RIGHT ATRIUM           Index LA diam:        5.20 cm 2.76 cm/m  RA Area:     21.30 cm LA Vol (A2C):   88.6 ml 47.04 ml/m RA Volume:   64.70 ml  34.35 ml/m LA Vol (A4C):   84.7 ml 44.97 ml/m LA Biplane Vol: 89.3 ml 47.41 ml/m  AORTIC VALVE AV Vmax:           113.00 cm/s AV Vmean:  82.600 cm/s AV VTI:            0.267 m AV Peak Grad:      5.1 mmHg AV Mean Grad:      2.5  mmHg LVOT Vmax:         98.50 cm/s LVOT Vmean:        68.000 cm/s LVOT VTI:          0.217 m LVOT/AV VTI ratio: 0.81  AORTA Ao Root diam: 2.70 cm Ao Asc diam:  3.30 cm MITRAL VALVE                TRICUSPID VALVE MV Area (PHT): 3.17 cm     TR Peak grad:   33.9 mmHg MV Peak grad:  7.4 mmHg     TR Vmax:        291.00 cm/s MV Mean grad:  1.0 mmHg MV Vmax:       1.36 m/s     SHUNTS MV Vmean:      49.6 cm/s    Systemic VTI: 0.22 m MV Decel Time: 239 msec MV E velocity: 103.00 cm/s Rozann Lesches MD Electronically signed by Rozann Lesches MD Signature Date/Time: 10/20/2020/5:10:32 PM    Final    CT HEAD CODE STROKE WO CONTRAST  Result Date: 10/20/2020 CLINICAL DATA:  Code stroke.  Visual field defect EXAM: CT HEAD WITHOUT CONTRAST TECHNIQUE: Contiguous axial images were obtained from the base of the skull through the vertex without intravenous contrast. COMPARISON:  None. FINDINGS: Brain: There is no mass, hemorrhage or extra-axial collection. The size and configuration of the ventricles and extra-axial CSF spaces are normal. Old small vessel infarct of the right caudate head. Vascular: No abnormal hyperdensity of the major intracranial arteries or dural venous sinuses. No intracranial atherosclerosis. Skull: The visualized skull base, calvarium and extracranial soft tissues are normal. Sinuses/Orbits: No fluid levels or advanced mucosal thickening of the visualized paranasal sinuses. No mastoid or middle ear effusion. The orbits are normal. ASPECTS [facility] Stroke Program Early CT Score) - Ganglionic level infarction (caudate, lentiform nuclei, internal capsule, insula, M1-M3 cortex): 7 - Supraganglionic infarction (M4-M6 cortex): 3 Total score (0-10 with 10 being normal): 10 IMPRESSION: 1. No acute intracranial abnormality. 2. Old small vessel infarct of the right caudate head. 3. ASPECTS is 10. These results were called by telephone at the time of interpretation on 10/20/2020 at 12:07 am to provider [facility] ,  who verbally acknowledged these results. Electronically Signed   By: Ulyses Jarred M.D.   On: 10/20/2020 00:07   CT ANGIO HEAD NECK W WO CM W PERF (CODE STROKE)  Result Date: 10/20/2020 CLINICAL DATA:  Left eye blurry vision EXAM: CT ANGIOGRAPHY HEAD AND NECK CT PERFUSION BRAIN TECHNIQUE: Multidetector CT imaging of the head and neck was performed using the standard protocol during bolus administration of intravenous contrast. Multiplanar CT image reconstructions and MIPs were obtained to evaluate the vascular anatomy. Carotid stenosis measurements (when applicable) are obtained utilizing NASCET criteria, using the distal internal carotid diameter as the denominator. Multiphase CT imaging of the brain was performed following IV bolus contrast injection. Subsequent parametric perfusion maps were calculated using RAPID software. CONTRAST:  160m OMNIPAQUE IOHEXOL 350 MG/ML SOLN COMPARISON:  None. FINDINGS: CTA NECK FINDINGS SKELETON: There is no bony spinal canal stenosis. No lytic or blastic lesion. OTHER NECK: Normal pharynx, larynx and major salivary glands. No cervical lymphadenopathy. Unremarkable thyroid gland. UPPER CHEST: Small right pleural effusion AORTIC ARCH: There is calcific atherosclerosis  of the aortic arch. There is no aneurysm, dissection or hemodynamically significant stenosis of the visualized portion of the aorta. Conventional 3 vessel aortic branching pattern. The visualized proximal subclavian arteries are widely patent. RIGHT CAROTID SYSTEM: No dissection, occlusion or aneurysm. There is predominantly calcified atherosclerosis extending into the proximal ICA, resulting in 70% stenosis. LEFT CAROTID SYSTEM: No dissection, occlusion or aneurysm. There is calcified atherosclerosis extending into the proximal ICA, resulting in 60% stenosis. VERTEBRAL ARTERIES: Right dominant configuration. Both origins are clearly patent. The left vertebral artery becomes occluded at the V2 V3 junction. The  right vertebral artery is normal along its cervical course. CTA HEAD FINDINGS POSTERIOR CIRCULATION: --Vertebral arteries: Left is occluded with minimal distal reconstitution by collateral flow. Right-sided is normal. --Inferior cerebellar arteries: Normal. --Basilar artery: Normal. --Superior cerebellar arteries: Normal. --Posterior cerebral arteries (PCA): Normal. ANTERIOR CIRCULATION: --Intracranial internal carotid arteries: Normal. --Anterior cerebral arteries (ACA): Normal. Both A1 segments are present. --Middle cerebral arteries (MCA): Multifocal moderate-to-severe stenosis of the right M1 segment, which remains patent. Mild atherosclerotic irregularity of distal left M1 segment. VENOUS SINUSES: As permitted by contrast timing, patent. ANATOMIC VARIANTS: None Review of the MIP images confirms the above findings. CT Brain Perfusion Findings: ASPECTS: 10 CBF (<30%) Volume: 64m Perfusion (Tmax>6.0s) volume: 053mMismatch Volume: 21m36mnfarction Location:None IMPRESSION: 1. No emergent large vessel occlusion. 2. Occlusion of the left vertebral artery at the V2 V3 junction. 3. Multifocal moderate-to-severe stenosis of the right MCA M1 segment. Mild atherosclerotic irregularity of the left M1 segment. 4. Bilateral proximal internal carotid artery stenosis, measuring 70% on the right and 60% on the left. 5. Small right pleural effusion. Aortic atherosclerosis (ICD10-I70.0). Electronically Signed   By: KevUlyses JarredD.   On: 10/20/2020 01:09     Subjective: Felt woozy this morning and threw up her breakfast. Otherwise feels ok. Vision durably back to normal. No new weakness or numbness. Later tolerated lunch without further issues.  Discharge Exam: Vitals:   10/21/20 0800 10/21/20 0940  BP:  (!) 158/88  Pulse: 98 96  Resp: 18   Temp:  97.8 F (36.6 C)  SpO2:  99%   General: Pt is alert, awake, not in acute distress Cardiovascular: RRR, S1/S2 +, no rubs, no gallops Respiratory: CTA bilaterally, no  wheezing, no rhonchi Abdominal: Soft, NT, ND, bowel sounds +. Very benign. Extremities: No edema, no cyanosis  Labs: BNP (last 3 results) No results for input(s): BNP in the last 8760 hours. Basic Metabolic Panel: Recent Labs  Lab 10/19/20 2348 10/19/20 2357 10/20/20 0428 10/21/20 0531  NA 132* 136 134* 134*  K 3.6 3.8 3.7 3.4*  CL 101 101 101 102  CO2 23  --  25 25  GLUCOSE 121* 122* 108* 92  BUN '11 9 9 8  '$ CREATININE 0.62 0.60 0.54 0.54  CALCIUM 8.8*  --  8.7* 8.0*  MG  --   --  1.9  --    Liver Function Tests: Recent Labs  Lab 10/19/20 2348 10/20/20 0428 10/21/20 0531  AST 52* 39 31  ALT 68* 57* 45*  ALKPHOS 97 84 80  BILITOT 0.7 0.6 1.1  PROT 7.9 6.7 6.6  ALBUMIN 4.1 3.6 3.5   No results for input(s): LIPASE, AMYLASE in the last 168 hours. No results for input(s): AMMONIA in the last 168 hours. CBC: Recent Labs  Lab 10/19/20 2348 10/19/20 2357 10/20/20 0428 10/21/20 0531  WBC 5.5  --  5.1 4.3  NEUTROABS 2.6  --   --   --  HGB 12.8 13.3 11.4* 11.5*  HCT 38.9 39.0 36.1 36.2  MCV 82.2  --  84.3 82.8  PLT 186  --  152 172   Cardiac Enzymes: No results for input(s): CKTOTAL, CKMB, CKMBINDEX, TROPONINI in the last 168 hours. BNP: Invalid input(s): POCBNP CBG: Recent Labs  Lab 10/19/20 2354  GLUCAP 116*   D-Dimer No results for input(s): DDIMER in the last 72 hours. Hgb A1c Recent Labs    10/20/20 1529  HGBA1C 6.0*   Lipid Profile Recent Labs    10/20/20 1529  CHOL 132  HDL 43  LDLCALC 75  TRIG 69  CHOLHDL 3.1   Thyroid function studies No results for input(s): TSH, T4TOTAL, T3FREE, THYROIDAB in the last 72 hours.  Invalid input(s): FREET3 Anemia work up No results for input(s): VITAMINB12, FOLATE, FERRITIN, TIBC, IRON, RETICCTPCT in the last 72 hours. Urinalysis    Component Value Date/Time   COLORURINE STRAW (A) 10/20/2020 0316   APPEARANCEUR CLEAR 10/20/2020 0316   LABSPEC 1.042 (H) 10/20/2020 0316   PHURINE 6.0 10/20/2020  0316   GLUCOSEU NEGATIVE 10/20/2020 0316   HGBUR NEGATIVE 10/20/2020 0316   BILIRUBINUR NEGATIVE 10/20/2020 0316   KETONESUR NEGATIVE 10/20/2020 0316   PROTEINUR NEGATIVE 10/20/2020 0316   UROBILINOGEN 0.2 11/11/2011 1026   NITRITE NEGATIVE 10/20/2020 0316   LEUKOCYTESUR TRACE (A) 10/20/2020 0316    Microbiology Recent Results (from the past 240 hour(s))  Resp Panel by RT-PCR (Flu A&B, Covid) Nasopharyngeal Swab     Status: None   Collection Time: 10/19/20 11:48 PM   Specimen: Nasopharyngeal Swab; Nasopharyngeal(NP) swabs in vial transport medium  Result Value Ref Range Status   SARS Coronavirus 2 by RT PCR NEGATIVE NEGATIVE Final    Comment: (NOTE) SARS-CoV-2 target nucleic acids are NOT DETECTED.  The SARS-CoV-2 RNA is generally detectable in upper respiratory specimens during the acute phase of infection. The lowest concentration of SARS-CoV-2 viral copies this assay can detect is 138 copies/mL. A negative result does not preclude SARS-Cov-2 infection and should not be used as the sole basis for treatment or other patient management decisions. A negative result may occur with  improper specimen collection/handling, submission of specimen other than nasopharyngeal swab, presence of viral mutation(s) within the areas targeted by this assay, and inadequate number of viral copies(<138 copies/mL). A negative result must be combined with clinical observations, patient history, and epidemiological information. The expected result is Negative.  Fact Sheet for Patients:  EntrepreneurPulse.com.au  Fact Sheet for Healthcare Providers:  IncredibleEmployment.be  This test is no t yet approved or cleared by the [place] FDA and  has been authorized for detection and/or diagnosis of SARS-CoV-2 by FDA under an Emergency Use Authorization (EUA). This EUA will remain  in effect (meaning this test can be used) for the duration of the COVID-19  declaration under Section 564(b)(1) of the Act, 21 U.S.C.section 360bbb-3(b)(1), unless the authorization is terminated  or revoked sooner.       Influenza A by PCR NEGATIVE NEGATIVE Final   Influenza B by PCR NEGATIVE NEGATIVE Final    Comment: (NOTE) The Xpert Xpress SARS-CoV-2/FLU/RSV plus assay is intended as an aid in the diagnosis of influenza from Nasopharyngeal swab specimens and should not be used as a sole basis for treatment. Nasal washings and aspirates are unacceptable for Xpert Xpress SARS-CoV-2/FLU/RSV testing.  Fact Sheet for Patients: EntrepreneurPulse.com.au  Fact Sheet for Healthcare Providers: IncredibleEmployment.be  This test is not yet approved or cleared by the [place] and  has been authorized for detection and/or diagnosis of SARS-CoV-2 by FDA under an Emergency Use Authorization (EUA). This EUA will remain in effect (meaning this test can be used) for the duration of the COVID-19 declaration under Section 564(b)(1) of the Act, 21 U.S.C. section 360bbb-3(b)(1), unless the authorization is terminated or revoked.  Performed at [facility], [address], [place], [place] [address]     Time coordinating discharge: Approximately 40 minutes  Patrecia Pour, MD  [facility] 10/21/2020, 11:58 AM

## 2020-10-21 NOTE — Evaluation (Signed)
Occupational Therapy Evaluation Patient Details Name: Janice Curtis MRN: [id number] DOB: [birth year] Today's Date: 10/21/2020    History of Present Illness Janice Curtis  is a 80 y.o. female, with history of anxiety, colon cancer, hyperlipidemia, hypertension, and stroke, presents the ED with a chief complaint of vision loss.  Of note patient's previous stroke was 10 years ago.  She reports minimal left-sided weakness as her residual deficit.  Today she was watching TV when she had sudden vision loss in her left eye.  She got up and went to the kitchen and noticed that she could not see the first half of phone numbers and other items on her fridge.  She felt dizzy, but she had no headache. MRI negative for acute stroke. Pt reports symptoms have resolved.   Clinical Impression   Pt found lying left side on floor on OT arrival this am, alert and oriented, reporting she was reaching down for her tissue and rolled out of the chair. Nursing alerted, pt able to get up from floor with min assist. Pt demonstrating mod independence in ADLs, no visual deficits present this am. Pt is at baseline with functional task completion, no further OT services required at this time.     Follow Up Recommendations  No OT follow up    Equipment Recommendations  Tub/shower seat       Precautions / Restrictions Precautions Precautions: Fall Restrictions Weight Bearing Restrictions: No      Mobility Bed Mobility               General bed mobility comments: Pt out of bed on OT arrival    Transfers Overall transfer level: Modified independent Equipment used: None                      ADL either performed or assessed with clinical judgement   ADL Overall ADL's : Modified independent;At baseline                                       General ADL Comments: Pt performing ADLs without difficulty     Vision Baseline Vision/History: Wears glasses Wears Glasses: Reading  only Patient Visual Report: No change from baseline Vision Assessment?: Yes Eye Alignment: Within Functional Limits Ocular Range of Motion: Within Functional Limits Alignment/Gaze Preference: Within Defined Limits Tracking/Visual Pursuits: Able to track stimulus in all quads without difficulty Saccades: Within functional limits Convergence: Within functional limits Visual Fields: No apparent deficits            Pertinent Vitals/Pain Pain Assessment: No/denies pain     Hand Dominance Right   Extremity/Trunk Assessment Upper Extremity Assessment Upper Extremity Assessment: Overall WFL for tasks assessed   Lower Extremity Assessment Lower Extremity Assessment: Defer to PT evaluation   Cervical / Trunk Assessment Cervical / Trunk Assessment: Normal   Communication Communication Communication: No difficulties   Cognition Arousal/Alertness: Awake/alert Behavior During Therapy: WFL for tasks assessed/performed Overall Cognitive Status: Within Functional Limits for tasks assessed                                                Home Living Family/patient expects to be discharged to:: Private residence Living Arrangements: Other (Comment) (grandson lives with pt (14 yrs)) Available Help at  Discharge: Family;Available PRN/intermittently Type of Home: House Home Access: Level entry     Home Layout: One level     Bathroom Shower/Tub: Teacher, early years/pre: Standard     Home Equipment: Grab bars - tub/shower          Prior Functioning/Environment Level of Independence: Independent        Comments: independent in mobility and ADLs, drives        OT Problem List: Impaired vision/perception       AM-PAC OT "6 Clicks" Daily Activity     Outcome Measure Help from another person eating meals?: None Help from another person taking care of personal grooming?: None Help from another person toileting, which includes using toliet,  bedpan, or urinal?: None Help from another person bathing (including washing, rinsing, drying)?: None Help from another person to put on and taking off regular upper body clothing?: None Help from another person to put on and taking off regular lower body clothing?: None 6 Click Score: 24   End of Session Nurse Communication: Mobility status;Other (comment) (pt on floor on OT arrival)  Activity Tolerance: Patient tolerated treatment well Patient left: in chair;with call bell/phone within reach  OT Visit Diagnosis: Muscle weakness (generalized) (M62.81)                Time: [id number] OT Time Calculation (min): 25 min Charges:  OT General Charges $OT Visit: 1 Visit OT Evaluation $OT Eval Low Complexity: [place], OTR/L  [phone] 10/21/2020, 7:55 AM

## 2020-10-21 NOTE — Progress Notes (Signed)
0725: Received call via call bell that pt was lying in floor. To room to find PT staff member kneeling in floor beside pt, who was sitting in floor on far side of bed. Pt awake, alert, oriented x 4. Pt states, "I was in the chair here and I dropped my tissue. I bent over to pick it up and I just tumbled out of the chair into the floor." Pt denies any c/o pain or injury, physical survey reveals no obvious injuries. Pupils equal (3), round, brisk response. VS obtained, b/p slightly elevated, otherwise WNL. Pt able to roll over onto her knees and get up out of floor with only minimal assist. Pt ambulated back to bed without difficulty. Assigned nurse at bedside to complete am assessment.

## 2020-10-21 NOTE — Progress Notes (Signed)
SLP Cancellation Note  Patient Details Name: Janice Curtis MRN: [id number] DOB: Feb 26, 1941   Cancelled treatment:       Reason Eval/Treat Not Completed: SLP screened, no needs identified, will sign off. Speech-language and cognition are at baseline. Thank you for this referral,  Nikki Glanzer H. Roddie Mc, CCC-SLP Speech Language Pathologist    Wende Bushy 10/21/2020, 9:07 AM

## 2020-10-21 NOTE — Progress Notes (Signed)
CSW updated that OT was recommending a tub/shower seat for pt. CSW spoke with pt about interest in this. Pt states that she will not sit down in the shower and she has a bar to hold on to. TOC signing off.

## 2020-10-26 ENCOUNTER — Other Ambulatory Visit (HOSPITAL_COMMUNITY): Payer: Self-pay | Admitting: Internal Medicine

## 2020-10-26 DIAGNOSIS — E6609 Other obesity due to excess calories: Secondary | ICD-10-CM | POA: Diagnosis not present

## 2020-10-26 DIAGNOSIS — E2839 Other primary ovarian failure: Secondary | ICD-10-CM

## 2020-10-26 DIAGNOSIS — Z6832 Body mass index (BMI) 32.0-32.9, adult: Secondary | ICD-10-CM | POA: Diagnosis not present

## 2020-10-26 DIAGNOSIS — E876 Hypokalemia: Secondary | ICD-10-CM | POA: Diagnosis not present

## 2020-10-26 DIAGNOSIS — G459 Transient cerebral ischemic attack, unspecified: Secondary | ICD-10-CM | POA: Diagnosis not present

## 2020-10-26 DIAGNOSIS — I10 Essential (primary) hypertension: Secondary | ICD-10-CM | POA: Diagnosis not present

## 2020-12-06 ENCOUNTER — Ambulatory Visit (HOSPITAL_COMMUNITY)
Admission: RE | Admit: 2020-12-06 | Discharge: 2020-12-06 | Disposition: A | Discharge status: Home / Self Care | Payer: Medicare HMO | Source: Ambulatory Visit | Attending: Internal Medicine | Admitting: Internal Medicine

## 2020-12-06 ENCOUNTER — Other Ambulatory Visit: Payer: Self-pay

## 2020-12-06 DIAGNOSIS — M85852 Other specified disorders of bone density and structure, left thigh: Secondary | ICD-10-CM | POA: Diagnosis not present

## 2020-12-06 DIAGNOSIS — E2839 Other primary ovarian failure: Secondary | ICD-10-CM | POA: Insufficient documentation

## 2020-12-06 DIAGNOSIS — Z780 Asymptomatic menopausal state: Secondary | ICD-10-CM | POA: Diagnosis not present

## 2020-12-26 ENCOUNTER — Other Ambulatory Visit (HOSPITAL_COMMUNITY): Payer: Self-pay | Admitting: Internal Medicine

## 2020-12-26 DIAGNOSIS — Z1231 Encounter for screening mammogram for malignant neoplasm of breast: Secondary | ICD-10-CM

## 2021-01-09 ENCOUNTER — Other Ambulatory Visit: Payer: Self-pay

## 2021-01-09 ENCOUNTER — Ambulatory Visit (HOSPITAL_COMMUNITY)
Admission: RE | Admit: 2021-01-09 | Discharge: 2021-01-09 | Disposition: A | Discharge status: Home / Self Care | Payer: Medicare HMO | Source: Ambulatory Visit | Attending: Internal Medicine | Admitting: Internal Medicine

## 2021-01-09 DIAGNOSIS — Z1231 Encounter for screening mammogram for malignant neoplasm of breast: Secondary | ICD-10-CM | POA: Insufficient documentation

## 2021-02-06 ENCOUNTER — Other Ambulatory Visit: Payer: Self-pay | Admitting: Internal Medicine

## 2021-02-06 DIAGNOSIS — I509 Heart failure, unspecified: Secondary | ICD-10-CM

## 2021-02-06 DIAGNOSIS — I071 Rheumatic tricuspid insufficiency: Secondary | ICD-10-CM

## 2021-03-01 ENCOUNTER — Other Ambulatory Visit: Payer: Self-pay | Admitting: Internal Medicine

## 2021-03-01 DIAGNOSIS — I071 Rheumatic tricuspid insufficiency: Secondary | ICD-10-CM

## 2021-03-01 DIAGNOSIS — I509 Heart failure, unspecified: Secondary | ICD-10-CM

## 2021-03-09 DIAGNOSIS — Z6833 Body mass index (BMI) 33.0-33.9, adult: Secondary | ICD-10-CM | POA: Diagnosis not present

## 2021-03-09 DIAGNOSIS — R0989 Other specified symptoms and signs involving the circulatory and respiratory systems: Secondary | ICD-10-CM | POA: Diagnosis not present

## 2021-03-09 DIAGNOSIS — I10 Essential (primary) hypertension: Secondary | ICD-10-CM | POA: Diagnosis not present

## 2021-03-09 DIAGNOSIS — Z8673 Personal history of transient ischemic attack (TIA), and cerebral infarction without residual deficits: Secondary | ICD-10-CM | POA: Diagnosis not present

## 2021-03-09 DIAGNOSIS — E782 Mixed hyperlipidemia: Secondary | ICD-10-CM | POA: Diagnosis not present

## 2021-03-09 DIAGNOSIS — Z1331 Encounter for screening for depression: Secondary | ICD-10-CM | POA: Diagnosis not present

## 2021-03-09 DIAGNOSIS — E669 Obesity, unspecified: Secondary | ICD-10-CM | POA: Diagnosis not present

## 2021-03-09 DIAGNOSIS — Z0001 Encounter for general adult medical examination with abnormal findings: Secondary | ICD-10-CM | POA: Diagnosis not present

## 2021-03-09 DIAGNOSIS — I361 Nonrheumatic tricuspid (valve) insufficiency: Secondary | ICD-10-CM | POA: Diagnosis not present

## 2021-05-14 ENCOUNTER — Other Ambulatory Visit: Payer: Self-pay | Admitting: Internal Medicine

## 2021-05-14 DIAGNOSIS — I071 Rheumatic tricuspid insufficiency: Secondary | ICD-10-CM

## 2021-05-14 DIAGNOSIS — I509 Heart failure, unspecified: Secondary | ICD-10-CM

## 2021-05-24 ENCOUNTER — Other Ambulatory Visit: Payer: Self-pay | Admitting: Internal Medicine

## 2021-05-24 DIAGNOSIS — I509 Heart failure, unspecified: Secondary | ICD-10-CM

## 2021-05-24 DIAGNOSIS — I071 Rheumatic tricuspid insufficiency: Secondary | ICD-10-CM

## 2021-06-11 ENCOUNTER — Other Ambulatory Visit: Payer: Self-pay | Admitting: Cardiology

## 2021-06-11 DIAGNOSIS — I071 Rheumatic tricuspid insufficiency: Secondary | ICD-10-CM

## 2021-06-11 DIAGNOSIS — I509 Heart failure, unspecified: Secondary | ICD-10-CM

## 2021-06-29 ENCOUNTER — Other Ambulatory Visit: Payer: Self-pay | Admitting: Cardiology

## 2021-06-29 DIAGNOSIS — I509 Heart failure, unspecified: Secondary | ICD-10-CM

## 2021-06-29 DIAGNOSIS — I071 Rheumatic tricuspid insufficiency: Secondary | ICD-10-CM

## 2021-07-21 ENCOUNTER — Other Ambulatory Visit: Payer: Self-pay | Admitting: Cardiology

## 2021-07-21 DIAGNOSIS — I071 Rheumatic tricuspid insufficiency: Secondary | ICD-10-CM

## 2021-07-21 DIAGNOSIS — I509 Heart failure, unspecified: Secondary | ICD-10-CM

## 2021-12-07 ENCOUNTER — Other Ambulatory Visit (HOSPITAL_COMMUNITY): Payer: Self-pay | Admitting: Internal Medicine

## 2021-12-07 DIAGNOSIS — Z1231 Encounter for screening mammogram for malignant neoplasm of breast: Secondary | ICD-10-CM

## 2021-12-09 ENCOUNTER — Emergency Department (HOSPITAL_COMMUNITY): Payer: Medicare HMO

## 2021-12-09 ENCOUNTER — Inpatient Hospital Stay (HOSPITAL_COMMUNITY)
Admission: EM | Admit: 2021-12-09 | Discharge: 2021-12-15 | DRG: 023 | Disposition: A | Discharge status: Skilled Nursing Facility | Payer: Medicare HMO | Attending: Neurology | Admitting: Neurology

## 2021-12-09 ENCOUNTER — Emergency Department (HOSPITAL_COMMUNITY): Payer: Medicare HMO | Admitting: Certified Registered"

## 2021-12-09 ENCOUNTER — Other Ambulatory Visit: Payer: Self-pay

## 2021-12-09 ENCOUNTER — Encounter (HOSPITAL_COMMUNITY)
Admission: EM | Disposition: A | Discharge status: Skilled Nursing Facility | Payer: Self-pay | Source: Home / Self Care | Attending: Neurology

## 2021-12-09 ENCOUNTER — Encounter (HOSPITAL_COMMUNITY): Payer: Self-pay | Admitting: Emergency Medicine

## 2021-12-09 ENCOUNTER — Inpatient Hospital Stay (HOSPITAL_COMMUNITY): Payer: Medicare HMO

## 2021-12-09 DIAGNOSIS — I63412 Cerebral infarction due to embolism of left middle cerebral artery: Principal | ICD-10-CM | POA: Diagnosis present

## 2021-12-09 DIAGNOSIS — H5462 Unqualified visual loss, left eye, normal vision right eye: Secondary | ICD-10-CM | POA: Diagnosis present

## 2021-12-09 DIAGNOSIS — E7800 Pure hypercholesterolemia, unspecified: Secondary | ICD-10-CM | POA: Diagnosis present

## 2021-12-09 DIAGNOSIS — I6602 Occlusion and stenosis of left middle cerebral artery: Secondary | ICD-10-CM | POA: Diagnosis present

## 2021-12-09 DIAGNOSIS — G8191 Hemiplegia, unspecified affecting right dominant side: Secondary | ICD-10-CM | POA: Diagnosis not present

## 2021-12-09 DIAGNOSIS — Z823 Family history of stroke: Secondary | ICD-10-CM | POA: Diagnosis not present

## 2021-12-09 DIAGNOSIS — I609 Nontraumatic subarachnoid hemorrhage, unspecified: Secondary | ICD-10-CM | POA: Diagnosis not present

## 2021-12-09 DIAGNOSIS — R4701 Aphasia: Secondary | ICD-10-CM

## 2021-12-09 DIAGNOSIS — R791 Abnormal coagulation profile: Secondary | ICD-10-CM | POA: Diagnosis present

## 2021-12-09 DIAGNOSIS — R0689 Other abnormalities of breathing: Secondary | ICD-10-CM | POA: Diagnosis not present

## 2021-12-09 DIAGNOSIS — J9690 Respiratory failure, unspecified, unspecified whether with hypoxia or hypercapnia: Secondary | ICD-10-CM | POA: Diagnosis not present

## 2021-12-09 DIAGNOSIS — Z1152 Encounter for screening for COVID-19: Secondary | ICD-10-CM | POA: Diagnosis not present

## 2021-12-09 DIAGNOSIS — I63512 Cerebral infarction due to unspecified occlusion or stenosis of left middle cerebral artery: Secondary | ICD-10-CM | POA: Diagnosis not present

## 2021-12-09 DIAGNOSIS — Z9049 Acquired absence of other specified parts of digestive tract: Secondary | ICD-10-CM

## 2021-12-09 DIAGNOSIS — R29810 Facial weakness: Secondary | ICD-10-CM | POA: Diagnosis present

## 2021-12-09 DIAGNOSIS — R1312 Dysphagia, oropharyngeal phase: Secondary | ICD-10-CM | POA: Diagnosis present

## 2021-12-09 DIAGNOSIS — I10 Essential (primary) hypertension: Secondary | ICD-10-CM | POA: Diagnosis present

## 2021-12-09 DIAGNOSIS — I4891 Unspecified atrial fibrillation: Secondary | ICD-10-CM | POA: Diagnosis not present

## 2021-12-09 DIAGNOSIS — R531 Weakness: Secondary | ICD-10-CM | POA: Diagnosis not present

## 2021-12-09 DIAGNOSIS — R29730 NIHSS score 30: Secondary | ICD-10-CM | POA: Diagnosis present

## 2021-12-09 DIAGNOSIS — E669 Obesity, unspecified: Secondary | ICD-10-CM

## 2021-12-09 DIAGNOSIS — I69391 Dysphagia following cerebral infarction: Secondary | ICD-10-CM

## 2021-12-09 DIAGNOSIS — F419 Anxiety disorder, unspecified: Secondary | ICD-10-CM | POA: Diagnosis present

## 2021-12-09 DIAGNOSIS — I161 Hypertensive emergency: Secondary | ICD-10-CM | POA: Diagnosis not present

## 2021-12-09 DIAGNOSIS — Z6834 Body mass index (BMI) 34.0-34.9, adult: Secondary | ICD-10-CM | POA: Diagnosis not present

## 2021-12-09 DIAGNOSIS — I639 Cerebral infarction, unspecified: Secondary | ICD-10-CM | POA: Diagnosis present

## 2021-12-09 DIAGNOSIS — W19XXXA Unspecified fall, initial encounter: Secondary | ICD-10-CM | POA: Diagnosis not present

## 2021-12-09 DIAGNOSIS — S066X0A Traumatic subarachnoid hemorrhage without loss of consciousness, initial encounter: Secondary | ICD-10-CM | POA: Diagnosis not present

## 2021-12-09 DIAGNOSIS — R414 Neurologic neglect syndrome: Secondary | ICD-10-CM | POA: Diagnosis not present

## 2021-12-09 DIAGNOSIS — Z7902 Long term (current) use of antithrombotics/antiplatelets: Secondary | ICD-10-CM

## 2021-12-09 DIAGNOSIS — Z8249 Family history of ischemic heart disease and other diseases of the circulatory system: Secondary | ICD-10-CM

## 2021-12-09 DIAGNOSIS — R471 Dysarthria and anarthria: Secondary | ICD-10-CM | POA: Diagnosis present

## 2021-12-09 DIAGNOSIS — I6522 Occlusion and stenosis of left carotid artery: Secondary | ICD-10-CM | POA: Diagnosis not present

## 2021-12-09 DIAGNOSIS — Z4682 Encounter for fitting and adjustment of non-vascular catheter: Secondary | ICD-10-CM | POA: Diagnosis not present

## 2021-12-09 DIAGNOSIS — R1310 Dysphagia, unspecified: Secondary | ICD-10-CM

## 2021-12-09 DIAGNOSIS — Z85038 Personal history of other malignant neoplasm of large intestine: Secondary | ICD-10-CM

## 2021-12-09 DIAGNOSIS — I6389 Other cerebral infarction: Secondary | ICD-10-CM | POA: Diagnosis not present

## 2021-12-09 DIAGNOSIS — I959 Hypotension, unspecified: Secondary | ICD-10-CM | POA: Diagnosis not present

## 2021-12-09 DIAGNOSIS — Z7982 Long term (current) use of aspirin: Secondary | ICD-10-CM

## 2021-12-09 DIAGNOSIS — I63312 Cerebral infarction due to thrombosis of left middle cerebral artery: Secondary | ICD-10-CM | POA: Diagnosis not present

## 2021-12-09 DIAGNOSIS — I119 Hypertensive heart disease without heart failure: Secondary | ICD-10-CM | POA: Diagnosis not present

## 2021-12-09 DIAGNOSIS — Z743 Need for continuous supervision: Secondary | ICD-10-CM | POA: Diagnosis not present

## 2021-12-09 DIAGNOSIS — Z8673 Personal history of transient ischemic attack (TIA), and cerebral infarction without residual deficits: Secondary | ICD-10-CM

## 2021-12-09 DIAGNOSIS — E871 Hypo-osmolality and hyponatremia: Secondary | ICD-10-CM | POA: Diagnosis present

## 2021-12-09 DIAGNOSIS — R69 Illness, unspecified: Secondary | ICD-10-CM | POA: Diagnosis not present

## 2021-12-09 DIAGNOSIS — Z79899 Other long term (current) drug therapy: Secondary | ICD-10-CM

## 2021-12-09 DIAGNOSIS — E876 Hypokalemia: Secondary | ICD-10-CM | POA: Diagnosis not present

## 2021-12-09 DIAGNOSIS — R404 Transient alteration of awareness: Secondary | ICD-10-CM | POA: Diagnosis not present

## 2021-12-09 DIAGNOSIS — R402421 Glasgow coma scale score 9-12, in the field [EMT or ambulance]: Secondary | ICD-10-CM | POA: Diagnosis not present

## 2021-12-09 DIAGNOSIS — I6523 Occlusion and stenosis of bilateral carotid arteries: Secondary | ICD-10-CM | POA: Diagnosis not present

## 2021-12-09 DIAGNOSIS — G936 Cerebral edema: Secondary | ICD-10-CM | POA: Diagnosis not present

## 2021-12-09 DIAGNOSIS — E785 Hyperlipidemia, unspecified: Secondary | ICD-10-CM

## 2021-12-09 DIAGNOSIS — R29818 Other symptoms and signs involving the nervous system: Secondary | ICD-10-CM | POA: Diagnosis not present

## 2021-12-09 HISTORY — PX: IR CT HEAD LTD: IMG2386

## 2021-12-09 HISTORY — PX: IR PERCUTANEOUS ART THROMBECTOMY/INFUSION INTRACRANIAL INC DIAG ANGIO: IMG6087

## 2021-12-09 HISTORY — PX: RADIOLOGY WITH ANESTHESIA: SHX6223

## 2021-12-09 LAB — POCT I-STAT 7, (LYTES, BLD GAS, ICA,H+H)
Acid-base deficit: 1 mmol/L (ref 0.0–2.0)
Acid-base deficit: 3 mmol/L — ABNORMAL HIGH (ref 0.0–2.0)
Bicarbonate: 22.3 mmol/L (ref 20.0–28.0)
Bicarbonate: 22.9 mmol/L (ref 20.0–28.0)
Calcium, Ion: 1.11 mmol/L — ABNORMAL LOW (ref 1.15–1.40)
Calcium, Ion: 1.15 mmol/L (ref 1.15–1.40)
HCT: 33 % — ABNORMAL LOW (ref 36.0–46.0)
HCT: 33 % — ABNORMAL LOW (ref 36.0–46.0)
Hemoglobin: 11.2 g/dL — ABNORMAL LOW (ref 12.0–15.0)
Hemoglobin: 11.2 g/dL — ABNORMAL LOW (ref 12.0–15.0)
O2 Saturation: 100 %
O2 Saturation: 100 %
Patient temperature: 93
Patient temperature: 97.5
Potassium: 3.4 mmol/L — ABNORMAL LOW (ref 3.5–5.1)
Potassium: 3.7 mmol/L (ref 3.5–5.1)
Sodium: 133 mmol/L — ABNORMAL LOW (ref 135–145)
Sodium: 135 mmol/L (ref 135–145)
TCO2: 23 mmol/L (ref 22–32)
TCO2: 24 mmol/L (ref 22–32)
pCO2 arterial: 26.6 mmHg — ABNORMAL LOW (ref 32–48)
pCO2 arterial: 41 mmHg (ref 32–48)
pH, Arterial: 7.52 — ABNORMAL HIGH (ref 7.35–7.45)
pH, Arterial: 7.351 (ref 7.35–7.45)
pO2, Arterial: 168 mmHg — ABNORMAL HIGH (ref 83–108)
pO2, Arterial: 186 mmHg — ABNORMAL HIGH (ref 83–108)

## 2021-12-09 LAB — I-STAT CHEM 8, ED
BUN: 10 mg/dL (ref 8–23)
Calcium, Ion: 1.08 mmol/L — ABNORMAL LOW (ref 1.15–1.40)
Chloride: 102 mmol/L (ref 98–111)
Creatinine, Ser: 0.9 mg/dL (ref 0.44–1.00)
Glucose, Bld: 113 mg/dL — ABNORMAL HIGH (ref 70–99)
HCT: 40 % (ref 36.0–46.0)
Hemoglobin: 13.6 g/dL (ref 12.0–15.0)
Potassium: 3.2 mmol/L — ABNORMAL LOW (ref 3.5–5.1)
Sodium: 136 mmol/L (ref 135–145)
TCO2: 24 mmol/L (ref 22–32)

## 2021-12-09 LAB — PROTIME-INR
INR: 1 (ref 0.8–1.2)
Prothrombin Time: 13.3 seconds (ref 11.4–15.2)

## 2021-12-09 LAB — CBC
HCT: 38.6 % (ref 36.0–46.0)
Hemoglobin: 12.4 g/dL (ref 12.0–15.0)
MCH: 26.2 pg (ref 26.0–34.0)
MCHC: 32.1 g/dL (ref 30.0–36.0)
MCV: 81.4 fL (ref 80.0–100.0)
Platelets: 181 10*3/uL (ref 150–400)
RBC: 4.74 MIL/uL (ref 3.87–5.11)
RDW: 13.9 % (ref 11.5–15.5)
WBC: 5.2 10*3/uL (ref 4.0–10.5)
nRBC: 0 % (ref 0.0–0.2)

## 2021-12-09 LAB — MRSA NEXT GEN BY PCR, NASAL: MRSA by PCR Next Gen: NOT DETECTED

## 2021-12-09 LAB — DIFFERENTIAL
Abs Immature Granulocytes: 0.02 10*3/uL (ref 0.00–0.07)
Basophils Absolute: 0 10*3/uL (ref 0.0–0.1)
Basophils Relative: 0 %
Eosinophils Absolute: 0.2 10*3/uL (ref 0.0–0.5)
Eosinophils Relative: 4 %
Immature Granulocytes: 0 %
Lymphocytes Relative: 55 %
Lymphs Abs: 2.8 10*3/uL (ref 0.7–4.0)
Monocytes Absolute: 0.4 10*3/uL (ref 0.1–1.0)
Monocytes Relative: 7 %
Neutro Abs: 1.8 10*3/uL (ref 1.7–7.7)
Neutrophils Relative %: 34 %

## 2021-12-09 LAB — COMPREHENSIVE METABOLIC PANEL
ALT: 23 U/L (ref 0–44)
AST: 32 U/L (ref 15–41)
Albumin: 3.9 g/dL (ref 3.5–5.0)
Alkaline Phosphatase: 69 U/L (ref 38–126)
Anion gap: 9 (ref 5–15)
BUN: 12 mg/dL (ref 8–23)
CO2: 22 mmol/L (ref 22–32)
Calcium: 8.6 mg/dL — ABNORMAL LOW (ref 8.9–10.3)
Chloride: 103 mmol/L (ref 98–111)
Creatinine, Ser: 0.9 mg/dL (ref 0.44–1.00)
GFR, Estimated: >60 mL/min (ref >60)
Glucose, Bld: 114 mg/dL — ABNORMAL HIGH (ref 70–99)
Potassium: 3.2 mmol/L — ABNORMAL LOW (ref 3.5–5.1)
Sodium: 134 mmol/L — ABNORMAL LOW (ref 135–145)
Total Bilirubin: 0.7 mg/dL (ref 0.3–1.2)
Total Protein: 7.6 g/dL (ref 6.5–8.1)

## 2021-12-09 LAB — HEMOGLOBIN A1C
Hgb A1c MFr Bld: 5.7 % — ABNORMAL HIGH (ref 4.8–5.6)
Mean Plasma Glucose: 116.89 mg/dL

## 2021-12-09 LAB — APTT: aPTT: 27 seconds (ref 24–36)

## 2021-12-09 LAB — ETHANOL: Alcohol, Ethyl (B): <10 mg/dL (ref <10)

## 2021-12-09 LAB — TROPONIN I (HIGH SENSITIVITY): Troponin I (High Sensitivity): 7 ng/L (ref <18)

## 2021-12-09 LAB — SARS CORONAVIRUS 2 BY RT PCR: SARS Coronavirus 2 by RT PCR: NEGATIVE

## 2021-12-09 SURGERY — RADIOLOGY WITH ANESTHESIA
Anesthesia: General

## 2021-12-09 MED ORDER — SODIUM CHLORIDE 0.9 % IV SOLN: INTRAVENOUS | Status: DC

## 2021-12-09 MED ORDER — LABETALOL HCL 5 MG/ML IV SOLN
10.0000 mg | Freq: Once | INTRAVENOUS | Status: AC
  Administered 2021-12-09: 10 mg via INTRAVENOUS
  Filled 2021-12-09: qty 4

## 2021-12-09 MED ORDER — IOHEXOL 300 MG/ML  SOLN
150.0000 mL | Freq: Once | INTRAMUSCULAR | Status: AC | PRN
  Administered 2021-12-09: 100 mL via INTRA_ARTERIAL

## 2021-12-09 MED ORDER — ORAL CARE MOUTH RINSE
15.0000 mL | OROMUCOSAL | Status: DC
  Administered 2021-12-09 – 2021-12-10 (×8): 15 mL via OROMUCOSAL

## 2021-12-09 MED ORDER — NITROGLYCERIN 1 MG/10 ML FOR IR/CATH LAB
INTRA_ARTERIAL | Status: DC | PRN
  Administered 2021-12-09 (×3): 25 ug
  Administered 2021-12-09: 25 ug via INTRA_ARTERIAL

## 2021-12-09 MED ORDER — ACETAMINOPHEN 650 MG RE SUPP: 650.0000 mg | RECTAL | Status: DC | PRN

## 2021-12-09 MED ORDER — ACETAMINOPHEN 325 MG PO TABS
650.0000 mg | ORAL_TABLET | ORAL | Status: DC | PRN
  Filled 2021-12-09: qty 2

## 2021-12-09 MED ORDER — TENECTEPLASE FOR STROKE
0.2500 mg/kg | PACK | Freq: Once | INTRAVENOUS | Status: AC
  Administered 2021-12-09: 22 mg via INTRAVENOUS
  Filled 2021-12-09: qty 10

## 2021-12-09 MED ORDER — PROPOFOL 500 MG/50ML IV EMUL
INTRAVENOUS | Status: DC | PRN
  Administered 2021-12-09 (×2): 50 ug/kg/min via INTRAVENOUS

## 2021-12-09 MED ORDER — PROPOFOL 1000 MG/100ML IV EMUL
5.0000 ug/kg/min | INTRAVENOUS | Status: DC
  Administered 2021-12-09: 65 ug/kg/min via INTRAVENOUS
  Administered 2021-12-09: 75 ug/kg/min via INTRAVENOUS
  Administered 2021-12-09 – 2021-12-10 (×2): 35 ug/kg/min via INTRAVENOUS
  Filled 2021-12-09 (×3): qty 100

## 2021-12-09 MED ORDER — CHLORHEXIDINE GLUCONATE CLOTH 2 % EX PADS
6.0000 | MEDICATED_PAD | Freq: Every day | CUTANEOUS | Status: DC
  Administered 2021-12-10 – 2021-12-11 (×2): 6 via TOPICAL

## 2021-12-09 MED ORDER — ROCURONIUM BROMIDE 10 MG/ML (PF) SYRINGE
PREFILLED_SYRINGE | INTRAVENOUS | Status: DC | PRN
  Administered 2021-12-09: 40 mg via INTRAVENOUS

## 2021-12-09 MED ORDER — ETOMIDATE 2 MG/ML IV SOLN
INTRAVENOUS | Status: AC
  Filled 2021-12-09: qty 20

## 2021-12-09 MED ORDER — ORAL CARE MOUTH RINSE: 15.0000 mL | OROMUCOSAL | Status: DC | PRN

## 2021-12-09 MED ORDER — ACETAMINOPHEN 160 MG/5ML PO SOLN: 650.0000 mg | ORAL | Status: DC | PRN

## 2021-12-09 MED ORDER — FENTANYL CITRATE (PF) 100 MCG/2ML IJ SOLN
INTRAMUSCULAR | Status: DC | PRN
  Administered 2021-12-09: 100 ug via INTRAVENOUS

## 2021-12-09 MED ORDER — PANTOPRAZOLE SODIUM 40 MG IV SOLR
40.0000 mg | Freq: Every day | INTRAVENOUS | Status: DC
  Administered 2021-12-09 – 2021-12-13 (×5): 40 mg via INTRAVENOUS
  Filled 2021-12-09 (×4): qty 10

## 2021-12-09 MED ORDER — ACETAMINOPHEN 650 MG RE SUPP
650.0000 mg | RECTAL | Status: DC | PRN
  Administered 2021-12-10: 650 mg via RECTAL
  Filled 2021-12-09: qty 1

## 2021-12-09 MED ORDER — SODIUM CHLORIDE 0.9 % IV SOLN: INTRAVENOUS | Status: DC | PRN

## 2021-12-09 MED ORDER — PROPOFOL 10 MG/ML IV BOLUS
INTRAVENOUS | Status: DC | PRN
  Administered 2021-12-09: 100 mg via INTRAVENOUS

## 2021-12-09 MED ORDER — LIDOCAINE 2% (20 MG/ML) 5 ML SYRINGE
INTRAMUSCULAR | Status: DC | PRN
  Administered 2021-12-09: 60 mg via INTRAVENOUS

## 2021-12-09 MED ORDER — SODIUM CHLORIDE 0.9 % IV SOLN: 250.0000 mL | INTRAVENOUS | Status: DC

## 2021-12-09 MED ORDER — SUCCINYLCHOLINE CHLORIDE 200 MG/10ML IV SOSY
PREFILLED_SYRINGE | INTRAVENOUS | Status: AC
  Filled 2021-12-09: qty 10

## 2021-12-09 MED ORDER — CLEVIDIPINE BUTYRATE 0.5 MG/ML IV EMUL: 0.0000 mg/h | INTRAVENOUS | Status: AC

## 2021-12-09 MED ORDER — ONDANSETRON HCL 4 MG/2ML IJ SOLN
INTRAMUSCULAR | Status: DC | PRN
  Administered 2021-12-09: 4 mg via INTRAVENOUS

## 2021-12-09 MED ORDER — STROKE: EARLY STAGES OF RECOVERY BOOK
Freq: Once | Status: AC
  Filled 2021-12-09 (×2): qty 1

## 2021-12-09 MED ORDER — ROCURONIUM BROMIDE 10 MG/ML (PF) SYRINGE
PREFILLED_SYRINGE | INTRAVENOUS | Status: AC
  Filled 2021-12-09: qty 10

## 2021-12-09 MED ORDER — PHENYLEPHRINE HCL-NACL 20-0.9 MG/250ML-% IV SOLN: 0.0000 ug/min | INTRAVENOUS | Status: DC

## 2021-12-09 MED ORDER — CEFAZOLIN SODIUM-DEXTROSE 2-4 GM/100ML-% IV SOLN
INTRAVENOUS | Status: AC
  Filled 2021-12-09: qty 100

## 2021-12-09 MED ORDER — IOHEXOL 350 MG/ML SOLN
75.0000 mL | Freq: Once | INTRAVENOUS | Status: AC | PRN
  Administered 2021-12-09: 75 mL via INTRAVENOUS

## 2021-12-09 MED ORDER — PHENYLEPHRINE HCL-NACL 20-0.9 MG/250ML-% IV SOLN
0.0000 ug/min | INTRAVENOUS | Status: DC
  Administered 2021-12-09: 20 ug/min via INTRAVENOUS

## 2021-12-09 MED ORDER — SUCCINYLCHOLINE CHLORIDE 200 MG/10ML IV SOSY
PREFILLED_SYRINGE | INTRAVENOUS | Status: DC | PRN
  Administered 2021-12-09: 80 mg via INTRAVENOUS

## 2021-12-09 MED ORDER — CEFAZOLIN SODIUM-DEXTROSE 2-3 GM-%(50ML) IV SOLR
INTRAVENOUS | Status: DC | PRN
  Administered 2021-12-09: 2 g via INTRAVENOUS

## 2021-12-09 MED ORDER — NITROGLYCERIN 1 MG/10 ML FOR IR/CATH LAB
INTRA_ARTERIAL | Status: AC
  Filled 2021-12-09: qty 10

## 2021-12-09 MED ORDER — PHENYLEPHRINE HCL-NACL 20-0.9 MG/250ML-% IV SOLN
INTRAVENOUS | Status: DC | PRN
  Administered 2021-12-09: 50 ug/min via INTRAVENOUS
  Administered 2021-12-09: 20 ug/min via INTRAVENOUS

## 2021-12-09 MED ORDER — DEXAMETHASONE SODIUM PHOSPHATE 10 MG/ML IJ SOLN
INTRAMUSCULAR | Status: DC | PRN
  Administered 2021-12-09: 4 mg via INTRAVENOUS

## 2021-12-09 MED ORDER — LORAZEPAM 2 MG/ML IJ SOLN
2.0000 mg | Freq: Once | INTRAMUSCULAR | Status: DC
  Filled 2021-12-09: qty 1

## 2021-12-09 MED ORDER — SENNOSIDES-DOCUSATE SODIUM 8.6-50 MG PO TABS: 1.0000 | ORAL_TABLET | Freq: Every evening | ORAL | Status: DC | PRN

## 2021-12-09 MED ORDER — ACETAMINOPHEN 325 MG PO TABS: 650.0000 mg | ORAL_TABLET | ORAL | Status: DC | PRN

## 2021-12-09 NOTE — Transfer of Care (Signed)
Immediate Anesthesia Transfer of Care Note  Patient: Janice Curtis  Procedure(s) Performed: RADIOLOGY WITH ANESTHESIA  Patient Location: ICU  Anesthesia Type:General  Level of Consciousness: Patient remains intubated per anesthesia plan  Airway & Oxygen Therapy: Patient remains intubated per anesthesia plan and Patient placed on Ventilator (see vital sign flow sheet for setting)  Post-op Assessment: Report given to RN and Post -op Vital signs reviewed and stable  Post vital signs: Reviewed and stable  Last Vitals:  Vitals Value Taken Time  BP 126/97 12/09/21 1803  Temp    Pulse 59 12/09/21 1814  Resp 18 12/09/21 1814  SpO2 100 % 12/09/21 1814  Vitals shown include unvalidated device data.  Last Pain: There were no vitals filed for this visit.       Complications: No notable events documented.

## 2021-12-09 NOTE — Progress Notes (Signed)
Patient rectal temp 93, bair hugger applied, MD notified

## 2021-12-09 NOTE — ED Notes (Signed)
Dr. Pearline Cables is going over consent for IR with neurologist and family.

## 2021-12-09 NOTE — Anesthesia Preprocedure Evaluation (Addendum)
Anesthesia Evaluation  Patient identified by MRN, date of birth, ID bandPreop documentation limited or incomplete due to emergent nature of procedure.  Airway Mallampati: Unable to assess       Dental  (+) Upper Dentures   Pulmonary    Pulmonary exam normal        Cardiovascular hypertension, Normal cardiovascular exam     Neuro/Psych   Anxiety     CVA    GI/Hepatic   Endo/Other    Renal/GU      Musculoskeletal   Abdominal  (+) + obese  Peds  Hematology   Anesthesia Other Findings Code Stroke  Reproductive/Obstetrics                             Anesthesia Physical Anesthesia Plan  ASA: 3 and emergent  Anesthesia Plan: General   Post-op Pain Management:    Induction: Intravenous and Rapid sequence  PONV Risk Score and Plan: 3 and Ondansetron, Dexamethasone and Treatment may vary due to age or medical condition  Airway Management Planned: Oral ETT  Additional Equipment: Arterial line  Intra-op Plan:   Post-operative Plan: Possible Post-op intubation/ventilation  Informed Consent:      Only emergency history available  Plan Discussed with: CRNA  Anesthesia Plan Comments:         Anesthesia Quick Evaluation

## 2021-12-09 NOTE — Anesthesia Procedure Notes (Signed)
Procedure Name: Intubation Date/Time: 12/09/2021 4:39 PM  Performed by: Josephine Igo, CRNAPre-anesthesia Checklist: Patient identified, Suction available and Emergency Drugs available Patient Re-evaluated:Patient Re-evaluated prior to induction Oxygen Delivery Method: Circle system utilized Preoxygenation: Pre-oxygenation with 100% oxygen Induction Type: IV induction and Rapid sequence Laryngoscope Size: Charlyne Petrin and 2 Grade View: Grade I Tube type: Oral Tube size: 7.0 mm Number of attempts: 1 Airway Equipment and Method: Stylet Placement Confirmation: ETT inserted through vocal cords under direct vision, positive ETCO2 and breath sounds checked- equal and bilateral Secured at: 22 cm Tube secured with: Tape Dental Injury: Teeth and Oropharynx as per pre-operative assessment  Comments: Upper dentures remove prior to DL- to S. Frederico Hamman RN

## 2021-12-09 NOTE — Consult Note (Signed)
NAME:  BRAILEY BUESCHER, MRN:  [id number], DOB:  09/18/1940, LOS: 0 ADMISSION DATE:  12/09/2021, CONSULTATION DATE: 12/09/2021 REFERRING MD: Neurology CHIEF COMPLAINT: Status post left MCA  History of Present Illness:  81 year old female his past medical history is well-documented below was to have noted to have right-sided hemiplegia altered mental status he was evaluated by neurology and transported to [facility] for left MCA thrombectomy with neuro interventional team.  He is now status post left MCA thrombectomy on full mechanical ventilatory support, hemodynamic stable and pulmonary critical care is asked to manage her on the vent overnight goal systolic blood pressure 287/867.  Pertinent  Medical History   Past Medical History:  Diagnosis Date   Anxiety    Colon cancer ([place])    Hypercholesterolemia    Hypertension    Obesity    Stroke ([place]) 12/05/2009     Significant Hospital Events: Including procedures, antibiotic start and stop dates in addition to other pertinent events   12/09/2021 left MCA thrombectomy  Interim History / Subjective:  Status post left MCA thrombectomy sedated on the vent  Objective   Blood pressure (!) 126/97, pulse 89, resp. rate 18, height '5\' 3"'$  (1.6 m), weight 89.2 kg, SpO2 99 %.    Vent Mode: PRVC FiO2 (%):  [100 %] 100 % Set Rate:  [20 bmp] 20 bmp Vt Set:  [420 mL] 420 mL PEEP:  [5 cmH20] 5 cmH20 Plateau Pressure:  [18 cmH20] 18 cmH20  No intake or output data in the 24 hours ending 12/09/21 1813 Filed Weights   12/09/21 1437 12/09/21 1505  Weight: 86.5 kg 89.2 kg    Examination: General: Obese female sedated on ventilator HENT: Endotracheal tube in place no JVD is appreciated Lungs: Diminished in the bases Cardiovascular: Heart sounds irregular Abdomen: Abdomen soft nontender Extremities: Warm dry without edema Neuro: Currently sedated on ventilator GU: ambe urine  Resolved Hospital Problem list     Assessment & Plan:   Ventilator dependent respiratory failure in the setting of code stroke and status post thrombectomy of left middle cerebral artery on 12/09/2021.  To be left intubated overnight for possible extubation in the a.m. Modified ventilator settings as needed Wean FiO2 as needed Pulmonary toilet Serial chest x-rays  Status post MCA left thrombectomy Monitor neuro status Per neurology Goal systolic blood pressure 1 20-1 40  Anxiety Moot point while she is sedated on the ventilator     Best Practice (right click and "Reselect all SmartList Selections" daily)   Diet/type: NPO DVT prophylaxis: other GI prophylaxis: PPI Lines: N/A Foley:  N/A Code Status:  full code Last date of multidisciplinary goals of care discussion [tbd]  Labs   CBC: Recent Labs  Lab 12/09/21 1428 12/09/21 1437  WBC 5.2  --   NEUTROABS 1.8  --   HGB 12.4 13.6  HCT 38.6 40.0  MCV 81.4  --   PLT 181  --     Basic Metabolic Panel: Recent Labs  Lab 12/09/21 1428 12/09/21 1437  NA 134* 136  K 3.2* 3.2*  CL 103 102  CO2 22  --   GLUCOSE 114* 113*  BUN 12 10  CREATININE 0.90 0.90  CALCIUM 8.6*  --    GFR: Estimated Creatinine Clearance: 52.8 mL/min (by C-G formula based on SCr of 0.9 mg/dL). Recent Labs  Lab 12/09/21 1428  WBC 5.2    Liver Function Tests: Recent Labs  Lab 12/09/21 1428  AST 32  ALT 23  ALKPHOS 69  BILITOT 0.7  PROT 7.6  ALBUMIN 3.9   No results for input(s): "LIPASE", "AMYLASE" in the last 168 hours. No results for input(s): "AMMONIA" in the last 168 hours.  ABG    Component Value Date/Time   TCO2 24 12/09/2021 1437     Coagulation Profile: Recent Labs  Lab 12/09/21 1428  INR 1.0    Cardiac Enzymes: No results for input(s): "CKTOTAL", "CKMB", "CKMBINDEX", "TROPONINI" in the last 168 hours.  HbA1C: Hgb A1c MFr Bld  Date/Time Value Ref Range Status  10/20/2020 03:29 PM 6.0 (H) 4.8 - 5.6 % Final    Comment:    (NOTE) Pre diabetes:           5.7%-6.4%  Diabetes:              >6.4%  Glycemic control for   <7.0% adults with diabetes     CBG: No results for input(s): "GLUCAP" in the last 168 hours.  Review of Systems:   na  Past Medical History:  She,  has a past medical history of Anxiety, Colon cancer ([place]), Hypercholesterolemia, Hypertension, Obesity, and Stroke ([place]) (12/05/2009).   Surgical History:   Past Surgical History:  Procedure Laterality Date   COLONOSCOPY  12/27/08   simple adenoma/inflammatory polyp at anastomosis   COLONOSCOPY  04/23/2011   Procedure: COLONOSCOPY;  Surgeon: Dorothyann Peng, MD;  Location: [facility] ENDO SU[facility];  Service: Endoscopy;  Laterality: N/A;  10:00   RIGHT COLECTOMY  11/14/2007     Social History:   reports that she has never smoked. She has never used smokeless tobacco. She reports that she does not use drugs.   Family History:  Her family history includes Heart attack in her mother; Stroke in her father.   Allergies No Known Allergies   Home Medications  Prior to Admission medications   Medication Sig Start Date End Date Taking? Authorizing Provider  acetaminophen (TYLENOL) 500 MG tablet Take 500 mg by mouth every 6 (six) hours as needed. For pain    [provider]  amLODipine (NORVASC) 10 MG tablet Take 5 mg by mouth daily.  12/29/13   [provider]  aspirin EC 81 MG EC tablet Take 1 tablet (81 mg total) by mouth daily. Swallow whole. 10/22/20   Patrecia Pour, MD  Cholecalciferol (VITAMIN D3) 1000 UNITS CAPS Take 1,000 Units by mouth daily.      [provider]  clopidogrel (PLAVIX) 75 MG tablet Take 75 mg by mouth daily.    [provider]  rosuvastatin (CRESTOR) 10 MG tablet TAKE 1 TABLET BY MOUTH DAILY. PLEASE MAKE OVERDUE APPT WITH DR. ROSS 07/24/21   Fay Records, MD  verapamil (VERELAN PM) 180 MG 24 hr capsule Take 180 mg by mouth in the morning and at bedtime. 10/11/20   [provider]     Critical care time: 34 min       Richardson Landry Annisha Baar ACNP Acute Care Nurse Practitioner [place] Please consult Amion 12/09/2021, 6:13 PM

## 2021-12-09 NOTE — Consult Note (Signed)
[facility] TeleNeurology Consult Services    Date of Service:  12/09/2021     Metrics: Last Known Well: 1:00 PM Symptoms: As per HPI.  Patient is a candidate for thrombolytic.   Location of the provider: [facility]  Location of the patient: [facility] Pre-Morbid Modified Rankin Scale: 0 Time Code Stroke Page received:  2:33 PM Time neurologist arrived:  2:44 PM Time NIHSS completed: 3:00 PM    This consult was provided via telemedicine with 2-way video and audio communication. The patient/family was informed that care would be provided in this way and agreed to receive care in this manner.   ED Physician notified of diagnostic impression and management plan at: 3:43 PM   Assessment: 81 year old female presenting with acute stroke symptoms and signs referable to the left MCA territory. LKN 1:00 PM.   - Exam reveals right facial droop, plegic RUE, paretic RLE, right hemineglect, leftward eye deviation and dense receptive and expressive aphasia. NIHSS 33.  - CT head: No acute intracranial abnormality or significant interval change. Stable remote infarcts of the right corona radiata, right caudate head, and inferior left cerebellum. Stable atrophy and white matter disease. This likely reflects the sequela of chronic microvascular ischemia. Aspects is 10/10. - After comprehensive review of possible contraindications, she has no absolute contraindications to TNK administration. - Patient is an IV thrombolysis candidate. Discussed extensively the risks/benefits of IV thrombolysis treatment vs. no treatment with her daughter, including risks of hemorrhage and death with IV thrombolysis administration versus worse overall outcomes on average in patients within thrombolysis time window who are not administered TNK. The patient's aphasia precludes meaningful medical decision making on her part at this time. Overall benefits of TNK regarding long-term prognosis are  felt to outweigh risks. The patient's daughter expressed understanding and wish to proceed with TNK.  - BP was initially above threshold requiring 10 mg IV labetalol in order to lower BP prior to TNK.       Recommendations: - TNK 0.25 mg/kg administered - STAT CTA of head and neck to determine eligibility for emergent thrombectomy    Addendum: - CTA reveals left M1 occlusion - The patient is a VIR candidate. Risks/benefits of the procedure were discussed extensively with the patient's daughter, including approximately 50% chance of significant improvement relative to an approximate 10% chance of subarachnoid hemorrhage with possibility of significant worsening including death. The patient's daughter expressed understanding and provided informed consent to proceed with VIR. All questions answered.  - Discussed with Dr. Estanislado Pandy. [facility] has been called to page out a Code IR for emergent transfer to [facility] for mechanical thrombectomy.  - Dr. Rory Percy has been notified - Will need admission to 4N ICU under the Neurology service after thrombectomy with full post-TNK/post-thrombectomy order set and stroke etiology work up.  ------------------------------------------------------------------------------   History of Present Illness: The patient is an 81 year old female with a PMHx colon cancer, hypercholesterolemia, HTN, obesity and stroke who presents via EMS as a Code Stroke to the [facility] ED after she was noted by her 51 year old grandson to be aphasic with right sided paralysis after a fall at home. LKN was 1:00 PM. Yolanda Bonine, who was in a different room, heard a loud thump at 1:48 PM and when he went to her room to investigate found her down nonverbal and with right sided weakness. On EMS arrival they noted same. CBG was 112 in the field. Code Stroke was called by EMS en  route.      Past Medical History: Past Medical History:  Diagnosis Date   Anxiety    Colon cancer ([place])    Hypercholesterolemia     Hypertension    Obesity    Stroke ([place]) 12/05/2009      Past Surgical History: Past Surgical History:  Procedure Laterality Date   COLONOSCOPY  12/27/08   simple adenoma/inflammatory polyp at anastomosis   COLONOSCOPY  04/23/2011   Procedure: COLONOSCOPY;  Surgeon: Dorothyann Peng, MD;  Location: [facility];  Service: Endoscopy;  Laterality: N/A;  10:00   RIGHT COLECTOMY  11/14/2007      Medications:  No current facility-administered medications on file prior to encounter.   Current Outpatient Medications on File Prior to Encounter  Medication Sig Dispense Refill   acetaminophen (TYLENOL) 500 MG tablet Take 500 mg by mouth every 6 (six) hours as needed. For pain     amLODipine (NORVASC) 10 MG tablet Take 5 mg by mouth daily.   2   aspirin EC 81 MG EC tablet Take 1 tablet (81 mg total) by mouth daily. Swallow whole. 30 tablet 11   Cholecalciferol (VITAMIN D3) 1000 UNITS CAPS Take 1,000 Units by mouth daily.       clopidogrel (PLAVIX) 75 MG tablet Take 75 mg by mouth daily.     rosuvastatin (CRESTOR) 10 MG tablet TAKE 1 TABLET BY MOUTH DAILY. PLEASE MAKE OVERDUE APPT WITH DR. ROSS 15 tablet 0   verapamil (VERELAN PM) 180 MG 24 hr capsule Take 180 mg by mouth in the morning and at bedtime.          Social History: No EtOH No smoking   Family History:  Reviewed in Epic   ROS: As per HPI    Anticoagulant use:  None   Antiplatelet use: ASA and Plavix   Examination:    BP (!) 185/137   Pulse 73   Resp 18   Ht '5\' 3"'$  (1.6 m)   Wt 89.2 kg   SpO2 98%   BMI 34.84 kg/m     1A: Level of Consciousness - 2 1B: Ask Month and Age - 2 1C: Blink Eyes & Squeeze Hands - 2 2: Test Horizontal Extraocular Movements - 2 3: Test Visual Fields - 3 4: Test Facial Palsy (Use Grimace if Obtunded) - 2 5A: Test Left Arm Motor Drift - 2 5B: Test Right Arm Motor Drift - 4 6A: Test Left Leg Motor Drift - 2 6B: Test Right Leg Motor Drift - 3 7: Test Limb Ataxia (FNF/Heel-Shin) - 0 8:  Test Sensation -  2 9: Test Language/Aphasia - 3 10: Test Dysarthria - Severe Dysarthria: 2 11: Test Extinction/Inattention - Extinction to bilateral simultaneous stimulation 2   NIHSS Score: 12     Patient/Family was informed the Neurology Consult would occur via TeleHealth consult by way of interactive audio and video telecommunications and consented to receiving care in this manner.   Patient is being evaluated for possible acute neurologic impairment and high pretest probability of imminent or life-threatening deterioration. I spent total of 73 minutes providing care to this patient, including time for face to face visit via telemedicine, review of medical records, imaging studies and discussion of findings with providers, the patient and/or family.   Electronically signed: Dr. Kerney Elbe

## 2021-12-09 NOTE — H&P (Signed)
Neurology Consultation  Reason for Consult: Stroke transfer to [facility] for IR  Referring Physician: Dr. Cheral Marker  CC: Left M1 MCA occlusion with right-sided weakness, left gaze, and aphasia  History is obtained from: Chart review, unable to obtain from patient due to aphasia  HPI: Janice Curtis is a 81 y.o. female medical history significant for colon cancer, hypercholesterolemia, essential hypertension, obesity with a BMI of 34.84 kg/m, and remote CVA who presented to [facility] on 12/09/2021 via EMS as a transfer from [facility] ED for neuro IR after vessel imaging at [place] revealed a left M1 MCA occlusion.  Patient was in her usual state of health today at 13:00. At 13:48, patient's grandson heard a thud in another room and found Janice Curtis on the ground, nonverbal, and with right-sided weakness. On teleneurology evaluation at [place], patient was found to have findings consistent with a left MCA territory infarction.  CT head was negative for acute intercranial abnormality and vessel imaging was obtained revealing a left M1 MCA occlusion.  Patient was given TNK and was determined to be a candidate for VIR for which transfer to [facility] was initiated.   LKW: 1300 TNK given?: yes, patient was given TNK at [place] after blood pressure was stabilized.  IR Thrombectomy? Yes, patient is a candidate for VIR and was transferred to [facility] for neuro IR.  Modified Rankin Scale: 0-Completely asymptomatic and back to baseline post- stroke  ROS: Unable to obtain due to altered mental status.   Past Medical History:  Diagnosis Date   Anxiety    Colon cancer ([place])    Hypercholesterolemia    Hypertension    Obesity    Stroke ([place]) 12/05/2009   Past Surgical History:  Procedure Laterality Date   COLONOSCOPY  12/27/08   simple adenoma/inflammatory polyp at anastomosis   COLONOSCOPY  04/23/2011   Procedure: COLONOSCOPY;  Surgeon: Dorothyann Peng, MD;  Location: [facility] ENDO SUITE;  Service: Endoscopy;   Laterality: N/A;  10:00   RIGHT COLECTOMY  11/14/2007   Family History  Problem Relation Age of Onset   Heart attack Mother    Stroke Father    Social History:   reports that she has never smoked. She has never used smokeless tobacco. She reports that she does not use drugs. No history on file for alcohol use.  Medications  Current Facility-Administered Medications:    etomidate (AMIDATE) 2 MG/ML injection, , , ,    [MAR Hold] LORazepam (ATIVAN) injection 2 mg, 2 mg, Intravenous, Once, Campbell Stall P, DO   rocuronium bromide 100 MG/10ML SOSY, , , ,    succinylcholine (ANECTINE) 200 MG/10ML syringe, , , ,   Exam: Current vital signs: BP (!) 162/98   Pulse (!) 57   Resp 20   Ht '5\' 3"'$  (1.6 m)   Wt 89.2 kg   SpO2 98%   BMI 34.84 kg/m  Vital signs in last 24 hours: Pulse Rate:  [50-73] 57 (10/07 1545) Resp:  [15-22] 20 (10/07 1545) BP: (120-185)/(75-137) 162/98 (10/07 1545) SpO2:  [95 %-98 %] 98 % (10/07 1545) Weight:  [86.5 kg-89.2 kg] 89.2 kg (10/07 1505)  GENERAL: Drowsy, wakes to voice. Appears acutely ill.  Psych: Patient is calm and cooperative with examination  Head: Normocephalic and atraumatic, without obvious abnormality EENT: Normal conjunctivae, dry mucous membranes, no OP obstruction LUNGS: Normal respiratory effort. Non-labored breathing on room air CV: Regular rate and rhythm on telemetry ABDOMEN: Soft, non-tender, non-distended Extremities: Warm, well perfused, without  obvious deformity  NEURO:  Mental Status: Drowsy, wakes to voice.  She is able to follow simple commands.  She is aphasic and unable to answer orientation questions.  Right hemi-neglect is noted Cranial Nerves:  II: PERRL.  III, IV, VI: Left forced gaze deviation noted V: Does not blink to threat on the right  VII: Right facial droop noted VIII: Hearing is intact to voice IX, X: Unable to assess palate, patient does not allow for visualization  XI: Head is turned leftward XII: Does  not protrude tongue to command  Motor: Right upper and lower extremities have minimal movement without gravity, left upper and lower extremities elevate without vertical drift.  Tone is normal. Bulk is normal.  Sensation: Decreased sensation on the right  Coordination: Unable to assess FNF and HKS on the right, no obvious dysmetria on the left Gait: Deferred  NIHSS: 1a Level of Conscious.: 1 1b LOC Questions: 2 1c LOC Commands: 0 2 Best Gaze: 2 3 Visual: 2 4 Facial Palsy: 2 5a Motor Arm - left: 0 5b Motor Arm - Right: 3 6a Motor Leg - Left: 0 6b Motor Leg - Right: 3 7 Limb Ataxia: 0 8 Sensory: 2 9 Best Language: 3 10 Dysarthria: 2 11 Extinct. and Inatten.: 2 TOTAL: 24  Labs I have reviewed labs in epic and the results pertinent to this consultation are: CBC    Component Value Date/Time   WBC 5.2 12/09/2021 1428   RBC 4.74 12/09/2021 1428   HGB 13.6 12/09/2021 1437   HCT 40.0 12/09/2021 1437   PLT 181 12/09/2021 1428   MCV 81.4 12/09/2021 1428   MCH 26.2 12/09/2021 1428   MCHC 32.1 12/09/2021 1428   RDW 13.9 12/09/2021 1428   LYMPHSABS 2.8 12/09/2021 1428   MONOABS 0.4 12/09/2021 1428   EOSABS 0.2 12/09/2021 1428   BASOSABS 0.0 12/09/2021 1428   CMP     Component Value Date/Time   NA 136 12/09/2021 1437   K 3.2 (L) 12/09/2021 1437   CL 102 12/09/2021 1437   CO2 22 12/09/2021 1428   GLUCOSE 113 (H) 12/09/2021 1437   BUN 10 12/09/2021 1437   CREATININE 0.90 12/09/2021 1437   CALCIUM 8.6 (L) 12/09/2021 1428   PROT 7.6 12/09/2021 1428   ALBUMIN 3.9 12/09/2021 1428   AST 32 12/09/2021 1428   ALT 23 12/09/2021 1428   ALKPHOS 69 12/09/2021 1428   BILITOT 0.7 12/09/2021 1428   GFRNONAA >60 12/09/2021 1428   GFRAA >60 08/14/2015 1815   Lipid Panel     Component Value Date/Time   CHOL 132 10/20/2020 1529   TRIG 69 10/20/2020 1529   HDL 43 10/20/2020 1529   CHOLHDL 3.1 10/20/2020 1529   VLDL 14 10/20/2020 1529   LDLCALC 75 10/20/2020 1529   Lab Results   Component Value Date   HGBA1C 6.0 (H) 10/20/2020   Imaging I have reviewed the images obtained:  CT-scan of the brain 10/7: 1. No acute intracranial abnormality or significant interval change. 2. Stable remote infarcts of the right corona radiata, right caudate head, and inferior left cerebellum. 3. Stable atrophy and white matter disease. This likely reflects the sequela of chronic microvascular ischemia. 4. Aspects is 10/10.  CT angio head and neck 10/7: 1. Proximal left M1 occlusion with limited collaterals within the anterior aspect of the left MCA distribution. 2. Moderate stenosis of the right M1 segment. 3. Segmental narrowing within distal right MCA branch vessels without a significant proximal stenosis or occlusion.  4. Moderate stenosis of the proximal right P2 segment. 5. 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 It is occluded again at the C2-3 level. 6. High-grade stenoses of the proximal internal carotid arteries bilaterally at the carotid bifurcations. 7. Moderate stenosis at the origin of the right vertebral artery.  Assessment: 81 year old female presenting with acute stroke symptoms and signs referable to the left MCA territory. LKN 1:00 PM.   - Initial exam via teleneurology reveals right facial droop, plegic RUE, paretic RLE, right hemineglect, leftward eye deviation and dense receptive and expressive aphasia. NIHSS 33.  - Exam on arrival to [facility] reveals ongoing right facial droop, right upper and lower extremity weakness, right hemineglect, leftward eye deviation, right hemianopsia, and expressive aphasia with an NIHSS of 24.  - CT head: No acute intracranial abnormality or significant interval change. Stable remote infarcts of the right corona radiata, right caudate head, and inferior left cerebellum. Stable atrophy and white matter disease. This likely reflects the sequela of chronic microvascular ischemia. Aspects is  10/10. - CTA imaging obtained revealing proximal left M1 occlusion with limited collaterals and moderate intracranial stenosis.  - TNK administered at [facility] after blood pressure was treated and patient was transferred to [facility] for VIR.  - Stroke risk factors include patient's advanced age, history of HLD, HTN, remote CVA, and obesity with BMI of 34.84.   Plan: Acute Ischemic Stroke Cerebral infarction due to embolism of left middle cerebral artery   Acuity: Acute Current Suspected Etiology: investigation underway  Continue Evaluation:  -Admit to: ICU -Hold Aspirin until 24 hour post TNK neuroimaging is stable and without evidence of bleeding -Blood pressure control, goal per IR  -MRI/ECHO/A1C/Lipid panel -Hyperglycemia management per SSI to maintain glucose 140-'180mg'$ /dL. -PT/OT/ST therapies and recommendations when able  CNS Acute ischemic stroke s/p TNK and VIR -Close neuro monitoring  Dysarthria -NPO until cleared by speech -ST -Advance diet as tolerated  Hemiplegia and hemiparesis following cerebral infarction affecting right dominant side -PT/OT  RESP Acute Respiratory Failure  -vent management per ICU if not extubated post VIR procedure -wean when able -maintain SpO2 > 94% -supplemental oxygen as needed  CV Essential (primary) hypertension Hypertensive Emergency -Aggressive BP control, goal per IR -Titrate oral agents  Hyperlipidemia, unspecified  - Statin for goal LDL < 70  HEME AM CBC -Monitor -Transfuse for hgb < 7  Coagulopathy secondary to thrombolytic administration at OSH -Bleeding precautions  ENDO A1c pending  -SSI as needed -goal HgbA1c < 7%  GI/GU -Gentle hydration -NaCl at 75cc/h while NPO  Fluid/Electrolyte Disorders AM BMP -Replete -Repeat labs -Trend  ID Monitor fever and temperature curve - UA pending - CXR pending   Nutrition E66.9 Obesity   Prophylaxis DVT:  SCDs GI: PPI Bowel: Docusate / Senna  Diet: NPO  until cleared by speech  Code Status: Full Code   THE FOLLOWING WERE PRESENT ON ADMISSION: CNS -  Acute Ischemic Stroke, Hemiplegia Cardiovascular - Hypertensive Emergency  Heme-  Coagulopathy secondary to TNK at outside hospital  Anibal Henderson, [place] [facility] Pager: [phone]  Attending Neurohospitalist Addendum Patient seen and examined with APP/Resident. Agree with the history and physical as documented above. Agree with the plan as documented, which I helped formulate. I have independently reviewed the chart, obtained history, review of systems and examined the patient.I have personally reviewed pertinent head/neck/spine imaging (CT/MRI). Please feel free to call with any questions.  -- Amie Portland, MD Neurologist [facility]  Pager: [phone]   CRITICAL CARE ATTESTATION Performed by: Amie Portland, MD Total critical care time: 33 minutes Critical care time was exclusive of separately billable procedures and treating other patients and/or supervising APPs/Residents/Students Critical care was necessary to treat or prevent imminent or life-threatening deterioration due to AIS, EVT, IVT  This patient is critically ill and at significant risk for neurological worsening and/or death and care requires constant monitoring. Critical care was time spent personally by me on the following activities: development of treatment plan with patient and/or surrogate as well as nursing, discussions with consultants, evaluation of patient's response to treatment, examination of patient, obtaining history from patient or surrogate, ordering and performing treatments and interventions, ordering and review of laboratory studies, ordering and review of radiographic studies, pulse oximetry, re-evaluation of patient's condition, participation in multidisciplinary rounds and medical decision making of high complexity in the care of this patient.

## 2021-12-09 NOTE — ED Notes (Signed)
EMS called CODE STROKE @ 1420. Beeped out to CT and LAB 1421.

## 2021-12-09 NOTE — Consult Note (Signed)
Pt taken back to CT for CTA at 1516 per Dr Yvetta Coder request

## 2021-12-09 NOTE — Procedures (Signed)
INR. Status post left common carotid arteriogram. Right CFA approach. Findings 1.  Occluded left middle cerebral artery M1 segment. Treatment. Complete revascularization of occluded left middle cerebral artery M1 segment obtained with 1 pass with a 4 mm x 40 mm Solitaire X retrieval device and contact aspiration achieving aTICI 3 revascularization. Post procedural CT of the brain.  No evidence of intracranial hemorrhage. 8 French Angio-Seal closure device applied for hemostasis at the right groin puncture site.  Distal pulses all dopplerable unchanged from prior to the procedure. Patient left intubated due to her medical condition to protect her airway..  Also COVID test pending. Neurologically stable.  Arlean Hopping MD.

## 2021-12-09 NOTE — Progress Notes (Signed)
Patient transported from IR to 4N15 on ventilator. RN and CRNA at bedside.

## 2021-12-09 NOTE — ED Triage Notes (Signed)
Pt to the ED with complaints of having suffered an unwitnessed call at 1348 by her grandson. Pt has been nonverbal since being discovered and a LKW is unknown.  Th pt is neglecting her right side and had a CBG of 112 in the field.

## 2021-12-09 NOTE — ED Notes (Signed)
Pt in Ct  

## 2021-12-09 NOTE — Progress Notes (Signed)
Code stroke time documentation 9643 Call time 1426 Beeper time 1433 Exam started 1437 Exam finished [id number] Images sent to [place] Exam completed in [place] radiology called

## 2021-12-09 NOTE — Anesthesia Postprocedure Evaluation (Signed)
Anesthesia Post Note  Patient: Janice Curtis  Procedure(s) Performed: RADIOLOGY WITH ANESTHESIA     Patient location during evaluation: SICU Anesthesia Type: General Level of consciousness: sedated Pain management: pain level controlled Vital Signs Assessment: post-procedure vital signs reviewed and stable Respiratory status: patient remains intubated per anesthesia plan Cardiovascular status: stable Postop Assessment: no apparent nausea or vomiting Anesthetic complications: no  No notable events documented.  Last Vitals:  Vitals:   12/09/21 1930 12/09/21 2000  BP: 130/75 114/86  Pulse: 61 (!) 54  Resp: 20 20  SpO2: 100% 100%    Last Pain: There were no vitals filed for this visit.               Jarone Ostergaard P Kensley Lares

## 2021-12-09 NOTE — ED Notes (Signed)
Neuro Dr talking with family about recommendations for care .

## 2021-12-09 NOTE — Progress Notes (Signed)
Gate Progress Note Patient Name: Janice Curtis DOB: 12-01-1940 MRN: [id number]   Date of Service  12/09/2021  HPI/Events of Note  Review of post intubation CXR reveals that the transverse diameter of heart is increased. There are no signs of pulmonary edema or focal pulmonary consolidation. There is no pleural effusion or pneumothorax. Tip of endotracheal tube is in satisfactory position 2.7 cm above the carina. ABG on 40%/PRVC 20/TV 420/P 5 = 7.52/26.6/168/22.3.  eICU Interventions  Plan: Decrease PRVC rate to 14. Wean FiO2 as tolerated. Keep sat >= 92%. Repeat ABG at 12 midnight.      Intervention Category Major Interventions: Respiratory failure - evaluation and management  [name] 12/09/2021, 9:23 PM

## 2021-12-09 NOTE — Consult Note (Signed)
Stroke cart activated at 14:26 Dr Cheral Marker on screen at 14:44 MRS 0 TS paged at 14:33 TNK given at 15:11 Dose '22mg'$ , 4.40m Decision to send for intervention by Dr LCheral Markerand carelink contacted by him for transfer Pt left room for transfer at 1600

## 2021-12-09 NOTE — ED Notes (Signed)
Attempted to give report to IR at [facility] and attempted ED at [facility], neither answered the phone.

## 2021-12-09 NOTE — ED Provider Notes (Signed)
[facility] EMERGENCY DEPARTMENT Provider Note   CSN: [id number] Arrival date & time: 12/09/21  1426     History  Chief Complaint  Patient presents with   Code Stroke    Janice Curtis is a 81 y.o. female.  Patient is an 81 year old female presenting via EMS after was found down on the ground by her 75 year old grandson.  Patient's grandson reports that he heard a large thump sound from the upstairs at 1328 today.  I spoke with the grandson who states he saw his grandmother last normal 20 minutes prior to episode while she was making him breakfast.  Yolanda Bonine states he found the patient unresponsive.  EMS reports that patient was nonverbal, is deviated to the left, with weakness on the right-hand side against gravity.  EMS reports improving symptoms while they were preparing patient for arrival to ED including withdrawing from pain after an IV started on the right-hand side.  On arrival to the emergency department patient is currently unresponsive, does not follow commands, withdraws from pain on both upper and lower extremities, both eyes deviated to the left.  Opens eyes to painful stimuli only.  Does not follow commands.     Imaging review demonstrates patient had MRI brain without contrast on 10/20/2020 demonstrating small remote infarcts in the right basal ganglia and left cerebellar hemisphere there are presenting for left-sided vision loss.  Recommended for aspirin and Plavix use.  The history is provided by the patient. No language interpreter was used.       Home Medications Prior to Admission medications   Medication Sig Start Date End Date Taking? Authorizing Provider  acetaminophen (TYLENOL) 500 MG tablet Take 500 mg by mouth every 6 (six) hours as needed. For pain    [provider]  amLODipine (NORVASC) 10 MG tablet Take 5 mg by mouth daily.  12/29/13   [provider]  aspirin EC 81 MG EC tablet Take 1 tablet (81 mg total) by mouth daily. Swallow  whole. 10/22/20   Patrecia Pour, MD  Cholecalciferol (VITAMIN D3) 1000 UNITS CAPS Take 1,000 Units by mouth daily.      [provider]  clopidogrel (PLAVIX) 75 MG tablet Take 75 mg by mouth daily.    [provider]  rosuvastatin (CRESTOR) 10 MG tablet TAKE 1 TABLET BY MOUTH DAILY. PLEASE MAKE OVERDUE APPT WITH DR. ROSS 07/24/21   Fay Records, MD  verapamil (VERELAN PM) 180 MG 24 hr capsule Take 180 mg by mouth in the morning and at bedtime. 10/11/20   [provider]      Allergies    Patient has no known allergies.    Review of Systems   Review of Systems  Unable to perform ROS: Patient nonverbal    Physical Exam Updated Vital Signs BP (!) 162/98   Pulse (!) 57   Resp 20   Ht '5\' 3"'$  (1.6 m)   Wt 89.2 kg   SpO2 98%   BMI 34.84 kg/m  Physical Exam Vitals and nursing note reviewed.  HENT:     Head: Normocephalic and atraumatic.  Cardiovascular:     Rate and Rhythm: Normal rate and regular rhythm.  Pulmonary:     Effort: Pulmonary effort is normal.     Breath sounds: Normal breath sounds.  Skin:    General: Skin is warm.     Capillary Refill: Capillary refill takes less than 2 seconds.     Findings: No rash.  Neurological:  Mental Status: She is unresponsive.     GCS: GCS eye subscore is 2. GCS verbal subscore is 1. GCS motor subscore is 4.     Sensory: Sensory deficit present.     Motor: Weakness present.     Comments: Unable to perform facial cranial nerve exam due to inability of patient to cooperate during exam.  Patient currently not following commands.  Decreased sponsor the pain on the right-hand side  No motor attempt against gravity bilateral upper extremities or bilateral lower extremities     ED Results / Procedures / Treatments   Labs (all labs ordered are listed, but only abnormal results are displayed) Labs Reviewed  COMPREHENSIVE METABOLIC PANEL - Abnormal; Notable for the following components:      Result Value    Sodium 134 (*)    Potassium 3.2 (*)    Glucose, Bld 114 (*)    Calcium 8.6 (*)    All other components within normal limits  I-STAT CHEM 8, ED - Abnormal; Notable for the following components:   Potassium 3.2 (*)    Glucose, Bld 113 (*)    Calcium, Ion 1.08 (*)    All other components within normal limits  ETHANOL  PROTIME-INR  APTT  CBC  DIFFERENTIAL  RAPID URINE DRUG SCREEN, HOSP PERFORMED  URINALYSIS, ROUTINE W REFLEX MICROSCOPIC    EKG None  Radiology CT ANGIO HEAD W OR WO CONTRAST  Result Date: 12/09/2021 CLINICAL DATA:  Right arm weakness.  Left-sided gaze. EXAM: CT ANGIOGRAPHY HEAD AND NECK TECHNIQUE: Multidetector CT imaging of the head and neck was performed using the standard protocol during bolus administration of intravenous contrast. Multiplanar CT image reconstructions and MIPs were obtained to evaluate the vascular anatomy. Carotid stenosis measurements (when applicable) are obtained utilizing NASCET criteria, using the distal internal carotid diameter as the denominator. RADIATION DOSE REDUCTION: This exam was performed according to the departmental dose-optimization program which includes automated exposure control, adjustment of the mA and/or kV according to patient size and/or use of iterative reconstruction technique. CONTRAST:  32m OMNIPAQUE IOHEXOL 350 MG/ML SOLN COMPARISON:  None Available. FINDINGS: CTA NECK FINDINGS Aortic arch: Common origin of the left common carotid artery and innominate artery noted. Atherosclerotic calcifications are present in the distal arch and at the origin the innominate artery without significant stenosis or aneurysm. Right carotid system: Right common carotid artery is within normal limits. Dense calcifications are present bifurcation. High-grade, near occlusive stenosis is present at the bifurcation. Mild tortuosity is present in the cervical right ICA without an additional stenosis. Left carotid system: Left common carotid artery is  within normal limits. High-grade proximal ICA stenosis is present. The more distal cervical ICA is mildly tortuous without additional stenosis. Vertebral arteries: The right vertebral artery is the dominant vessel. Moderate stenosis is present at the origin at the right vertebral artery. The left vertebral artery is hypoplastic. It is occluded proximally with reconstitution at the C4 level. It is occluded again at the C2-3 level. Skeleton: Multilevel degenerative changes are present in the cervical spine. Vertebral body heights maintained. No focal osseous lesions are present. Other neck: Soft tissues the neck are otherwise unremarkable. Salivary glands are within normal limits. Thyroid is normal. No significant adenopathy is present. No focal mucosal or submucosal lesions are present. Upper chest: The lung apices are clear. Thoracic inlet is within normal limits. Review of the MIP images confirms the above findings CTA HEAD FINDINGS Anterior circulation: Atherosclerotic calcifications are present within the cavernous internal carotid  arteries, right greater than left without significant stenosis through the ICA termini. Moderate stenosis is present in the right M1 segment. A1 segments are normal bilaterally. A proximal left M1 occlusion is present. Limited collaterals are present anteriorly. Segmental narrowing is present within distal right MCA branch vessels without a significant proximal stenosis or occlusion. Mild irregularity is present throughout the ACA branch vessels without a significant proximal stenosis or occlusion. Posterior circulation: The left vertebral artery is occluded. The distal V4 segment is reconstituted with some flow into the left PICA, likely retrograde. The right vertebral artery is mildly irregular without a significant stenosis. The basilar artery is mildly irregular without significant stenosis. Both posterior cerebral arteries originate from basilar tip. Moderate stenosis is present  in the proximal right P2 segment. PCA branch vessels are within normal limits bilaterally. Venous sinuses: The dural sinuses are patent. The straight sinus and deep cerebral veins are intact. Cortical veins are within normal limits. No significant vascular malformation is evident. Anatomic variants: None Review of the MIP images confirms the above findings IMPRESSION: 1. Proximal left M1 occlusion with limited collaterals within the anterior aspect of the left MCA distribution. 2. Moderate stenosis of the right M1 segment. 3. Segmental narrowing within distal right MCA branch vessels without a significant proximal stenosis or occlusion. 4. Moderate stenosis of the proximal right P2 segment. 5. 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 It is occluded again at the C2-3 level. 6. High-grade stenoses of the proximal internal carotid arteries bilaterally at the carotid bifurcations. 7. Moderate stenosis at the origin of the right vertebral artery. The above was relayed via text pager to Dr. Kerney Elbe on 12/09/2021 at 15:55. Electronically Signed   By: San Morelle M.D.   On: 12/09/2021 16:07   CT ANGIO NECK W OR WO CONTRAST  Result Date: 12/09/2021 CLINICAL DATA:  Right arm weakness.  Left-sided gaze. EXAM: CT ANGIOGRAPHY HEAD AND NECK TECHNIQUE: Multidetector CT imaging of the head and neck was performed using the standard protocol during bolus administration of intravenous contrast. Multiplanar CT image reconstructions and MIPs were obtained to evaluate the vascular anatomy. Carotid stenosis measurements (when applicable) are obtained utilizing NASCET criteria, using the distal internal carotid diameter as the denominator. RADIATION DOSE REDUCTION: This exam was performed according to the departmental dose-optimization program which includes automated exposure control, adjustment of the mA and/or kV according to patient size and/or use of iterative reconstruction  technique. CONTRAST:  38m OMNIPAQUE IOHEXOL 350 MG/ML SOLN COMPARISON:  None Available. FINDINGS: CTA NECK FINDINGS Aortic arch: Common origin of the left common carotid artery and innominate artery noted. Atherosclerotic calcifications are present in the distal arch and at the origin the innominate artery without significant stenosis or aneurysm. Right carotid system: Right common carotid artery is within normal limits. Dense calcifications are present bifurcation. High-grade, near occlusive stenosis is present at the bifurcation. Mild tortuosity is present in the cervical right ICA without an additional stenosis. Left carotid system: Left common carotid artery is within normal limits. High-grade proximal ICA stenosis is present. The more distal cervical ICA is mildly tortuous without additional stenosis. Vertebral arteries: The right vertebral artery is the dominant vessel. Moderate stenosis is present at the origin at the right vertebral artery. The left vertebral artery is hypoplastic. It is occluded proximally with reconstitution at the C4 level. It is occluded again at the C2-3 level. Skeleton: Multilevel degenerative changes are present in the cervical spine. Vertebral body heights maintained.  No focal osseous lesions are present. Other neck: Soft tissues the neck are otherwise unremarkable. Salivary glands are within normal limits. Thyroid is normal. No significant adenopathy is present. No focal mucosal or submucosal lesions are present. Upper chest: The lung apices are clear. Thoracic inlet is within normal limits. Review of the MIP images confirms the above findings CTA HEAD FINDINGS Anterior circulation: Atherosclerotic calcifications are present within the cavernous internal carotid arteries, right greater than left without significant stenosis through the ICA termini. Moderate stenosis is present in the right M1 segment. A1 segments are normal bilaterally. A proximal left M1 occlusion is present.  Limited collaterals are present anteriorly. Segmental narrowing is present within distal right MCA branch vessels without a significant proximal stenosis or occlusion. Mild irregularity is present throughout the ACA branch vessels without a significant proximal stenosis or occlusion. Posterior circulation: The left vertebral artery is occluded. The distal V4 segment is reconstituted with some flow into the left PICA, likely retrograde. The right vertebral artery is mildly irregular without a significant stenosis. The basilar artery is mildly irregular without significant stenosis. Both posterior cerebral arteries originate from basilar tip. Moderate stenosis is present in the proximal right P2 segment. PCA branch vessels are within normal limits bilaterally. Venous sinuses: The dural sinuses are patent. The straight sinus and deep cerebral veins are intact. Cortical veins are within normal limits. No significant vascular malformation is evident. Anatomic variants: None Review of the MIP images confirms the above findings IMPRESSION: 1. Proximal left M1 occlusion with limited collaterals within the anterior aspect of the left MCA distribution. 2. Moderate stenosis of the right M1 segment. 3. Segmental narrowing within distal right MCA branch vessels without a significant proximal stenosis or occlusion. 4. Moderate stenosis of the proximal right P2 segment. 5. 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 It is occluded again at the C2-3 level. 6. High-grade stenoses of the proximal internal carotid arteries bilaterally at the carotid bifurcations. 7. Moderate stenosis at the origin of the right vertebral artery. The above was relayed via text pager to Dr. Kerney Elbe on 12/09/2021 at 15:55. Electronically Signed   By: San Morelle M.D.   On: 12/09/2021 16:07   CT HEAD CODE STROKE WO CONTRAST  Result Date: 12/09/2021 CLINICAL DATA:  Code stroke. Neuro deficit, acute,  stroke suspected. Right arm weakness. Left-sided gaze. EXAM: CT HEAD WITHOUT CONTRAST TECHNIQUE: Contiguous axial images were obtained from the base of the skull through the vertex without intravenous contrast. RADIATION DOSE REDUCTION: This exam was performed according to the departmental dose-optimization program which includes automated exposure control, adjustment of the mA and/or kV according to patient size and/or use of iterative reconstruction technique. COMPARISON:  MR head 10/20/2020. CT head without contrast 10/20/2020. FINDINGS: Brain: A remote infarct is again noted within the right corona radiata. A remote lacunar infarct is present in the right caudate head. Remote infarcts of the inferior left cerebellum are again noted. Mild generalized atrophy and white matter disease is otherwise stable. Basal ganglia are intact. Insular ribbon is normal bilaterally. No acute or focal cortical abnormality is present. No acute hemorrhage or mass lesion is present. Brainstem and cerebellum are otherwise within normal limits. Vascular: Atherosclerotic calcifications are present along the dural margin of the left vertebral artery. No hyperdense vessel is present. Skull: Calvarium is intact. No focal lytic or blastic lesions are present. No significant extracranial soft tissue lesion is present. Sinuses/Orbits: The paranasal sinuses and mastoid air cells  are clear. The globes and orbits are within normal limits. ASPECTS [facility] Stroke Program Early CT Score) - Ganglionic level infarction (caudate, lentiform nuclei, internal capsule, insula, M1-M3 cortex): 7/7 - Supraganglionic infarction (M4-M6 cortex): 3/3 Total score (0-10 with 10 being normal): 10/10 IMPRESSION: 1. No acute intracranial abnormality or significant interval change. 2. Stable remote infarcts of the right corona radiata, right caudate head, and inferior left cerebellum. 3. Stable atrophy and white matter disease. This likely reflects the sequela of  chronic microvascular ischemia. 4. Aspects is 10/10. These results were called by telephone at the time of interpretation on 12/09/2021 at 2:46 pm to provider Campbell Stall , who verbally acknowledged these results. Electronically Signed   By: San Morelle M.D.   On: 12/09/2021 14:49    Procedures .Critical Care  Performed by: Lianne Cure, DO Authorized by: Lianne Cure, DO   Critical care provider statement:    Critical care time (minutes):  100   Critical care was necessary to treat or prevent imminent or life-threatening deterioration of the following conditions: acute stroke.   Critical care was time spent personally by me on the following activities:  Development of treatment plan with patient or surrogate, discussions with consultants, evaluation of patient's response to treatment, examination of patient, ordering and review of laboratory studies, ordering and review of radiographic studies, ordering and performing treatments and interventions, pulse oximetry, re-evaluation of patient's condition and review of old charts   Care discussed with comment:  Neurology Comments:     Patient transferred for ED to ED to IR suite for thrombectomy for acute LVO     Medications Ordered in ED Medications  LORazepam (ATIVAN) injection 2 mg (has no administration in time range)  rocuronium bromide 100 MG/10ML SOSY (has no administration in time range)  succinylcholine (ANECTINE) 200 MG/10ML syringe (has no administration in time range)  etomidate (AMIDATE) 2 MG/ML injection (has no administration in time range)  labetalol (NORMODYNE) injection 10 mg (10 mg Intravenous Given 12/09/21 1505)  tenecteplase (TNKASE) injection for Stroke 22 mg (22 mg Intravenous Given by Other 12/09/21 1511)  iohexol (OMNIPAQUE) 350 MG/ML injection 75 mL (75 mLs Intravenous Contrast Given 12/09/21 1521)    ED Course/ Medical Decision Making/ A&P                           Medical Decision Making Amount and/or  Complexity of Data Reviewed Labs: ordered. Radiology: ordered.  Risk Prescription drug management.   81 year old female presenting via EMS after patient was heard falling and found down by her 1 year old grandson.  Patient was unresponsive verbally on EMS arrival, right-sided weakness, did not follow commands.  On arrival to ED patient sponsored to painful stimuli in bilateral upper and lower extremities.  She opens eyes to pain only.  She is unresponsive verbally.  Her eyes are deviated to the left.  No seizure-like activity.  Transfer stroke.  Stroke activated.  Patient taken directly to CT.  Stable glucose.  Head demonstrates no acute intracranial bleed.  NIH stroke scale 30.  Last known normal 1300 today.  I spoke with both the patient's son, Broadus John, and daughter, Adonis Huguenin, and explained the risk versus benefits of TNK.  The neurologist also spoke with the patient regarding the risks and benefits.  Both son and daughter state that they want all things done to help the mother recover from her current state.  The patient is not on any blood thinners, has  never had a brain hemorrhage, and does not have a history of GI bleeds.  Denies any recent surgeries or blunt head trauma in the past 3 to 4 months.  Labetalol given for blood pressure control.  Blood pressure currently above 180/111.  Patient taken to CT for CTA head and neck.  Blood pressure on arrival back 162/98.  TNKase started.  CTA head and neck demonstrates left middle cerebral artery occlusion.  Neurology has spoken with IR who will prepare for thrombectomy.  Patient accepted for ED to ED transfer.  Consent form signed by the daughter, I witnessed the signing, patient unable to sign due to aphasic nature.  Minimal improvement of logical state at this time.  Stable vitals.  Safe for transfer.        Final Clinical Impression(s) / ED Diagnoses Final diagnoses:  Cerebrovascular accident (CVA) due to occlusion of left middle  cerebral artery ([place])  Aphasia  Weakness    Rx / DC Orders ED Discharge Orders     None         Lianne Cure, DO 40/35/24 1614

## 2021-12-09 NOTE — ED Notes (Signed)
Pt began vomiting and stiffing arms, pt also noted to have urinated on herself. Staff elevated pt head and suctioned her mouth. MD aware and Ativan ordered. Neurologist requested pt not be intubated or ativan given.

## 2021-12-10 ENCOUNTER — Inpatient Hospital Stay (HOSPITAL_COMMUNITY): Payer: Medicare HMO

## 2021-12-10 DIAGNOSIS — I639 Cerebral infarction, unspecified: Secondary | ICD-10-CM | POA: Diagnosis not present

## 2021-12-10 DIAGNOSIS — I6389 Other cerebral infarction: Secondary | ICD-10-CM

## 2021-12-10 LAB — BASIC METABOLIC PANEL
Anion gap: 11 (ref 5–15)
BUN: 7 mg/dL — ABNORMAL LOW (ref 8–23)
CO2: 21 mmol/L — ABNORMAL LOW (ref 22–32)
Calcium: 8.4 mg/dL — ABNORMAL LOW (ref 8.9–10.3)
Chloride: 104 mmol/L (ref 98–111)
Creatinine, Ser: 0.89 mg/dL (ref 0.44–1.00)
GFR, Estimated: >60 mL/min (ref >60)
Glucose, Bld: 93 mg/dL (ref 70–99)
Potassium: 3.9 mmol/L (ref 3.5–5.1)
Sodium: 136 mmol/L (ref 135–145)

## 2021-12-10 LAB — LIPID PANEL
Cholesterol: 196 mg/dL (ref 0–200)
HDL: 37 mg/dL — ABNORMAL LOW (ref >40)
LDL Cholesterol: 131 mg/dL — ABNORMAL HIGH (ref 0–99)
Total CHOL/HDL Ratio: 5.3 RATIO
Triglycerides: 138 mg/dL (ref <150)
VLDL: 28 mg/dL (ref 0–40)

## 2021-12-10 LAB — ECHOCARDIOGRAM COMPLETE
Area-P 1/2: 3.9 cm2
Calc EF: 58.4 %
Height: 63 in
S' Lateral: 2.7 cm
Single Plane A2C EF: 58.8 %
Single Plane A4C EF: 57.6 %
Weight: 3146.41 oz

## 2021-12-10 LAB — CBC WITH DIFFERENTIAL/PLATELET
Abs Immature Granulocytes: 0.02 10*3/uL (ref 0.00–0.07)
Basophils Absolute: 0 10*3/uL (ref 0.0–0.1)
Basophils Relative: 1 %
Eosinophils Absolute: 0 10*3/uL (ref 0.0–0.5)
Eosinophils Relative: 1 %
HCT: 33.3 % — ABNORMAL LOW (ref 36.0–46.0)
Hemoglobin: 10.9 g/dL — ABNORMAL LOW (ref 12.0–15.0)
Immature Granulocytes: 0 %
Lymphocytes Relative: 30 %
Lymphs Abs: 1.9 10*3/uL (ref 0.7–4.0)
MCH: 26.7 pg (ref 26.0–34.0)
MCHC: 32.7 g/dL (ref 30.0–36.0)
MCV: 81.6 fL (ref 80.0–100.0)
Monocytes Absolute: 0.5 10*3/uL (ref 0.1–1.0)
Monocytes Relative: 7 %
Neutro Abs: 4.1 10*3/uL (ref 1.7–7.7)
Neutrophils Relative %: 61 %
Platelets: 179 10*3/uL (ref 150–400)
RBC: 4.08 MIL/uL (ref 3.87–5.11)
RDW: 14 % (ref 11.5–15.5)
WBC: 6.5 10*3/uL (ref 4.0–10.5)
nRBC: 0 % (ref 0.0–0.2)

## 2021-12-10 LAB — TRIGLYCERIDES: Triglycerides: 137 mg/dL (ref <150)

## 2021-12-10 MED ORDER — PERFLUTREN LIPID MICROSPHERE
1.0000 mL | INTRAVENOUS | Status: AC | PRN
  Administered 2021-12-10: 2 mL via INTRAVENOUS

## 2021-12-10 MED ORDER — ROSUVASTATIN CALCIUM 20 MG PO TABS
40.0000 mg | ORAL_TABLET | Freq: Every day | ORAL | Status: DC
  Administered 2021-12-10 – 2021-12-15 (×4): 40 mg
  Filled 2021-12-10 (×4): qty 2

## 2021-12-10 MED ORDER — ORAL CARE MOUTH RINSE: 15.0000 mL | OROMUCOSAL | Status: DC | PRN

## 2021-12-10 NOTE — Progress Notes (Signed)
An USGPIV (ultrasound guided PIV) has been placed for short-term vasopressor infusion. A correctly placed ivWatch must be used when administering Vasopressors. Should this treatment be needed beyond 72 hours, central line access should be obtained.  It will be the responsibility of the bedside nurse to follow best practice to prevent extravasations.  HS Brindley Madarang RN 

## 2021-12-10 NOTE — Progress Notes (Addendum)
STROKE TEAM PROGRESS NOTE   INTERVAL HISTORY No family at the bedside. Pateint is intubated. On neo gtt @ 10 and prop @ 25. Plan is to extubate today  ?Afib on montior. Will obtain EKG. EKG with A fib- ?new onset  Disconjugate gaze, Pupils equal and reactive. Does not follow commands.  Purposeful movement on left, right arm less movement, withdraws in bilaterally lowers MRI at scheduled for 1400  Vitals:   12/10/21 0734 12/10/21 0740 12/10/21 0800 12/10/21 0830  BP: (!) 95/46 136/61 (!) 165/75 (!) 144/71  Pulse:   (!) 59   Resp:   20   Temp:   98.7 F (37.1 C)   TempSrc:   Axillary   SpO2:   100%   Weight:      Height:       CBC:  Recent Labs  Lab 12/09/21 1428 12/09/21 1437 12/09/21 2327 12/10/21 0539  WBC 5.2  --   --  6.5  NEUTROABS 1.8  --   --  4.1  HGB 12.4   < > 11.2* 10.9*  HCT 38.6   < > 33.0* 33.3*  MCV 81.4  --   --  81.6  PLT 181  --   --  179   < > = values in this interval not displayed.   Basic Metabolic Panel:  Recent Labs  Lab 12/09/21 1428 12/09/21 1437 12/09/21 2111 12/09/21 2327 12/10/21 0539  NA 134* 136   < > 135 136  K 3.2* 3.2*   < > 3.7 3.9  CL 103 102  --   --  104  CO2 22  --   --   --  21*  GLUCOSE 114* 113*  --   --  93  BUN 12 10  --   --  7*  CREATININE 0.90 0.90  --   --  0.89  CALCIUM 8.6*  --   --   --  8.4*   < > = values in this interval not displayed.   Lipid Panel:  Recent Labs  Lab 12/10/21 0539  CHOL 196  TRIG 138  137  HDL 37*  CHOLHDL 5.3  VLDL 28  LDLCALC 131*   HgbA1c:  Recent Labs  Lab 12/09/21 1909  HGBA1C 5.7*   Urine Drug Screen: No results for input(s): "LABOPIA", "COCAINSCRNUR", "LABBENZ", "AMPHETMU", "THCU", "LABBARB" in the last 168 hours.  Alcohol Level  Recent Labs  Lab 12/09/21 1428  ETH <10    IMAGING past 24 hours DG CHEST PORT 1 VIEW  Result Date: 12/09/2021 CLINICAL DATA:  Acute respiratory failure EXAM: PORTABLE CHEST 1 VIEW COMPARISON:  Previous studies including the  examination of 10/02/2017 FINDINGS: Transverse diameter of heart is increased. There are no signs of pulmonary edema or focal pulmonary consolidation. There is no pleural effusion or pneumothorax. Tip of endotracheal tube is 2.7 cm above the carina. Enteric tube is noted traversing the esophagus. IMPRESSION: There are no signs of pulmonary edema or focal pulmonary consolidation. Electronically Signed   By: Elmer Picker M.D.   On: 12/09/2021 20:06   DG Abd Portable 1V  Result Date: 12/09/2021 CLINICAL DATA:  Evaluate location of enteric tube EXAM: PORTABLE ABDOMEN - 1 VIEW COMPARISON:  None Available. FINDINGS: Tip of enteric tube is seen in the region of distal antrum/pylorus. There is contrast in pelvocaliceal systems in both kidneys residual from previous intravenous contrast administration. IMPRESSION: Tip of enteric tube is seen in the region of distal antrum/pylorus of the stomach. Electronically  Signed   By: Elmer Picker M.D.   On: 12/09/2021 20:05   CT ANGIO HEAD W OR WO CONTRAST  Result Date: 12/09/2021 CLINICAL DATA:  Right arm weakness.  Left-sided gaze. EXAM: CT ANGIOGRAPHY HEAD AND NECK TECHNIQUE: Multidetector CT imaging of the head and neck was performed using the standard protocol during bolus administration of intravenous contrast. Multiplanar CT image reconstructions and MIPs were obtained to evaluate the vascular anatomy. Carotid stenosis measurements (when applicable) are obtained utilizing NASCET criteria, using the distal internal carotid diameter as the denominator. RADIATION DOSE REDUCTION: This exam was performed according to the departmental dose-optimization program which includes automated exposure control, adjustment of the mA and/or kV according to patient size and/or use of iterative reconstruction technique. CONTRAST:  72m OMNIPAQUE IOHEXOL 350 MG/ML SOLN COMPARISON:  None Available. FINDINGS: CTA NECK FINDINGS Aortic arch: Common origin of the left common  carotid artery and innominate artery noted. Atherosclerotic calcifications are present in the distal arch and at the origin the innominate artery without significant stenosis or aneurysm. Right carotid system: Right common carotid artery is within normal limits. Dense calcifications are present bifurcation. High-grade, near occlusive stenosis is present at the bifurcation. Mild tortuosity is present in the cervical right ICA without an additional stenosis. Left carotid system: Left common carotid artery is within normal limits. High-grade proximal ICA stenosis is present. The more distal cervical ICA is mildly tortuous without additional stenosis. Vertebral arteries: The right vertebral artery is the dominant vessel. Moderate stenosis is present at the origin at the right vertebral artery. The left vertebral artery is hypoplastic. It is occluded proximally with reconstitution at the C4 level. It is occluded again at the C2-3 level. Skeleton: Multilevel degenerative changes are present in the cervical spine. Vertebral body heights maintained. No focal osseous lesions are present. Other neck: Soft tissues the neck are otherwise unremarkable. Salivary glands are within normal limits. Thyroid is normal. No significant adenopathy is present. No focal mucosal or submucosal lesions are present. Upper chest: The lung apices are clear. Thoracic inlet is within normal limits. Review of the MIP images confirms the above findings CTA HEAD FINDINGS Anterior circulation: Atherosclerotic calcifications are present within the cavernous internal carotid arteries, right greater than left without significant stenosis through the ICA termini. Moderate stenosis is present in the right M1 segment. A1 segments are normal bilaterally. A proximal left M1 occlusion is present. Limited collaterals are present anteriorly. Segmental narrowing is present within distal right MCA branch vessels without a significant proximal stenosis or occlusion.  Mild irregularity is present throughout the ACA branch vessels without a significant proximal stenosis or occlusion. Posterior circulation: The left vertebral artery is occluded. The distal V4 segment is reconstituted with some flow into the left PICA, likely retrograde. The right vertebral artery is mildly irregular without a significant stenosis. The basilar artery is mildly irregular without significant stenosis. Both posterior cerebral arteries originate from basilar tip. Moderate stenosis is present in the proximal right P2 segment. PCA branch vessels are within normal limits bilaterally. Venous sinuses: The dural sinuses are patent. The straight sinus and deep cerebral veins are intact. Cortical veins are within normal limits. No significant vascular malformation is evident. Anatomic variants: None Review of the MIP images confirms the above findings IMPRESSION: 1. Proximal left M1 occlusion with limited collaterals within the anterior aspect of the left MCA distribution. 2. Moderate stenosis of the right M1 segment. 3. Segmental narrowing within distal right MCA branch vessels without a significant proximal stenosis or  occlusion. 4. Moderate stenosis of the proximal right P2 segment. 5. 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 It is occluded again at the C2-3 level. 6. High-grade stenoses of the proximal internal carotid arteries bilaterally at the carotid bifurcations. 7. Moderate stenosis at the origin of the right vertebral artery. The above was relayed via text pager to Dr. Kerney Elbe on 12/09/2021 at 15:55. Electronically Signed   By: San Morelle M.D.   On: 12/09/2021 16:07   CT ANGIO NECK W OR WO CONTRAST  Result Date: 12/09/2021 CLINICAL DATA:  Right arm weakness.  Left-sided gaze. EXAM: CT ANGIOGRAPHY HEAD AND NECK TECHNIQUE: Multidetector CT imaging of the head and neck was performed using the standard protocol during bolus administration of  intravenous contrast. Multiplanar CT image reconstructions and MIPs were obtained to evaluate the vascular anatomy. Carotid stenosis measurements (when applicable) are obtained utilizing NASCET criteria, using the distal internal carotid diameter as the denominator. RADIATION DOSE REDUCTION: This exam was performed according to the departmental dose-optimization program which includes automated exposure control, adjustment of the mA and/or kV according to patient size and/or use of iterative reconstruction technique. CONTRAST:  2m OMNIPAQUE IOHEXOL 350 MG/ML SOLN COMPARISON:  None Available. FINDINGS: CTA NECK FINDINGS Aortic arch: Common origin of the left common carotid artery and innominate artery noted. Atherosclerotic calcifications are present in the distal arch and at the origin the innominate artery without significant stenosis or aneurysm. Right carotid system: Right common carotid artery is within normal limits. Dense calcifications are present bifurcation. High-grade, near occlusive stenosis is present at the bifurcation. Mild tortuosity is present in the cervical right ICA without an additional stenosis. Left carotid system: Left common carotid artery is within normal limits. High-grade proximal ICA stenosis is present. The more distal cervical ICA is mildly tortuous without additional stenosis. Vertebral arteries: The right vertebral artery is the dominant vessel. Moderate stenosis is present at the origin at the right vertebral artery. The left vertebral artery is hypoplastic. It is occluded proximally with reconstitution at the C4 level. It is occluded again at the C2-3 level. Skeleton: Multilevel degenerative changes are present in the cervical spine. Vertebral body heights maintained. No focal osseous lesions are present. Other neck: Soft tissues the neck are otherwise unremarkable. Salivary glands are within normal limits. Thyroid is normal. No significant adenopathy is present. No focal mucosal  or submucosal lesions are present. Upper chest: The lung apices are clear. Thoracic inlet is within normal limits. Review of the MIP images confirms the above findings CTA HEAD FINDINGS Anterior circulation: Atherosclerotic calcifications are present within the cavernous internal carotid arteries, right greater than left without significant stenosis through the ICA termini. Moderate stenosis is present in the right M1 segment. A1 segments are normal bilaterally. A proximal left M1 occlusion is present. Limited collaterals are present anteriorly. Segmental narrowing is present within distal right MCA branch vessels without a significant proximal stenosis or occlusion. Mild irregularity is present throughout the ACA branch vessels without a significant proximal stenosis or occlusion. Posterior circulation: The left vertebral artery is occluded. The distal V4 segment is reconstituted with some flow into the left PICA, likely retrograde. The right vertebral artery is mildly irregular without a significant stenosis. The basilar artery is mildly irregular without significant stenosis. Both posterior cerebral arteries originate from basilar tip. Moderate stenosis is present in the proximal right P2 segment. PCA branch vessels are within normal limits bilaterally. Venous sinuses: The dural sinuses are patent. The  straight sinus and deep cerebral veins are intact. Cortical veins are within normal limits. No significant vascular malformation is evident. Anatomic variants: None Review of the MIP images confirms the above findings IMPRESSION: 1. Proximal left M1 occlusion with limited collaterals within the anterior aspect of the left MCA distribution. 2. Moderate stenosis of the right M1 segment. 3. Segmental narrowing within distal right MCA branch vessels without a significant proximal stenosis or occlusion. 4. Moderate stenosis of the proximal right P2 segment. 5. Occlusion of the proximal left vertebral artery with  reconstitution at the C4 level. It is occluded again at the C2-3 level. 6. High-grade stenoses of the proximal internal carotid arteries bilaterally at the carotid bifurcations. 7. Moderate stenosis at the origin of the right vertebral artery. The above was relayed via text pager to Dr. Kerney Elbe on 12/09/2021 at 15:55. Electronically Signed   By: San Morelle M.D.   On: 12/09/2021 16:07   CT HEAD CODE STROKE WO CONTRAST  Result Date: 12/09/2021 CLINICAL DATA:  Code stroke. Neuro deficit, acute, stroke suspected. Right arm weakness. Left-sided gaze. EXAM: CT HEAD WITHOUT CONTRAST TECHNIQUE: Contiguous axial images were obtained from the base of the skull through the vertex without intravenous contrast. RADIATION DOSE REDUCTION: This exam was performed according to the departmental dose-optimization program which includes automated exposure control, adjustment of the mA and/or kV according to patient size and/or use of iterative reconstruction technique. COMPARISON:  MR head 10/20/2020. CT head without contrast 10/20/2020. FINDINGS: Brain: A remote infarct is again noted within the right corona radiata. A remote lacunar infarct is present in the right caudate head. Remote infarcts of the inferior left cerebellum are again noted. Mild generalized atrophy and white matter disease is otherwise stable. Basal ganglia are intact. Insular ribbon is normal bilaterally. No acute or focal cortical abnormality is present. No acute hemorrhage or mass lesion is present. Brainstem and cerebellum are otherwise within normal limits. Vascular: Atherosclerotic calcifications are present along the dural margin of the left vertebral artery. No hyperdense vessel is present. Skull: Calvarium is intact. No focal lytic or blastic lesions are present. No significant extracranial soft tissue lesion is present. Sinuses/Orbits: The paranasal sinuses and mastoid air cells are clear. The globes and orbits are within normal limits.  ASPECTS [facility] Stroke Program Early CT Score) - Ganglionic level infarction (caudate, lentiform nuclei, internal capsule, insula, M1-M3 cortex): 7/7 - Supraganglionic infarction (M4-M6 cortex): 3/3 Total score (0-10 with 10 being normal): 10/10 IMPRESSION: 1. No acute intracranial abnormality or significant interval change. 2. Stable remote infarcts of the right corona radiata, right caudate head, and inferior left cerebellum. 3. Stable atrophy and white matter disease. This likely reflects the sequela of chronic microvascular ischemia. 4. Aspects is 10/10. These results were called by telephone at the time of interpretation on 12/09/2021 at 2:46 pm to provider Campbell Stall , who verbally acknowledged these results. Electronically Signed   By: San Morelle M.D.   On: 12/09/2021 14:49    PHYSICAL EXAM  Temp:  [93 F (33.9 C)-98.7 F (37.1 C)] 98.7 F (37.1 C) (10/08 0800) Pulse Rate:  [50-89] 62 (10/08 1100) Resp:  [14-27] 18 (10/08 1100) BP: (64-185)/(44-137) 117/54 (10/08 1100) SpO2:  [94 %-100 %] 100 % (10/08 1100) FiO2 (%):  [30 %-100 %] 30 % (10/08 0723) Weight:  [86.5 kg-89.2 kg] 89.2 kg (10/07 1505)  General - Critically ill elderly female, intubated Well nourished, well developed, in no apparent distress.  Cardiovascular - irregular- ? A fib  Mental Status -  Prop paused during exam.  Disconjugate gaze, Pupils equal and reactive. Does not follow commands.  Purposeful movement on left, right arm less movement, withdraws in bilaterally lowers  Motor Strength - left arm and leg able to move antigravity, right leg withdraws to pain, right arm flickers  Motor Tone - Muscle tone was assessed at the neck and appendages and was normal.  Sensory - decreased on right arm    Coordination - unable to assess   Gait and Station - deferred.   ASSESSMENT/PLAN Janice Curtis is a 81 y.o. female with history of  colon cancer, hypercholesterolemia, essential hypertension, obesity  with a BMI of 34.84 kg/m, and remote CVA who presented to [facility] on 12/09/2021 via EMS as a transfer from [facility] ED for neuro IR after vessel imaging at [facility] revealed a left M1 MCA occlusion.  Patient was in her usual state of health today at 13:00. Patient was given TNK and was determined to be a candidate for VIR for which transfer to [facility] was initiated.   Stroke:  Acute Left MCA ischemic infarct s/p TNK; S/p revascularization of Left M1 occlusion with TICI 3 Etiology:  likely due to new onset A fib   Code Stroke CT head  1. No acute intracranial abnormality or significant interval change. 2. Stable remote infarcts of the right corona radiata, right caudate head, and inferior left cerebellum. 3. Stable atrophy and white matter disease. This likely reflects the sequela of chronic microvascular ischemia. 4. Aspects is 10/10. CTA head & neck  1. Proximal left M1 occlusion with limited collaterals within the anterior aspect of the left MCA distribution. 2. Moderate stenosis of the right M1 segment. 3. Segmental narrowing within distal right MCA branch vessels without a significant proximal stenosis or occlusion. 4. Moderate stenosis of the proximal right P2 segment. 5. 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 It is occluded again at the C2-3 level. 6. High-grade stenoses of the proximal internal carotid arteries bilaterally at the carotid bifurcations. 7. Moderate stenosis at the origin of the right vertebral artery Cerebral angio  Occluded left middle cerebral artery M1 segment Post IR CT No evidence of intracranial hemorrhage. MRI  pending 2D Echo EF 60-65%. moderate left ventricular hypertrophy of the basal-septal segment. No shunt EKG with A fib  LDL 131 HgbA1c 5.7 VTE prophylaxis - SCD's    Diet   Diet NPO time specified   aspirin 81 mg daily and clopidogrel 75 mg daily prior to admission, now on No antithrombotic. Will start  aspirin  if no hemorrhage on 24 hr post TNK brain imaging. Will need to consider starting [facility]  Therapy recommendations:  pending  Disposition:  pending   Hypertension Hypotension  Home meds:  amlodipine, verapamil  UnStable Was on Neo gtt @ 57mg now off Long-term BP goal normotensive  Hyperlipidemia Home meds:  crestor '10mg'$ , increased to '40mg'$ , resumed in hospital LDL 131, goal < 70 High intensity statin not indicated  Continue statin at discharge   New onset A fib EKG with A fib Cardiology consult  [facility]   Acute respiratory Failure, resolved Management per CCM   Dysphagia  NPO Has OG tube  Speech therapy to evaluate   Other Stroke Risk Factors Advanced Age >/= 611 Obesity, Body mass index is 34.84 kg/m., BMI >/= 30 associated with increased stroke risk, recommend weight loss, diet and exercise as appropriate  Hx stroke/TIA   Other Active Problems Anxiety  Anemia Hgb stable 10.9  Hospital day # 1  Beulah Gandy DNP, ACNPC-AG   STROKE MD NOTE : Janice Curtis have personally obtained history,examined this patient, reviewed notes, independently viewed imaging studies, participated in medical decision making and plan of care.ROS completed by me personally and pertinent positives fully documented  I have made any additions or clarifications directly to the above note. Agree with note above.  Patient presented with aphasia and right hemiparesis due to left M1 occlusion likely from new onset A-fib and was treated with IV thrombolysis with TNK followed by successful mechanical thrombectomy with TICI 3 revascularization.  She remains intubated and aphasic with right hemiparesis.  Recommend close neurological observation with strict blood pressure control with systolic goal between 371-062 for the first 24 hours.  Check MRI scan of the brain later today and consider extubation as tolerated per critical care team.  Start aspirin if no hemorrhage on the MRI and  will need to transition to anticoagulation when she is able to swallow the next few days.  Continue ongoing stroke work-up.  No family available at the bedside for discussion.  Discussed with Dr. Silas Flood critical care medicine.This patient is critically ill and at significant risk of neurological worsening, death and care requires constant monitoring of vital signs, hemodynamics,respiratory and cardiac monitoring, extensive review of multiple databases, frequent neurological assessment, discussion with family, other specialists and medical decision making of high complexity.I have made any additions or clarifications directly to the above note.This critical care time does not reflect procedure time, or teaching time or supervisory time of PA/NP/Med Resident etc but could involve care discussion time.  I spent 35 minutes of neurocritical care time  in the care of  this patient.      Antony Contras, MD Medical Director [facility] Pager: [phone] 12/10/2021 3:28 PM  To contact Stroke Continuity provider, please refer to [email or web address] After hours, contact General Neurology

## 2021-12-10 NOTE — Progress Notes (Signed)
OT Cancellation Note  Patient Details Name: Janice Curtis MRN: [id number] DOB: 02-25-1941   Cancelled Treatment:    Reason Eval/Treat Not Completed: Active bedrest order (Will return as schedule allows.)  [place], OTR/L Acute Rehab Office: [phone] 12/10/2021, 7:15 AM

## 2021-12-10 NOTE — Progress Notes (Signed)
NAME:  Janice Curtis, MRN:  [id number], DOB:  1940/05/30, LOS: 1 ADMISSION DATE:  12/09/2021, CONSULTATION DATE: 12/09/2021 REFERRING MD: Neurology CHIEF COMPLAINT: Status post left MCA  History of Present Illness:  81 year old female his past medical history is well-documented below was to have noted to have right-sided hemiplegia altered mental status he was evaluated by neurology and transported to [facility] for left MCA thrombectomy with neuro interventional team.  He is now status post left MCA thrombectomy on full mechanical ventilatory support, hemodynamic stable and pulmonary critical care is asked to manage her on the vent overnight goal systolic blood pressure 330/076.  Pertinent  Medical History   Past Medical History:  Diagnosis Date   Anxiety    Colon cancer ([place])    Hypercholesterolemia    Hypertension    Obesity    Stroke ([place]) 12/05/2009     Significant Hospital Events: Including procedures, antibiotic start and stop dates in addition to other pertinent events   12/09/2021 left MCA thrombectomy  Interim History / Subjective:  NAEON. Weaning well, agiatated. No cuff leak, Plan to go forward with extubation  Objective   Blood pressure (!) 108/50, pulse 68, temperature 98.7 F (37.1 C), temperature source Axillary, resp. rate 19, height '5\' 3"'$  (1.6 m), weight 89.2 kg, SpO2 98 %.    Vent Mode: PSV;CPAP FiO2 (%):  [30 %-100 %] 30 % Set Rate:  [14 bmp-20 bmp] 14 bmp Vt Set:  [420 mL] 420 mL PEEP:  [5 cmH20] 5 cmH20 Pressure Support:  [5 cmH20] 5 cmH20 Plateau Pressure:  [15 cmH20-18 cmH20] 16 cmH20   Intake/Output Summary (Last 24 hours) at 12/10/2021 1019 Last data filed at 12/10/2021 0800 Gross per 24 hour  Intake 1959.69 ml  Output 995 ml  Net 964.69 ml   Filed Weights   12/09/21 1437 12/09/21 1505  Weight: 86.5 kg 89.2 kg    Examination: General: Obese female sedated on ventilator HENT: Endotracheal tube in place no JVD is appreciated Lungs:  Diminished in the bases Cardiovascular: Heart sounds regular Abdomen: Abdomen soft nontender Extremities: Warm dry without edema Neuro: Currently sedated on ventilator GU: ambe urine  Resolved Hospital Problem list     Assessment & Plan:  Ventilator dependent respiratory failure in the setting of code stroke and status post thrombectomy of left middle cerebral artery on 12/09/2021.   Passed SBT, plan to extubated Pulmonary toilet  Status post MCA left thrombectomy and TNK Monitor neuro status Per neurology Goal systolic blood pressure 226-3 40 Antiplts per Neuro  Anxiety Resume meds as able once extubated  Hyponatremia: Resolved  CVA: Resume ASA/plavix as able per neuro  HLD: Statin   Best Practice (right click and "Reselect all SmartList Selections" daily)   Diet/type: NPO DVT prophylaxis: other GI prophylaxis: PPI Lines: N/A Foley:  N/A Code Status:  full code Last date of multidisciplinary goals of care discussion [tbd]  Labs   CBC: Recent Labs  Lab 12/09/21 1428 12/09/21 1437 12/09/21 2111 12/09/21 2327 12/10/21 0539  WBC 5.2  --   --   --  6.5  NEUTROABS 1.8  --   --   --  4.1  HGB 12.4 13.6 11.2* 11.2* 10.9*  HCT 38.6 40.0 33.0* 33.0* 33.3*  MCV 81.4  --   --   --  81.6  PLT 181  --   --   --  179     Basic Metabolic Panel: Recent Labs  Lab 12/09/21 1428 12/09/21 1437 12/09/21 2111 12/09/21  2327 12/10/21 0539  NA 134* 136 133* 135 136  K 3.2* 3.2* 3.4* 3.7 3.9  CL 103 102  --   --  104  CO2 22  --   --   --  21*  GLUCOSE 114* 113*  --   --  93  BUN 12 10  --   --  7*  CREATININE 0.90 0.90  --   --  0.89  CALCIUM 8.6*  --   --   --  8.4*    GFR: Estimated Creatinine Clearance: 53.4 mL/min (by C-G formula based on SCr of 0.89 mg/dL). Recent Labs  Lab 12/09/21 1428 12/10/21 0539  WBC 5.2 6.5     Liver Function Tests: Recent Labs  Lab 12/09/21 1428  AST 32  ALT 23  ALKPHOS 69  BILITOT 0.7  PROT 7.6  ALBUMIN 3.9     No results for input(s): "LIPASE", "AMYLASE" in the last 168 hours. No results for input(s): "AMMONIA" in the last 168 hours.  ABG    Component Value Date/Time   PHART 7.351 12/09/2021 2327   PCO2ART 41.0 12/09/2021 2327   PO2ART 186 (H) 12/09/2021 2327   HCO3 22.9 12/09/2021 2327   TCO2 24 12/09/2021 2327   ACIDBASEDEF 3.0 (H) 12/09/2021 2327   O2SAT 100 12/09/2021 2327     Coagulation Profile: Recent Labs  Lab 12/09/21 1428  INR 1.0     Cardiac Enzymes: No results for input(s): "CKTOTAL", "CKMB", "CKMBINDEX", "TROPONINI" in the last 168 hours.  HbA1C: Hgb A1c MFr Bld  Date/Time Value Ref Range Status  12/09/2021 07:09 PM 5.7 (H) 4.8 - 5.6 % Final    Comment:    (NOTE) Pre diabetes:          5.7%-6.4%  Diabetes:              >6.4%  Glycemic control for   <7.0% adults with diabetes   10/20/2020 03:29 PM 6.0 (H) 4.8 - 5.6 % Final    Comment:    (NOTE) Pre diabetes:          5.7%-6.4%  Diabetes:              >6.4%  Glycemic control for   <7.0% adults with diabetes     CBG: No results for input(s): "GLUCAP" in the last 168 hours.  Review of Systems:   na  Past Medical History:  She,  has a past medical history of Anxiety, Colon cancer ([place]), Hypercholesterolemia, Hypertension, Obesity, and Stroke ([place]) (12/05/2009).   Surgical History:   Past Surgical History:  Procedure Laterality Date   COLONOSCOPY  12/27/08   simple adenoma/inflammatory polyp at anastomosis   COLONOSCOPY  04/23/2011   Procedure: COLONOSCOPY;  Surgeon: Dorothyann Peng, MD;  Location: [facility];  Service: Endoscopy;  Laterality: N/A;  10:00   RIGHT COLECTOMY  11/14/2007     Social History:   reports that she has never smoked. She has never used smokeless tobacco. She reports that she does not use drugs.   Family History:  Her family history includes Heart attack in her mother; Stroke in her father.   Allergies No Known Allergies   Home Medications  Prior to  Admission medications   Medication Sig Start Date End Date Taking? Authorizing Provider  acetaminophen (TYLENOL) 500 MG tablet Take 500 mg by mouth every 6 (six) hours as needed. For pain    [provider]  amLODipine (NORVASC) 10 MG tablet Take 5 mg by mouth  daily.  12/29/13   [provider]  aspirin EC 81 MG EC tablet Take 1 tablet (81 mg total) by mouth daily. Swallow whole. 10/22/20   Patrecia Pour, MD  Cholecalciferol (VITAMIN D3) 1000 UNITS CAPS Take 1,000 Units by mouth daily.      [provider]  clopidogrel (PLAVIX) 75 MG tablet Take 75 mg by mouth daily.    [provider]  rosuvastatin (CRESTOR) 10 MG tablet TAKE 1 TABLET BY MOUTH DAILY. PLEASE MAKE OVERDUE APPT WITH DR. ROSS 07/24/21   Fay Records, MD  verapamil (VERELAN PM) 180 MG 24 hr capsule Take 180 mg by mouth in the morning and at bedtime. 10/11/20   [provider]     Critical care time:     CRITICAL CARE Performed by: Lanier Clam   Total critical care time: 32 minutes  Critical care time was exclusive of separately billable procedures and treating other patients.  Critical care was necessary to treat or prevent imminent or life-threatening deterioration.  Critical care was time spent personally by me on the following activities: development of treatment plan with patient and/or surrogate as well as nursing, discussions with consultants, evaluation of patient's response to treatment, examination of patient, obtaining history from patient or surrogate, ordering and performing treatments and interventions, ordering and review of laboratory studies, ordering and review of radiographic studies, pulse oximetry and re-evaluation of patient's condition.   Lanier Clam, MD [facility] Please consult Amion 12/10/2021, 10:19 AM

## 2021-12-10 NOTE — Evaluation (Signed)
Physical Therapy Evaluation Patient Details Name: Janice Curtis MRN: [id number] DOB: 04-29-1940 Today's Date: 12/10/2021  History of Present Illness  81 y.o. female admitted on 12/09/21 forR sided weakness, aphasia.  Dx with L M1 MCA stroke s/p TNK and IR for thrombectomy.  Intubated for IR procedure on 10/7 and extubated 10/8.  Pt with significant PMH of colon CA s/p colectomy, HTN, obesity, stroke (2011).  Clinical Impression  Pt with R sided weakness, however, able to move right arm and leg to command.  Pt with R inattention, still left gaze preference, but will come past midline for cues.  She is able to support herself in sitting for ~10 mins with improved O2 saturations.  She would benefit from CIR level therapies prior to returning home.   PT to follow acutely for deficits listed below.        Recommendations for follow up therapy are one component of a multi-disciplinary discharge planning process, led by the attending physician.  Recommendations may be updated based on patient status, additional functional criteria and insurance authorization.  Follow Up Recommendations Acute inpatient rehab (3hours/day)      Assistance Recommended at Discharge Frequent or constant Supervision/Assistance  Patient can return home with the following  A lot of help with walking and/or transfers;A lot of help with bathing/dressing/bathroom;Assistance with cooking/housework;Assistance with feeding;Direct supervision/assist for financial management;Direct supervision/assist for medications management;Assist for transportation;Help with stairs or ramp for entrance    Equipment Recommendations Other (comment) (TBD as pt progresses and family is here to report equipment already owned.)  Recommendations for Other Services  Rehab consult    Functional Status Assessment Patient has had a recent decline in their functional status and demonstrates the ability to make significant improvements in function in a  reasonable and predictable amount of time.     Precautions / Restrictions Precautions Precautions: Fall Precaution Comments: right sided weakness/inattention      Mobility  Bed Mobility Overal bed mobility: Needs Assistance Bed Mobility: Supine to Sit, Sit to Supine     Supine to sit: Mod assist, HOB elevated Sit to supine: Mod assist, HOB elevated   General bed mobility comments: Mod assist to help at trunk and legs to get to sitting EOB.  Mod assist to help with both legs and control trunk to return to supine.    Transfers                   General transfer comment: Did not attempt without a second person d/t pt impulsive and very short statured for tall ICU bed    Ambulation/Gait                  Stairs            Wheelchair Mobility    Modified Rankin (Stroke Patients Only) Modified Rankin (Stroke Patients Only) Pre-Morbid Rankin Score: No symptoms Modified Rankin: Moderately severe disability     Balance Overall balance assessment: Needs assistance Sitting-balance support: No upper extremity supported, Feet unsupported Sitting balance-Leahy Scale: Fair Sitting balance - Comments: pt got up to close supervision EOB, has a R lateral lean/push tendency Postural control: Right lateral lean                                   Pertinent Vitals/Pain Pain Assessment Pain Assessment: No/denies pain    Home Living Family/patient expects to be discharged to:: Private residence  Additional Comments: pt with expressive difficulties and inconsistent with yes/no answers.  Will need family to confirm PLOF and home living situation.  No family present at PT eval.    Prior Function                       Hand Dominance        Extremity/Trunk Assessment   Upper Extremity Assessment Upper Extremity Assessment: Defer to OT evaluation    Lower Extremity Assessment Lower Extremity Assessment: RLE  deficits/detail;LLE deficits/detail RLE Deficits / Details: right leg weaker than left leg.  Grossly 3-/5 throughout ankle, knee, hip per bed and EOB gross assessments. RLE Sensation:  (difficult to asses due to impaired communication and R inattention)    Cervical / Trunk Assessment Cervical / Trunk Assessment: Normal  Communication   Communication: Expressive difficulties  Cognition Arousal/Alertness: Awake/alert Behavior During Therapy: Restless, Impulsive Overall Cognitive Status: Difficult to assess                                          General Comments General comments (skin integrity, edema, etc.): O2 improved in fully upright sitting to 100% on 3L O2 [place].  HR 70-80    Exercises     Assessment/Plan    PT Assessment Patient needs continued PT services  PT Problem List Decreased strength;Decreased activity tolerance;Decreased balance;Decreased mobility;Decreased knowledge of use of DME;Decreased cognition;Cardiopulmonary status limiting activity;Impaired sensation;Obesity       PT Treatment Interventions DME instruction;Gait training;Stair training;Functional mobility training;Therapeutic activities;Therapeutic exercise;Balance training;Cognitive remediation;Patient/family education;Neuromuscular re-education    PT Goals (Current goals can be found in the Care Plan section)  Acute Rehab PT Goals Patient Stated Goal: unable to state PT Goal Formulation: Patient unable to participate in goal setting Time For Goal Achievement: 12/24/21 Potential to Achieve Goals: Good    Frequency Min 4X/week     Co-evaluation               AM-PAC PT "6 Clicks" Mobility  Outcome Measure Help needed turning from your back to your side while in a flat bed without using bedrails?: A Lot Help needed moving from lying on your back to sitting on the side of a flat bed without using bedrails?: A Lot Help needed moving to and from a bed to a chair (including a  wheelchair)?: Total Help needed standing up from a chair using your arms (e.g., wheelchair or bedside chair)?: Total Help needed to walk in hospital room?: Total Help needed climbing 3-5 steps with a railing? : Total 6 Click Score: 8    End of Session Equipment Utilized During Treatment: Oxygen Activity Tolerance: Patient tolerated treatment well Patient left: in bed;with call bell/phone within reach;with bed alarm set Nurse Communication: Mobility status PT Visit Diagnosis: Muscle weakness (generalized) (M62.81);Difficulty in walking, not elsewhere classified (R26.2);Hemiplegia and hemiparesis Hemiplegia - Right/Left: Right Hemiplegia - dominant/non-dominant: Dominant Hemiplegia - caused by: Cerebral infarction    Time: 1537-1600 PT Time Calculation (min) (ACUTE ONLY): 23 min   Charges:   PT Evaluation $PT Eval Moderate Complexity: 1 Mod PT Treatments $Therapeutic Activity: 8-22 mins       Verdene Lennert, PT, DPT  Acute Rehabilitation Secure chat is best for contact #[phone] office    12/10/2021, 4:14 PM

## 2021-12-10 NOTE — Progress Notes (Signed)
Supervising Physician: Luanne Bras  Patient Status:  [facility] - In-pt  Chief Complaint:  Code Stroke 1 day s/p Complete revascularization of occluded left middle cerebral artery M1 segment   Subjective:  Examined bedside with RN Patient intubated, awakens spontaneously during exam. To extubate this morning. Reported purposeful left sided movement.  Minimal movement of right side per RN  Allergies: Patient has no known allergies.  Medications: Prior to Admission medications   Medication Sig Start Date End Date Taking? Authorizing Provider  acetaminophen (TYLENOL) 500 MG tablet Take 500 mg by mouth every 6 (six) hours as needed. For pain    [provider]  amLODipine (NORVASC) 10 MG tablet Take 5 mg by mouth daily.  12/29/13   [provider]  aspirin EC 81 MG EC tablet Take 1 tablet (81 mg total) by mouth daily. Swallow whole. 10/22/20   Patrecia Pour, MD  Cholecalciferol (VITAMIN D3) 1000 UNITS CAPS Take 1,000 Units by mouth daily.      [provider]  clopidogrel (PLAVIX) 75 MG tablet Take 75 mg by mouth daily.    [provider]  rosuvastatin (CRESTOR) 10 MG tablet TAKE 1 TABLET BY MOUTH DAILY. PLEASE MAKE OVERDUE APPT WITH DR. ROSS 07/24/21   Fay Records, MD  verapamil (VERELAN PM) 180 MG 24 hr capsule Take 180 mg by mouth in the morning and at bedtime. 10/11/20   [provider]     Vital Signs: BP 129/73 (BP Location: Right Arm)   Pulse 64   Temp 99 F (37.2 C) (Axillary)   Resp (!) 23   Ht '5\' 3"'$  (1.6 m)   Wt 196 lb 10.4 oz (89.2 kg)   SpO2 98%   BMI 34.84 kg/m   Physical Exam Vitals reviewed.  Constitutional:      Interventions: She is sedated and intubated.  Cardiovascular:     Rate and Rhythm: Normal rate.     Pulses: Normal pulses.     Comments: R CFA access site soft, not bleeding, no hematoma/pseudoaneurysm Pulmonary:     Effort: She is intubated.  Skin:    General: Skin is warm and dry.   Neurological:     Comments: Pupils equal Follows commands Unable to move right toes or grip with right hand Reflex present in right foot     Imaging: DG CHEST PORT 1 VIEW  Result Date: 12/09/2021 CLINICAL DATA:  Acute respiratory failure EXAM: PORTABLE CHEST 1 VIEW COMPARISON:  Previous studies including the examination of 10/02/2017 FINDINGS: Transverse diameter of heart is increased. There are no signs of pulmonary edema or focal pulmonary consolidation. There is no pleural effusion or pneumothorax. Tip of endotracheal tube is 2.7 cm above the carina. Enteric tube is noted traversing the esophagus. IMPRESSION: There are no signs of pulmonary edema or focal pulmonary consolidation. Electronically Signed   By: Elmer Picker M.D.   On: 12/09/2021 20:06   DG Abd Portable 1V  Result Date: 12/09/2021 CLINICAL DATA:  Evaluate location of enteric tube EXAM: PORTABLE ABDOMEN - 1 VIEW COMPARISON:  None Available. FINDINGS: Tip of enteric tube is seen in the region of distal antrum/pylorus. There is contrast in pelvocaliceal systems in both kidneys residual from previous intravenous contrast administration. IMPRESSION: Tip of enteric tube is seen in the region of distal antrum/pylorus of the stomach. Electronically Signed   By: Elmer Picker M.D.   On: 12/09/2021 20:05   CT ANGIO HEAD W OR WO CONTRAST  Result Date: 12/09/2021  CLINICAL DATA:  Right arm weakness.  Left-sided gaze. EXAM: CT ANGIOGRAPHY HEAD AND NECK TECHNIQUE: Multidetector CT imaging of the head and neck was performed using the standard protocol during bolus administration of intravenous contrast. Multiplanar CT image reconstructions and MIPs were obtained to evaluate the vascular anatomy. Carotid stenosis measurements (when applicable) are obtained utilizing NASCET criteria, using the distal internal carotid diameter as the denominator. RADIATION DOSE REDUCTION: This exam was performed according to the departmental  dose-optimization program which includes automated exposure control, adjustment of the mA and/or kV according to patient size and/or use of iterative reconstruction technique. CONTRAST:  65m OMNIPAQUE IOHEXOL 350 MG/ML SOLN COMPARISON:  None Available. FINDINGS: CTA NECK FINDINGS Aortic arch: Common origin of the left common carotid artery and innominate artery noted. Atherosclerotic calcifications are present in the distal arch and at the origin the innominate artery without significant stenosis or aneurysm. Right carotid system: Right common carotid artery is within normal limits. Dense calcifications are present bifurcation. High-grade, near occlusive stenosis is present at the bifurcation. Mild tortuosity is present in the cervical right ICA without an additional stenosis. Left carotid system: Left common carotid artery is within normal limits. High-grade proximal ICA stenosis is present. The more distal cervical ICA is mildly tortuous without additional stenosis. Vertebral arteries: The right vertebral artery is the dominant vessel. Moderate stenosis is present at the origin at the right vertebral artery. The left vertebral artery is hypoplastic. It is occluded proximally with reconstitution at the C4 level. It is occluded again at the C2-3 level. Skeleton: Multilevel degenerative changes are present in the cervical spine. Vertebral body heights maintained. No focal osseous lesions are present. Other neck: Soft tissues the neck are otherwise unremarkable. Salivary glands are within normal limits. Thyroid is normal. No significant adenopathy is present. No focal mucosal or submucosal lesions are present. Upper chest: The lung apices are clear. Thoracic inlet is within normal limits. Review of the MIP images confirms the above findings CTA HEAD FINDINGS Anterior circulation: Atherosclerotic calcifications are present within the cavernous internal carotid arteries, right greater than left without significant  stenosis through the ICA termini. Moderate stenosis is present in the right M1 segment. A1 segments are normal bilaterally. A proximal left M1 occlusion is present. Limited collaterals are present anteriorly. Segmental narrowing is present within distal right MCA branch vessels without a significant proximal stenosis or occlusion. Mild irregularity is present throughout the ACA branch vessels without a significant proximal stenosis or occlusion. Posterior circulation: The left vertebral artery is occluded. The distal V4 segment is reconstituted with some flow into the left PICA, likely retrograde. The right vertebral artery is mildly irregular without a significant stenosis. The basilar artery is mildly irregular without significant stenosis. Both posterior cerebral arteries originate from basilar tip. Moderate stenosis is present in the proximal right P2 segment. PCA branch vessels are within normal limits bilaterally. Venous sinuses: The dural sinuses are patent. The straight sinus and deep cerebral veins are intact. Cortical veins are within normal limits. No significant vascular malformation is evident. Anatomic variants: None Review of the MIP images confirms the above findings IMPRESSION: 1. Proximal left M1 occlusion with limited collaterals within the anterior aspect of the left MCA distribution. 2. Moderate stenosis of the right M1 segment. 3. Segmental narrowing within distal right MCA branch vessels without a significant proximal stenosis or occlusion. 4. Moderate stenosis of the proximal right P2 segment. 5. 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 It is  occluded again at the C2-3 level. 6. High-grade stenoses of the proximal internal carotid arteries bilaterally at the carotid bifurcations. 7. Moderate stenosis at the origin of the right vertebral artery. The above was relayed via text pager to Dr. Kerney Elbe on 12/09/2021 at 15:55. Electronically Signed    By: San Morelle M.D.   On: 12/09/2021 16:07   CT ANGIO NECK W OR WO CONTRAST  Result Date: 12/09/2021 CLINICAL DATA:  Right arm weakness.  Left-sided gaze. EXAM: CT ANGIOGRAPHY HEAD AND NECK TECHNIQUE: Multidetector CT imaging of the head and neck was performed using the standard protocol during bolus administration of intravenous contrast. Multiplanar CT image reconstructions and MIPs were obtained to evaluate the vascular anatomy. Carotid stenosis measurements (when applicable) are obtained utilizing NASCET criteria, using the distal internal carotid diameter as the denominator. RADIATION DOSE REDUCTION: This exam was performed according to the departmental dose-optimization program which includes automated exposure control, adjustment of the mA and/or kV according to patient size and/or use of iterative reconstruction technique. CONTRAST:  10m OMNIPAQUE IOHEXOL 350 MG/ML SOLN COMPARISON:  None Available. FINDINGS: CTA NECK FINDINGS Aortic arch: Common origin of the left common carotid artery and innominate artery noted. Atherosclerotic calcifications are present in the distal arch and at the origin the innominate artery without significant stenosis or aneurysm. Right carotid system: Right common carotid artery is within normal limits. Dense calcifications are present bifurcation. High-grade, near occlusive stenosis is present at the bifurcation. Mild tortuosity is present in the cervical right ICA without an additional stenosis. Left carotid system: Left common carotid artery is within normal limits. High-grade proximal ICA stenosis is present. The more distal cervical ICA is mildly tortuous without additional stenosis. Vertebral arteries: The right vertebral artery is the dominant vessel. Moderate stenosis is present at the origin at the right vertebral artery. The left vertebral artery is hypoplastic. It is occluded proximally with reconstitution at the C4 level. It is occluded again at the C2-3  level. Skeleton: Multilevel degenerative changes are present in the cervical spine. Vertebral body heights maintained. No focal osseous lesions are present. Other neck: Soft tissues the neck are otherwise unremarkable. Salivary glands are within normal limits. Thyroid is normal. No significant adenopathy is present. No focal mucosal or submucosal lesions are present. Upper chest: The lung apices are clear. Thoracic inlet is within normal limits. Review of the MIP images confirms the above findings CTA HEAD FINDINGS Anterior circulation: Atherosclerotic calcifications are present within the cavernous internal carotid arteries, right greater than left without significant stenosis through the ICA termini. Moderate stenosis is present in the right M1 segment. A1 segments are normal bilaterally. A proximal left M1 occlusion is present. Limited collaterals are present anteriorly. Segmental narrowing is present within distal right MCA branch vessels without a significant proximal stenosis or occlusion. Mild irregularity is present throughout the ACA branch vessels without a significant proximal stenosis or occlusion. Posterior circulation: The left vertebral artery is occluded. The distal V4 segment is reconstituted with some flow into the left PICA, likely retrograde. The right vertebral artery is mildly irregular without a significant stenosis. The basilar artery is mildly irregular without significant stenosis. Both posterior cerebral arteries originate from basilar tip. Moderate stenosis is present in the proximal right P2 segment. PCA branch vessels are within normal limits bilaterally. Venous sinuses: The dural sinuses are patent. The straight sinus and deep cerebral veins are intact. Cortical veins are within normal limits. No significant vascular malformation is evident. Anatomic variants: None Review of the  MIP images confirms the above findings IMPRESSION: 1. Proximal left M1 occlusion with limited collaterals  within the anterior aspect of the left MCA distribution. 2. Moderate stenosis of the right M1 segment. 3. Segmental narrowing within distal right MCA branch vessels without a significant proximal stenosis or occlusion. 4. Moderate stenosis of the proximal right P2 segment. 5. 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 It is occluded again at the C2-3 level. 6. High-grade stenoses of the proximal internal carotid arteries bilaterally at the carotid bifurcations. 7. Moderate stenosis at the origin of the right vertebral artery. The above was relayed via text pager to Dr. Kerney Elbe on 12/09/2021 at 15:55. Electronically Signed   By: San Morelle M.D.   On: 12/09/2021 16:07   CT HEAD CODE STROKE WO CONTRAST  Result Date: 12/09/2021 CLINICAL DATA:  Code stroke. Neuro deficit, acute, stroke suspected. Right arm weakness. Left-sided gaze. EXAM: CT HEAD WITHOUT CONTRAST TECHNIQUE: Contiguous axial images were obtained from the base of the skull through the vertex without intravenous contrast. RADIATION DOSE REDUCTION: This exam was performed according to the departmental dose-optimization program which includes automated exposure control, adjustment of the mA and/or kV according to patient size and/or use of iterative reconstruction technique. COMPARISON:  MR head 10/20/2020. CT head without contrast 10/20/2020. FINDINGS: Brain: A remote infarct is again noted within the right corona radiata. A remote lacunar infarct is present in the right caudate head. Remote infarcts of the inferior left cerebellum are again noted. Mild generalized atrophy and white matter disease is otherwise stable. Basal ganglia are intact. Insular ribbon is normal bilaterally. No acute or focal cortical abnormality is present. No acute hemorrhage or mass lesion is present. Brainstem and cerebellum are otherwise within normal limits. Vascular: Atherosclerotic calcifications are present along the  dural margin of the left vertebral artery. No hyperdense vessel is present. Skull: Calvarium is intact. No focal lytic or blastic lesions are present. No significant extracranial soft tissue lesion is present. Sinuses/Orbits: The paranasal sinuses and mastoid air cells are clear. The globes and orbits are within normal limits. ASPECTS [facility] Stroke Program Early CT Score) - Ganglionic level infarction (caudate, lentiform nuclei, internal capsule, insula, M1-M3 cortex): 7/7 - Supraganglionic infarction (M4-M6 cortex): 3/3 Total score (0-10 with 10 being normal): 10/10 IMPRESSION: 1. No acute intracranial abnormality or significant interval change. 2. Stable remote infarcts of the right corona radiata, right caudate head, and inferior left cerebellum. 3. Stable atrophy and white matter disease. This likely reflects the sequela of chronic microvascular ischemia. 4. Aspects is 10/10. These results were called by telephone at the time of interpretation on 12/09/2021 at 2:46 pm to provider Campbell Stall , who verbally acknowledged these results. Electronically Signed   By: San Morelle M.D.   On: 12/09/2021 14:49    Labs:  CBC: Recent Labs    12/09/21 1428 12/09/21 1437 12/09/21 2111 12/09/21 2327 12/10/21 0539  WBC 5.2  --   --   --  6.5  HGB 12.4 13.6 11.2* 11.2* 10.9*  HCT 38.6 40.0 33.0* 33.0* 33.3*  PLT 181  --   --   --  179    COAGS: Recent Labs    12/09/21 1428  INR 1.0  APTT 27    BMP: Recent Labs    12/09/21 1428 12/09/21 1437 12/09/21 2111 12/09/21 2327 12/10/21 0539  NA 134* 136 133* 135 136  K 3.2* 3.2* 3.4* 3.7 3.9  CL 103 102  --   --  104  CO2 22  --   --   --  21*  GLUCOSE 114* 113*  --   --  93  BUN 12 10  --   --  7*  CALCIUM 8.6*  --   --   --  8.4*  CREATININE 0.90 0.90  --   --  0.89  GFRNONAA >60  --   --   --  >60    LIVER FUNCTION TESTS: Recent Labs    12/09/21 1428  BILITOT 0.7  AST 32  ALT 23  ALKPHOS 69  PROT 7.6  ALBUMIN 3.9     Assessment and Plan:  S/P revascularization of occluded left middle cerebral artery M1 segment  --still intubated --following commands --minimal if any purposeful movement of the right side  Will defer further recommendations to stroke team. IR to follow.   Electronically Signed: Pasty Spillers, PA 12/10/2021, 12:19 PM   I spent a total of 25 Minutes at the the patient's bedside AND on the patient's hospital floor or unit, greater than 50% of which was counseling/coordinating care for stroke intervention

## 2021-12-10 NOTE — Progress Notes (Signed)
Inpatient Rehab Admissions Coordinator Note:   Per PT patient was screened for CIR candidacy by Daphane Odekirk Danford Bad, CCC-SLP. At this time, pt appears to be a potential candidate for CIR. I will place an order for rehab consult for full assessment, per our protocol.  Please contact me any with questions.   Gayland Curry, [place], [place] Admissions Coordinator [phone] 12/10/21 5:10 PM

## 2021-12-10 NOTE — Progress Notes (Signed)
MD Leonel Ramsay contacted by this RN as patient has not received aspirin after 24 hour post TNK MRI was completed. Patient has failed swallow screen, however rectal aspirin may be given. Due to bleeding that was revealed on the imaging, verbal orders given to hold off on aspirin administration until tomorrow.   Verbal order from MD Leonel Ramsay that BP parameters may also be loosened to SBP <150 as patient is post-24 hours from procedure.   Care ongoing.  Wyn Quaker, RN

## 2021-12-10 NOTE — Progress Notes (Signed)
  Echocardiogram 2D Echocardiogram has been performed.  Darlina Sicilian M 12/10/2021, 12:12 PM

## 2021-12-10 NOTE — Procedures (Signed)
Extubation Procedure Note  Patient Details:   Name: Janice Curtis DOB: [birth year] MRN: [id number]   Airway Documentation:    Vent end date: 12/10/21 Vent end time: 1040   Evaluation  O2 sats: stable throughout Complications: No apparent complications Patient did tolerate procedure well. Bilateral Breath Sounds: Clear, Diminished   No Patient extubated to 4L [place]. Clear bilateral breath sounds. No respiratory distress noted.   Patsy [place] Shedrick Sarli 12/10/2021, 10:46 AM

## 2021-12-10 NOTE — Progress Notes (Signed)
SLP Cancellation Note  Patient Details Name: ANGELIK WALLS MRN: [id number] DOB: 1940-07-25   Cancelled treatment:       Reason Eval/Treat Not Completed: Patient not medically ready Pt currently on the vent. SLP will follow up.   Aveah Castell I. Hardin Negus, [place], [place] Office number [phone]  Horton Marshall 12/10/2021, 8:06 AM

## 2021-12-11 ENCOUNTER — Encounter (HOSPITAL_COMMUNITY): Payer: Self-pay | Admitting: Radiology

## 2021-12-11 DIAGNOSIS — I4891 Unspecified atrial fibrillation: Secondary | ICD-10-CM

## 2021-12-11 DIAGNOSIS — E876 Hypokalemia: Secondary | ICD-10-CM

## 2021-12-11 DIAGNOSIS — I639 Cerebral infarction, unspecified: Secondary | ICD-10-CM | POA: Diagnosis not present

## 2021-12-11 LAB — CBG MONITORING, ED: Glucose-Capillary: 108 mg/dL — ABNORMAL HIGH (ref 70–99)

## 2021-12-11 MED ORDER — METOPROLOL TARTRATE 25 MG PO TABS
25.0000 mg | ORAL_TABLET | Freq: Two times a day (BID) | ORAL | Status: DC
  Administered 2021-12-11 – 2021-12-15 (×7): 25 mg via ORAL
  Filled 2021-12-11 (×7): qty 1

## 2021-12-11 NOTE — Consult Note (Signed)
Cardiology Consultation:  Patient ID: IVELIZ GARAY MRN: [id number]; DOB: Aug 26, 1940  Admit date: 12/09/2021 Date of Consult: 12/11/2021  Primary Care Provider: Arnold Line, [facility] Primary Cardiologist: None  Primary Electrophysiologist:  None   Patient Profile:  Janice Curtis is a 81 y.o. female with a hx of hypertension, obesity, hyperlipidemia who is being seen today for the evaluation of new onset atrial fibrillation at the request of Antony Contras, MD.  History of Present Illness:  Ms. Satchell presents with right-sided hemiplegia and altered mental status.  She was found to have a left MCA stroke status post thrombectomy.  She was weaned from the ventilator and transferred to the regular floor.  She was noted to be in new onset atrial fibrillation.  Seems to be rate controlled per review of records.  Brain imaging shows multiple areas of lacunar infarcts.  She also has chronic microvascular ischemia.  Echocardiogram with normal LV function and no significant wall motion abnormality.  At the time my examination she reports no symptoms of chest pain or trouble breathing.  She is unaware of her atrial fibrillation.  She has never carried this diagnosis before.  She tells me she does not smoke.  No alcohol or drug use.  Does have hypertension as well as hyperlipidemia.  Stroke is mention but she informs me she is unaware of a prior stroke.  Heart Pathway Score:       Past Medical History: Past Medical History:  Diagnosis Date   Anxiety    Colon cancer ([place])    Hypercholesterolemia    Hypertension    Obesity    Stroke ([place]) 12/05/2009    Past Surgical History: Past Surgical History:  Procedure Laterality Date   COLONOSCOPY  12/27/08   simple adenoma/inflammatory polyp at anastomosis   COLONOSCOPY  04/23/2011   Procedure: COLONOSCOPY;  Surgeon: Dorothyann Peng, MD;  Location: [facility];  Service: Endoscopy;  Laterality: N/A;  10:00   RADIOLOGY WITH ANESTHESIA N/A  12/09/2021   Procedure: RADIOLOGY WITH ANESTHESIA;  Surgeon: Radiologist, Medication, MD;  Location: [place];  Service: Radiology;  Laterality: N/A;   RIGHT COLECTOMY  11/14/2007       Inpatient Medications: Scheduled Meds:  Chlorhexidine Gluconate Cloth  6 each Topical Q0600   pantoprazole (PROTONIX) IV  40 mg Intravenous QHS   rosuvastatin  40 mg Per Tube Daily   Continuous Infusions:  PRN Meds: acetaminophen **OR** acetaminophen (TYLENOL) oral liquid 160 mg/5 mL **OR** acetaminophen, mouth rinse, senna-docusate  Allergies:    No Known Allergies  Social History:   Social History   Socioeconomic History   Marital status: Divorced    Spouse name: Not on file   Number of children: Not on file   Years of education: Not on file   Highest education level: Not on file  Occupational History   Not on file  Tobacco Use   Smoking status: Never   Smokeless tobacco: Never  Vaping Use   Vaping Use: Unknown  Substance and Sexual Activity   Alcohol use: Not on file   Drug use: No   Sexual activity: Yes  Other Topics Concern   Not on file  Social History Narrative   RAISES HER GRANDSON SINCE HE WAS 2 YO. HE IS 6 YO NOW.   Social Determinants of Health   Financial Resource Strain: Not on file  Food Insecurity: Not on file  Transportation Needs: Not on file  Physical Activity: Not on file  Stress: Not on  file  Social Connections: Not on file  Intimate Partner Violence: Not on file     Family History:    Family History  Problem Relation Age of Onset   Heart attack Mother    Stroke Father      ROS:  All other ROS reviewed and negative. Pertinent positives noted in the HPI.     Physical Exam/Data:   Vitals:   12/11/21 1200 12/11/21 1300 12/11/21 1400 12/11/21 1508  BP: (!) 147/80 (!) 146/79 (!) 134/115 129/78  Pulse: 80 78 77 78  Resp: (!) 21 (!) 22 (!) 27 20  Temp:    98.2 F (36.8 C)  TempSrc:    Oral  SpO2: 96% 97% (!) 61% 100%  Weight:      Height:         Intake/Output Summary (Last 24 hours) at 12/11/2021 1553 Last data filed at 12/11/2021 0400 Gross per 24 hour  Intake --  Output 375 ml  Net -375 ml       12/09/2021    3:05 PM 12/09/2021    2:37 PM 10/20/2020    6:39 PM  Last 3 Weights  Weight (lbs) 196 lb 10.4 oz 190 lb 11.2 oz 190 lb 11.2 oz  Weight (kg) 89.2 kg 86.5 kg 86.5 kg    Body mass index is 34.84 kg/m.  General: Well nourished, well developed, in no acute distress Head: Atraumatic, normal size  Eyes: PEERLA, EOMI  Neck: Supple, no JVD Endocrine: No thryomegaly Cardiac: Normal S1, S2; irregular rhythm, murmurs Lungs: Clear to auscultation bilaterally, no wheezing, rhonchi or rales  Abd: Soft, nontender, no hepatomegaly  Ext: No edema, pulses 2+ Musculoskeletal: No deformities, BUE and BLE strength normal and equal Skin: Warm and dry, no rashes   Neuro: Alert and oriented to person, place, time, and situation, CNII-XII grossly intact, no focal deficits  Psych: Normal mood and affect   EKG:  The EKG was personally reviewed and demonstrates:  Afib 65 bpm, LAFB Telemetry:  Telemetry was personally reviewed and demonstrates: Atrial fibrillation heart rate 70s  Relevant CV Studies: TTE 12/10/2021  1. Left ventricular ejection fraction, by estimation, is 60 to 65%. The  left ventricle has normal function. The left ventricle has no regional  wall motion abnormalities. There is moderate left ventricular hypertrophy  of the basal-septal segment. Left  ventricular diastolic parameters are indeterminate.   2. Right ventricular systolic function is normal. The right ventricular  size is normal. There is normal pulmonary artery systolic pressure. The  estimated right ventricular systolic pressure is 29.9 mmHg.   3. The mitral valve is normal in structure. No evidence of mitral valve  regurgitation. No evidence of mitral stenosis.   4. Tricuspid valve regurgitation is moderate.   5. The aortic valve is tricuspid. Aortic valve  regurgitation is mild. No  aortic stenosis is present.   6. Pulmonic valve regurgitation is moderate.   7. The inferior vena cava is normal in size with greater than 50%  respiratory variability, suggesting right atrial pressure of 3 mmHg.   MRI 12/10/2021 IMPRESSION: 1. Acute/subacute nonhemorrhagic infarcts involving the left lentiform nucleus and caudate. 2. Acute/subacute nonhemorrhagic infarct involving the posterior left insular cortex. 3. The remainder of the left MCA territory is spared. 4. Subarachnoid hemorrhage in the left parietal lobe. 5. Stable atrophy and white matter disease elsewhere likely reflects the sequela of chronic microvascular ischemia.  Laboratory Data: High Sensitivity Troponin:   Recent Labs  Lab 12/09/21 1909  TROPONINIHS  7     Cardiac EnzymesNo results for input(s): "TROPONINI" in the last 168 hours. No results for input(s): "TROPIPOC" in the last 168 hours.  Chemistry Recent Labs  Lab 12/09/21 1428 12/09/21 1437 12/09/21 2111 12/09/21 2327 12/10/21 0539  NA 134* 136 133* 135 136  K 3.2* 3.2* 3.4* 3.7 3.9  CL 103 102  --   --  104  CO2 22  --   --   --  21*  GLUCOSE 114* 113*  --   --  93  BUN 12 10  --   --  7*  CREATININE 0.90 0.90  --   --  0.89  CALCIUM 8.6*  --   --   --  8.4*  GFRNONAA >60  --   --   --  >60  ANIONGAP 9  --   --   --  11    Recent Labs  Lab 12/09/21 1428  PROT 7.6  ALBUMIN 3.9  AST 32  ALT 23  ALKPHOS 69  BILITOT 0.7   Hematology Recent Labs  Lab 12/09/21 1428 12/09/21 1437 12/09/21 2111 12/09/21 2327 12/10/21 0539  WBC 5.2  --   --   --  6.5  RBC 4.74  --   --   --  4.08  HGB 12.4   < > 11.2* 11.2* 10.9*  HCT 38.6   < > 33.0* 33.0* 33.3*  MCV 81.4  --   --   --  81.6  MCH 26.2  --   --   --  26.7  MCHC 32.1  --   --   --  32.7  RDW 13.9  --   --   --  14.0  PLT 181  --   --   --  179   < > = values in this interval not displayed.   BNPNo results for input(s): "BNP", "PROBNP" in the last 168  hours.  DDimer No results for input(s): "DDIMER" in the last 168 hours.  Radiology/Studies:  MR BRAIN WO CONTRAST  Result Date: 12/10/2021 CLINICAL DATA:  Stroke, follow-up.  Status post TNK. EXAM: MRI HEAD WITHOUT CONTRAST TECHNIQUE: Multiplanar, multiecho pulse sequences of the brain and surrounding structures were obtained without intravenous contrast. COMPARISON:  CT head and CTA head and neck 12/09/2021. MR head 10/20/2020 FINDINGS: Brain: Diffusion-weighted images demonstrate acute/subacute nonhemorrhagic infarcts involving the left lentiform nucleus and caudate. Acute nonhemorrhagic infarct is present within the posterior left insular cortex. The remainder of the left MCA territory is spared. T2 and FLAIR signal changes are associated with the areas of subacute infarction. Periventricular and subcortical T2 hyperintensities are otherwise stable. Subarachnoid hemorrhage is present in the left parietal lobe. No parenchymal hemorrhage is present. T2 hyperintensities within the central pons are stable. Remote nonhemorrhagic lacunar infarct in the medial left cerebellum is stable. A remote lacunar infarct in the anterior limb of the right internal capsule is stable. Vascular: Flow is present in the major intracranial arteries. Skull and upper cervical spine: The craniocervical junction is normal. Upper cervical spine is within normal limits. Marrow signal is unremarkable. Sinuses/Orbits: The paranasal sinuses and mastoid air cells are clear. The globes and orbits are within normal limits. IMPRESSION: 1. Acute/subacute nonhemorrhagic infarcts involving the left lentiform nucleus and caudate. 2. Acute/subacute nonhemorrhagic infarct involving the posterior left insular cortex. 3. The remainder of the left MCA territory is spared. 4. Subarachnoid hemorrhage in the left parietal lobe. 5. Stable atrophy and white matter disease elsewhere likely reflects  the sequela of chronic microvascular ischemia.  Electronically Signed   By: San Morelle M.D.   On: 12/10/2021 15:35   ECHOCARDIOGRAM COMPLETE  Result Date: 12/10/2021    ECHOCARDIOGRAM REPORT   Patient Name:   Janice Curtis Date of Exam: 12/10/2021 Medical Rec #:  [id number]       Height:       63.0 in Accession #:    [id number]      Weight:       196.6 lb Date of Birth:  April 13, 1940       BSA:          1.920 m Patient Age:    32 years        BP:           129/73 mmHg Patient Gender: F               HR:           64 bpm. Exam Location:  Inpatient Procedure: 2D Echo, Cardiac Doppler, Color Doppler and Intracardiac            Opacification Agent Indications:    Stroke I63.9  History:        Patient has prior history of Echocardiogram examinations, most                 recent 10/20/2020. Risk Factors:Hypertension and Dyslipidemia.  Sonographer:    Darlina Sicilian RDCS Referring Phys: Amie Portland IMPRESSIONS  1. Left ventricular ejection fraction, by estimation, is 60 to 65%. The left ventricle has normal function. The left ventricle has no regional wall motion abnormalities. There is moderate left ventricular hypertrophy of the basal-septal segment. Left ventricular diastolic parameters are indeterminate.  2. Right ventricular systolic function is normal. The right ventricular size is normal. There is normal pulmonary artery systolic pressure. The estimated right ventricular systolic pressure is 59.1 mmHg.  3. The mitral valve is normal in structure. No evidence of mitral valve regurgitation. No evidence of mitral stenosis.  4. Tricuspid valve regurgitation is moderate.  5. The aortic valve is tricuspid. Aortic valve regurgitation is mild. No aortic stenosis is present.  6. Pulmonic valve regurgitation is moderate.  7. The inferior vena cava is normal in size with greater than 50% respiratory variability, suggesting right atrial pressure of 3 mmHg. Conclusion(s)/Recommendation(s): No intracardiac source of embolism detected on this transthoracic study.  Consider a transesophageal echocardiogram to exclude cardiac source of embolism if clinically indicated. FINDINGS  Left Ventricle: Left ventricular ejection fraction, by estimation, is 60 to 65%. The left ventricle has normal function. The left ventricle has no regional wall motion abnormalities. Definity contrast agent was given IV to delineate the left ventricular  endocardial borders. The left ventricular internal cavity size was normal in size. There is moderate left ventricular hypertrophy of the basal-septal segment. Left ventricular diastolic parameters are indeterminate. Right Ventricle: The right ventricular size is normal. No increase in right ventricular wall thickness. Right ventricular systolic function is normal. There is normal pulmonary artery systolic pressure. The tricuspid regurgitant velocity is 2.52 m/s, and  with an assumed right atrial pressure of 8 mmHg, the estimated right ventricular systolic pressure is 63.8 mmHg. Left Atrium: Left atrial size was normal in size. Right Atrium: Right atrial size was normal in size. Pericardium: There is no evidence of pericardial effusion. Mitral Valve: The mitral valve is normal in structure. No evidence of mitral valve regurgitation. No evidence of mitral valve stenosis. Tricuspid Valve: The tricuspid valve is  normal in structure. Tricuspid valve regurgitation is moderate . No evidence of tricuspid stenosis. Aortic Valve: The aortic valve is tricuspid. Aortic valve regurgitation is mild. No aortic stenosis is present. Pulmonic Valve: The pulmonic valve was normal in structure. Pulmonic valve regurgitation is moderate. No evidence of pulmonic stenosis. Aorta: The aortic root is normal in size and structure. Venous: The inferior vena cava is normal in size with greater than 50% respiratory variability, suggesting right atrial pressure of 3 mmHg. IAS/Shunts: No atrial level shunt detected by color flow Doppler.  LEFT VENTRICLE PLAX 2D LVIDd:         3.95 cm      Diastology LVIDs:         2.70 cm     LV e' medial:    8.11 cm/s LV PW:         0.90 cm     LV E/e' medial:  11.1 LV IVS:        1.65 cm     LV e' lateral:   7.90 cm/s LVOT diam:     1.70 cm     LV E/e' lateral: 11.4 LV SV:         41 LV SV Index:   22 LVOT Area:     2.27 cm  LV Volumes (MOD) LV vol d, MOD A2C: 67.7 ml LV vol d, MOD A4C: 76.6 ml LV vol s, MOD A2C: 27.9 ml LV vol s, MOD A4C: 32.5 ml LV SV MOD A2C:     39.8 ml LV SV MOD A4C:     76.6 ml LV SV MOD BP:      43.2 ml RIGHT VENTRICLE RV S prime:     9.14 cm/s TAPSE (M-mode): 1.1 cm LEFT ATRIUM             Index        RIGHT ATRIUM           Index LA diam:        4.20 cm 2.19 cm/m   RA Area:     15.00 cm LA Vol (A2C):   57.1 ml 29.74 ml/m  RA Volume:   32.70 ml  17.03 ml/m LA Vol (A4C):   51.6 ml 26.88 ml/m LA Biplane Vol: 55.7 ml 29.01 ml/m  AORTIC VALVE             PULMONIC VALVE LVOT Vmax:   75.00 cm/s  PR End Diast Vel: 1.96 msec LVOT Vmean:  55.200 cm/s LVOT VTI:    0.182 m  AORTA Ao Root diam: 2.50 cm Ao Asc diam:  3.30 cm MITRAL VALVE               TRICUSPID VALVE MV Area (PHT): 3.90 cm    TR Peak grad:   25.4 mmHg MV Decel Time: 195 msec    TR Vmax:        252.00 cm/s MV E velocity: 90.30 cm/s                            SHUNTS                            Systemic VTI:  0.18 m                            Systemic Diam: 1.70 cm Candee Furbish MD Electronically signed  by Candee Furbish MD Signature Date/Time: 12/10/2021/12:23:29 PM    Final    DG CHEST PORT 1 VIEW  Result Date: 12/09/2021 CLINICAL DATA:  Acute respiratory failure EXAM: PORTABLE CHEST 1 VIEW COMPARISON:  Previous studies including the examination of 10/02/2017 FINDINGS: Transverse diameter of heart is increased. There are no signs of pulmonary edema or focal pulmonary consolidation. There is no pleural effusion or pneumothorax. Tip of endotracheal tube is 2.7 cm above the carina. Enteric tube is noted traversing the esophagus. IMPRESSION: There are no signs of pulmonary edema or  focal pulmonary consolidation. Electronically Signed   By: Elmer Picker M.D.   On: 12/09/2021 20:06   DG Abd Portable 1V  Result Date: 12/09/2021 CLINICAL DATA:  Evaluate location of enteric tube EXAM: PORTABLE ABDOMEN - 1 VIEW COMPARISON:  None Available. FINDINGS: Tip of enteric tube is seen in the region of distal antrum/pylorus. There is contrast in pelvocaliceal systems in both kidneys residual from previous intravenous contrast administration. IMPRESSION: Tip of enteric tube is seen in the region of distal antrum/pylorus of the stomach. Electronically Signed   By: Elmer Picker M.D.   On: 12/09/2021 20:05   CT ANGIO HEAD W OR WO CONTRAST  Result Date: 12/09/2021 CLINICAL DATA:  Right arm weakness.  Left-sided gaze. EXAM: CT ANGIOGRAPHY HEAD AND NECK TECHNIQUE: Multidetector CT imaging of the head and neck was performed using the standard protocol during bolus administration of intravenous contrast. Multiplanar CT image reconstructions and MIPs were obtained to evaluate the vascular anatomy. Carotid stenosis measurements (when applicable) are obtained utilizing NASCET criteria, using the distal internal carotid diameter as the denominator. RADIATION DOSE REDUCTION: This exam was performed according to the departmental dose-optimization program which includes automated exposure control, adjustment of the mA and/or kV according to patient size and/or use of iterative reconstruction technique. CONTRAST:  40m OMNIPAQUE IOHEXOL 350 MG/ML SOLN COMPARISON:  None Available. FINDINGS: CTA NECK FINDINGS Aortic arch: Common origin of the left common carotid artery and innominate artery noted. Atherosclerotic calcifications are present in the distal arch and at the origin the innominate artery without significant stenosis or aneurysm. Right carotid system: Right common carotid artery is within normal limits. Dense calcifications are present bifurcation. High-grade, near occlusive stenosis is present  at the bifurcation. Mild tortuosity is present in the cervical right ICA without an additional stenosis. Left carotid system: Left common carotid artery is within normal limits. High-grade proximal ICA stenosis is present. The more distal cervical ICA is mildly tortuous without additional stenosis. Vertebral arteries: The right vertebral artery is the dominant vessel. Moderate stenosis is present at the origin at the right vertebral artery. The left vertebral artery is hypoplastic. It is occluded proximally with reconstitution at the C4 level. It is occluded again at the C2-3 level. Skeleton: Multilevel degenerative changes are present in the cervical spine. Vertebral body heights maintained. No focal osseous lesions are present. Other neck: Soft tissues the neck are otherwise unremarkable. Salivary glands are within normal limits. Thyroid is normal. No significant adenopathy is present. No focal mucosal or submucosal lesions are present. Upper chest: The lung apices are clear. Thoracic inlet is within normal limits. Review of the MIP images confirms the above findings CTA HEAD FINDINGS Anterior circulation: Atherosclerotic calcifications are present within the cavernous internal carotid arteries, right greater than left without significant stenosis through the ICA termini. Moderate stenosis is present in the right M1 segment. A1 segments are normal bilaterally. A proximal left M1 occlusion is present. Limited collaterals are  present anteriorly. Segmental narrowing is present within distal right MCA branch vessels without a significant proximal stenosis or occlusion. Mild irregularity is present throughout the ACA branch vessels without a significant proximal stenosis or occlusion. Posterior circulation: The left vertebral artery is occluded. The distal V4 segment is reconstituted with some flow into the left PICA, likely retrograde. The right vertebral artery is mildly irregular without a significant stenosis. The  basilar artery is mildly irregular without significant stenosis. Both posterior cerebral arteries originate from basilar tip. Moderate stenosis is present in the proximal right P2 segment. PCA branch vessels are within normal limits bilaterally. Venous sinuses: The dural sinuses are patent. The straight sinus and deep cerebral veins are intact. Cortical veins are within normal limits. No significant vascular malformation is evident. Anatomic variants: None Review of the MIP images confirms the above findings IMPRESSION: 1. Proximal left M1 occlusion with limited collaterals within the anterior aspect of the left MCA distribution. 2. Moderate stenosis of the right M1 segment. 3. Segmental narrowing within distal right MCA branch vessels without a significant proximal stenosis or occlusion. 4. Moderate stenosis of the proximal right P2 segment. 5. 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 It is occluded again at the C2-3 level. 6. High-grade stenoses of the proximal internal carotid arteries bilaterally at the carotid bifurcations. 7. Moderate stenosis at the origin of the right vertebral artery. The above was relayed via text pager to Dr. Kerney Elbe on 12/09/2021 at 15:55. Electronically Signed   By: San Morelle M.D.   On: 12/09/2021 16:07   CT ANGIO NECK W OR WO CONTRAST  Result Date: 12/09/2021 CLINICAL DATA:  Right arm weakness.  Left-sided gaze. EXAM: CT ANGIOGRAPHY HEAD AND NECK TECHNIQUE: Multidetector CT imaging of the head and neck was performed using the standard protocol during bolus administration of intravenous contrast. Multiplanar CT image reconstructions and MIPs were obtained to evaluate the vascular anatomy. Carotid stenosis measurements (when applicable) are obtained utilizing NASCET criteria, using the distal internal carotid diameter as the denominator. RADIATION DOSE REDUCTION: This exam was performed according to the departmental  dose-optimization program which includes automated exposure control, adjustment of the mA and/or kV according to patient size and/or use of iterative reconstruction technique. CONTRAST:  75m OMNIPAQUE IOHEXOL 350 MG/ML SOLN COMPARISON:  None Available. FINDINGS: CTA NECK FINDINGS Aortic arch: Common origin of the left common carotid artery and innominate artery noted. Atherosclerotic calcifications are present in the distal arch and at the origin the innominate artery without significant stenosis or aneurysm. Right carotid system: Right common carotid artery is within normal limits. Dense calcifications are present bifurcation. High-grade, near occlusive stenosis is present at the bifurcation. Mild tortuosity is present in the cervical right ICA without an additional stenosis. Left carotid system: Left common carotid artery is within normal limits. High-grade proximal ICA stenosis is present. The more distal cervical ICA is mildly tortuous without additional stenosis. Vertebral arteries: The right vertebral artery is the dominant vessel. Moderate stenosis is present at the origin at the right vertebral artery. The left vertebral artery is hypoplastic. It is occluded proximally with reconstitution at the C4 level. It is occluded again at the C2-3 level. Skeleton: Multilevel degenerative changes are present in the cervical spine. Vertebral body heights maintained. No focal osseous lesions are present. Other neck: Soft tissues the neck are otherwise unremarkable. Salivary glands are within normal limits. Thyroid is normal. No significant adenopathy is present. No focal mucosal or submucosal lesions are  present. Upper chest: The lung apices are clear. Thoracic inlet is within normal limits. Review of the MIP images confirms the above findings CTA HEAD FINDINGS Anterior circulation: Atherosclerotic calcifications are present within the cavernous internal carotid arteries, right greater than left without significant  stenosis through the ICA termini. Moderate stenosis is present in the right M1 segment. A1 segments are normal bilaterally. A proximal left M1 occlusion is present. Limited collaterals are present anteriorly. Segmental narrowing is present within distal right MCA branch vessels without a significant proximal stenosis or occlusion. Mild irregularity is present throughout the ACA branch vessels without a significant proximal stenosis or occlusion. Posterior circulation: The left vertebral artery is occluded. The distal V4 segment is reconstituted with some flow into the left PICA, likely retrograde. The right vertebral artery is mildly irregular without a significant stenosis. The basilar artery is mildly irregular without significant stenosis. Both posterior cerebral arteries originate from basilar tip. Moderate stenosis is present in the proximal right P2 segment. PCA branch vessels are within normal limits bilaterally. Venous sinuses: The dural sinuses are patent. The straight sinus and deep cerebral veins are intact. Cortical veins are within normal limits. No significant vascular malformation is evident. Anatomic variants: None Review of the MIP images confirms the above findings IMPRESSION: 1. Proximal left M1 occlusion with limited collaterals within the anterior aspect of the left MCA distribution. 2. Moderate stenosis of the right M1 segment. 3. Segmental narrowing within distal right MCA branch vessels without a significant proximal stenosis or occlusion. 4. Moderate stenosis of the proximal right P2 segment. 5. 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 It is occluded again at the C2-3 level. 6. High-grade stenoses of the proximal internal carotid arteries bilaterally at the carotid bifurcations. 7. Moderate stenosis at the origin of the right vertebral artery. The above was relayed via text pager to Dr. Kerney Elbe on 12/09/2021 at 15:55. Electronically Signed    By: San Morelle M.D.   On: 12/09/2021 16:07   CT HEAD CODE STROKE WO CONTRAST  Result Date: 12/09/2021 CLINICAL DATA:  Code stroke. Neuro deficit, acute, stroke suspected. Right arm weakness. Left-sided gaze. EXAM: CT HEAD WITHOUT CONTRAST TECHNIQUE: Contiguous axial images were obtained from the base of the skull through the vertex without intravenous contrast. RADIATION DOSE REDUCTION: This exam was performed according to the departmental dose-optimization program which includes automated exposure control, adjustment of the mA and/or kV according to patient size and/or use of iterative reconstruction technique. COMPARISON:  MR head 10/20/2020. CT head without contrast 10/20/2020. FINDINGS: Brain: A remote infarct is again noted within the right corona radiata. A remote lacunar infarct is present in the right caudate head. Remote infarcts of the inferior left cerebellum are again noted. Mild generalized atrophy and white matter disease is otherwise stable. Basal ganglia are intact. Insular ribbon is normal bilaterally. No acute or focal cortical abnormality is present. No acute hemorrhage or mass lesion is present. Brainstem and cerebellum are otherwise within normal limits. Vascular: Atherosclerotic calcifications are present along the dural margin of the left vertebral artery. No hyperdense vessel is present. Skull: Calvarium is intact. No focal lytic or blastic lesions are present. No significant extracranial soft tissue lesion is present. Sinuses/Orbits: The paranasal sinuses and mastoid air cells are clear. The globes and orbits are within normal limits. ASPECTS [facility] Stroke Program Early CT Score) - Ganglionic level infarction (caudate, lentiform nuclei, internal capsule, insula, M1-M3 cortex): 7/7 - Supraganglionic infarction (M4-M6 cortex): 3/3  Total score (0-10 with 10 being normal): 10/10 IMPRESSION: 1. No acute intracranial abnormality or significant interval change. 2. Stable remote  infarcts of the right corona radiata, right caudate head, and inferior left cerebellum. 3. Stable atrophy and white matter disease. This likely reflects the sequela of chronic microvascular ischemia. 4. Aspects is 10/10. These results were called by telephone at the time of interpretation on 12/09/2021 at 2:46 pm to provider Campbell Stall , who verbally acknowledged these results. Electronically Signed   By: San Morelle M.D.   On: 12/09/2021 14:49    Assessment and Plan:   #New onset atrial fibrillation #Left MCA stroke -Admitted with right-sided weakness.  Found to have left MCA stroke.  Status post left thrombectomy.  Work-up demonstrates new onset atrial fibrillation.  Seems to be rate controlled.  We will start metoprolol tartrate 25 mg twice daily. -Anticoagulation is indicated.  Would start Eliquis 5 mg twice daily once cleared by neurology. -Overall she is unaware of her atrial fibrillation.  She is fairly rate controlled without medication.  She is stable and without symptoms of her atrial fibrillation.  Would recommend a rate control strategy given her age and lack of symptoms. -We will check a TSH tomorrow.  I will do this. -She has no signs of heart failure.  I reviewed her echocardiogram.  No significant valvular heart disease.  Would just recommend anticoagulation once able.  Would not recommend further cardiac work-up. -She also has evidence of chronic microvascular ischemia.  Would recommend aggressive at lowering therapy.  Her Crestor has been increased appropriately to 40 mg daily.  For questions or updates, please contact [place] Please consult [email or web address] for contact info under   Signed, [place] T. Audie Box, MD, [place]  12/11/2021 3:53 PM

## 2021-12-11 NOTE — Progress Notes (Signed)
Supervising Physician: Luanne Bras  Patient Status:  [facility] - In-pt  Chief Complaint:  2 day s/p revascularization of left MCA via mechanical thrombectomy  Subjective:  Awake, responsive, lying in bed.  Answering most questions appropriately.  Allergies: Patient has no known allergies.  Medications: Prior to Admission medications   Medication Sig Start Date End Date Taking? Authorizing Provider  acetaminophen (TYLENOL) 500 MG tablet Take 500 mg by mouth every 6 (six) hours as needed. For pain    [provider]  amLODipine (NORVASC) 10 MG tablet Take 5 mg by mouth daily.  12/29/13   [provider]  aspirin EC 81 MG EC tablet Take 1 tablet (81 mg total) by mouth daily. Swallow whole. 10/22/20   Patrecia Pour, MD  Cholecalciferol (VITAMIN D3) 1000 UNITS CAPS Take 1,000 Units by mouth daily.      [provider]  clopidogrel (PLAVIX) 75 MG tablet Take 75 mg by mouth daily.    [provider]  rosuvastatin (CRESTOR) 10 MG tablet TAKE 1 TABLET BY MOUTH DAILY. PLEASE MAKE OVERDUE APPT WITH DR. ROSS 07/24/21   Fay Records, MD  verapamil (VERELAN PM) 180 MG 24 hr capsule Take 180 mg by mouth in the morning and at bedtime. 10/11/20   [provider]     Vital Signs: BP 122/69   Pulse 73   Temp 98.3 F (36.8 C) (Axillary)   Resp (!) 21   Ht '5\' 3"'$  (1.6 m)   Wt 196 lb 10.4 oz (89.2 kg)   SpO2 96%   BMI 34.84 kg/m   Physical Exam Vitals reviewed.  Constitutional:      General: She is not in acute distress. HENT:     Head: Normocephalic and atraumatic.     Mouth/Throat:     Pharynx: Oropharynx is clear.  Eyes:     Extraocular Movements: Extraocular movements intact.     Conjunctiva/sclera: Conjunctivae normal.  Cardiovascular:     Rate and Rhythm: Normal rate.     Pulses: Normal pulses.     Comments: R CFA site, soft, not bleeding, no hematoma Pulmonary:     Effort: Pulmonary effort is normal. No respiratory distress.   Skin:    General: Skin is warm and dry.  Neurological:     Mental Status: She is alert.     Comments: CN 2-12 grossly intact Mild right pronator drift Weakness of RUQ and RLE, but with promising movement        Imaging: MR BRAIN WO CONTRAST  Result Date: 12/10/2021 CLINICAL DATA:  Stroke, follow-up.  Status post TNK. EXAM: MRI HEAD WITHOUT CONTRAST TECHNIQUE: Multiplanar, multiecho pulse sequences of the brain and surrounding structures were obtained without intravenous contrast. COMPARISON:  CT head and CTA head and neck 12/09/2021. MR head 10/20/2020 FINDINGS: Brain: Diffusion-weighted images demonstrate acute/subacute nonhemorrhagic infarcts involving the left lentiform nucleus and caudate. Acute nonhemorrhagic infarct is present within the posterior left insular cortex. The remainder of the left MCA territory is spared. T2 and FLAIR signal changes are associated with the areas of subacute infarction. Periventricular and subcortical T2 hyperintensities are otherwise stable. Subarachnoid hemorrhage is present in the left parietal lobe. No parenchymal hemorrhage is present. T2 hyperintensities within the central pons are stable. Remote nonhemorrhagic lacunar infarct in the medial left cerebellum is stable. A remote lacunar infarct in the anterior limb of the right internal capsule is stable. Vascular: Flow is present in the major intracranial arteries. Skull and upper cervical spine:  The craniocervical junction is normal. Upper cervical spine is within normal limits. Marrow signal is unremarkable. Sinuses/Orbits: The paranasal sinuses and mastoid air cells are clear. The globes and orbits are within normal limits. IMPRESSION: 1. Acute/subacute nonhemorrhagic infarcts involving the left lentiform nucleus and caudate. 2. Acute/subacute nonhemorrhagic infarct involving the posterior left insular cortex. 3. The remainder of the left MCA territory is spared. 4. Subarachnoid hemorrhage in the left  parietal lobe. 5. Stable atrophy and white matter disease elsewhere likely reflects the sequela of chronic microvascular ischemia. Electronically Signed   By: San Morelle M.D.   On: 12/10/2021 15:35   ECHOCARDIOGRAM COMPLETE  Result Date: 12/10/2021    ECHOCARDIOGRAM REPORT   Patient Name:   Janice Curtis Date of Exam: 12/10/2021 Medical Rec #:  [id number]       Height:       63.0 in Accession #:    [id number]      Weight:       196.6 lb Date of Birth:  [birth year]       BSA:          1.920 m Patient Age:    5 years        BP:           129/73 mmHg Patient Gender: F               HR:           64 bpm. Exam Location:  Inpatient Procedure: 2D Echo, Cardiac Doppler, Color Doppler and Intracardiac            Opacification Agent Indications:    Stroke I63.9  History:        Patient has prior history of Echocardiogram examinations, most                 recent 10/20/2020. Risk Factors:Hypertension and Dyslipidemia.  Sonographer:    Darlina Sicilian RDCS Referring Phys: Amie Portland IMPRESSIONS  1. Left ventricular ejection fraction, by estimation, is 60 to 65%. The left ventricle has normal function. The left ventricle has no regional wall motion abnormalities. There is moderate left ventricular hypertrophy of the basal-septal segment. Left ventricular diastolic parameters are indeterminate.  2. Right ventricular systolic function is normal. The right ventricular size is normal. There is normal pulmonary artery systolic pressure. The estimated right ventricular systolic pressure is 85.6 mmHg.  3. The mitral valve is normal in structure. No evidence of mitral valve regurgitation. No evidence of mitral stenosis.  4. Tricuspid valve regurgitation is moderate.  5. The aortic valve is tricuspid. Aortic valve regurgitation is mild. No aortic stenosis is present.  6. Pulmonic valve regurgitation is moderate.  7. The inferior vena cava is normal in size with greater than 50% respiratory variability, suggesting right  atrial pressure of 3 mmHg. Conclusion(s)/Recommendation(s): No intracardiac source of embolism detected on this transthoracic study. Consider a transesophageal echocardiogram to exclude cardiac source of embolism if clinically indicated. FINDINGS  Left Ventricle: Left ventricular ejection fraction, by estimation, is 60 to 65%. The left ventricle has normal function. The left ventricle has no regional wall motion abnormalities. Definity contrast agent was given IV to delineate the left ventricular  endocardial borders. The left ventricular internal cavity size was normal in size. There is moderate left ventricular hypertrophy of the basal-septal segment. Left ventricular diastolic parameters are indeterminate. Right Ventricle: The right ventricular size is normal. No increase in right ventricular wall thickness. Right ventricular systolic function is normal. There is normal  pulmonary artery systolic pressure. The tricuspid regurgitant velocity is 2.52 m/s, and  with an assumed right atrial pressure of 8 mmHg, the estimated right ventricular systolic pressure is 70.2 mmHg. Left Atrium: Left atrial size was normal in size. Right Atrium: Right atrial size was normal in size. Pericardium: There is no evidence of pericardial effusion. Mitral Valve: The mitral valve is normal in structure. No evidence of mitral valve regurgitation. No evidence of mitral valve stenosis. Tricuspid Valve: The tricuspid valve is normal in structure. Tricuspid valve regurgitation is moderate . No evidence of tricuspid stenosis. Aortic Valve: The aortic valve is tricuspid. Aortic valve regurgitation is mild. No aortic stenosis is present. Pulmonic Valve: The pulmonic valve was normal in structure. Pulmonic valve regurgitation is moderate. No evidence of pulmonic stenosis. Aorta: The aortic root is normal in size and structure. Venous: The inferior vena cava is normal in size with greater than 50% respiratory variability, suggesting right atrial  pressure of 3 mmHg. IAS/Shunts: No atrial level shunt detected by color flow Doppler.  LEFT VENTRICLE PLAX 2D LVIDd:         3.95 cm     Diastology LVIDs:         2.70 cm     LV e' medial:    8.11 cm/s LV PW:         0.90 cm     LV E/e' medial:  11.1 LV IVS:        1.65 cm     LV e' lateral:   7.90 cm/s LVOT diam:     1.70 cm     LV E/e' lateral: 11.4 LV SV:         41 LV SV Index:   22 LVOT Area:     2.27 cm  LV Volumes (MOD) LV vol d, MOD A2C: 67.7 ml LV vol d, MOD A4C: 76.6 ml LV vol s, MOD A2C: 27.9 ml LV vol s, MOD A4C: 32.5 ml LV SV MOD A2C:     39.8 ml LV SV MOD A4C:     76.6 ml LV SV MOD BP:      43.2 ml RIGHT VENTRICLE RV S prime:     9.14 cm/s TAPSE (M-mode): 1.1 cm LEFT ATRIUM             Index        RIGHT ATRIUM           Index LA diam:        4.20 cm 2.19 cm/m   RA Area:     15.00 cm LA Vol (A2C):   57.1 ml 29.74 ml/m  RA Volume:   32.70 ml  17.03 ml/m LA Vol (A4C):   51.6 ml 26.88 ml/m LA Biplane Vol: 55.7 ml 29.01 ml/m  AORTIC VALVE             PULMONIC VALVE LVOT Vmax:   75.00 cm/s  PR End Diast Vel: 1.96 msec LVOT Vmean:  55.200 cm/s LVOT VTI:    0.182 m  AORTA Ao Root diam: 2.50 cm Ao Asc diam:  3.30 cm MITRAL VALVE               TRICUSPID VALVE MV Area (PHT): 3.90 cm    TR Peak grad:   25.4 mmHg MV Decel Time: 195 msec    TR Vmax:        252.00 cm/s MV E velocity: 90.30 cm/s  SHUNTS                            Systemic VTI:  0.18 m                            Systemic Diam: 1.70 cm Candee Furbish MD Electronically signed by Candee Furbish MD Signature Date/Time: 12/10/2021/12:23:29 PM    Final    DG CHEST PORT 1 VIEW  Result Date: 12/09/2021 CLINICAL DATA:  Acute respiratory failure EXAM: PORTABLE CHEST 1 VIEW COMPARISON:  Previous studies including the examination of 10/02/2017 FINDINGS: Transverse diameter of heart is increased. There are no signs of pulmonary edema or focal pulmonary consolidation. There is no pleural effusion or pneumothorax. Tip of endotracheal  tube is 2.7 cm above the carina. Enteric tube is noted traversing the esophagus. IMPRESSION: There are no signs of pulmonary edema or focal pulmonary consolidation. Electronically Signed   By: Elmer Picker M.D.   On: 12/09/2021 20:06   DG Abd Portable 1V  Result Date: 12/09/2021 CLINICAL DATA:  Evaluate location of enteric tube EXAM: PORTABLE ABDOMEN - 1 VIEW COMPARISON:  None Available. FINDINGS: Tip of enteric tube is seen in the region of distal antrum/pylorus. There is contrast in pelvocaliceal systems in both kidneys residual from previous intravenous contrast administration. IMPRESSION: Tip of enteric tube is seen in the region of distal antrum/pylorus of the stomach. Electronically Signed   By: Elmer Picker M.D.   On: 12/09/2021 20:05   CT ANGIO HEAD W OR WO CONTRAST  Result Date: 12/09/2021 CLINICAL DATA:  Right arm weakness.  Left-sided gaze. EXAM: CT ANGIOGRAPHY HEAD AND NECK TECHNIQUE: Multidetector CT imaging of the head and neck was performed using the standard protocol during bolus administration of intravenous contrast. Multiplanar CT image reconstructions and MIPs were obtained to evaluate the vascular anatomy. Carotid stenosis measurements (when applicable) are obtained utilizing NASCET criteria, using the distal internal carotid diameter as the denominator. RADIATION DOSE REDUCTION: This exam was performed according to the departmental dose-optimization program which includes automated exposure control, adjustment of the mA and/or kV according to patient size and/or use of iterative reconstruction technique. CONTRAST:  77m OMNIPAQUE IOHEXOL 350 MG/ML SOLN COMPARISON:  None Available. FINDINGS: CTA NECK FINDINGS Aortic arch: Common origin of the left common carotid artery and innominate artery noted. Atherosclerotic calcifications are present in the distal arch and at the origin the innominate artery without significant stenosis or aneurysm. Right carotid system: Right common  carotid artery is within normal limits. Dense calcifications are present bifurcation. High-grade, near occlusive stenosis is present at the bifurcation. Mild tortuosity is present in the cervical right ICA without an additional stenosis. Left carotid system: Left common carotid artery is within normal limits. High-grade proximal ICA stenosis is present. The more distal cervical ICA is mildly tortuous without additional stenosis. Vertebral arteries: The right vertebral artery is the dominant vessel. Moderate stenosis is present at the origin at the right vertebral artery. The left vertebral artery is hypoplastic. It is occluded proximally with reconstitution at the C4 level. It is occluded again at the C2-3 level. Skeleton: Multilevel degenerative changes are present in the cervical spine. Vertebral body heights maintained. No focal osseous lesions are present. Other neck: Soft tissues the neck are otherwise unremarkable. Salivary glands are within normal limits. Thyroid is normal. No significant adenopathy is present. No focal mucosal or submucosal lesions are present. Upper chest: The  lung apices are clear. Thoracic inlet is within normal limits. Review of the MIP images confirms the above findings CTA HEAD FINDINGS Anterior circulation: Atherosclerotic calcifications are present within the cavernous internal carotid arteries, right greater than left without significant stenosis through the ICA termini. Moderate stenosis is present in the right M1 segment. A1 segments are normal bilaterally. A proximal left M1 occlusion is present. Limited collaterals are present anteriorly. Segmental narrowing is present within distal right MCA branch vessels without a significant proximal stenosis or occlusion. Mild irregularity is present throughout the ACA branch vessels without a significant proximal stenosis or occlusion. Posterior circulation: The left vertebral artery is occluded. The distal V4 segment is reconstituted with  some flow into the left PICA, likely retrograde. The right vertebral artery is mildly irregular without a significant stenosis. The basilar artery is mildly irregular without significant stenosis. Both posterior cerebral arteries originate from basilar tip. Moderate stenosis is present in the proximal right P2 segment. PCA branch vessels are within normal limits bilaterally. Venous sinuses: The dural sinuses are patent. The straight sinus and deep cerebral veins are intact. Cortical veins are within normal limits. No significant vascular malformation is evident. Anatomic variants: None Review of the MIP images confirms the above findings IMPRESSION: 1. Proximal left M1 occlusion with limited collaterals within the anterior aspect of the left MCA distribution. 2. Moderate stenosis of the right M1 segment. 3. Segmental narrowing within distal right MCA branch vessels without a significant proximal stenosis or occlusion. 4. Moderate stenosis of the proximal right P2 segment. 5. 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 It is occluded again at the C2-3 level. 6. High-grade stenoses of the proximal internal carotid arteries bilaterally at the carotid bifurcations. 7. Moderate stenosis at the origin of the right vertebral artery. The above was relayed via text pager to Dr. Kerney Elbe on 12/09/2021 at 15:55. Electronically Signed   By: San Morelle M.D.   On: 12/09/2021 16:07   CT ANGIO NECK W OR WO CONTRAST  Result Date: 12/09/2021 CLINICAL DATA:  Right arm weakness.  Left-sided gaze. EXAM: CT ANGIOGRAPHY HEAD AND NECK TECHNIQUE: Multidetector CT imaging of the head and neck was performed using the standard protocol during bolus administration of intravenous contrast. Multiplanar CT image reconstructions and MIPs were obtained to evaluate the vascular anatomy. Carotid stenosis measurements (when applicable) are obtained utilizing NASCET criteria, using the distal  internal carotid diameter as the denominator. RADIATION DOSE REDUCTION: This exam was performed according to the departmental dose-optimization program which includes automated exposure control, adjustment of the mA and/or kV according to patient size and/or use of iterative reconstruction technique. CONTRAST:  14m OMNIPAQUE IOHEXOL 350 MG/ML SOLN COMPARISON:  None Available. FINDINGS: CTA NECK FINDINGS Aortic arch: Common origin of the left common carotid artery and innominate artery noted. Atherosclerotic calcifications are present in the distal arch and at the origin the innominate artery without significant stenosis or aneurysm. Right carotid system: Right common carotid artery is within normal limits. Dense calcifications are present bifurcation. High-grade, near occlusive stenosis is present at the bifurcation. Mild tortuosity is present in the cervical right ICA without an additional stenosis. Left carotid system: Left common carotid artery is within normal limits. High-grade proximal ICA stenosis is present. The more distal cervical ICA is mildly tortuous without additional stenosis. Vertebral arteries: The right vertebral artery is the dominant vessel. Moderate stenosis is present at the origin at the right vertebral artery. The left vertebral artery is  hypoplastic. It is occluded proximally with reconstitution at the C4 level. It is occluded again at the C2-3 level. Skeleton: Multilevel degenerative changes are present in the cervical spine. Vertebral body heights maintained. No focal osseous lesions are present. Other neck: Soft tissues the neck are otherwise unremarkable. Salivary glands are within normal limits. Thyroid is normal. No significant adenopathy is present. No focal mucosal or submucosal lesions are present. Upper chest: The lung apices are clear. Thoracic inlet is within normal limits. Review of the MIP images confirms the above findings CTA HEAD FINDINGS Anterior circulation:  Atherosclerotic calcifications are present within the cavernous internal carotid arteries, right greater than left without significant stenosis through the ICA termini. Moderate stenosis is present in the right M1 segment. A1 segments are normal bilaterally. A proximal left M1 occlusion is present. Limited collaterals are present anteriorly. Segmental narrowing is present within distal right MCA branch vessels without a significant proximal stenosis or occlusion. Mild irregularity is present throughout the ACA branch vessels without a significant proximal stenosis or occlusion. Posterior circulation: The left vertebral artery is occluded. The distal V4 segment is reconstituted with some flow into the left PICA, likely retrograde. The right vertebral artery is mildly irregular without a significant stenosis. The basilar artery is mildly irregular without significant stenosis. Both posterior cerebral arteries originate from basilar tip. Moderate stenosis is present in the proximal right P2 segment. PCA branch vessels are within normal limits bilaterally. Venous sinuses: The dural sinuses are patent. The straight sinus and deep cerebral veins are intact. Cortical veins are within normal limits. No significant vascular malformation is evident. Anatomic variants: None Review of the MIP images confirms the above findings IMPRESSION: 1. Proximal left M1 occlusion with limited collaterals within the anterior aspect of the left MCA distribution. 2. Moderate stenosis of the right M1 segment. 3. Segmental narrowing within distal right MCA branch vessels without a significant proximal stenosis or occlusion. 4. Moderate stenosis of the proximal right P2 segment. 5. 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 It is occluded again at the C2-3 level. 6. High-grade stenoses of the proximal internal carotid arteries bilaterally at the carotid bifurcations. 7. Moderate stenosis at the origin of  the right vertebral artery. The above was relayed via text pager to Dr. Kerney Elbe on 12/09/2021 at 15:55. Electronically Signed   By: San Morelle M.D.   On: 12/09/2021 16:07   CT HEAD CODE STROKE WO CONTRAST  Result Date: 12/09/2021 CLINICAL DATA:  Code stroke. Neuro deficit, acute, stroke suspected. Right arm weakness. Left-sided gaze. EXAM: CT HEAD WITHOUT CONTRAST TECHNIQUE: Contiguous axial images were obtained from the base of the skull through the vertex without intravenous contrast. RADIATION DOSE REDUCTION: This exam was performed according to the departmental dose-optimization program which includes automated exposure control, adjustment of the mA and/or kV according to patient size and/or use of iterative reconstruction technique. COMPARISON:  MR head 10/20/2020. CT head without contrast 10/20/2020. FINDINGS: Brain: A remote infarct is again noted within the right corona radiata. A remote lacunar infarct is present in the right caudate head. Remote infarcts of the inferior left cerebellum are again noted. Mild generalized atrophy and white matter disease is otherwise stable. Basal ganglia are intact. Insular ribbon is normal bilaterally. No acute or focal cortical abnormality is present. No acute hemorrhage or mass lesion is present. Brainstem and cerebellum are otherwise within normal limits. Vascular: Atherosclerotic calcifications are present along the dural margin of the left vertebral artery.  No hyperdense vessel is present. Skull: Calvarium is intact. No focal lytic or blastic lesions are present. No significant extracranial soft tissue lesion is present. Sinuses/Orbits: The paranasal sinuses and mastoid air cells are clear. The globes and orbits are within normal limits. ASPECTS [facility] Stroke Program Early CT Score) - Ganglionic level infarction (caudate, lentiform nuclei, internal capsule, insula, M1-M3 cortex): 7/7 - Supraganglionic infarction (M4-M6 cortex): 3/3 Total score (0-10  with 10 being normal): 10/10 IMPRESSION: 1. No acute intracranial abnormality or significant interval change. 2. Stable remote infarcts of the right corona radiata, right caudate head, and inferior left cerebellum. 3. Stable atrophy and white matter disease. This likely reflects the sequela of chronic microvascular ischemia. 4. Aspects is 10/10. These results were called by telephone at the time of interpretation on 12/09/2021 at 2:46 pm to provider Campbell Stall , who verbally acknowledged these results. Electronically Signed   By: San Morelle M.D.   On: 12/09/2021 14:49    Labs:  CBC: Recent Labs    12/09/21 1428 12/09/21 1437 12/09/21 2111 12/09/21 2327 12/10/21 0539  WBC 5.2  --   --   --  6.5  HGB 12.4 13.6 11.2* 11.2* 10.9*  HCT 38.6 40.0 33.0* 33.0* 33.3*  PLT 181  --   --   --  179    COAGS: Recent Labs    12/09/21 1428  INR 1.0  APTT 27    BMP: Recent Labs    12/09/21 1428 12/09/21 1437 12/09/21 2111 12/09/21 2327 12/10/21 0539  NA 134* 136 133* 135 136  K 3.2* 3.2* 3.4* 3.7 3.9  CL 103 102  --   --  104  CO2 22  --   --   --  21*  GLUCOSE 114* 113*  --   --  93  BUN 12 10  --   --  7*  CALCIUM 8.6*  --   --   --  8.4*  CREATININE 0.90 0.90  --   --  0.89  GFRNONAA >60  --   --   --  >60    LIVER FUNCTION TESTS: Recent Labs    12/09/21 1428  BILITOT 0.7  AST 32  ALT 23  ALKPHOS 69  PROT 7.6  ALBUMIN 3.9    Assessment and Plan:  2 days s/p Stroke with revascularization --patient with restoration of some speech and understanding, motor, and strength, but still with deficits, too.  Further management per Stroke team.  NIR available as needed.   Electronically Signed: Pasty Spillers, PA 12/11/2021, 9:26 AM   I spent a total of 15 Minutes at the the patient's bedside AND on the patient's hospital floor or unit, greater than 50% of which was counseling/coordinating care for post stroke care

## 2021-12-11 NOTE — Evaluation (Signed)
Clinical/Bedside Swallow Evaluation Patient Details  Name: Janice Curtis MRN: [id number] Date of Birth: 05-24-1940  Today's Date: 12/11/2021 Time: SLP Start Time (ACUTE ONLY): 110 SLP Stop Time (ACUTE ONLY): 19 SLP Time Calculation (min) (ACUTE ONLY): 21 min  Past Medical History:  Past Medical History:  Diagnosis Date   Anxiety    Colon cancer ([place])    Hypercholesterolemia    Hypertension    Obesity    Stroke ([place]) 12/05/2009   Past Surgical History:  Past Surgical History:  Procedure Laterality Date   COLONOSCOPY  12/27/08   simple adenoma/inflammatory polyp at anastomosis   COLONOSCOPY  04/23/2011   Procedure: COLONOSCOPY;  Surgeon: Dorothyann Peng, MD;  [facility]: [facility];  Service: Endoscopy;  Laterality: N/A;  10:00   RADIOLOGY WITH ANESTHESIA N/A 12/09/2021   Procedure: RADIOLOGY WITH ANESTHESIA;  Surgeon: Radiologist, Medication, MD;  Location: [facility];  Service: Radiology;  Laterality: N/A;   RIGHT COLECTOMY  11/14/2007   HPI:  Pt is a 81 y.o. female medical history significant for colon cancer, hypercholesterolemia, essential hypertension, obesity, and remote CVA (2011) who presented to [facility] on 12/09/2021 from [facility] ED for neuro IR after vessel imaging at [place] revealed a left M1 MCA occlusion.  Pt was found to have findings consistent with a left MCA territory infarction. MRI head found acute/subacute nonhemorrhagic infarcts involving the left lentiform nucleus caudate, posterior left insular cortex, and a subarachnoid hemorrhage in the left parietal lobe.    Assessment / Plan / Recommendation  Clinical Impression   Pt seen for clinical swallow evaluation s/p stroke. Pt alert throughout encounter but speech intelligibility is significantly reduced by low vocal intensity. Oral motor assessment indicates facial symmetry/strength, and lingual symmetry are WFL. Volitional cough presents as weak. Pt unable to complete tasks assessing lingual ROM/strength  and labial seal, however, suspect potential motor planning or verbal comprehension issues as reason for not completing tasks. Upper dentures donned prior to PO trials. Ice chips, thin liquids, puree and regular consistency trials administered. No overt s/sx aspiration note with puree/regular consistency, however, pt displayed immediate and delayed throat clear, delayed cough, wet vocal quality and multiple swallows with thin liquid trials. Due to noted s/sx aspiration, MBS recommended to objectively evaluate swallow function (planned 10/10). Pt start nectar thick/dys 3 diet, pills whole in puree until completion of MBS.  SLP Visit Diagnosis: Dysphagia, unspecified (R13.10)    Aspiration Risk  Mild aspiration risk    Diet Recommendation Dysphagia 3 (Mech soft);Nectar-thick liquid   Liquid Administration via: Straw;Cup Medication Administration: Crushed with puree Supervision: Staff to assist with self feeding Compensations: Minimize environmental distractions;Slow rate;Small sips/bites Postural Changes: Seated upright at 90 degrees    Other  Recommendations Oral Care Recommendations: Oral care BID    Recommendations for follow up therapy are one component of a multi-disciplinary discharge planning process, led by the attending physician.  Recommendations may be updated based on patient status, additional functional criteria and insurance authorization.  Follow up Recommendations Skilled nursing-short term rehab (<3 hours/day)      Assistance Recommended at Discharge Intermittent Supervision/Assistance  Functional Status Assessment Patient has had a recent decline in their functional status and demonstrates the ability to make significant improvements in function in a reasonable and predictable amount of time.  Frequency and Duration            Prognosis Prognosis for Safe Diet Advancement: Good Barriers to Reach Goals: Severity of deficits      Swallow  Study   General Date of  Onset: 12/09/21 HPI: Pt is a 81 y.o. female medical history significant for colon cancer, hypercholesterolemia, essential hypertension, obesity, and remote CVA (2011) who presented to [facility] on 12/09/2021 from [facility] ED for neuro IR after vessel imaging at [place] revealed a left M1 MCA occlusion.  Pt was found to have findings consistent with a left MCA territory infarction. MRI head found acute/subacute nonhemorrhagic infarcts involving the left lentiform nucleus caudate, posterior left insular cortex, and a subarachnoid hemorrhage in the left parietal lobe. Type of Study: Bedside Swallow Evaluation Diet Prior to this Study: NPO Temperature Spikes Noted: No Respiratory Status: Room air History of Recent Intubation: Yes Length of Intubations (days): 1 days Date extubated: 12/10/21 Behavior/Cognition: Alert;Cooperative;Pleasant mood Oral Cavity Assessment: Within Functional Limits Oral Care Completed by SLP: No Oral Cavity - Dentition: Dentures, top Vision: Functional for self-feeding Self-Feeding Abilities: Needs assist Patient Positioning: Upright in bed Baseline Vocal Quality: Low vocal intensity;Hoarse Volitional Cough: Weak Volitional Swallow: Able to elicit    Oral/Motor/Sensory Function Overall Oral Motor/Sensory Function: Mild impairment Facial ROM: Within Functional Limits Facial Symmetry: Within Functional Limits Facial Strength: Within Functional Limits Lingual ROM: Reduced right;Suspected CN XII (hypoglossal) dysfunction (Potential motor planning deficits) Lingual Symmetry: Within Functional Limits Lingual Strength: Reduced;Suspected CN XII (hypoglossal) dysfunction (Potential motor planning deficits) Velum: Within Functional Limits   Ice Chips Ice chips: Within functional limits Presentation: Spoon   Thin Liquid Thin Liquid: Impaired Presentation: Straw Pharyngeal  Phase Impairments: Suspected delayed Swallow;Multiple swallows;Wet Vocal Quality;Throat  Clearing - Delayed;Cough - Delayed;Throat Clearing - Immediate    Nectar Thick Nectar Thick Liquid: Not tested   Honey Thick Honey Thick Liquid: Not tested   Puree Puree: Within functional limits Presentation: Spoon   Solid     Solid: Within functional limits      [employer] Student SLP  12/11/2021,12:11 PM

## 2021-12-11 NOTE — Progress Notes (Signed)
NAME:  Janice Curtis, MRN:  [id number], DOB:  20-Jun-1940, LOS: 2 ADMISSION DATE:  12/09/2021, CONSULTATION DATE: 12/09/2021 REFERRING MD: Neurology CHIEF COMPLAINT: Status post left MCA  History of Present Illness:  81 year old female his past medical history is well-documented below was to have noted to have right-sided hemiplegia altered mental status he was evaluated by neurology and transported to [facility] for left MCA thrombectomy with neuro interventional team.  He is now status post left MCA thrombectomy on full mechanical ventilatory support, hemodynamic stable and pulmonary critical care is asked to manage her on the vent overnight goal systolic blood pressure 144 -140.  Pertinent  Medical History   Past Medical History:  Diagnosis Date   Anxiety    Colon cancer ([place])    Hypercholesterolemia    Hypertension    Obesity    Stroke ([place]) 12/05/2009     Significant Hospital Events: Including procedures, antibiotic start and stop dates in addition to other pertinent events   12/09/2021 left MCA thrombectomy 10/8 extubated  Interim History / Subjective:  Patient had 1 fever spike last night with Tmax 101 She is in A-fib with controlled rate Remains on room air  Objective   Blood pressure (!) 114/58, pulse 73, temperature 98.7 F (37.1 C), temperature source Axillary, resp. rate 18, height '5\' 3"'$  (1.6 m), weight 89.2 kg, SpO2 100 %.        Intake/Output Summary (Last 24 hours) at 12/11/2021 0749 Last data filed at 12/11/2021 0400 Gross per 24 hour  Intake 538.09 ml  Output 775 ml  Net -236.91 ml   Filed Weights   12/09/21 1437 12/09/21 1505  Weight: 86.5 kg 89.2 kg    Examination: Physical exam: General: Chronically ill-appearing obese female, lying on the bed HEENT: [place]/AT, eyes anicteric.  moist mucus membranes Neuro: Alert, awake following commands, antigravity in all 4 extremities, right-sided is weaker than left, with dysarthric speech Chest: Coarse breath  sounds, no wheezes or rhonchi Heart: Irregularly irregular, no murmurs or gallops Abdomen: Soft, nontender, nondistended, bowel sounds present Skin: No rash   Resolved Hospital Problem list     Assessment & Plan:  Acute respiratory insufficiency postprocedure, resolved Patient was successfully extubated yesterday Remain on room air  Acute left MCA stroke s/p TNK and mechanical thrombectomy Continue to watch every hour Stroke team is following Continue secondary stroke prophylaxis Defer antiplatelet and statin management to stroke team MRI brain showing left basal ganglia ischemic stroke and echocardiogram done which showed normal EF with no wall motion abnormalities  Hyponatremia/hypokalemia: Resolved  HLD: Statin   Best Practice (right click and "Reselect all SmartList Selections" daily)   Diet/type: NPO speech and swallow evaluation is pending DVT prophylaxis: other GI prophylaxis: PPI Lines: N/A Foley:  N/A Code Status:  full code Last date of multidisciplinary goals of care discussion [Per primary team]  Labs   CBC: Recent Labs  Lab 12/09/21 1428 12/09/21 1437 12/09/21 2111 12/09/21 2327 12/10/21 0539  WBC 5.2  --   --   --  6.5  NEUTROABS 1.8  --   --   --  4.1  HGB 12.4 13.6 11.2* 11.2* 10.9*  HCT 38.6 40.0 33.0* 33.0* 33.3*  MCV 81.4  --   --   --  81.6  PLT 181  --   --   --  818    Basic Metabolic Panel: Recent Labs  Lab 12/09/21 1428 12/09/21 1437 12/09/21 2111 12/09/21 2327 12/10/21 0539  NA 134* 136 133*  135 136  K 3.2* 3.2* 3.4* 3.7 3.9  CL 103 102  --   --  104  CO2 22  --   --   --  21*  GLUCOSE 114* 113*  --   --  93  BUN 12 10  --   --  7*  CREATININE 0.90 0.90  --   --  0.89  CALCIUM 8.6*  --   --   --  8.4*   GFR: Estimated Creatinine Clearance: 53.4 mL/min (by C-G formula based on SCr of 0.89 mg/dL). Recent Labs  Lab 12/09/21 1428 12/10/21 0539  WBC 5.2 6.5    Liver Function Tests: Recent Labs  Lab 12/09/21 1428   AST 32  ALT 23  ALKPHOS 69  BILITOT 0.7  PROT 7.6  ALBUMIN 3.9   No results for input(s): "LIPASE", "AMYLASE" in the last 168 hours. No results for input(s): "AMMONIA" in the last 168 hours.  ABG    Component Value Date/Time   PHART 7.351 12/09/2021 2327   PCO2ART 41.0 12/09/2021 2327   PO2ART 186 (H) 12/09/2021 2327   HCO3 22.9 12/09/2021 2327   TCO2 24 12/09/2021 2327   ACIDBASEDEF 3.0 (H) 12/09/2021 2327   O2SAT 100 12/09/2021 2327     Coagulation Profile: Recent Labs  Lab 12/09/21 1428  INR 1.0    Cardiac Enzymes: No results for input(s): "CKTOTAL", "CKMB", "CKMBINDEX", "TROPONINI" in the last 168 hours.  HbA1C: Hgb A1c MFr Bld  Date/Time Value Ref Range Status  12/09/2021 07:09 PM 5.7 (H) 4.8 - 5.6 % Final    Comment:    (NOTE) Pre diabetes:          5.7%-6.4%  Diabetes:              >6.4%  Glycemic control for   <7.0% adults with diabetes   10/20/2020 03:29 PM 6.0 (H) 4.8 - 5.6 % Final    Comment:    (NOTE) Pre diabetes:          5.7%-6.4%  Diabetes:              >6.4%  Glycemic control for   <7.0% adults with diabetes     CBG: No results for input(s): "GLUCAP" in the last 168 hours.       Jacky Kindle, MD [facility] See Amion for pager If no response to pager, please call [phone] until 7pm After 7pm, Please call E-link [phone]

## 2021-12-11 NOTE — Progress Notes (Signed)
STROKE TEAM PROGRESS NOTE   INTERVAL HISTORY  Patient is awake alert and interactive.  Speech is nonfluent and she follows commands well.  She has only mild right hemiparesis 4/5 strength with diminished right grip and fine finger movements.  Vital signs are stable.  Blood pressure adequately controlled.  She has new onset A-fib but rate appears to be controlled.  MRI yesterday showed left basal ganglia infarct with sparing of the cortex.  There is trace petechial hemorrhage.  She has been started on aspirin        Vitals:   12/11/21 0700 12/11/21 0800 12/11/21 0900 12/11/21 1000  BP: (!) 114/58 122/69 112/68 129/73  Pulse: 73 73 77 73  Resp: 18 (!) 21 (!) 21 17  Temp:  98.3 F (36.8 C)    TempSrc:  Axillary    SpO2: 100% 96% 96% 97%  Weight:      Height:       CBC:  Recent Labs  Lab 12/09/21 1428 12/09/21 1437 12/09/21 2327 12/10/21 0539  WBC 5.2  --   --  6.5  NEUTROABS 1.8  --   --  4.1  HGB 12.4   < > 11.2* 10.9*  HCT 38.6   < > 33.0* 33.3*  MCV 81.4  --   --  81.6  PLT 181  --   --  179   < > = values in this interval not displayed.   Basic Metabolic Panel:  Recent Labs  Lab 12/09/21 1428 12/09/21 1437 12/09/21 2111 12/09/21 2327 12/10/21 0539  NA 134* 136   < > 135 136  K 3.2* 3.2*   < > 3.7 3.9  CL 103 102  --   --  104  CO2 22  --   --   --  21*  GLUCOSE 114* 113*  --   --  93  BUN 12 10  --   --  7*  CREATININE 0.90 0.90  --   --  0.89  CALCIUM 8.6*  --   --   --  8.4*   < > = values in this interval not displayed.   Lipid Panel:  Recent Labs  Lab 12/10/21 0539  CHOL 196  TRIG 138  137  HDL 37*  CHOLHDL 5.3  VLDL 28  LDLCALC 131*   HgbA1c:  Recent Labs  Lab 12/09/21 1909  HGBA1C 5.7*   Urine Drug Screen: No results for input(s): "LABOPIA", "COCAINSCRNUR", "LABBENZ", "AMPHETMU", "THCU", "LABBARB" in the last 168 hours.  Alcohol Level  Recent Labs  Lab 12/09/21 1428  ETH <10    IMAGING past 24 hours MR BRAIN WO  CONTRAST  Result Date: 12/10/2021 CLINICAL DATA:  Stroke, follow-up.  Status post TNK. EXAM: MRI HEAD WITHOUT CONTRAST TECHNIQUE: Multiplanar, multiecho pulse sequences of the brain and surrounding structures were obtained without intravenous contrast. COMPARISON:  CT head and CTA head and neck 12/09/2021. MR head 10/20/2020 FINDINGS: Brain: Diffusion-weighted images demonstrate acute/subacute nonhemorrhagic infarcts involving the left lentiform nucleus and caudate. Acute nonhemorrhagic infarct is present within the posterior left insular cortex. The remainder of the left MCA territory is spared. T2 and FLAIR signal changes are associated with the areas of subacute infarction. Periventricular and subcortical T2 hyperintensities are otherwise stable. Subarachnoid hemorrhage is present in the left parietal lobe. No parenchymal hemorrhage is present. T2 hyperintensities within the central pons are stable. Remote nonhemorrhagic lacunar infarct in the medial left cerebellum is stable. A remote lacunar infarct in the anterior limb  of the right internal capsule is stable. Vascular: Flow is present in the major intracranial arteries. Skull and upper cervical spine: The craniocervical junction is normal. Upper cervical spine is within normal limits. Marrow signal is unremarkable. Sinuses/Orbits: The paranasal sinuses and mastoid air cells are clear. The globes and orbits are within normal limits. IMPRESSION: 1. Acute/subacute nonhemorrhagic infarcts involving the left lentiform nucleus and caudate. 2. Acute/subacute nonhemorrhagic infarct involving the posterior left insular cortex. 3. The remainder of the left MCA territory is spared. 4. Subarachnoid hemorrhage in the left parietal lobe. 5. Stable atrophy and white matter disease elsewhere likely reflects the sequela of chronic microvascular ischemia. Electronically Signed   By: San Morelle M.D.   On: 12/10/2021 15:35   ECHOCARDIOGRAM COMPLETE  Result Date:  12/10/2021    ECHOCARDIOGRAM REPORT   Patient Name:   Janice Curtis Date of Exam: 12/10/2021 Medical Rec #:  [id number]       Height:       63.0 in Accession #:    [id number]      Weight:       196.6 lb Date of Birth:  04-Dec-1940       BSA:          1.920 m Patient Age:    81 years        BP:           129/73 mmHg Patient Gender: F               HR:           64 bpm. Exam Location:  Inpatient Procedure: 2D Echo, Cardiac Doppler, Color Doppler and Intracardiac            Opacification Agent Indications:    Stroke I63.9  History:        Patient has prior history of Echocardiogram examinations, most                 recent 10/20/2020. Risk Factors:Hypertension and Dyslipidemia.  Sonographer:    Darlina Sicilian RDCS Referring Phys: Amie [place] IMPRESSIONS  1. Left ventricular ejection fraction, by estimation, is 60 to 65%. The left ventricle has normal function. The left ventricle has no regional wall motion abnormalities. There is moderate left ventricular hypertrophy of the basal-septal segment. Left ventricular diastolic parameters are indeterminate.  2. Right ventricular systolic function is normal. The right ventricular size is normal. There is normal pulmonary artery systolic pressure. The estimated right ventricular systolic pressure is 40.1 mmHg.  3. The mitral valve is normal in structure. No evidence of mitral valve regurgitation. No evidence of mitral stenosis.  4. Tricuspid valve regurgitation is moderate.  5. The aortic valve is tricuspid. Aortic valve regurgitation is mild. No aortic stenosis is present.  6. Pulmonic valve regurgitation is moderate.  7. The inferior vena cava is normal in size with greater than 50% respiratory variability, suggesting right atrial pressure of 3 mmHg. Conclusion(s)/Recommendation(s): No intracardiac source of embolism detected on this transthoracic study. Consider a transesophageal echocardiogram to exclude cardiac source of embolism if clinically indicated. FINDINGS  Left  Ventricle: Left ventricular ejection fraction, by estimation, is 60 to 65%. The left ventricle has normal function. The left ventricle has no regional wall motion abnormalities. Definity contrast agent was given IV to delineate the left ventricular  endocardial borders. The left ventricular internal cavity size was normal in size. There is moderate left ventricular hypertrophy of the basal-septal segment. Left ventricular diastolic parameters are indeterminate. Right Ventricle: The  right ventricular size is normal. No increase in right ventricular wall thickness. Right ventricular systolic function is normal. There is normal pulmonary artery systolic pressure. The tricuspid regurgitant velocity is 2.52 m/s, and  with an assumed right atrial pressure of 8 mmHg, the estimated right ventricular systolic pressure is 27.0 mmHg. Left Atrium: Left atrial size was normal in size. Right Atrium: Right atrial size was normal in size. Pericardium: There is no evidence of pericardial effusion. Mitral Valve: The mitral valve is normal in structure. No evidence of mitral valve regurgitation. No evidence of mitral valve stenosis. Tricuspid Valve: The tricuspid valve is normal in structure. Tricuspid valve regurgitation is moderate . No evidence of tricuspid stenosis. Aortic Valve: The aortic valve is tricuspid. Aortic valve regurgitation is mild. No aortic stenosis is present. Pulmonic Valve: The pulmonic valve was normal in structure. Pulmonic valve regurgitation is moderate. No evidence of pulmonic stenosis. Aorta: The aortic root is normal in size and structure. Venous: The inferior vena cava is normal in size with greater than 50% respiratory variability, suggesting right atrial pressure of 3 mmHg. IAS/Shunts: No atrial level shunt detected by color flow Doppler.  LEFT VENTRICLE PLAX 2D LVIDd:         3.95 cm     Diastology LVIDs:         2.70 cm     LV e' medial:    8.11 cm/s LV PW:         0.90 cm     LV E/e' medial:  11.1 LV  IVS:        1.65 cm     LV e' lateral:   7.90 cm/s LVOT diam:     1.70 cm     LV E/e' lateral: 11.4 LV SV:         41 LV SV Index:   22 LVOT Area:     2.27 cm  LV Volumes (MOD) LV vol d, MOD A2C: 67.7 ml LV vol d, MOD A4C: 76.6 ml LV vol s, MOD A2C: 27.9 ml LV vol s, MOD A4C: 32.5 ml LV SV MOD A2C:     39.8 ml LV SV MOD A4C:     76.6 ml LV SV MOD BP:      43.2 ml RIGHT VENTRICLE RV S prime:     9.14 cm/s TAPSE (M-mode): 1.1 cm LEFT ATRIUM             Index        RIGHT ATRIUM           Index LA diam:        4.20 cm 2.19 cm/m   RA Area:     15.00 cm LA Vol (A2C):   57.1 ml 29.74 ml/m  RA Volume:   32.70 ml  17.03 ml/m LA Vol (A4C):   51.6 ml 26.88 ml/m LA Biplane Vol: 55.7 ml 29.01 ml/m  AORTIC VALVE             PULMONIC VALVE LVOT Vmax:   75.00 cm/s  PR End Diast Vel: 1.96 msec LVOT Vmean:  55.200 cm/s LVOT VTI:    0.182 m  AORTA Ao Root diam: 2.50 cm Ao Asc diam:  3.30 cm MITRAL VALVE               TRICUSPID VALVE MV Area (PHT): 3.90 cm    TR Peak grad:   25.4 mmHg MV Decel Time: 195 msec    TR Vmax:  252.00 cm/s MV E velocity: 90.30 cm/s                            SHUNTS                            Systemic VTI:  0.18 m                            Systemic Diam: 1.70 cm Candee Furbish MD Electronically signed by Candee Furbish MD Signature Date/Time: 12/10/2021/12:23:29 PM    Final     PHYSICAL EXAM  Temp:  [98.1 F (36.7 C)-101 F (38.3 C)] 98.3 F (36.8 C) (10/09 0800) Pulse Rate:  [62-93] 73 (10/09 1000) Resp:  [16-27] 17 (10/09 1000) BP: (100-142)/(54-86) 129/73 (10/09 1000) SpO2:  [95 %-100 %] 97 % (10/09 1000)  General -pleasant  elderly female, intubated Well nourished, well developed, in no apparent distress.  Cardiovascular - irregular- ? A fib   Mental Status -  Awake alert and interactive.  Speech is slightly nonfluent without dysarthria.  Follows commands well..  Extraocular movements are full range pupils equal and reactive.  Blinks to threat bilaterally slight right lower  facial weakness.  Tongue midline. Motor Strength - Mild right hemiparesis 4+/5 with weakness predominantly of right grip and intrinsic hand muscles.  Trace weakness of right hip flexors and ankle dorsiflexors.  Good strength on the left Motor Tone - Muscle tone was assessed at the neck and appendages and was normal.  Sensory - decreased on right arm    Coordination - unable to assess   Gait and Station - deferred.   ASSESSMENT/PLAN Ms. BHAKTI LABELLA is a 81 y.o. female with history of  colon cancer, hypercholesterolemia, essential hypertension, obesity with a BMI of 34.84 kg/m, and remote CVA who presented to [facility] on 12/09/2021 via EMS as a transfer from [facility] ED for neuro IR after vessel imaging at [place] revealed a left M1 MCA occlusion.  Patient was in her usual state of health today at 13:00. Patient was given TNK and was determined to be a candidate for VIR for which transfer to [facility] was initiated.   Stroke:  Acute Left MCA ischemic infarct s/p TNK; S/p revascularization of Left M1 occlusion with TICI 3 Etiology:  likely due to new onset A fib   Code Stroke CT head  1. No acute intracranial abnormality or significant interval change. 2. Stable remote infarcts of the right corona radiata, right caudate head, and inferior left cerebellum. 3. Stable atrophy and white matter disease. This likely reflects the sequela of chronic microvascular ischemia. 4. Aspects is 10/10. CTA head & neck  1. Proximal left M1 occlusion with limited collaterals within the anterior aspect of the left MCA distribution. 2. Moderate stenosis of the right M1 segment. 3. Segmental narrowing within distal right MCA branch vessels without a significant proximal stenosis or occlusion. 4. Moderate stenosis of the proximal right P2 segment. 5. 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 It is occluded again at the C2-3 level. 6. High-grade stenoses of the  proximal internal carotid arteries bilaterally at the carotid bifurcations. 7. Moderate stenosis at the origin of the right vertebral artery Cerebral angio  Occluded left middle cerebral artery M1 segment Post IR CT No evidence of intracranial hemorrhage. MRI  12/10/21  1. Acute/subacute nonhemorrhagic infarcts involving the left lentiform nucleus and caudate. 2. Acute/subacute nonhemorrhagic infarct involving the posterior left insular cortex. 3. The remainder of the left MCA territory is spared. 4. Subarachnoid hemorrhage in the left parietal lobe. 5. Stable atrophy and white matter disease elsewhere likely reflects the sequela of chronic microvascular ischemia. 2D Echo EF 60-65%. moderate left ventricular hypertrophy of the basal-septal segment. No shunt EKG with A fib  LDL 131 HgbA1c 5.7 VTE prophylaxis - SCD's    Diet   Diet NPO time specified   aspirin 81 mg daily and clopidogrel 75 mg daily prior to admission, now on No antithrombotic. Will start aspirin  if no hemorrhage on 24 hr post TNK brain imaging. Will need to consider starting [facility]  Therapy recommendations:  possible CIR candidate - currently being reviewed Disposition:  pending   Hypertension Hypotension  Home meds:  amlodipine, verapamil  UnStable Was on Neo gtt @ 74mg now off Long-term BP goal normotensive  Hyperlipidemia Home meds:  crestor '10mg'$ , increased to '40mg'$ , resumed in hospital LDL 131, goal < 70 High intensity statin not indicated  Continue statin at discharge  New onset A fib EKG with A fib Cardiology consult  [facility]   Acute respiratory Failure, resolved Management per CCM   Dysphagia  NPO Pt did not pass previous swallow evaluation.  Speech to recheck today 10/9. If pt fails again will need to consider tube feedings.  Other Stroke Risk Factors Advanced Age >/= 621 Obesity, Body mass index is 34.84 kg/m., BMI >/= 30 associated with increased stroke risk,  recommend weight loss, diet and exercise as appropriate  Hx stroke/TIA  Other Active Problems and Issues Anxiety  Anemia Hgb drifting down - 12.4->11.2->10.9 (will recheck in AM) CCM following - appreciate assistance  IR following - appreciate assistance Transfer to telemetry floor today.  Hospital day # 2  Per RN note - 12/10/2021  8:24 PM "MD KLeonel Ramsaycontacted by this RN as patient has not received aspirin after 24 hour post TNK MRI was completed. Patient has failed swallow screen, however rectal aspirin may be given. Due to bleeding that was revealed on the imaging, verbal orders given to hold off on aspirin administration until tomorrow.  Verbal order from MD KLeonel Ramsaythat BP parameters may also be loosened to SBP <150 as patient is post-24 hours from procedure"  Max BP for 12/11/21 - 0200 - 142/71 Extubated per Respiratory Therapy - 12/10/2021 10:46 AM Possible CIR candidate - patient being evaluated   Patient presented with aphasia and right hemiparesis due to left M1 occlusion likely from new onset A-fib and was treated with IV thrombolysis with TNK followed by successful mechanical thrombectomy with TICI 3 revascularization.  He is making good neurological improvement with only mild right-sided weakness remaining..   .  Start aspirin if no hemorrhage on repeat CT head tomorrow morning d will need to transition to anticoagulation with Eliquis when she is able to swallow .  Cardiology consult for new onset A-fib.  Mobilize out of bed.  Therapy consults.  Speech therapy for swallow eval.  No family available at the bedside during rounds.  Transfer to neurology floor bed with telemetry monitoring .This patient is critically ill and at significant risk of neurological worsening, death and care requires constant monitoring of vital signs, hemodynamics,respiratory and cardiac monitoring, extensive review of multiple databases, frequent neurological assessment, discussion with family, other  specialists and medical decision making of high complexity.I have made  any additions or clarifications directly to the above note.This critical care time does not reflect procedure time, or teaching time or supervisory time of PA/NP/Med Resident etc but could involve care discussion time.  I spent 30 minutes of neurocritical care time  in the care of  this patient.    Antony Contras, MD       To contact Stroke Continuity provider, please refer to [email or web address] After hours, contact General Neurology

## 2021-12-11 NOTE — Evaluation (Signed)
Occupational Therapy Evaluation Patient Details Name: Janice Curtis MRN: [id number] DOB: 09-20-1940 Today's Date: 12/11/2021   History of Present Illness 81 y.o. female admitted on 12/09/21 for R sided weakness, aphasia.  Dx with L M1 MCA stroke s/p TNK and IR for thrombectomy.  Intubated for IR procedure on 10/7 and extubated 10/8.  Pt with significant PMH of colon CA s/p colectomy, HTN, obesity, stroke (2011).   Clinical Impression   PT admitted with L MCA with aphasia. Pt currently with functional limitiations due to the deficits listed below (see OT problem list). Pt currently with R UE / LE weakness. Pt with narrowed base of support and strong R lean.  Pt with poor visual pursuits and needs further visual assessment in functional task. Pt will benefit from skilled OT to increase their independence and safety with adls and balance to allow discharge CIR.       Recommendations for follow up therapy are one component of a multi-disciplinary discharge planning process, led by the attending physician.  Recommendations may be updated based on patient status, additional functional criteria and insurance authorization.   Follow Up Recommendations  Acute inpatient rehab (3hours/day)    Assistance Recommended at Discharge Intermittent Supervision/Assistance  Patient can return home with the following Two people to help with walking and/or transfers;Two people to help with bathing/dressing/bathroom    Functional Status Assessment  Patient has had a recent decline in their functional status and demonstrates the ability to make significant improvements in function in a reasonable and predictable amount of time.  Equipment Recommendations  BSC/3in1;Wheelchair (measurements OT);Wheelchair cushion (measurements OT)    Recommendations for Other Services Rehab consult     Precautions / Restrictions Precautions Precautions: Fall Precaution Comments: R side weakness Restrictions Weight Bearing  Restrictions: No      Mobility Bed Mobility Overal bed mobility: Needs Assistance Bed Mobility: Supine to Sit     Supine to sit: Min assist, HOB elevated     General bed mobility comments: pt with min (A) to exit the bed on the right with very elevated HOB > 45 degrees    Transfers Overall transfer level: Needs assistance Equipment used: Rolling walker (2 wheels) Transfers: Sit to/from Stand Sit to Stand: +2 physical assistance, Mod assist, From elevated surface           General transfer comment: cues for hand placement and safety      Balance Overall balance assessment: Needs assistance Sitting-balance support: Bilateral upper extremity supported, Feet supported Sitting balance-Leahy Scale: Fair Sitting balance - Comments: right lean   Standing balance support: Bilateral upper extremity supported, During functional activity, Reliant on assistive device for balance Standing balance-Leahy Scale: Poor                             ADL either performed or assessed with clinical judgement   ADL Overall ADL's : Needs assistance/impaired Eating/Feeding: NPO   Grooming: Oral care;Minimal assistance;Bed level Grooming Details (indicate cue type and reason): hand over hand to suction mouth Upper Body Bathing: Maximal assistance   Lower Body Bathing: Maximal assistance   Upper Body Dressing : Maximal assistance   Lower Body Dressing: Maximal assistance   Toilet Transfer: +2 for physical assistance;Moderate assistance;BSC/3in1;Rolling walker (2 wheels)           Functional mobility during ADLs: +2 for physical assistance;Moderate assistance;Rolling walker (2 wheels) General ADL Comments: pt with narrowed base of support and needs physical (A)  to correct     Vision Baseline Vision/History: 0 No visual deficits Vision Assessment?: Yes Eye Alignment: Impaired (comment) Ocular Range of Motion: Within Functional Limits Alignment/Gaze Preference: Gaze  left Tracking/Visual Pursuits: Decreased smoothness of vertical tracking;Impaired - to be further tested in functional context Convergence: Impaired (comment) Depth Perception: Undershoots Additional Comments: pt does not converge with testing. pt able to track in R and L field with R eye moving separte of L eye at times. pt denies diplopia.     Perception     Praxis      Pertinent Vitals/Pain       Hand Dominance Right   Extremity/Trunk Assessment Upper Extremity Assessment Upper Extremity Assessment: RUE deficits/detail RUE Deficits / Details: pt able to hold against gravity to 90 degrees and sustain. pt able to with increased time and effort touch nose. Pt able to touch thumb to 5th digit pad with increased time. pt able to show two fingers RUE Sensation: decreased light touch RUE Coordination: decreased fine motor;decreased gross motor   Lower Extremity Assessment Lower Extremity Assessment: Defer to PT evaluation   Cervical / Trunk Assessment Cervical / Trunk Assessment: Normal   Communication Communication Communication: Expressive difficulties   Cognition Arousal/Alertness: Awake/alert Behavior During Therapy: Flat affect Overall Cognitive Status: Difficult to assess                                 General Comments: following commands. oriented to self and location. unable to verbalize reason for admission.     General Comments  RA VSS    Exercises     Shoulder Instructions      Home Living Family/patient expects to be discharged to:: Private residence Living Arrangements: Children Available Help at Discharge: Family;Available PRN/intermittently Type of Home: House Home Access: Level entry     Home Layout: One level     Bathroom Shower/Tub: Occupational psychologist: Handicapped height     Home Equipment: Grab bars - tub/shower   Additional Comments: information obtained from patient during evaluation. No family present to  verify at this time. pt verbalized okay to call family to verfiy information      Prior Functioning/Environment                 ADLs Comments: unknown at this time. pt very vague in response        OT Problem List: Decreased strength;Decreased activity tolerance;Impaired balance (sitting and/or standing);Decreased cognition;Decreased safety awareness;Decreased knowledge of use of DME or AE;Decreased knowledge of precautions;Cardiopulmonary status limiting activity;Obesity;Impaired UE functional use      OT Treatment/Interventions: Self-care/ADL training;Therapeutic exercise;Neuromuscular education;DME and/or AE instruction;Manual therapy;Modalities;Energy conservation;Therapeutic activities;Cognitive remediation/compensation;Visual/perceptual remediation/compensation;Patient/family education;Balance training    OT Goals(Current goals can be found in the care plan section) Acute Rehab OT Goals Patient Stated Goal: none stated OT Goal Formulation: Patient unable to participate in goal setting Time For Goal Achievement: 12/25/21 Potential to Achieve Goals: Good  OT Frequency: Min 2X/week    Co-evaluation PT/OT/SLP Co-Evaluation/Treatment: Yes Reason for Co-Treatment: Complexity of the patient's impairments (multi-system involvement);For patient/therapist safety;Necessary to address cognition/behavior during functional activity;To address functional/ADL transfers   OT goals addressed during session: ADL's and self-care;Proper use of Adaptive equipment and DME;Strengthening/ROM      AM-PAC OT "6 Clicks" Daily Activity     Outcome Measure Help from another person eating meals?: A Lot Help from another person taking care of personal grooming?: A Lot  Help from another person toileting, which includes using toliet, bedpan, or urinal?: A Lot Help from another person bathing (including washing, rinsing, drying)?: A Lot Help from another person to put on and taking off regular upper  body clothing?: A Lot Help from another person to put on and taking off regular lower body clothing?: A Lot 6 Click Score: 12   End of Session Equipment Utilized During Treatment: Gait belt;Rolling walker (2 wheels) Nurse Communication: Mobility status;Precautions  Activity Tolerance: Patient tolerated treatment well Patient left: in chair;with call bell/phone within reach;with chair alarm set  OT Visit Diagnosis: Unsteadiness on feet (R26.81);Muscle weakness (generalized) (M62.81);Hemiplegia and hemiparesis                Time: 2197-5883 OT Time Calculation (min): 22 min Charges:  OT General Charges $OT Visit: 1 Visit OT Evaluation $OT Eval Moderate Complexity: 1 Mod   Brynn, OTR/L  Acute Rehabilitation Services Office: [phone] .   Jeri Modena 12/11/2021, 2:50 PM

## 2021-12-11 NOTE — Evaluation (Signed)
Speech Language Pathology Evaluation Patient Details Name: Janice Curtis MRN: [id number] DOB: [birth year] Today's Date: 12/11/2021 Time: 9811-9147 SLP Time Calculation (min) (ACUTE ONLY): 21 min  Problem List:  Patient Active Problem List   Diagnosis Date Noted   Acute ischemic stroke ([place]) 12/09/2021   Middle cerebral artery embolism, left 12/09/2021   CVA (cerebral vascular accident) ([place]) 10/20/2020   VITAMIN D DEFICIENCY 12/08/2008   HYPERCHOLESTEROLEMIA 12/08/2008   HYPERTENSION 12/08/2008   History of malignant neoplasm of large intestine 12/08/2008   HYPOKALEMIA, HX OF 12/08/2008   ANEMIA, IRON DEFICIENCY, HX OF 12/08/2008   Past Medical History:  Past Medical History:  Diagnosis Date   Anxiety    Colon cancer ([place])    Hypercholesterolemia    Hypertension    Obesity    Stroke ([place]) 12/05/2009   Past Surgical History:  Past Surgical History:  Procedure Laterality Date   COLONOSCOPY  12/27/08   simple adenoma/inflammatory polyp at anastomosis   COLONOSCOPY  04/23/2011   Procedure: COLONOSCOPY;  Surgeon: Dorothyann Peng, MD;  Location: [facility];  Service: Endoscopy;  Laterality: N/A;  10:00   RADIOLOGY WITH ANESTHESIA N/A 12/09/2021   Procedure: RADIOLOGY WITH ANESTHESIA;  Surgeon: Radiologist, Medication, MD;  Location: [place];  Service: Radiology;  Laterality: N/A;   RIGHT COLECTOMY  11/14/2007   HPI:  Pt is a 81 y.o. female medical history significant for colon cancer, hypercholesterolemia, essential hypertension, obesity, and remote CVA (2011) who presented to [facility] 12[facility]09[facility]2021 from [facility] ED for neuro IR after vessel imaging at [place] revealed a left M1 MCA occlusion.  Pt was found to have findings consistent with a left MCA territory infarction. MRI head found acute/subacute nonhemorrhagic infarcts involving the left lentiform nucleus caudate, posterior left insular cortex, and a subarachnoid hemorrhage in the left parietal lobe.   Assessment /  Plan / Recommendation Clinical Impression   Pt was alert and cooperative throughout encounter. Pt's speech intelligibility is significantly reduced at the word level by low vocal intensity, however, no other vocal abnormalities noted. A cognitive linguistic screener was administered to further assess specific deficits s/p stroke. A significant deficit in generative naming noted as pt was unable to name any objects in a category given a minute. A minor difficulty in confrontational naming noted as pt named 5/7 environmental objects. Pt verbally perseverated on previous answers throughout naming tasks. Pt's auditory compression appears intact as she accurately answered all simple and abstract yes/no questions. Repetition abilities through the phrase level, automatic utterances such as counting, command following, and basic problem solving also present as intact. Pt required significant processing time for when tasked with UE movements, potentially indicating a motor planning deficit. Pt's performance throughout session are consistent with moderate dysarthria and expressive aphasia. ST will follow for speech/language tx.    SLP Assessment  SLP Recommendation/Assessment: Patient needs continued Speech Lanaguage Pathology Services SLP Visit Diagnosis: Aphasia (R47.01);Dysarthria and anarthria (R47.1)    Recommendations for follow up therapy are one component of a multi-disciplinary discharge planning process, led by the attending physician.  Recommendations may be updated based on patient status, additional functional criteria and insurance authorization.    Follow Up Recommendations  Skilled nursing-short term rehab (<3 hours/day)    Assistance Recommended at Discharge  Intermittent Supervision/Assistance  Functional Status Assessment Patient has had a recent decline in their functional status and demonstrates the ability to make significant improvements in function in a reasonable and predictable amount  of time.  Frequency  and Duration min 2x/week  2 weeks      SLP Evaluation Cognition  Overall Cognitive Status: Difficult to assess Arousal/Alertness: Awake/alert Orientation Level: Oriented to person;Disoriented to place;Disoriented to time Day of Week: Correct Attention: Focused Focused Attention: Appears intact Memory: Impaired Memory Impairment: Retrieval deficit;Decreased recall of new information Awareness: Appears intact Problem Solving: Appears intact       Comprehension  Auditory Comprehension Overall Auditory Comprehension: Appears within functional limits for tasks assessed Yes/No Questions: Within Functional Limits Commands: Within Functional Limits Conversation: Simple Interfering Components: Processing speed;Motor planning EffectiveTechniques: Slowed speech;Stressing words Visual Recognition/Discrimination Discrimination: Not tested Reading Comprehension Reading Status: Not tested    Expression Expression Primary Mode of Expression: Verbal Verbal Expression Overall Verbal Expression: Impaired Initiation: Impaired Automatic Speech: Day of week Level of Generative/Spontaneous Verbalization: Word Repetition: No impairment Naming: Impairment Responsive: Not tested Confrontation: Impaired Convergent: Not tested Divergent: 0-24% accurate Verbal Errors: Perseveration Pragmatics: Unable to assess Interfering Components: Speech intelligibility Effective Techniques: Phonemic cues;Semantic cues Written Expression Dominant Hand: Right Written Expression: Not tested   Oral / Motor  Oral Motor/Sensory Function Overall Oral Motor/Sensory Function: Mild impairment Facial ROM: Within Functional Limits Facial Symmetry: Within Functional Limits Facial Strength: Within Functional Limits Lingual ROM: Reduced right;Suspected CN XII (hypoglossal) dysfunction (Potential motor planning deficits) Lingual Symmetry: Within Functional Limits Lingual Strength:  Reduced;Suspected CN XII (hypoglossal) dysfunction (Potential motor planning deficits) Velum: Within Functional Limits Motor Speech Overall Motor Speech: Impaired Respiration: Within functional limits Phonation: Low vocal intensity Resonance: Within functional limits Articulation: Impaired Level of Impairment: Word Intelligibility: Intelligibility reduced Word: 50-74% accurate Phrase: 25-49% accurate Sentence: 25-49% accurate Conversation: Not tested Motor Planning: Not tested Motor Speech Errors: [place] SLP  12/11/2021, 1:16 PM

## 2021-12-11 NOTE — Progress Notes (Signed)
Physical Therapy Treatment Patient Details Name: Janice Curtis MRN: [id number] DOB: Mar 06, 1940 Today's Date: 12/11/2021   History of Present Illness 81 y.o. female admitted on 12/09/21 for R sided weakness, aphasia.  Dx with L M1 MCA stroke s/p TNK and IR for thrombectomy.  Intubated for IR procedure on 10/7 and extubated 10/8.  Pt with significant PMH of colon CA s/p colectomy, HTN, obesity, stroke (2011).    PT Comments    The pt was agreeable to session, maintains flat affect this afternoon and intermittently giving inconsistent answers to open-ended questions. However, the pt was able to progress to sitting EOB with less assist and progress to completing sit-stand transfer and short bout of gait in the room with modA of 2. The pt needs assist to rise to standing and continued cues for positioning of feet to increase BOS, assist to advance RLE with gait and progressively increased trunk support with OOB mobility. Will continue to benefit from skilled PT acutely as well as acute inpatient rehab after d/c to maximize functional recovery and return to independence.     Recommendations for follow up therapy are one component of a multi-disciplinary discharge planning process, led by the attending physician.  Recommendations may be updated based on patient status, additional functional criteria and insurance authorization.  Follow Up Recommendations  Acute inpatient rehab (3hours/day)     Assistance Recommended at Discharge Frequent or constant Supervision/Assistance  Patient can return home with the following A lot of help with walking and/or transfers;A lot of help with bathing/dressing/bathroom;Assistance with cooking/housework;Assistance with feeding;Direct supervision/assist for financial management;Direct supervision/assist for medications management;Assist for transportation;Help with stairs or ramp for entrance   Equipment Recommendations  Other (comment) (TBD as pt progresses and family  is here to report equipment already owned.)    Recommendations for Other Services       Precautions / Restrictions Precautions Precautions: Fall Precaution Comments: R side weakness Restrictions Weight Bearing Restrictions: No     Mobility  Bed Mobility Overal bed mobility: Needs Assistance Bed Mobility: Supine to Sit     Supine to sit: Min assist, HOB elevated     General bed mobility comments: minA to exit bed on R with HOB >45 deg. increased time and cues for movement of all extremities    Transfers Overall transfer level: Needs assistance Equipment used: Rolling walker (2 wheels) Transfers: Sit to/from Stand Sit to Stand: +2 physical assistance, Mod assist, From elevated surface           General transfer comment: cues for hand placement and safety    Ambulation/Gait Ambulation/Gait assistance: Mod assist, +2 physical assistance Gait Distance (Feet): 6 Feet Assistive device: Rolling walker (2 wheels) Gait Pattern/deviations: Step-to pattern, Decreased step length - right, Decreased stance time - right, Staggering right Gait velocity: decreased Gait velocity interpretation: <1.31 ft/sec, indicative of household ambulator   General Gait Details: leaning to R and needing progressively more assist as pt unable to clear RLE or correct posture. is able to hold RW with RUE     Modified Rankin (Stroke Patients Only) Modified Rankin (Stroke Patients Only) Pre-Morbid Rankin Score: No symptoms Modified Rankin: Moderately severe disability     Balance Overall balance assessment: Needs assistance Sitting-balance support: Bilateral upper extremity supported, Feet supported Sitting balance-Leahy Scale: Fair Sitting balance - Comments: right lean Postural control: Right lateral lean Standing balance support: Bilateral upper extremity supported, During functional activity, Reliant on assistive device for balance Standing balance-Leahy Scale: Poor  Cognition Arousal/Alertness: Awake/alert Behavior During Therapy: Flat affect Overall Cognitive Status: Difficult to assess                                 General Comments: following commands. oriented to self and location. unable to verbalize reason for admission.        Exercises      General Comments General comments (skin integrity, edema, etc.): VSS on RA      Pertinent Vitals/Pain Pain Assessment Pain Assessment: Faces Faces Pain Scale: No hurt Pain Intervention(s): Monitored during session     PT Goals (current goals can now be found in the care plan section) Acute Rehab PT Goals Patient Stated Goal: unable to state PT Goal Formulation: Patient unable to participate in goal setting Time For Goal Achievement: 12/24/21 Potential to Achieve Goals: Good Progress towards PT goals: Progressing toward goals    Frequency    Min 4X/week      PT Plan Current plan remains appropriate    Co-evaluation PT/OT/SLP Co-Evaluation/Treatment: Yes Reason for Co-Treatment: Complexity of the patient's impairments (multi-system involvement);Necessary to address cognition/behavior during functional activity;To address functional/ADL transfers;For patient/therapist safety PT goals addressed during session: Mobility/safety with mobility;Balance;Proper use of DME;Strengthening/ROM        AM-PAC PT "6 Clicks" Mobility   Outcome Measure  Help needed turning from your back to your side while in a flat bed without using bedrails?: A Little Help needed moving from lying on your back to sitting on the side of a flat bed without using bedrails?: A Lot Help needed moving to and from a bed to a chair (including a wheelchair)?: A Lot Help needed standing up from a chair using your arms (e.g., wheelchair or bedside chair)?: A Lot Help needed to walk in hospital room?: Total Help needed climbing 3-5 steps with a railing? : Total 6 Click Score: 11     End of Session Equipment Utilized During Treatment: Gait belt;Oxygen Activity Tolerance: Patient tolerated treatment well Patient left: in chair;with call bell/phone within reach;with chair alarm set Nurse Communication: Mobility status PT Visit Diagnosis: Muscle weakness (generalized) (M62.81);Difficulty in walking, not elsewhere classified (R26.2);Hemiplegia and hemiparesis Hemiplegia - Right/Left: Right Hemiplegia - dominant/non-dominant: Dominant Hemiplegia - caused by: Cerebral infarction     Time: 1213-1236 PT Time Calculation (min) (ACUTE ONLY): 23 min  Charges:  $Therapeutic Exercise: 8-22 mins                     West Carbo, PT, DPT   Acute Rehabilitation Department   Sandra Cockayne 12/11/2021, 4:48 PM

## 2021-12-12 ENCOUNTER — Inpatient Hospital Stay (HOSPITAL_COMMUNITY): Payer: Medicare HMO

## 2021-12-12 DIAGNOSIS — I639 Cerebral infarction, unspecified: Secondary | ICD-10-CM | POA: Diagnosis not present

## 2021-12-12 DIAGNOSIS — I4891 Unspecified atrial fibrillation: Secondary | ICD-10-CM | POA: Diagnosis not present

## 2021-12-12 LAB — CBC
HCT: 32.5 % — ABNORMAL LOW (ref 36.0–46.0)
Hemoglobin: 10.3 g/dL — ABNORMAL LOW (ref 12.0–15.0)
MCH: 26.5 pg (ref 26.0–34.0)
MCHC: 31.7 g/dL (ref 30.0–36.0)
MCV: 83.5 fL (ref 80.0–100.0)
Platelets: 148 10*3/uL — ABNORMAL LOW (ref 150–400)
RBC: 3.89 MIL/uL (ref 3.87–5.11)
RDW: 14.4 % (ref 11.5–15.5)
WBC: 6.1 10*3/uL (ref 4.0–10.5)
nRBC: 0 % (ref 0.0–0.2)

## 2021-12-12 LAB — BASIC METABOLIC PANEL
Anion gap: 8 (ref 5–15)
BUN: 8 mg/dL (ref 8–23)
CO2: 24 mmol/L (ref 22–32)
Calcium: 8.5 mg/dL — ABNORMAL LOW (ref 8.9–10.3)
Chloride: 105 mmol/L (ref 98–111)
Creatinine, Ser: 0.94 mg/dL (ref 0.44–1.00)
GFR, Estimated: >60 mL/min (ref >60)
Glucose, Bld: 97 mg/dL (ref 70–99)
Potassium: 3.6 mmol/L (ref 3.5–5.1)
Sodium: 137 mmol/L (ref 135–145)

## 2021-12-12 LAB — TSH: TSH: 2.021 u[IU]/mL (ref 0.350–4.500)

## 2021-12-12 MED ORDER — CHLORHEXIDINE GLUCONATE CLOTH 2 % EX PADS
6.0000 | MEDICATED_PAD | Freq: Every day | CUTANEOUS | Status: DC
  Administered 2021-12-12 – 2021-12-15 (×2): 6 via TOPICAL

## 2021-12-12 NOTE — Progress Notes (Signed)
Physical Therapy Treatment Patient Details Name: Janice Curtis MRN: [id number] DOB: Jan 19, 1941 Today's Date: 12/12/2021   History of Present Illness 81 y.o. female admitted on 12/09/21 for R sided weakness, aphasia.  Dx with L M1 MCA stroke s/p TNK and IR for thrombectomy.  Intubated for IR procedure on 10/7 and extubated 10/8.  Pt with significant PMH of colon CA s/p colectomy, HTN, obesity, stroke (2011).    PT Comments    Pt making gradual progress.  She continues to have flat affect but was also somewhat impulsive requiring cues to wait for therapist.  Pt able to answer basic questions but with few words throughout session.  Decreased awareness of R sided deficits.  Had assist of 2 for transfers for safety, needed mod A of 2 for gait and chair follow.  However, gait and standing activity limited due to constant loose stool despite sitting on BSC several mins.  Cont plan of care.   Recommendations for follow up therapy are one component of a multi-disciplinary discharge planning process, led by the attending physician.  Recommendations may be updated based on patient status, additional functional criteria and insurance authorization.  Follow Up Recommendations  Acute inpatient rehab (3hours/day)     Assistance Recommended at Discharge Frequent or constant Supervision/Assistance  Patient can return home with the following A lot of help with walking and/or transfers;A lot of help with bathing/dressing/bathroom;Assistance with cooking/housework;Assistance with feeding;Direct supervision/assist for financial management;Direct supervision/assist for medications management;Assist for transportation;Help with stairs or ramp for entrance   Equipment Recommendations  Other (comment) (possible RW and w/c; defer to post acute for further progress/assessment)    Recommendations for Other Services Rehab consult     Precautions / Restrictions Precautions Precautions: Fall Precaution Comments: R  side weakness     Mobility  Bed Mobility Overal bed mobility: Needs Assistance Bed Mobility: Supine to Sit     Supine to sit: Mod assist     General bed mobility comments: Pt starting well with cues to sit EOB (moved legs and sat up trunk), but needed assistance to finish moving R LE and scooting forward    Transfers Overall transfer level: Needs assistance Equipment used: Rolling walker (2 wheels) Transfers: Sit to/from Stand, Bed to chair/wheelchair/BSC Sit to Stand: Mod assist, From elevated surface, +2 safety/equipment   Step pivot transfers: Mod assist, +2 safety/equipment       General transfer comment: Stood from bed x 2 and bsc x 3; mod cues to wait till therapist ready; Had assist of 2 for safety but pt able to rise with assist of 1.  Stand pivot from [facility] to recliner with mod of 2 (again +2 for safety)    Ambulation/Gait Ambulation/Gait assistance: Mod assist, +2 physical assistance Gait Distance (Feet): 3 Feet Assistive device: Rolling walker (2 wheels) Gait Pattern/deviations: Step-to pattern, Decreased step length - right, Decreased stance time - right, Staggering right Gait velocity: decreased     General Gait Details: Pt heavily leaning to R and needing progressively more assist as pt unable to clear RLE or correct posture.  She is able to hold RW with RUE.  Further gait limited due to loose stools   Stairs             Wheelchair Mobility    Modified Rankin (Stroke Patients Only) Modified Rankin (Stroke Patients Only) Pre-Morbid Rankin Score: No symptoms Modified Rankin: Moderately severe disability     Balance Overall balance assessment: Needs assistance Sitting-balance support: Feet supported, No upper extremity  supported Sitting balance-Leahy Scale: Fair Sitting balance - Comments: Able to static sit without lean   Standing balance support: Bilateral upper extremity supported, During functional activity, Reliant on assistive device for  balance Standing balance-Leahy Scale: Poor Standing balance comment: Requiring RW and mod A static stand due to R lean                            Cognition Arousal/Alertness: Awake/alert Behavior During Therapy: Flat affect, Impulsive Overall Cognitive Status: Difficult to assess Area of Impairment: Awareness, Safety/judgement, Following commands                       Following Commands: Follows one step commands inconsistently Safety/Judgement: Decreased awareness of safety, Decreased awareness of deficits Awareness: Intellectual   General Comments: Pt oriented to self and location, answers basic question, but very flat affect and few words during session. Also, impulsive and starting before therapist ready.  Decreased awareness of R deficits        Exercises General Exercises - Lower Extremity Ankle Circles/Pumps: AROM, Both, 10 reps, Seated Quad Sets: AROM, Both, 10 reps, Seated (max cues) Long Arc Quad: AROM, Right, 10 reps, Seated (graded resistance concentric and eccentric phase; max cues to push/pull into therapist hand) Hip Flexion/Marching: AROM, Both, 10 reps, Seated Other Exercises Other Exercises: pillow squeeze hip add x 10    General Comments        Pertinent Vitals/Pain Pain Assessment Pain Assessment: No/denies pain    Home Living                          Prior Function            PT Goals (current goals can now be found in the care plan section) Progress towards PT goals: Progressing toward goals    Frequency    Min 4X/week      PT Plan Current plan remains appropriate    Co-evaluation              AM-PAC PT "6 Clicks" Mobility   Outcome Measure  Help needed turning from your back to your side while in a flat bed without using bedrails?: A Little Help needed moving from lying on your back to sitting on the side of a flat bed without using bedrails?: A Lot Help needed moving to and from a bed to a  chair (including a wheelchair)?: A Lot Help needed standing up from a chair using your arms (e.g., wheelchair or bedside chair)?: A Lot Help needed to walk in hospital room?: Total Help needed climbing 3-5 steps with a railing? : Total 6 Click Score: 11    End of Session Equipment Utilized During Treatment: Gait belt Activity Tolerance: Patient tolerated treatment well;Other (comment) (Limited due to loose stools (cleaned several times, sat on BSC, no improvement)) Patient left: in chair;with call bell/phone within reach;with chair alarm set Nurse Communication: Mobility status;Other (comment) (notified nurse tech of loose ongoing stool) PT Visit Diagnosis: Muscle weakness (generalized) (M62.81);Difficulty in walking, not elsewhere classified (R26.2);Hemiplegia and hemiparesis Hemiplegia - Right/Left: Right Hemiplegia - dominant/non-dominant: Dominant Hemiplegia - caused by: Cerebral infarction     Time: 8657-8469 PT Time Calculation (min) (ACUTE ONLY): 26 min  Charges:  $Therapeutic Exercise: 8-22 mins $Therapeutic Activity: 8-22 mins                     Dmauri Rosenow,  PT Acute Rehab Services [facility] [phone]    Karlton Lemon 12/12/2021, 4:07 PM

## 2021-12-12 NOTE — Progress Notes (Signed)
STROKE TEAM PROGRESS NOTE   INTERVAL HISTORY  Patient is awake alert and interactive.  Patient aphasia and right hemiparesis continues to improve.  Therapy recommends inpatient rehab  Vital signs are stable.  Blood pressure adequately controlled.  She has new onset A-fib but rate appears to be controlled.  Follow-up CT scan from this morning still shows some persistent but decreasing subarachnoid hemorrhage.  She has been started on aspirin        Vitals:   12/11/21 2332 12/12/21 0427 12/12/21 0847 12/12/21 1141  BP: (!) 156/84 (!) 157/74 (!) 167/85 (!) 147/72  Pulse: 83 71 76 68  Resp: '18 16 14 14  '$ Temp: (!) 97.5 F (36.4 C) 98 F (36.7 C) 98.9 F (37.2 C) 98.6 F (37 C)  TempSrc: Axillary Oral Oral Oral  SpO2: 97% 95% 98% 100%  Weight:      Height:       CBC:  Recent Labs  Lab 12/09/21 1428 12/09/21 1437 12/10/21 0539 12/12/21 0200  WBC 5.2  --  6.5 6.1  NEUTROABS 1.8  --  4.1  --   HGB 12.4   < > 10.9* 10.3*  HCT 38.6   < > 33.3* 32.5*  MCV 81.4  --  81.6 83.5  PLT 181  --  179 148*   < > = values in this interval not displayed.   Basic Metabolic Panel:  Recent Labs  Lab 12/10/21 0539 12/12/21 0200  NA 136 137  K 3.9 3.6  CL 104 105  CO2 21* 24  GLUCOSE 93 97  BUN 7* 8  CREATININE 0.89 0.94  CALCIUM 8.4* 8.5*   Lipid Panel:  Recent Labs  Lab 12/10/21 0539  CHOL 196  TRIG 138  137  HDL 37*  CHOLHDL 5.3  VLDL 28  LDLCALC 131*   HgbA1c:  Recent Labs  Lab 12/09/21 1909  HGBA1C 5.7*   Urine Drug Screen: No results for input(s): "LABOPIA", "COCAINSCRNUR", "LABBENZ", "AMPHETMU", "THCU", "LABBARB" in the last 168 hours.  Alcohol Level  Recent Labs  Lab 12/09/21 1428  ETH <10    IMAGING past 24 hours CT HEAD WO CONTRAST (5MM)  Result Date: 12/12/2021 CLINICAL DATA:  81 year old female code stroke presentation on 12/09/2021 with left M1 occlusion. Status post TNK and NIR. Left MCA infarct on MRI yesterday. Subsequent encounter. EXAM: CT  HEAD WITHOUT CONTRAST TECHNIQUE: Contiguous axial images were obtained from the base of the skull through the vertex without intravenous contrast. RADIATION DOSE REDUCTION: This exam was performed according to the departmental dose-optimization program which includes automated exposure control, adjustment of the mA and/or kV according to patient size and/or use of iterative reconstruction technique. COMPARISON:  None Available. FINDINGS: Brain: Cytotoxic edema in the left basal ganglia and posterior insula/sylvian fissure. Mild regional mass effect including partially effaced left lateral ventricle is stable with no midline shift. Trace subarachnoid hemorrhage in the left hemisphere visible layering in the left sylvian fissure and also in a sulcus series 3, image 20. No convincing parenchymal hemorrhage or hemorrhagic transformation. No IVH or ventriculomegaly. Basilar cisterns remain normal. Chronic encephalomalacia in the left cerebellum and right occipital pole appears stable. And right hemisphere white matter hypodensity most pronounced in the corona radiata is stable. Vascular: Calcified atherosclerosis at the skull base. No suspicious intracranial vascular hyperdensity. Skull: Negative. Sinuses/Orbits: Visualized paranasal sinuses and mastoids are clear. Other: No acute orbit or scalp soft tissue finding. IMPRESSION: 1. Expected CT appearance of the Left MCA territory infarcts since MRI  yesterday. No parenchymal hemorrhagic transformation. No midline shift. 2. Trace subarachnoid hemorrhage in the left hemisphere. 3. No new intracranial abnormality. Electronically Signed   By: Genevie Ann M.D.   On: 12/12/2021 05:52    PHYSICAL EXAM  Temp:  [97.5 F (36.4 C)-98.9 F (37.2 C)] 98.6 F (37 C) (10/10 1141) Pulse Rate:  [68-83] 68 (10/10 1141) Resp:  [14-27] 14 (10/10 1141) BP: (129-179)/(72-164) 147/72 (10/10 1141) SpO2:  [61 %-100 %] 100 % (10/10 1141)  General -pleasant  elderly female, intubated Well  nourished, well developed, in no apparent distress.  Cardiovascular - irregular- ? A fib   Mental Status -  Awake alert and interactive.  Speech is slightly nonfluent without dysarthria.  Follows commands well..  Extraocular movements are full range pupils equal and reactive.  Blinks to threat bilaterally slight right lower facial weakness.  Tongue midline. Motor Strength - Mild right hemiparesis 4+/5 with weakness predominantly of right grip and intrinsic hand muscles.  Trace weakness of right hip flexors and ankle dorsiflexors.  Good strength on the left Motor Tone - Muscle tone was assessed at the neck and appendages and was normal.  Sensory - decreased on right arm    Coordination - unable to assess   Gait and Station - deferred.   ASSESSMENT/PLAN [name] is a 81 y.o. female with history of  colon cancer, hypercholesterolemia, essential hypertension, obesity with a BMI of 34.84 kg/m, and remote CVA who presented to [facility] on 12/09/2021 via EMS as a transfer from [facility] ED for neuro IR after vessel imaging at [facility] revealed a left M1 MCA occlusion.  Patient was in her usual state of health today at 13:00. Patient was given TNK and was determined to be a candidate for VIR for which transfer to [facility] was initiated.   Stroke:  Acute Left MCA ischemic infarct s/p TNK; S/p revascularization of Left M1 occlusion with TICI 3 Etiology:  likely due to new onset A fib   Code Stroke CT head  1. No acute intracranial abnormality or significant interval change. 2. Stable remote infarcts of the right corona radiata, right caudate head, and inferior left cerebellum. 3. Stable atrophy and white matter disease. This likely reflects the sequela of chronic microvascular ischemia. 4. Aspects is 10/10. CTA head & neck  1. Proximal left M1 occlusion with limited collaterals within the anterior aspect of the left MCA distribution. 2. Moderate stenosis of the right M1 segment. 3.  Segmental narrowing within distal right MCA branch vessels without a significant proximal stenosis or occlusion. 4. Moderate stenosis of the proximal right P2 segment. 5. 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 It is occluded again at the C2-3 level. 6. High-grade stenoses of the proximal internal carotid arteries bilaterally at the carotid bifurcations. 7. Moderate stenosis at the origin of the right vertebral artery Cerebral angio  Occluded left middle cerebral artery M1 segment Post IR CT No evidence of intracranial hemorrhage. MRI  12/10/21 1. Acute/subacute nonhemorrhagic infarcts involving the left lentiform nucleus and caudate. 2. Acute/subacute nonhemorrhagic infarct involving the posterior left insular cortex. 3. The remainder of the left MCA territory is spared. 4. Subarachnoid hemorrhage in the left parietal lobe. 5. Stable atrophy and white matter disease elsewhere likely reflects the sequela of chronic microvascular ischemia. 2D Echo EF 60-65%. moderate left ventricular hypertrophy of the basal-septal segment. No shunt EKG with A fib  LDL 131 HgbA1c 5.7 VTE prophylaxis -  SCD's    Diet   DIET DYS 3 Room service appropriate? Yes; Fluid consistency: Nectar Thick   aspirin 81 mg daily and clopidogrel 75 mg daily prior to admission, now on No antithrombotic. Will start aspirin  if no hemorrhage on 24 hr post TNK brain imaging. Will need to consider starting [facility]  Therapy recommendations:  possible CIR candidate - currently being reviewed Disposition:  pending   Hypertension Hypotension  Home meds:  amlodipine, verapamil  UnStable Was on Neo gtt @ 18mg now off Long-term BP goal normotensive  Hyperlipidemia Home meds:  crestor '10mg'$ , increased to '40mg'$ , resumed in hospital LDL 131, goal < 70 High intensity statin not indicated  Continue statin at discharge  New onset A fib EKG with A fib Cardiology consult  [facility]   Acute respiratory Failure, resolved Management per CCM   Dysphagia  NPO Pt did not pass previous swallow evaluation.  Speech to recheck today 10/9. If pt fails again will need to consider tube feedings.  Other Stroke Risk Factors Advanced Age >/= 638 Obesity, Body mass index is 34.84 kg/m., BMI >/= 30 associated with increased stroke risk, recommend weight loss, diet and exercise as appropriate  Hx stroke/TIA  Other Active Problems and Issues Anxiety  Anemia Hgb drifting down - 12.4->11.2->10.9 (will recheck in AM) CCM following - appreciate assistance  IR following - appreciate assistance Transfer to telemetry floor today.  Hospital day # 3  Per RN note - 12/10/2021  8:24 PM "MD KLeonel Ramsaycontacted by this RN as patient has not received aspirin after 24 hour post TNK MRI was completed. Patient has failed swallow screen, however rectal aspirin may be given. Due to bleeding that was revealed on the imaging, verbal orders given to hold off on aspirin administration until tomorrow.  Verbal order from MD KLeonel Ramsaythat BP parameters may also be loosened to SBP <150 as patient is post-24 hours from procedure"  Max BP for 12/11/21 - 0200 - 142/71 Extubated per Respiratory Therapy - 12/10/2021 10:46 AM Possible CIR candidate - patient being evaluated   Patient presented with aphasia and right hemiparesis due to left M1 occlusion likely from new onset A-fib and was treated with IV thrombolysis with TNK followed by successful mechanical thrombectomy with TICI 3 revascularization.  She is making good neurological improvement with only mild right-sided weakness remaining.. . Started aspirin  but will transition to anticoagulation with Eliquis in next few days [facility].   No family available at the bedside during rounds.    Greater than 50% time during this 35-minute visit was spent on counseling and coordination of care about her A-fib and stroke and  discussion with patient and care team and answering questions.   PAntony Contras MD       To contact Stroke Continuity provider, please refer to [email or web address] After hours, contact General Neurology

## 2021-12-12 NOTE — Progress Notes (Signed)
Cardiology Progress Note  Patient ID: Janice Curtis MRN: [id number] DOB: Jul 21, 1940 Date of Encounter: 12/12/2021  Primary Cardiologist: None  Subjective   Chief Complaint: None.  HPI: Denies chest pain or trouble breathing.  Remains in rate controlled atrial fibrillation.  Neurology waiting to start Eliquis.  ROS:  All other ROS reviewed and negative. Pertinent positives noted in the HPI.     Inpatient Medications  Scheduled Meds:  Chlorhexidine Gluconate Cloth  6 each Topical Q0600   metoprolol tartrate  25 mg Oral BID   pantoprazole (PROTONIX) IV  40 mg Intravenous QHS   rosuvastatin  40 mg Per Tube Daily   Continuous Infusions:  PRN Meds: acetaminophen **OR** acetaminophen (TYLENOL) oral liquid 160 mg/5 mL **OR** acetaminophen, mouth rinse, senna-docusate   Vital Signs   Vitals:   12/11/21 2332 12/12/21 0427 12/12/21 0847 12/12/21 1141  BP: (!) 156/84 (!) 157/74 (!) 167/85 (!) 147/72  Pulse: 83 71 76 68  Resp: '18 16 14 14  '$ Temp: (!) 97.5 F (36.4 C) 98 F (36.7 C) 98.9 F (37.2 C) 98.6 F (37 C)  TempSrc: Axillary Oral Oral Oral  SpO2: 97% 95% 98% 100%  Weight:      Height:        Intake/Output Summary (Last 24 hours) at 12/12/2021 1351 Last data filed at 12/12/2021 1000 Gross per 24 hour  Intake 100 ml  Output --  Net 100 ml      12/09/2021    3:05 PM 12/09/2021    2:37 PM 10/20/2020    6:39 PM  Last 3 Weights  Weight (lbs) 196 lb 10.4 oz 190 lb 11.2 oz 190 lb 11.2 oz  Weight (kg) 89.2 kg 86.5 kg 86.5 kg      Telemetry  Overnight telemetry shows Afib 70s, which I personally reviewed.   Physical Exam   Vitals:   12/11/21 2332 12/12/21 0427 12/12/21 0847 12/12/21 1141  BP: (!) 156/84 (!) 157/74 (!) 167/85 (!) 147/72  Pulse: 83 71 76 68  Resp: '18 16 14 14  '$ Temp: (!) 97.5 F (36.4 C) 98 F (36.7 C) 98.9 F (37.2 C) 98.6 F (37 C)  TempSrc: Axillary Oral Oral Oral  SpO2: 97% 95% 98% 100%  Weight:      Height:        Intake/Output  Summary (Last 24 hours) at 12/12/2021 1351 Last data filed at 12/12/2021 1000 Gross per 24 hour  Intake 100 ml  Output --  Net 100 ml       12/09/2021    3:05 PM 12/09/2021    2:37 PM 10/20/2020    6:39 PM  Last 3 Weights  Weight (lbs) 196 lb 10.4 oz 190 lb 11.2 oz 190 lb 11.2 oz  Weight (kg) 89.2 kg 86.5 kg 86.5 kg    Body mass index is 34.84 kg/m.   General: Well nourished, well developed, in no acute distress Head: Atraumatic, normal size  Eyes: PEERLA, EOMI  Neck: Supple, no JVD Endocrine: No thryomegaly Cardiac: Normal S1, S2; irregular rhythm Lungs: Clear to auscultation bilaterally, no wheezing, rhonchi or rales  Abd: Soft, nontender, no hepatomegaly  Ext: No edema, pulses 2+ Musculoskeletal: No deformities, BUE and BLE strength normal and equal Skin: Warm and dry, no rashes   Neuro: Alert and oriented to person, place, time, and situation, CNII-XII grossly intact, no focal deficits  Psych: Normal mood and affect   Labs  High Sensitivity Troponin:   Recent Labs  Lab 12/09/21 1909  TROPONINIHS  7     Cardiac EnzymesNo results for input(s): "TROPONINI" in the last 168 hours. No results for input(s): "TROPIPOC" in the last 168 hours.  Chemistry Recent Labs  Lab 12/09/21 1428 12/09/21 1437 12/09/21 2111 12/09/21 2327 12/10/21 0539 12/12/21 0200  NA 134* 136   < > 135 136 137  K 3.2* 3.2*   < > 3.7 3.9 3.6  CL 103 102  --   --  104 105  CO2 22  --   --   --  21* 24  GLUCOSE 114* 113*  --   --  93 97  BUN 12 10  --   --  7* 8  CREATININE 0.90 0.90  --   --  0.89 0.94  CALCIUM 8.6*  --   --   --  8.4* 8.5*  PROT 7.6  --   --   --   --   --   ALBUMIN 3.9  --   --   --   --   --   AST 32  --   --   --   --   --   ALT 23  --   --   --   --   --   ALKPHOS 69  --   --   --   --   --   BILITOT 0.7  --   --   --   --   --   GFRNONAA >60  --   --   --  >60 >60  ANIONGAP 9  --   --   --  11 8   < > = values in this interval not displayed.    Hematology Recent  Labs  Lab 12/09/21 1428 12/09/21 1437 12/09/21 2327 12/10/21 0539 12/12/21 0200  WBC 5.2  --   --  6.5 6.1  RBC 4.74  --   --  4.08 3.89  HGB 12.4   < > 11.2* 10.9* 10.3*  HCT 38.6   < > 33.0* 33.3* 32.5*  MCV 81.4  --   --  81.6 83.5  MCH 26.2  --   --  26.7 26.5  MCHC 32.1  --   --  32.7 31.7  RDW 13.9  --   --  14.0 14.4  PLT 181  --   --  179 148*   < > = values in this interval not displayed.   BNPNo results for input(s): "BNP", "PROBNP" in the last 168 hours.  DDimer No results for input(s): "DDIMER" in the last 168 hours.   Radiology  DG Swallowing Func-Speech Pathology  Result Date: 12/12/2021 Table formatting from the original result was not included. Objective Swallowing Evaluation: Type of Study: MBS-Modified Barium Swallow Study  Patient Details Name: MELODI HAPPEL MRN: [id number] Date of Birth: 1941/01/17 Today's Date: 12/12/2021 Time: SLP Start Time (ACUTE ONLY): 9379 -SLP Stop Time (ACUTE ONLY): 0240 SLP Time Calculation (min) (ACUTE ONLY): 20 min Past Medical History: Past Medical History: Diagnosis Date  Anxiety   Colon cancer ([place])   Hypercholesterolemia   Hypertension   Obesity   Stroke ([place]) 12/05/2009 Past Surgical History: Past Surgical History: Procedure Laterality Date  COLONOSCOPY  12/27/08  simple adenoma/inflammatory polyp at anastomosis  COLONOSCOPY  04/23/2011  Procedure: COLONOSCOPY;  Surgeon: Dorothyann Peng, MD;  [facility];  Service: Endoscopy;  Laterality: N/A;  10:00  RADIOLOGY WITH ANESTHESIA N/A 12/09/2021  Procedure: RADIOLOGY WITH ANESTHESIA;  Surgeon: Radiologist, Medication, MD;  Location: [facility];  Service: Radiology;  Laterality: N/A;  RIGHT COLECTOMY  11/14/2007 HPI: Pt is a 81 y.o. female medical history significant for colon cancer, hypercholesterolemia, essential hypertension, obesity, and remote CVA (2011) who presented to [facility] on 12/09/2021 from [facility] ED for neuro IR after vessel imaging at [place] revealed a left M1 MCA  occlusion.  Pt was found to have findings consistent with a left MCA territory infarction. MRI head found acute/subacute nonhemorrhagic infarcts involving the left lentiform nucleus caudate, posterior left insular cortex, and a subarachnoid hemorrhage in the left parietal lobe.  Subjective: pt alert, needs cues  Recommendations for follow up therapy are one component of a multi-disciplinary discharge planning process, led by the attending physician.  Recommendations may be updated based on patient status, additional functional criteria and insurance authorization. Assessment / Plan / Recommendation   12/12/2021  10:00 AM Clinical Impressions Clinical Impression Pt presents with a mild oropharyngeal dysphagia, also impacted by current cognitive-linguistic abilities. She has reduced bolus cohesion, allowing liquids to spill back toward her pharynx without always forming a bolus in her oral cavity first. With reduced lingual propulsion, lingual rocking, and incomplete oral clearance, this results in liquids pooling in her pyriform sinuses before she initiates a swallow. Thin liquids spill into the airway before the swallow although it primarily stays above the vocal folds (PAS 3 and 5). When she does aspirate, however, it is silent. Attempted several strategies to clear and/or prevent penetration (oral hold, chin tuck, cough, throat clear), but pt has a hard time consistently following these commands and they end up being ineffective. She has improved airway protection with nectar thick liquids even with large, consecutive sips that fill the pyriform sinuses. Pharyngeally she seems to have reduced anterior hyoid excursion but it does not impact functionality of swallowing. Also of note, pt had some retrograde flow of barium within the esophagus but it did not reach the level of the UES. Recommend continuing Dys 3 diet and nectar thick liquids for now. SLP Visit Diagnosis Dysphagia, oropharyngeal phase (R13.12) Impact  on safety and function Mild aspiration risk     12/12/2021  10:00 AM Treatment Recommendations Treatment Recommendations Therapy as outlined in treatment plan below     12/12/2021  10:00 AM Prognosis Prognosis for Safe Diet Advancement Good Barriers to Reach Goals Language deficits   12/12/2021  10:00 AM Diet Recommendations SLP Diet Recommendations Dysphagia 3 (Mech soft) solids;Nectar thick liquid Liquid Administration via Cup;Straw Medication Administration Whole meds with liquid Compensations Minimize environmental distractions;Slow rate;Small sips/bites Postural Changes Seated upright at 90 degrees;Remain semi-upright after after feeds/meals (Comment)     12/12/2021  10:00 AM Other Recommendations Oral Care Recommendations Oral care BID Follow Up Recommendations Acute inpatient rehab (3hours/day) Assistance recommended at discharge Intermittent Supervision/Assistance Functional Status Assessment Patient has had a recent decline in their functional status and demonstrates the ability to make significant improvements in function in a reasonable and predictable amount of time.   12/12/2021  10:00 AM Frequency and Duration  Speech Therapy Frequency (ACUTE ONLY) min 2x/week Treatment Duration 2 weeks     12/12/2021  10:00 AM Oral Phase Oral Phase Impaired Oral - Nectar Cup Reduced posterior propulsion;Delayed oral transit;Decreased bolus cohesion;Weak lingual manipulation;Lingual/palatal residue Oral - Nectar Straw Reduced posterior propulsion;Delayed oral transit;Decreased bolus cohesion;Weak lingual manipulation;Lingual/palatal residue;Lingual pumping Oral - Thin Cup Reduced posterior propulsion;Delayed oral transit;Decreased bolus cohesion;Weak lingual manipulation;Lingual/palatal residue Oral - Thin Straw Reduced posterior propulsion;Delayed oral transit;Decreased bolus cohesion;Weak lingual manipulation;Lingual/palatal residue  Oral - Puree Weak lingual manipulation;Lingual pumping;Reduced posterior  propulsion;Delayed oral transit Oral - Regular Impaired mastication;Weak lingual manipulation Oral - Pill [facility]    12/12/2021  10:00 AM Pharyngeal Phase Pharyngeal Phase Impaired Pharyngeal- Thin Cup Delayed swallow initiation-pyriform sinuses;Penetration/Aspiration before swallow;Reduced anterior laryngeal mobility Pharyngeal Material enters airway, CONTACTS cords and not ejected out Pharyngeal- Thin Straw Delayed swallow initiation-pyriform sinuses;Penetration/Aspiration before swallow;Reduced anterior laryngeal mobility Pharyngeal Material enters airway, passes BELOW cords without attempt by patient to eject out (silent aspiration) Pharyngeal- Puree Reduced anterior laryngeal mobility Pharyngeal- Regular Reduced anterior laryngeal mobility Pharyngeal- Pill Reduced anterior laryngeal mobility    12/12/2021  10:00 AM Cervical Esophageal Phase  Cervical Esophageal Phase [facility] Osie Bond., M.A. CCC-SLP Acute Rehabilitation Services Office [phone] Secure chat preferred 12/12/2021, 1:20 PM                     CT HEAD WO CONTRAST (5MM)  Result Date: 12/12/2021 CLINICAL DATA:  81 year old female code stroke presentation on 12/09/2021 with left M1 occlusion. Status post TNK and NIR. Left MCA infarct on MRI yesterday. Subsequent encounter. EXAM: CT HEAD WITHOUT CONTRAST TECHNIQUE: Contiguous axial images were obtained from the base of the skull through the vertex without intravenous contrast. RADIATION DOSE REDUCTION: This exam was performed according to the departmental dose-optimization program which includes automated exposure control, adjustment of the mA and/or kV according to patient size and/or use of iterative reconstruction technique. COMPARISON:  None Available. FINDINGS: Brain: Cytotoxic edema in the left basal ganglia and posterior insula/sylvian fissure. Mild regional mass effect including partially effaced left lateral ventricle is stable with no midline shift. Trace subarachnoid hemorrhage in the left  hemisphere visible layering in the left sylvian fissure and also in a sulcus series 3, image 20. No convincing parenchymal hemorrhage or hemorrhagic transformation. No IVH or ventriculomegaly. Basilar cisterns remain normal. Chronic encephalomalacia in the left cerebellum and right occipital pole appears stable. And right hemisphere white matter hypodensity most pronounced in the corona radiata is stable. Vascular: Calcified atherosclerosis at the skull base. No suspicious intracranial vascular hyperdensity. Skull: Negative. Sinuses/Orbits: Visualized paranasal sinuses and mastoids are clear. Other: No acute orbit or scalp soft tissue finding. IMPRESSION: 1. Expected CT appearance of the Left MCA territory infarcts since MRI yesterday. No parenchymal hemorrhagic transformation. No midline shift. 2. Trace subarachnoid hemorrhage in the left hemisphere. 3. No new intracranial abnormality. Electronically Signed   By: Genevie Ann M.D.   On: 12/12/2021 05:52   MR BRAIN WO CONTRAST  Result Date: 12/10/2021 CLINICAL DATA:  Stroke, follow-up.  Status post TNK. EXAM: MRI HEAD WITHOUT CONTRAST TECHNIQUE: Multiplanar, multiecho pulse sequences of the brain and surrounding structures were obtained without intravenous contrast. COMPARISON:  CT head and CTA head and neck 12/09/2021. MR head 10/20/2020 FINDINGS: Brain: Diffusion-weighted images demonstrate acute/subacute nonhemorrhagic infarcts involving the left lentiform nucleus and caudate. Acute nonhemorrhagic infarct is present within the posterior left insular cortex. The remainder of the left MCA territory is spared. T2 and FLAIR signal changes are associated with the areas of subacute infarction. Periventricular and subcortical T2 hyperintensities are otherwise stable. Subarachnoid hemorrhage is present in the left parietal lobe. No parenchymal hemorrhage is present. T2 hyperintensities within the central pons are stable. Remote nonhemorrhagic lacunar infarct in the medial  left cerebellum is stable. A remote lacunar infarct in the anterior limb of the right internal capsule is stable. Vascular: Flow is present in the major intracranial arteries. Skull and upper cervical spine: The craniocervical junction is normal.  Upper cervical spine is within normal limits. Marrow signal is unremarkable. Sinuses/Orbits: The paranasal sinuses and mastoid air cells are clear. The globes and orbits are within normal limits. IMPRESSION: 1. Acute/subacute nonhemorrhagic infarcts involving the left lentiform nucleus and caudate. 2. Acute/subacute nonhemorrhagic infarct involving the posterior left insular cortex. 3. The remainder of the left MCA territory is spared. 4. Subarachnoid hemorrhage in the left parietal lobe. 5. Stable atrophy and white matter disease elsewhere likely reflects the sequela of chronic microvascular ischemia. Electronically Signed   By: San Morelle M.D.   On: 12/10/2021 15:35    Cardiac Studies  TTE 12/10/2021   1. Left ventricular ejection fraction, by estimation, is 60 to 65%. The  left ventricle has normal function. The left ventricle has no regional  wall motion abnormalities. There is moderate left ventricular hypertrophy  of the basal-septal segment. Left  ventricular diastolic parameters are indeterminate.   2. Right ventricular systolic function is normal. The right ventricular  size is normal. There is normal pulmonary artery systolic pressure. The  estimated right ventricular systolic pressure is 40.8 mmHg.   3. The mitral valve is normal in structure. No evidence of mitral valve  regurgitation. No evidence of mitral stenosis.   4. Tricuspid valve regurgitation is moderate.   5. The aortic valve is tricuspid. Aortic valve regurgitation is mild. No  aortic stenosis is present.   6. Pulmonic valve regurgitation is moderate.   7. The inferior vena cava is normal in size with greater than 50%  respiratory variability, suggesting right atrial  pressure of 3 mmHg.   Patient Profile  JUDI JAFFE is a 81 y.o. female with hypertension, obesity, hyperlipidemia who was admitted with stroke and new onset atrial fibrillation.  Assessment & Plan   #New onset atrial fibrillation #Left MCA stroke -Echo normal.  TSH normal.  No signs of heart failure.  She has risk factors including advanced age and hypertension.  Now here with stroke.  Recovering.  Would recommend to continue metoprolol tartrate 25 mg twice daily.  Start Eliquis when cleared by surgery.  -Overall no symptoms of atrial fibrillation.  Would recommend rate control strategy for now. -She will see me in the office in 2 to 3 months.  [place] will sign off.   Medication Recommendations: Medications as above Other recommendations (labs, testing, etc): None Follow up as an outpatient: She will see me in the office in 2 to 3 months  For questions or updates, please contact [place] Please consult [email or web address] for contact info under        Signed, [place] T. Audie Box, MD, [place]  12/12/2021 1:51 PM

## 2021-12-12 NOTE — Progress Notes (Signed)
  Transition of Care [facility]) Screening Note   Patient Details  Name: Janice Curtis Date of Birth: [birth year]   Transition of Care [facility]) CM/SW Contact:    Pollie Friar, RN Phone Number: 12/12/2021, 4:10 PM   Pt is from home with daughter. Recommendations are for CIR. CIR working up. Transition of Care Department [facility]) has reviewed patient. We will continue to monitor patient advancement through interdisciplinary progression rounds. If new patient transition needs arise, please place a TOC consult.

## 2021-12-12 NOTE — Progress Notes (Signed)
Inpatient Rehab Admissions Coordinator:   Met with patient at bedside. Discussed CIR. No family present in room and attempted to call daughter and son and left message for both. Patient able to answer questions, states she lives with her 80 year old grandson who is in school during the day. Unsure if all info she provided is accurate. Will continue to follow for potential admit to CIR and attempt to connect with family to determine support and home info.      Rehab Admissons Coordinator [place], [place], [place] [phone]

## 2021-12-12 NOTE — Progress Notes (Signed)
Modified Barium Swallow Progress Note  Patient Details  Name: Janice Curtis MRN: [id number] Date of Birth: June 14, 1940  Today's Date: 12/12/2021  Modified Barium Swallow completed.  Full report located under Chart Review in the Imaging Section.  Brief recommendations include the following:  Clinical Impression  Pt presents with a mild oropharyngeal dysphagia, also impacted by current cognitive-linguistic abilities. She has reduced bolus cohesion, allowing liquids to spill back toward her pharynx without always forming a bolus in her oral cavity first. With reduced lingual propulsion, lingual rocking, and incomplete oral clearance, this results in liquids pooling in her pyriform sinuses before she initiates a swallow. Thin liquids spill into the airway before the swallow although it primarily stays above the vocal folds (PAS 3 and 5). When she does aspirate, however, it is silent. Attempted several strategies to clear and/or prevent penetration (oral hold, chin tuck, cough, throat clear), but pt has a hard time consistently following these commands and they end up being ineffective. She has improved airway protection with nectar thick liquids even with large, consecutive sips that fill the pyriform sinuses. Pharyngeally she seems to have reduced anterior hyoid excursion but it does not impact functionality of swallowing. Also of note, pt had some retrograde flow of barium within the esophagus but it did not reach the level of the UES. Recommend continuing Dys 3 diet and nectar thick liquids for now.   Swallow Evaluation Recommendations       SLP Diet Recommendations: Dysphagia 3 (Mech soft) solids;Nectar thick liquid   Liquid Administration via: Cup;Straw   Medication Administration: Whole meds with liquid   Supervision: Staff to assist with self feeding;Full supervision/cueing for compensatory strategies   Compensations: Minimize environmental distractions;Slow rate;Small sips/bites    Postural Changes: Seated upright at 90 degrees;Remain semi-upright after after feeds/meals (Comment)   Oral Care Recommendations: Oral care BID        Osie Bond., M.A. [place] Office [phone]  Secure chat preferred  12/12/2021,11:48 AM

## 2021-12-12 NOTE — Progress Notes (Signed)
Inpatient Rehabilitation Admissions Coordinator   I received call back from son, Janice Curtis., He states his sister and Mom live together and patient alone during the day. 81 year old grandson also there but in school. I asked him to have his sister call me to discuss Mom's rehab venue options.  Janice Baxter, RN, MSN Rehab Admissions Coordinator [phone] 12/12/2021 1:59 PM

## 2021-12-13 DIAGNOSIS — I639 Cerebral infarction, unspecified: Secondary | ICD-10-CM | POA: Diagnosis not present

## 2021-12-13 MED ORDER — LABETALOL HCL 5 MG/ML IV SOLN
10.0000 mg | Freq: Once | INTRAVENOUS | Status: AC
  Administered 2021-12-13: 10 mg via INTRAVENOUS
  Filled 2021-12-13: qty 4

## 2021-12-13 NOTE — Progress Notes (Signed)
Inpatient Rehabilitation Admissions Coordinator   I spoke with daughter , Adonis Huguenin, by phone. She and her brother both work day shift and she is concerned for her Mom to be home alone after the CVA. She is in agreement to pursue SNF at this time. I have alerted acute team and TOC. We will sign off and not pursue CIR at this time.  Danne Baxter, RN, MSN Rehab Admissions Coordinator [phone] 12/13/2021 12:08 PM

## 2021-12-13 NOTE — TOC Initial Note (Signed)
Transition of Care [facility]) - Initial/Assessment Note    Patient Details  Name: Janice Curtis MRN: [id number] Date of Birth: 1940/08/19  Transition of Care [facility]) CM/SW Contact:    Geralynn Ochs, LCSW Phone Number: 12/13/2021, 3:31 PM  Clinical Narrative:             CSW spoke with daughter, Adonis Huguenin, to discuss SNF placement. Katrina interested in [facility], if possible. CSW completed referral and faxed out, awaiting response. CSW to follow.      Expected Discharge Plan: Skilled Nursing Facility Barriers to Discharge: Continued Medical Work up, Ship broker   Patient Goals and CMS Choice Patient states their goals for this hospitalization and ongoing recovery are:: patient unable to participate in goal setting, not oriented CMS Medicare.gov Compare Post Acute Care list provided to:: Patient Represenative (must comment) Choice offered to / list presented to : Adult Children  Expected Discharge Plan and Services Expected Discharge Plan: [place] Acute Care Choice: [place] arrangements for the past 2 months: Single Family Home                                      Prior Living Arrangements/Services Living arrangements for the past 2 months: Single Family Home Lives with:: Adult Children Patient language and need for interpreter reviewed:: No Do you feel safe going back to the place where you live?: Yes      Need for Family Participation in Patient Care: Yes (Comment) Care giver support system in place?: No (comment)   Criminal Activity/Legal Involvement Pertinent to Current Situation/Hospitalization: No - Comment as needed  Activities of Daily Living Home Assistive Devices/Equipment: None ADL Screening (condition at time of admission) Patient's cognitive ability adequate to safely complete daily activities?: No Is the patient deaf or have difficulty hearing?: No Does the patient have difficulty  seeing, even when wearing glasses/contacts?: No Does the patient have difficulty concentrating, remembering, or making decisions?: Yes Patient able to express need for assistance with ADLs?: Yes Does the patient have difficulty dressing or bathing?: Yes Independently performs ADLs?: No Communication: Dependent Is this a change from baseline?: Change from baseline, expected to last >3 days Dressing (OT): Dependent Is this a change from baseline?: Change from baseline, expected to last >3 days Grooming: Dependent Is this a change from baseline?: Change from baseline, expected to last >3 days Feeding: Dependent Is this a change from baseline?: Change from baseline, expected to last >3 days Bathing: Dependent Is this a change from baseline?: Change from baseline, expected to last >3 days Toileting: Dependent Is this a change from baseline?: Change from baseline, expected to last >3days In/Out Bed: Dependent Is this a change from baseline?: Change from baseline, expected to last >3 days Walks in Home: Dependent Is this a change from baseline?: Change from baseline, expected to last >3 days Does the patient have difficulty walking or climbing stairs?: Yes Weakness of Legs: Both Weakness of Arms/Hands: Both  Permission Sought/Granted Permission sought to share information with : Facility Sport and exercise psychologist, Family Supports Permission granted to share information with : Yes, Verbal Permission Granted  Share Information with NAME: Katrina  Permission granted to share info w AGENCY: SNF  Permission granted to share info w Relationship: Daughter     Emotional Assessment   Attitude/Demeanor/Rapport: Unable to Assess Affect (typically observed): Unable to Assess Orientation: : Oriented to  Self, Oriented to Place Alcohol / Substance Use: Not Applicable Psych Involvement: No (comment)  Admission diagnosis:  Aphasia [R47.01] Weakness [R53.1] Cerebrovascular accident (CVA) due to  occlusion of left middle cerebral artery (HCC) [I63.512] Acute ischemic stroke [facility]) [I63.9] Middle cerebral artery embolism, left [I66.02] Patient Active Problem List   Diagnosis Date Noted   Acute ischemic stroke ([place]) 12/09/2021   Middle cerebral artery embolism, left 12/09/2021   CVA (cerebral vascular accident) ([place]) 10/20/2020   VITAMIN D DEFICIENCY 12/08/2008   HYPERCHOLESTEROLEMIA 12/08/2008   HYPERTENSION 12/08/2008   History of malignant neoplasm of large intestine 12/08/2008   HYPOKALEMIA, HX OF 12/08/2008   ANEMIA, IRON DEFICIENCY, HX OF 12/08/2008   PCP:  Pllc, [place]:   [place], [place] - [address] AT [place]. [place] [id number] Phone: [phone] Fax: [phone]  [id number] [place] [id number]: [phone]: [phone]    Social Determinants of Health (SDOH) Interventions    Readmission Risk Interventions     No data to display

## 2021-12-13 NOTE — Progress Notes (Signed)
Physical Therapy Treatment Patient Details Name: Janice Curtis MRN: [id number] DOB: 11-18-1940 Today's Date: 12/13/2021   History of Present Illness 81 y.o. female admitted on 12/09/21 for R sided weakness, aphasia.  Dx with L M1 MCA stroke s/p TNK and IR for thrombectomy.  Intubated for IR procedure on 10/7 and extubated 10/8.  Pt with significant PMH of colon CA s/p colectomy, HTN, obesity, stroke (2011).    PT Comments    Seen with OT for progression of ambulation with chair follow.  Started with wall rail on L to assist with R lateral lean and pt needing min A overall with L HHA.  Used RW for a bit and pt with focus on R foot stating it does not move well, but able to progress with cues and min A for L lateral weight shift.  Still with flat affect and needing cues and assist to initiate.  PT will continue to follow acutely.  She remains appropriate for acute inpatient rehab.    Recommendations for follow up therapy are one component of a multi-disciplinary discharge planning process, led by the attending physician.  Recommendations may be updated based on patient status, additional functional criteria and insurance authorization.  Follow Up Recommendations  Acute inpatient rehab (3hours/day)     Assistance Recommended at Discharge Frequent or constant Supervision/Assistance  Patient can return home with the following Assistance with cooking/housework;Assistance with feeding;Direct supervision/assist for financial management;Direct supervision/assist for medications management;Assist for transportation;Help with stairs or ramp for entrance;A little help with walking and/or transfers;A little help with bathing/dressing/bathroom   Equipment Recommendations  Rolling walker (2 wheels)    Recommendations for Other Services       Precautions / Restrictions Precautions Precautions: Fall Precaution Comments: R side weakness     Mobility  Bed Mobility Overal bed mobility: Needs  Assistance Bed Mobility: Supine to Sit     Supine to sit: HOB elevated Sit to supine: Min assist   General bed mobility comments: cues for technique, assist for getting hips to EOB    Transfers Overall transfer level: Needs assistance Equipment used: 2 person hand held assist, Rolling walker (2 wheels) (rail in hallway) Transfers: Sit to/from Stand, Bed to chair/wheelchair/BSC Sit to Stand: Mod assist, Min assist   Step pivot transfers: Mod assist, +2 safety/equipment       General transfer comment: going to chair on the right, assist for moving R leg to step to chair with increased time, flexed posture; sit to stand between mod and min A performed x 4 reps    Ambulation/Gait Ambulation/Gait assistance: Min assist, +2 safety/equipment Gait Distance (Feet): 15 Feet (& 20' w/RW) Assistive device: Rolling walker (2 wheels), 1 person hand held assist (wall rail) Gait Pattern/deviations: Step-to pattern, Decreased stride length, Trunk flexed, Decreased weight shift to left, Decreased step length - right       General Gait Details: increased time for R foot progression with walker as compared to wall rail on the L; she walked about 15' on rail with HHA on R; then with RW about 20' with flexed posture, reporting R side doesn't move well so looking at her feet cues for forward gaze and min A overall.   Stairs             Wheelchair Mobility    Modified Rankin (Stroke Patients Only) Modified Rankin (Stroke Patients Only) Pre-Morbid Rankin Score: No symptoms Modified Rankin: Moderately severe disability     Balance Overall balance assessment: Needs assistance Sitting-balance support: Feet  supported Sitting balance-Leahy Scale: Fair     Standing balance support: During functional activity, No upper extremity supported Standing balance-Leahy Scale: Poor Standing balance comment: stood at sink to don brief pt holding sink and min A for balance, pt combing hair with both  hands leaning against counter and min A for balance                            Cognition Arousal/Alertness: Awake/alert Behavior During Therapy: Flat affect Overall Cognitive Status: Impaired/Different from baseline Area of Impairment: Awareness, Safety/judgement, Following commands, Problem solving                       Following Commands: Follows one step commands consistently, Follows one step commands with increased time Safety/Judgement: Decreased awareness of safety, Decreased awareness of deficits   Problem Solving: Slow processing, Decreased initiation, Requires verbal cues, Requires tactile cues General Comments: limited verbalizations, needing cues for safety, L lateral lean and to find room when returning in chair, slow initiation        Exercises      General Comments General comments (skin integrity, edema, etc.): HR max 120 in a-fib      Pertinent Vitals/Pain Pain Assessment Faces Pain Scale: No hurt    Home Living                          Prior Function            PT Goals (current goals can now be found in the care plan section) Progress towards PT goals: Progressing toward goals    Frequency    Min 4X/week      PT Plan Current plan remains appropriate    Co-evaluation PT/OT/SLP Co-Evaluation/Treatment: Yes Reason for Co-Treatment: For patient/therapist safety;To address functional/ADL transfers PT goals addressed during session: Mobility/safety with mobility;Proper use of DME;Balance        AM-PAC PT "6 Clicks" Mobility   Outcome Measure  Help needed turning from your back to your side while in a flat bed without using bedrails?: A Little Help needed moving from lying on your back to sitting on the side of a flat bed without using bedrails?: A Lot Help needed moving to and from a bed to a chair (including a wheelchair)?: Total Help needed standing up from a chair using your arms (e.g., wheelchair or bedside  chair)?: A Lot Help needed to walk in hospital room?: A Lot Help needed climbing 3-5 steps with a railing? : Total 6 Click Score: 11    End of Session Equipment Utilized During Treatment: Gait belt Activity Tolerance: Patient tolerated treatment well Patient left: in chair;with call bell/phone within reach;with chair alarm set   PT Visit Diagnosis: Muscle weakness (generalized) (M62.81);Difficulty in walking, not elsewhere classified (R26.2);Hemiplegia and hemiparesis Hemiplegia - Right/Left: Right Hemiplegia - dominant/non-dominant: Dominant Hemiplegia - caused by: Cerebral infarction     Time: 1011-1035 PT Time Calculation (min) (ACUTE ONLY): 24 min  Charges:  $Gait Training: 8-22 mins                     Magda Kiel, PT Acute Rehabilitation Services Office:[phone] 12/13/2021    Reginia Naas 12/13/2021, 11:16 AM

## 2021-12-13 NOTE — Progress Notes (Signed)
Occupational Therapy Treatment Patient Details Name: Janice Curtis MRN: [id number] DOB: [birth year] Today's Date: 12/13/2021   History of present illness 81 y.o. female admitted on 12/09/21 for R sided weakness, aphasia.  Dx with L M1 MCA stroke s/p TNK and IR for thrombectomy.  Intubated for IR procedure on 10/7 and extubated 10/8.  Pt with significant PMH of colon CA s/p colectomy, HTN, obesity, stroke (2011).   OT comments  Pt progressing towards established OT goals. Pt brushing hair with mod A standing at sink for balance and assistance to brush hair with onset of fatigue. Pt pulling up socks sitting EOB with min guard A, but noting decreased dexterity that would be needed to thread sock. Pt performing toilet transfers and functional mobility with mod A +2 this session, noting decreased stride length, difficulty progressing RLE, and decreased safety awareness. Pt with mod-max difficulty following multistep commands at this time. Due to family report of availability and wishes for continued rehabilitation, updating discharge recommendation to SNF.    Recommendations for follow up therapy are one component of a multi-disciplinary discharge planning process, led by the attending physician.  Recommendations may be updated based on patient status, additional functional criteria and insurance authorization.    Follow Up Recommendations  Skilled nursing-short term rehab (<3 hours/day)    Assistance Recommended at Discharge Intermittent Supervision/Assistance  Patient can return home with the following  Two people to help with walking and/or transfers;Two people to help with bathing/dressing/bathroom   Equipment Recommendations  BSC/3in1;Wheelchair (measurements OT);Wheelchair cushion (measurements OT)    Recommendations for Other Services      Precautions / Restrictions Precautions Precautions: Fall Precaution Comments: R side weakness Restrictions Weight Bearing Restrictions: No        Mobility Bed Mobility Overal bed mobility: Needs Assistance Bed Mobility: Supine to Sit     Supine to sit: HOB elevated Sit to supine: Min assist   General bed mobility comments: cues for technique, assist for getting hips to EOB    Transfers Overall transfer level: Needs assistance Equipment used: 2 person hand held assist, Rolling walker (2 wheels) (rail in hallway) Transfers: Sit to/from Stand, Bed to chair/wheelchair/BSC Sit to Stand: Mod assist, Min assist     Step pivot transfers: Mod assist, +2 safety/equipment     General transfer comment: going to chair on the right, assist for moving R leg to step to chair with increased time, flexed posture; sit to stand between mod and min A performed x 4 reps     Balance Overall balance assessment: Needs assistance Sitting-balance support: Feet supported Sitting balance-Leahy Scale: Fair     Standing balance support: During functional activity, No upper extremity supported Standing balance-Leahy Scale: Poor Standing balance comment: stood at sink to don brief pt holding sink and min A for balance, pt combing hair with both hands leaning against counter and min A for balance                           ADL either performed or assessed with clinical judgement   ADL Overall ADL's : Needs assistance/impaired Eating/Feeding: Set up;Sitting;Supervision/ safety Eating/Feeding Details (indicate cue type and reason): to take small sips of nectar thickened water during session as indicated by SLP note. Water removed from reach prior to OT departure. Grooming: Brushing hair;Minimal assistance;Moderate assistance;Standing Grooming Details (indicate cue type and reason): min-mod A for standing balance while brushing hair. Ultimately requiring assistance for back of hair as well  due to matting from bed.             Lower Body Dressing: Min guard Lower Body Dressing Details (indicate cue type and reason): to pull up socks  sitting EOB, however, noting decreased dexterity and pt would require greater assistace to thread new sock Toilet Transfer: +2 for physical assistance;Moderate assistance;BSC/3in1;Rolling walker (2 wheels);Ambulation Toilet Transfer Details (indicate cue type and reason): Mod A +2 to rise. Min-mod+2 for ambulation.         Functional mobility during ADLs: +2 for physical assistance;Moderate assistance;Rolling walker (2 wheels) General ADL Comments: decreased stride length during mobility. Max difficulty following 3+ step commands. Able to recall room after initial education on way out of room, but unaware of passing room when being pushed back in chair    Extremity/Trunk Assessment Upper Extremity Assessment Upper Extremity Assessment: RUE deficits/detail RUE Deficits / Details: Brushing hair with RUE this date. Also reaching for therapist's hand with RUE during bed mobility, but 3+/5 pull to EOB. RUE Sensation: decreased light touch RUE Coordination: decreased fine motor;decreased gross motor   Lower Extremity Assessment Lower Extremity Assessment: Defer to PT evaluation        Vision   Vision Assessment?: Yes Eye Alignment: Impaired (comment) Alignment/Gaze Preference: Gaze left Tracking/Visual Pursuits: Decreased smoothness of vertical tracking;Impaired - to be further tested in functional context Depth Perception: Undershoots Additional Comments: Pt with mild undershooting. Denying diplopia. Gaze L   Perception     Praxis      Cognition Arousal/Alertness: Awake/alert Behavior During Therapy: Flat affect Overall Cognitive Status: Impaired/Different from baseline Area of Impairment: Awareness, Safety/judgement, Following commands, Problem solving                       Following Commands: Follows one step commands consistently, Follows one step commands with increased time Safety/Judgement: Decreased awareness of safety, Decreased awareness of deficits Awareness:  Intellectual Problem Solving: Slow processing, Decreased initiation, Requires verbal cues, Requires tactile cues General Comments: limited verbalizations, needing cues for safety, L lateral lean and to find room when returning in chair, slow initiation. up to Max cues for 3 step command this date. Following one step commands with increased time: "brush your hair"        Exercises      Shoulder Instructions       General Comments HR max 120 in A-fib    Pertinent Vitals/ Pain       Pain Assessment Pain Assessment: Faces Faces Pain Scale: No hurt Pain Intervention(s): Monitored during session  Home Living                                          Prior Functioning/Environment              Frequency  Min 2X/week        Progress Toward Goals  OT Goals(current goals can now be found in the care plan section)  Progress towards OT goals: Progressing toward goals  Acute Rehab OT Goals Patient Stated Goal: none stated OT Goal Formulation: Patient unable to participate in goal setting Time For Goal Achievement: 12/25/21 Potential to Achieve Goals: Good ADL Goals Pt Will Perform Grooming: with min assist;sitting;with adaptive equipment Pt Will Perform Upper Body Bathing: with min assist;sitting;with adaptive equipment Pt Will Transfer to Toilet: with mod assist;stand pivot transfer;bedside commode Pt/caregiver will Perform Home Exercise  Program: Increased strength;Right Upper extremity;With minimal assist;With written HEP provided Additional ADL Goal #1: pt will complete 3 step command 50% of attempts  Plan Discharge plan needs to be updated;Frequency remains appropriate    Co-evaluation    PT/OT/SLP Co-Evaluation/Treatment: Yes Reason for Co-Treatment: For patient/therapist safety;To address functional/ADL transfers PT goals addressed during session: Mobility/safety with mobility;Proper use of DME OT goals addressed during session: ADL's and  self-care;Proper use of Adaptive equipment and DME      AM-PAC OT "6 Clicks" Daily Activity     Outcome Measure   Help from another person eating meals?: A Lot Help from another person taking care of personal grooming?: A Lot Help from another person toileting, which includes using toliet, bedpan, or urinal?: A Lot Help from another person bathing (including washing, rinsing, drying)?: A Lot Help from another person to put on and taking off regular upper body clothing?: A Lot Help from another person to put on and taking off regular lower body clothing?: A Lot 6 Click Score: 12    End of Session Equipment Utilized During Treatment: Gait belt;Rolling walker (2 wheels)  OT Visit Diagnosis: Unsteadiness on feet (R26.81);Muscle weakness (generalized) (M62.81);Hemiplegia and hemiparesis   Activity Tolerance Patient tolerated treatment well   Patient Left in chair;with call bell/phone within reach;with chair alarm set   Nurse Communication Mobility status;Precautions        Time: 3220-2542 OT Time Calculation (min): 23 min  Charges: OT General Charges $OT Visit: 1 Visit OT Treatments $Self Care/Home Management : 8-22 mins  Shanda Howells, OTR/L [facility] Acute Rehabilitation Office: [phone]   Lula Olszewski 12/13/2021, 12:23 PM

## 2021-12-13 NOTE — Progress Notes (Addendum)
STROKE TEAM PROGRESS NOTE   INTERVAL HISTORY Patient is sitting in the chair. Patient still with aphasia and right side hemiparesis.  Repeat CT head yesterday with decreased AH. Will continue to hold [facility] for 3-5 more days. VSS. No new neurological events overnight. Patient will need SNF placement after discharge  Neuro exam unchanged.  Vital signs stable Vitals:   12/12/21 1937 12/12/21 2345 12/13/21 0332 12/13/21 0827  BP: (!) 157/141 (!) 162/80 (!) 188/80 (!) 181/82  Pulse: 82 77 78 77  Resp: '18 16 17 14  '$ Temp: 98 F (36.7 C) 98.7 F (37.1 C) 98.2 F (36.8 C) 98.7 F (37.1 C)  TempSrc:  Oral Oral Oral  SpO2: 98% 97% 100% 98%  Weight:      Height:       CBC:  Recent Labs  Lab 12/09/21 1428 12/09/21 1437 12/10/21 0539 12/12/21 0200  WBC 5.2  --  6.5 6.1  NEUTROABS 1.8  --  4.1  --   HGB 12.4   < > 10.9* 10.3*  HCT 38.6   < > 33.3* 32.5*  MCV 81.4  --  81.6 83.5  PLT 181  --  179 148*   < > = values in this interval not displayed.    Basic Metabolic Panel:  Recent Labs  Lab 12/10/21 0539 12/12/21 0200  NA 136 137  K 3.9 3.6  CL 104 105  CO2 21* 24  GLUCOSE 93 97  BUN 7* 8  CREATININE 0.89 0.94  CALCIUM 8.4* 8.5*    Lipid Panel:  Recent Labs  Lab 12/10/21 0539  CHOL 196  TRIG 138  137  HDL 37*  CHOLHDL 5.3  VLDL 28  LDLCALC 131*    HgbA1c:  Recent Labs  Lab 12/09/21 1909  HGBA1C 5.7*    Urine Drug Screen: No results for input(s): "LABOPIA", "COCAINSCRNUR", "LABBENZ", "AMPHETMU", "THCU", "LABBARB" in the last 168 hours.  Alcohol Level  Recent Labs  Lab 12/09/21 1428  ETH <10     IMAGING past 24 hours DG Swallowing Func-Speech Pathology  Result Date: 12/12/2021 Table formatting from the original result was not included. Objective Swallowing Evaluation: Type of Study: MBS-Modified Barium Swallow Study  Patient Details Name: Janice Curtis MRN: [id number] Date of Birth: [birth year] Today's Date: 12/12/2021 Time: SLP Start Time (ACUTE ONLY):  8786 -SLP Stop Time (ACUTE ONLY): 7672 SLP Time Calculation (min) (ACUTE ONLY): 20 min Past Medical History: Past Medical History: Diagnosis Date  Anxiety   Colon cancer ([place])   Hypercholesterolemia   Hypertension   Obesity   Stroke ([place]) 12/05/2009 Past Surgical History: Past Surgical History: Procedure Laterality Date  COLONOSCOPY  12/27/08  simple adenoma/inflammatory polyp at anastomosis  COLONOSCOPY  04/23/2011  Procedure: COLONOSCOPY;  Surgeon: Dorothyann Peng, MD;  [facility];  Service: Endoscopy;  Laterality: N/A;  10:00  RADIOLOGY WITH ANESTHESIA N/A 12/09/2021  Procedure: RADIOLOGY WITH ANESTHESIA;  Surgeon: Radiologist, Medication, MD;  Location: [place];  Service: Radiology;  Laterality: N/A;  RIGHT COLECTOMY  11/14/2007 HPI: Pt is a 81 y.o. female medical history significant for colon cancer, hypercholesterolemia, essential hypertension, obesity, and remote CVA (2011) who presented to [facility] on 12/09/2021 from [facility] ED for neuro IR after vessel imaging at [facility] revealed a left M1 MCA occlusion.  Pt was found to have findings consistent with a left MCA territory infarction. MRI head found acute/subacute nonhemorrhagic infarcts involving the left lentiform nucleus caudate, posterior left insular cortex, and a subarachnoid hemorrhage  in the left parietal lobe.  Subjective: pt alert, needs cues  Recommendations for follow up therapy are one component of a multi-disciplinary discharge planning process, led by the attending physician.  Recommendations may be updated based on patient status, additional functional criteria and insurance authorization. Assessment / Plan / Recommendation   12/12/2021  10:00 AM Clinical Impressions Clinical Impression Pt presents with a mild oropharyngeal dysphagia, also impacted by current cognitive-linguistic abilities. She has reduced bolus cohesion, allowing liquids to spill back toward her pharynx without always forming a bolus in her oral cavity first.  With reduced lingual propulsion, lingual rocking, and incomplete oral clearance, this results in liquids pooling in her pyriform sinuses before she initiates a swallow. Thin liquids spill into the airway before the swallow although it primarily stays above the vocal folds (PAS 3 and 5). When she does aspirate, however, it is silent. Attempted several strategies to clear and/or prevent penetration (oral hold, chin tuck, cough, throat clear), but pt has a hard time consistently following these commands and they end up being ineffective. She has improved airway protection with nectar thick liquids even with large, consecutive sips that fill the pyriform sinuses. Pharyngeally she seems to have reduced anterior hyoid excursion but it does not impact functionality of swallowing. Also of note, pt had some retrograde flow of barium within the esophagus but it did not reach the level of the UES. Recommend continuing Dys 3 diet and nectar thick liquids for now. SLP Visit Diagnosis Dysphagia, oropharyngeal phase (R13.12) Impact on safety and function Mild aspiration risk     12/12/2021  10:00 AM Treatment Recommendations Treatment Recommendations Therapy as outlined in treatment plan below     12/12/2021  10:00 AM Prognosis Prognosis for Safe Diet Advancement Good Barriers to Reach Goals Language deficits   12/12/2021  10:00 AM Diet Recommendations SLP Diet Recommendations Dysphagia 3 (Mech soft) solids;Nectar thick liquid Liquid Administration via Cup;Straw Medication Administration Whole meds with liquid Compensations Minimize environmental distractions;Slow rate;Small sips/bites Postural Changes Seated upright at 90 degrees;Remain semi-upright after after feeds/meals (Comment)     12/12/2021  10:00 AM Other Recommendations Oral Care Recommendations Oral care BID Follow Up Recommendations Acute inpatient rehab (3hours/day) Assistance recommended at discharge Intermittent Supervision/Assistance Functional Status Assessment  Patient has had a recent decline in their functional status and demonstrates the ability to make significant improvements in function in a reasonable and predictable amount of time.   12/12/2021  10:00 AM Frequency and Duration  Speech Therapy Frequency (ACUTE ONLY) min 2x/week Treatment Duration 2 weeks     12/12/2021  10:00 AM Oral Phase Oral Phase Impaired Oral - Nectar Cup Reduced posterior propulsion;Delayed oral transit;Decreased bolus cohesion;Weak lingual manipulation;Lingual/palatal residue Oral - Nectar Straw Reduced posterior propulsion;Delayed oral transit;Decreased bolus cohesion;Weak lingual manipulation;Lingual/palatal residue;Lingual pumping Oral - Thin Cup Reduced posterior propulsion;Delayed oral transit;Decreased bolus cohesion;Weak lingual manipulation;Lingual/palatal residue Oral - Thin Straw Reduced posterior propulsion;Delayed oral transit;Decreased bolus cohesion;Weak lingual manipulation;Lingual/palatal residue Oral - Puree Weak lingual manipulation;Lingual pumping;Reduced posterior propulsion;Delayed oral transit Oral - Regular Impaired mastication;Weak lingual manipulation Oral - Pill [facility]    12/12/2021  10:00 AM Pharyngeal Phase Pharyngeal Phase Impaired Pharyngeal- Thin Cup Delayed swallow initiation-pyriform sinuses;Penetration/Aspiration before swallow;Reduced anterior laryngeal mobility Pharyngeal Material enters airway, CONTACTS cords and not ejected out Pharyngeal- Thin Straw Delayed swallow initiation-pyriform sinuses;Penetration/Aspiration before swallow;Reduced anterior laryngeal mobility Pharyngeal Material enters airway, passes BELOW cords without attempt by patient to eject out (silent aspiration) Pharyngeal- Puree Reduced anterior laryngeal mobility Pharyngeal- Regular Reduced anterior laryngeal  mobility Pharyngeal- Pill Reduced anterior laryngeal mobility    12/12/2021  10:00 AM Cervical Esophageal Phase  Cervical Esophageal Phase [facility] Osie Bond., M.A. CCC-SLP Acute  Rehabilitation Services Office [phone] Secure chat preferred 12/12/2021, 1:20 PM                      PHYSICAL EXAM  Temp:  [98 F (36.7 C)-98.9 F (37.2 C)] 98.7 F (37.1 C) (10/11 0827) Pulse Rate:  [68-82] 77 (10/11 0827) Resp:  [14-20] 14 (10/11 0827) BP: (147-188)/(70-141) 181/82 (10/11 0827) SpO2:  [97 %-100 %] 98 % (10/11 0827)  General -pleasant  elderly female, intubated Well nourished, well developed, in no apparent distress.  Cardiovascular - irregular- ? A fib   Mental Status -  Awake alert and interactive.  Speech is slightly nonfluent without dysarthria.  Follows commands well..  Extraocular movements are full range pupils equal and reactive.  Blinks to threat bilaterally slight right lower facial weakness.  Tongue midline. Motor Strength - Mild right hemiparesis 4+/5 with weakness predominantly of right grip and intrinsic hand muscles.  Trace weakness of right hip flexors and ankle dorsiflexors.  Good strength on the left Motor Tone - Muscle tone was assessed at the neck and appendages and was normal.  Sensory - decreased on right arm    Coordination - unable to assess   Gait and Station - deferred.   ASSESSMENT/PLAN Ms. CINTIA GLEED is a 81 y.o. female with history of  colon cancer, hypercholesterolemia, essential hypertension, obesity with a BMI of 34.84 kg/m, and remote CVA who presented to [facility] on 12/09/2021 via EMS as a transfer from [facility] ED for neuro IR after vessel imaging at [facility] revealed a left M1 MCA occlusion.  Patient was in her usual state of health today at 13:00. Patient was given TNK and was determined to be a candidate for VIR for which transfer to [facility] was initiated.   Stroke:  Acute Left MCA ischemic infarct s/p TNK; S/p revascularization of Left M1 occlusion with TICI 3 Etiology:  likely due to new onset A fib   Code Stroke CT head  1. No acute intracranial abnormality or significant interval change. 2. Stable remote  infarcts of the right corona radiata, right caudate head, and inferior left cerebellum. 3. Stable atrophy and white matter disease. This likely reflects the sequela of chronic microvascular ischemia. 4. Aspects is 10/10. CTA head & neck  1. Proximal left M1 occlusion with limited collaterals within the anterior aspect of the left MCA distribution. 2. Moderate stenosis of the right M1 segment. 3. Segmental narrowing within distal right MCA branch vessels without a significant proximal stenosis or occlusion. 4. Moderate stenosis of the proximal right P2 segment. 5. 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 It is occluded again at the C2-3 level. 6. High-grade stenoses of the proximal internal carotid arteries bilaterally at the carotid bifurcations. 7. Moderate stenosis at the origin of the right vertebral artery Cerebral angio  Occluded left middle cerebral artery M1 segment Post IR CT No evidence of intracranial hemorrhage. MRI  12/10/21 1. Acute/subacute nonhemorrhagic infarcts involving the left lentiform nucleus and caudate. 2. Acute/subacute nonhemorrhagic infarct involving the posterior left insular cortex. 3. The remainder of the left MCA territory is spared. 4. Subarachnoid hemorrhage in the left parietal lobe. 5. Stable atrophy and white matter disease elsewhere likely reflects the sequela of chronic microvascular ischemia. 2D Echo EF 60-65%. moderate left  ventricular hypertrophy of the basal-septal segment. No shunt EKG with A fib  LDL 131 HgbA1c 5.7 VTE prophylaxis - SCD's    Diet   DIET DYS 3 Room service appropriate? Yes; Fluid consistency: Nectar Thick   aspirin 81 mg daily and clopidogrel 75 mg daily prior to admission, now on No antithrombotic. Will start aspirin  if no hemorrhage on 24 hr post TNK brain imaging. Will need to consider starting [facility]  Therapy recommendations:  SNF Disposition:  pending   Hypertension Hypotension   Home meds:  amlodipine, verapamil  Stable Long-term BP goal normotensive  Hyperlipidemia Home meds:  crestor '10mg'$ , increased to '40mg'$ , resumed in hospital LDL 131, goal < 70 High intensity statin not indicated  Continue statin at discharge  New onset A fib EKG with A fib Cardiology consult  Consider starting [facility] when appropriate   Acute respiratory Failure, resolved Management per CCM   Dysphagia  On Dysphagia 3 Mech soft with nectar thick liquids. Meds whole with liquid    Other Stroke Risk Factors Advanced Age >/= 42  Obesity, Body mass index is 34.84 kg/m., BMI >/= 30 associated with increased stroke risk, recommend weight loss, diet and exercise as appropriate  Hx stroke/TIA  Other Active Problems and Issues Anxiety  Anemia Hgb drifting down - 12.4->11.2->10.9-10.3 CCM following - appreciate assistance  IR following - appreciate assistance   Hospital day # 4   Beulah Gandy DNP, ACNPC-AG   I have personally obtained history,examined this patient, reviewed notes, independently viewed imaging studies, participated in medical decision making and plan of care.ROS completed by me personally and pertinent positives fully documented  I have made any additions or clarifications directly to the above note. Agree with note above.  Continue ongoing therapies.  Patient will need rehab in SNF setting as she does not have enough family support at home.  Discussed with rehab coordinator.  Antony Contras, MD Medical Director [facility] Pager: [phone] 12/13/2021 3:04 PM   To contact Stroke Continuity provider, please refer to [email or web address] After hours, contact General Neurology

## 2021-12-13 NOTE — Care Management Important Message (Signed)
Important Message  Patient Details  Name: Janice Curtis MRN: [id number] Date of Birth: [birth year]   Medicare Important Message Given:  Yes     Orbie Pyo 12/13/2021, 1:59 PM

## 2021-12-13 NOTE — NC FL2 (Addendum)
[place] LEVEL OF CARE SCREENING TOOL     IDENTIFICATION  Patient Name: Janice Curtis Birthdate: [birth year] Sex: female Admission Date (Current Location): 12/09/2021  [facility] and [place] Number:  [employer] and Address:  [facility], [place] [address], [place], [place] [address]      Provider Number: [id number]  Attending Physician Name and Address:  Stroke, Md, MD  Relative Name and Phone Number:       Current Level of Care: Hospital Recommended Level of Care: [place] Prior Approval Number:    Date Approved/Denied:   PASRR Number: [id number]  Discharge Plan: SNF    Current Diagnoses: Patient Active Problem List   Diagnosis Date Noted   Acute ischemic stroke ([place]) 12/09/2021   Middle cerebral artery embolism, left 12/09/2021   CVA (cerebral vascular accident) ([place]) 10/20/2020   VITAMIN D DEFICIENCY 12/08/2008   HYPERCHOLESTEROLEMIA 12/08/2008   HYPERTENSION 12/08/2008   History of malignant neoplasm of large intestine 12/08/2008   HYPOKALEMIA, HX OF 12/08/2008   ANEMIA, IRON DEFICIENCY, HX OF 12/08/2008    Orientation RESPIRATION BLADDER Height & Weight     Self, Place  Normal Incontinent Weight: 196 lb 10.4 oz (89.2 kg) Height:  '5\' 3"'$  (160 cm)  BEHAVIORAL SYMPTOMS/MOOD NEUROLOGICAL BOWEL NUTRITION STATUS      Incontinent Diet (see DC summary)  AMBULATORY STATUS COMMUNICATION OF NEEDS Skin   Extensive Assist Verbally Normal                       Personal Care Assistance Level of Assistance  Bathing, Feeding, Dressing Bathing Assistance: Maximum assistance Feeding assistance: Limited assistance Dressing Assistance: Maximum assistance     Functional Limitations Info  Speech     Speech Info: Impaired (dysarthria)    SPECIAL CARE FACTORS FREQUENCY  PT (By licensed PT), OT (By licensed OT)     PT Frequency: 5x/wk OT Frequency: 5x/wk            Contractures Contractures  Info: Not present    Additional Factors Info  Code Status, Allergies Code Status Info: Full Allergies Info: NKA           Current Medications (12/13/2021):  This is the current hospital active medication list Current Facility-Administered Medications  Medication Dose Route Frequency Provider Last Rate Last Admin   acetaminophen (TYLENOL) tablet 650 mg  650 mg Oral Q4H PRN Deveshwar, Willaim Rayas, MD       Or   acetaminophen (TYLENOL) 160 MG/5ML solution 650 mg  650 mg Per Tube Q4H PRN Deveshwar, Sanjeev, MD       Or   acetaminophen (TYLENOL) suppository 650 mg  650 mg Rectal Q4H PRN Luanne Bras, MD   650 mg at 12/10/21 2020   Chlorhexidine Gluconate Cloth 2 % PADS 6 each  6 each Topical Q0600 Minor, Grace Bushy, NP   6 each at 12/12/21 1136   metoprolol tartrate (LOPRESSOR) tablet 25 mg  25 mg Oral BID Geralynn Rile, MD   25 mg at 12/13/21 1130   Oral care mouth rinse  15 mL Mouth Rinse PRN Garvin Fila, MD       pantoprazole (PROTONIX) injection 40 mg  40 mg Intravenous QHS Amie [place], MD   40 mg at 12/12/21 2137   rosuvastatin (CRESTOR) tablet 40 mg  40 mg Per Tube Daily Beulah Gandy A, NP   40 mg at 12/13/21 1130   senna-docusate (Senokot-S) tablet 1  tablet  1 tablet Oral QHS PRN Amie [place], MD         Discharge Medications: Please see discharge summary for a list of discharge medications.  Relevant Imaging Results:  Relevant Lab Results:   Additional Information SS#: [id number]  Geralynn Ochs, LCSW   I have personally obtained history,examined this patient, reviewed notes, independently viewed imaging studies, participated in medical decision making and plan of care.ROS completed by me personally and pertinent positives fully documented  I have made any additions or clarifications directly to the above note. Agree with note above.    Antony Contras, MD Medical Director [place] Pager: [phone] 12/13/2021 5:24 PM

## 2021-12-14 DIAGNOSIS — I639 Cerebral infarction, unspecified: Secondary | ICD-10-CM | POA: Diagnosis not present

## 2021-12-14 LAB — SARS CORONAVIRUS 2 BY RT PCR: SARS Coronavirus 2 by RT PCR: NEGATIVE

## 2021-12-14 MED ORDER — ASPIRIN 81 MG PO CHEW
81.0000 mg | CHEWABLE_TABLET | Freq: Once | ORAL | Status: AC
  Administered 2021-12-14: 81 mg via ORAL
  Filled 2021-12-14: qty 1

## 2021-12-14 MED ORDER — PANTOPRAZOLE SODIUM 40 MG PO TBEC
40.0000 mg | DELAYED_RELEASE_TABLET | Freq: Every day | ORAL | Status: DC
  Administered 2021-12-14: 40 mg via ORAL
  Filled 2021-12-14: qty 1

## 2021-12-14 NOTE — Plan of Care (Signed)
  Problem: Education: Goal: Understanding of CV disease, CV risk reduction, and recovery process will improve Outcome: Progressing Goal: Individualized Educational Video(s) Outcome: Progressing   Problem: Activity: Goal: Ability to return to baseline activity level will improve Outcome: Progressing   Problem: Cardiovascular: Goal: Ability to achieve and maintain adequate cardiovascular perfusion will improve Outcome: Progressing Goal: Vascular access site(s) Level 0-1 will be maintained Outcome: Progressing   Problem: Health Behavior/Discharge Planning: Goal: Ability to safely manage health-related needs after discharge will improve Outcome: Progressing   Problem: Education: Goal: Knowledge of disease or condition will improve Outcome: Progressing Goal: Knowledge of secondary prevention will improve (SELECT ALL) Outcome: Progressing Goal: Knowledge of patient specific risk factors will improve (INDIVIDUALIZE FOR PATIENT) Outcome: Progressing Goal: Individualized Educational Video(s) Outcome: Progressing   Problem: Coping: Goal: Will verbalize positive feelings about self Outcome: Progressing Goal: Will identify appropriate support needs Outcome: Progressing   Problem: Health Behavior/Discharge Planning: Goal: Ability to manage health-related needs will improve Outcome: Progressing   Problem: Self-Care: Goal: Ability to participate in self-care as condition permits will improve Outcome: Progressing Goal: Verbalization of feelings and concerns over difficulty with self-care will improve Outcome: Progressing Goal: Ability to communicate needs accurately will improve Outcome: Progressing   Problem: Nutrition: Goal: Risk of aspiration will decrease Outcome: Progressing Goal: Dietary intake will improve Outcome: Progressing   Problem: Ischemic Stroke/TIA Tissue Perfusion: Goal: Complications of ischemic stroke/TIA will be minimized Outcome: Progressing   Problem:  Education: Goal: Knowledge of General Education information will improve Description: Including pain rating scale, medication(s)/side effects and non-pharmacologic comfort measures Outcome: Progressing   Problem: Health Behavior/Discharge Planning: Goal: Ability to manage health-related needs will improve Outcome: Progressing   Problem: Clinical Measurements: Goal: Ability to maintain clinical measurements within normal limits will improve Outcome: Progressing Goal: Will remain free from infection Outcome: Progressing Goal: Diagnostic test results will improve Outcome: Progressing Goal: Respiratory complications will improve Outcome: Progressing Goal: Cardiovascular complication will be avoided Outcome: Progressing   Problem: Activity: Goal: Risk for activity intolerance will decrease Outcome: Progressing   Problem: Nutrition: Goal: Adequate nutrition will be maintained Outcome: Progressing   Problem: Coping: Goal: Level of anxiety will decrease Outcome: Progressing   Problem: Elimination: Goal: Will not experience complications related to bowel motility Outcome: Progressing Goal: Will not experience complications related to urinary retention Outcome: Progressing   Problem: Pain Managment: Goal: General experience of comfort will improve Outcome: Progressing   Problem: Safety: Goal: Ability to remain free from injury will improve Outcome: Progressing   Problem: Skin Integrity: Goal: Risk for impaired skin integrity will decrease Outcome: Progressing   Problem: Safety: Goal: Non-violent Restraint(s) Outcome: Progressing

## 2021-12-14 NOTE — TOC Progression Note (Signed)
Transition of Care [facility]) - Progression Note    Patient Details  Name: Janice Curtis MRN: [id number] Date of Birth: [birth year]  Transition of Care [facility]) CM/SW [place], [place] Phone Number: 12/14/2021, 12:30 PM  Clinical Narrative:   [facility]. CSW requested initiation of insurance authorization. CSW to follow.    Expected Discharge Plan: [place] Barriers to Discharge: Continued Medical Work up, Ship broker  Expected Discharge Plan and Services Expected Discharge Plan: [place] Choice: [place] arrangements for the past 2 months: Single Family Home                                       Social Determinants of Health (SDOH) Interventions    Readmission Risk Interventions     No data to display

## 2021-12-14 NOTE — Progress Notes (Addendum)
STROKE TEAM PROGRESS NOTE   INTERVAL HISTORY  Patient is awake alert and interactive.  Patient aphasia and right hemiparesis continues to improve. She is watching TV this AM. Speech seems to have improved and very talkative . Therapy recommends inpatient rehab  Vital signs are stable.  Blood pressure adequately controlled.  She has new onset A-fib but rate appears to be controlled.  Follow-up CT scan from  shows some persistent but decreasing subarachnoid hemorrhage.   She denies any headaches,visual changes, dizziness, numbness, tingling or new weakness    Vitals:   12/13/21 2036 12/13/21 2304 12/14/21 0351 12/14/21 0803  BP: (!) 167/86 (!) 155/81 (!) 160/76 (!) 188/93  Pulse: 88 73 78 79  Resp: '16 16 16 17  '$ Temp: 99.4 F (37.4 C) 98.4 F (36.9 C) 98 F (36.7 C) 98.3 F (36.8 C)  TempSrc: Oral Oral Oral Oral  SpO2: 97% 97% 100% 98%  Weight:      Height:       CBC:  Recent Labs  Lab 12/09/21 1428 12/09/21 1437 12/10/21 0539 12/12/21 0200  WBC 5.2  --  6.5 6.1  NEUTROABS 1.8  --  4.1  --   HGB 12.4   < > 10.9* 10.3*  HCT 38.6   < > 33.3* 32.5*  MCV 81.4  --  81.6 83.5  PLT 181  --  179 148*   < > = values in this interval not displayed.   Basic Metabolic Panel:  Recent Labs  Lab 12/10/21 0539 12/12/21 0200  NA 136 137  K 3.9 3.6  CL 104 105  CO2 21* 24  GLUCOSE 93 97  BUN 7* 8  CREATININE 0.89 0.94  CALCIUM 8.4* 8.5*   Lipid Panel:  Recent Labs  Lab 12/10/21 0539  CHOL 196  TRIG 138  137  HDL 37*  CHOLHDL 5.3  VLDL 28  LDLCALC 131*   HgbA1c:  Recent Labs  Lab 12/09/21 1909  HGBA1C 5.7*   Urine Drug Screen: No results for input(s): "LABOPIA", "COCAINSCRNUR", "LABBENZ", "AMPHETMU", "THCU", "LABBARB" in the last 168 hours.  Alcohol Level  Recent Labs  Lab 12/09/21 1428  ETH <10    IMAGING past 24 hours No results found.  PHYSICAL EXAM  Temp:  [98 F (36.7 C)-99.4 F (37.4 C)] 98.3 F (36.8 C) (10/12 0803) Pulse Rate:  [73-88] 79  (10/12 0803) Resp:  [16-17] 17 (10/12 0803) BP: (155-188)/(76-93) 188/93 (10/12 0803) SpO2:  [97 %-100 %] 98 % (10/12 0803)  General -pleasant  Obese elderly female,Well nourished, well developed, in no apparent distress.  Cardiovascular - Afib  Mental Status -  Awake alert and interactive.  Speech is fluent and at times stops for correction. No dysarthria.  Follows commands ,cooperative and pleasant  Extraocular movements are full range pupils equal and reactive.  Blinks to threat bilaterally slight right lower facial weakness.  Tongue midline. Motor Strength - Mild right hemiparesis 4+/5 with weakness predominantly of right grip and intrinsic hand muscles.  Trace weakness of right hip flexors and ankle dorsiflexors.  Good strength on the left Motor Tone - Muscle tone was assessed at the neck and appendages and was normal.  Sensory - decreased on right arm    Coordination - intacton left  Gait and Station - deferred.   ASSESSMENT/PLAN [name] is a 81 y.o. female with history of  colon cancer, hypercholesterolemia, essential hypertension, obesity with a BMI of 34.84 kg/m, and remote CVA who presented to [facility] on 12/09/2021 via EMS as a transfer from [facility] ED for neuro IR after vessel imaging at [place] revealed a left M1 MCA occlusion.  Patient was in her usual state of health today at 13:00. Patient was given TNK and was determined to be a candidate for VIR for which transfer to [facility] was initiated.   Stroke:  Acute Left MCA ischemic infarct s/p TNK; S/p revascularization of Left M1 occlusion with TICI 3 Etiology:  likely due to new onset A fib   Code Stroke CT head  1. No acute intracranial abnormality or significant interval change. 2. Stable remote infarcts of the right corona radiata, right caudate head, and inferior left cerebellum. 3. Stable atrophy and white matter disease. This likely reflects the sequela of chronic microvascular ischemia. 4. Aspects  is 10/10. CTA head & neck  1. Proximal left M1 occlusion with limited collaterals within the anterior aspect of the left MCA distribution. 2. Moderate stenosis of the right M1 segment. 3. Segmental narrowing within distal right MCA branch vessels without a significant proximal stenosis or occlusion. 4. Moderate stenosis of the proximal right P2 segment. 5. 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 It is occluded again at the C2-3 level. 6. High-grade stenoses of the proximal internal carotid arteries bilaterally at the carotid bifurcations. 7. Moderate stenosis at the origin of the right vertebral artery Cerebral angio  Occluded left middle cerebral artery M1 segment Post IR CT No evidence of intracranial hemorrhage. MRI  12/10/21 1. Acute/subacute nonhemorrhagic infarcts involving the left lentiform nucleus and caudate. 2. Acute/subacute nonhemorrhagic infarct involving the posterior left insular cortex. 3. The remainder of the left MCA territory is spared. 4. Subarachnoid hemorrhage in the left parietal lobe. 5. Stable atrophy and white matter disease elsewhere likely reflects the sequela of chronic microvascular ischemia. 2D Echo EF 60-65%. moderate left ventricular hypertrophy of the basal-septal segment. No shunt EKG with A fib  LDL 131 HgbA1c 5.7 VTE prophylaxis - SCD's CT Head (non-con)- 12/12/21 EXAM:  CT HEAD WITHOUT CONTRAST : 12/12/21(Repeat)   FINDINGS: Brain: Cytotoxic edema in the left basal ganglia and posterior insula/sylvian fissure. Mild regional mass effect including partially effaced left lateral ventricle is stable with no midline shift.   Trace subarachnoid hemorrhage in the left hemisphere visible layering in the left sylvian fissure and also in a sulcus series 3, image 20. No convincing parenchymal hemorrhage or hemorrhagic transformation. No IVH or ventriculomegaly. Basilar cisterns remain normal.   Chronic  encephalomalacia in the left cerebellum and right occipital pole appears stable. And right hemisphere white matter hypodensity most pronounced in the corona radiata is stable.   Vascular: Calcified atherosclerosis at the skull base. No suspicious intracranial vascular hyperdensity.   Skull: Negative.   Sinuses/Orbits: Visualized paranasal sinuses and mastoids are clear.   Other: No acute orbit or scalp soft tissue finding.   IMPRESSION: 1. Expected CT appearance of the Left MCA territory infarcts since MRI yesterday. No parenchymal hemorrhagic transformation. No midline shift. 2. Trace subarachnoid hemorrhage in the left hemisphere. 3. No new intracranial abnormality.    Diet   DIET DYS 3 Room service appropriate? Yes; Fluid consistency: Nectar Thick   aspirin 81 mg daily and clopidogrel 75 mg daily prior to admission, now on No antithrombotic. Will start aspirin today. Sine repeat CT SCAN shows no new hemorrhage. Will need to consider starting AC 2/2 Afib in a few days Therapy recommendations:   SNFSNF Disposition:  pending  Hypertension Hypotension  Home meds:  amlodipine, verapamil  UnStable Was on Neo gtt @ 85mg now off Long-term BP goal normotensive  Hyperlipidemia Home meds:  crestor '10mg'$ , increased to '40mg'$ , resumed in hospital LDL 131, goal < 70 High intensity statin not indicated  Continue statin at discharge  New onset A fib EKG with A fib Cardiology consult  [facility]   Acute respiratory Failure, resolved Management per CCM   Dysphagia  Seen and Evaluated by speech with diet recommendations: Dysphagia 3 mechanical soft diet, solids , Nectar tick liquid Whole Meds with liquid Liquid administration via CUP/Straw Sit upright at 90 degrees ,Remain semi-upright after feeds/meals  Other Stroke Risk Factors Advanced Age >/= 661 Obesity, Body mass index is 34.84 kg/m., BMI >/= 30 associated with increased stroke risk, recommend  weight loss, diet and exercise as appropriate  Hx stroke/TIA  Other Active Problems and Issues Anxiety  Anemia   Hospital day # 5  Per RN note - 12/10/2021  8:24 PM "MD KLeonel Ramsaycontacted by this RN as patient has not received aspirin after 24 hour post TNK MRI was completed. Patient has failed swallow screen, however rectal aspirin may be given. Due to bleeding that was revealed on the imaging, verbal orders given to hold off on aspirin administration until tomorrow.  Verbal order from MD KLeonel Ramsaythat BP parameters may also be loosened to SBP <150 as patient is post-24 hours from procedure"  Extubated per Respiratory Therapy - 12/10/2021 10:46 AM Possible CIR candidate - patient being evaluated   Patient presented with aphasia and right hemiparesis due to left M1 occlusion likely from new onset A-fib and was treated with IV thrombolysis with TNK followed by successful mechanical thrombectomy with TICI 3 revascularization.   She is making good neurological improvement with only mild right-sided weakness remaining.. . Started aspirin  but will transition to anticoagulation with Eliquis in next few days [facility].    No family available at the bedside during rounds.       NWyman SongsterEmami , PA-C [facility]    I have personally obtained history,examined this patient, reviewed notes, independently viewed imaging studies, participated in medical decision making and plan of care.ROS completed by me personally and pertinent positives fully documented  I have made any additions or clarifications directly to the above note. Agree with note above.  Start aspirin today for stroke prevention and will transition to Eliquis in a few days and subarachnoid blood is resolved.  Continue ongoing therapies.  Transfer to skilled nursing facility when bed available PAntony Contras MD Medical Director MCaneyvillePager: [phone]/02/2022 3:50 PM    To  contact Stroke Continuity provider, please refer to [email or web address] After hours, contact General Neurology

## 2021-12-14 NOTE — Progress Notes (Signed)
Physical Therapy Treatment Patient Details Name: Janice Curtis MRN: [id number] DOB: Jul 24, 1940 Today's Date: 12/14/2021   History of Present Illness 81 y.o. female admitted on 12/09/21 for R sided weakness, aphasia.  Dx with L M1 MCA stroke s/p TNK and IR for thrombectomy.  Intubated for IR procedure on 10/7 and extubated 10/8.  Pt with significant PMH of colon CA s/p colectomy, HTN, obesity, stroke (2011).    PT Comments    Patient progressing with ambulation distance and though still quite difficult for progressing R foot not as much help needed.  She fatigues quickly with HR up to 120's and pt needing seated rest.  She seems to have supportive family/friends.  Continue to recommend acute inpatient rehab at d/c.    Recommendations for follow up therapy are one component of a multi-disciplinary discharge planning process, led by the attending physician.  Recommendations may be updated based on patient status, additional functional criteria and insurance authorization.  Follow Up Recommendations  Acute inpatient rehab (3hours/day)     Assistance Recommended at Discharge Frequent or constant Supervision/Assistance  Patient can return home with the following Assistance with cooking/housework;Assistance with feeding;Direct supervision/assist for financial management;Direct supervision/assist for medications management;Assist for transportation;Help with stairs or ramp for entrance;A little help with walking and/or transfers;A little help with bathing/dressing/bathroom   Equipment Recommendations  Rolling walker (2 wheels)    Recommendations for Other Services       Precautions / Restrictions Precautions Precautions: Fall Precaution Comments: R side weakness     Mobility  Bed Mobility               General bed mobility comments: in reclienr    Transfers Overall transfer level: Needs assistance Equipment used: Rolling walker (2 wheels) Transfers: Sit to/from Stand Sit to  Stand: Mod assist           General transfer comment: lifting help from recliner and armchair performed x 4 this session    Ambulation/Gait Ambulation/Gait assistance: Mod assist, Min assist Gait Distance (Feet): 30 Feet (& 10') Assistive device: Rolling walker (2 wheels) Gait Pattern/deviations: Step-to pattern, Decreased stance time - left, Decreased dorsiflexion - right, Shuffle, Trunk flexed       General Gait Details: dragging R foot still and leaning over to look at it; when friends arrived to visit pt more distracted and not able to keep focus on her foot so more assist for safety; one seated rest due to dyspnea and HR in 120's   Stairs             Wheelchair Mobility    Modified Rankin (Stroke Patients Only) Modified Rankin (Stroke Patients Only) Pre-Morbid Rankin Score: No symptoms Modified Rankin: Moderately severe disability     Balance Overall balance assessment: Needs assistance Sitting-balance support: Feet supported Sitting balance-Leahy Scale: Fair     Standing balance support: Bilateral upper extremity supported Standing balance-Leahy Scale: Poor Standing balance comment: min A For balance while replacing brief and assisting with hygiene in standing                            Cognition Arousal/Alertness: Awake/alert Behavior During Therapy: Flat affect Overall Cognitive Status: Impaired/Different from baseline Area of Impairment: Attention, Following commands, Safety/judgement, Problem solving                   Current Attention Level: Sustained   Following Commands: Follows one step commands consistently, Follows one step commands with  increased time Safety/Judgement: Decreased awareness of safety, Decreased awareness of deficits   Problem Solving: Slow processing, Decreased initiation, Requires verbal cues, Requires tactile cues          Exercises      General Comments        Pertinent Vitals/Pain       Home Living                          Prior Function            PT Goals (current goals can now be found in the care plan section) Progress towards PT goals: Progressing toward goals    Frequency    Min 4X/week      PT Plan Current plan remains appropriate    Co-evaluation              AM-PAC PT "6 Clicks" Mobility   Outcome Measure  Help needed turning from your back to your side while in a flat bed without using bedrails?: A Little Help needed moving from lying on your back to sitting on the side of a flat bed without using bedrails?: A Lot Help needed moving to and from a bed to a chair (including a wheelchair)?: A Lot Help needed standing up from a chair using your arms (e.g., wheelchair or bedside chair)?: A Lot Help needed to walk in hospital room?: A Lot Help needed climbing 3-5 steps with a railing? : Total 6 Click Score: 12    End of Session Equipment Utilized During Treatment: Gait belt Activity Tolerance: Patient limited by fatigue Patient left: in chair;with call bell/phone within reach;with family/visitor present Nurse Communication: Other (comment) (need for purewick) PT Visit Diagnosis: Muscle weakness (generalized) (M62.81);Difficulty in walking, not elsewhere classified (R26.2);Hemiplegia and hemiparesis Hemiplegia - Right/Left: Right Hemiplegia - dominant/non-dominant: Dominant Hemiplegia - caused by: Cerebral infarction     Time: 1210-1234 PT Time Calculation (min) (ACUTE ONLY): 24 min  Charges:  $Gait Training: 8-22 mins $Therapeutic Activity: 8-22 mins                     Magda Kiel, PT Acute Rehabilitation Services Office:[phone] 12/14/2021    Reginia Naas 12/14/2021, 4:58 PM

## 2021-12-14 NOTE — Care Management Important Message (Signed)
Important Message  Patient Details  Name: Janice Curtis MRN: [id number] Date of Birth: Jul 24, 1940   Medicare Important Message Given:  Yes     Darnisha Vernet [address] 12/14/2021, 9:54 AM

## 2021-12-14 NOTE — Progress Notes (Signed)
Speech Language Pathology Treatment: Dysphagia  Patient Details Name: Janice Curtis MRN: [id number] DOB: 30-Nov-1940 Today's Date: 12/14/2021 Time: 5621-3086 SLP Time Calculation (min) (ACUTE ONLY): 10 min  Assessment / Plan / Recommendation Clinical Impression  Pt was seen with regular solids and nectar thick liquids. She has mildly prolonged mastication with dry foods but does seem to clear them okay from her oral cavity. Could consider advancing her solids, but pt says she prefers to stay on mechanical soft. Note that she is speaking more today but does still need some repetition of instructions or questions for comprehension. No overt difficulty noted with nectar thick liquids, which were consumed without aspiration on MBS. Will leave current diet as is but will f/u for potential to try thinner liquids at least in therapy.    HPI HPI: Pt is a 81 y.o. female medical history significant for colon cancer, hypercholesterolemia, essential hypertension, obesity, and remote CVA (2011) who presented to [facility] on 12/09/2021 from [facility] ED for neuro IR after vessel imaging at [place] revealed a left M1 MCA occlusion.  Pt was found to have findings consistent with a left MCA territory infarction. MRI head found acute/subacute nonhemorrhagic infarcts involving the left lentiform nucleus caudate, posterior left insular cortex, and a subarachnoid hemorrhage in the left parietal lobe.      SLP Plan  Continue with current plan of care      Recommendations for follow up therapy are one component of a multi-disciplinary discharge planning process, led by the attending physician.  Recommendations may be updated based on patient status, additional functional criteria and insurance authorization.    Recommendations  Diet recommendations: Dysphagia 3 (mechanical soft);Nectar-thick liquid Liquids provided via: Cup;Straw Medication Administration: Whole meds with liquid Supervision: Patient able  to self feed (set-up assist) Compensations: Minimize environmental distractions;Slow rate;Small sips/bites Postural Changes and/or Swallow Maneuvers: Seated upright 90 degrees                Oral Care Recommendations: Oral care BID Follow Up Recommendations: Acute inpatient rehab (3hours/day) Assistance recommended at discharge: Intermittent Supervision/Assistance SLP Visit Diagnosis: Dysphagia, oropharyngeal phase (R13.12) Plan: Continue with current plan of care           Osie Bond., M.A. [place] Office [phone]  Secure chat preferred  12/14/2021, 3:41 PM

## 2021-12-15 DIAGNOSIS — Z6832 Body mass index (BMI) 32.0-32.9, adult: Secondary | ICD-10-CM | POA: Diagnosis not present

## 2021-12-15 DIAGNOSIS — I69391 Dysphagia following cerebral infarction: Secondary | ICD-10-CM

## 2021-12-15 DIAGNOSIS — I6602 Occlusion and stenosis of left middle cerebral artery: Secondary | ICD-10-CM | POA: Diagnosis not present

## 2021-12-15 DIAGNOSIS — R531 Weakness: Secondary | ICD-10-CM | POA: Diagnosis not present

## 2021-12-15 DIAGNOSIS — G459 Transient cerebral ischemic attack, unspecified: Secondary | ICD-10-CM | POA: Diagnosis not present

## 2021-12-15 DIAGNOSIS — R498 Other voice and resonance disorders: Secondary | ICD-10-CM | POA: Diagnosis not present

## 2021-12-15 DIAGNOSIS — R262 Difficulty in walking, not elsewhere classified: Secondary | ICD-10-CM | POA: Diagnosis not present

## 2021-12-15 DIAGNOSIS — E6609 Other obesity due to excess calories: Secondary | ICD-10-CM | POA: Diagnosis not present

## 2021-12-15 DIAGNOSIS — I4891 Unspecified atrial fibrillation: Secondary | ICD-10-CM | POA: Diagnosis not present

## 2021-12-15 DIAGNOSIS — G8191 Hemiplegia, unspecified affecting right dominant side: Secondary | ICD-10-CM | POA: Diagnosis not present

## 2021-12-15 DIAGNOSIS — K219 Gastro-esophageal reflux disease without esophagitis: Secondary | ICD-10-CM | POA: Diagnosis not present

## 2021-12-15 DIAGNOSIS — Z743 Need for continuous supervision: Secondary | ICD-10-CM | POA: Diagnosis not present

## 2021-12-15 DIAGNOSIS — I10 Essential (primary) hypertension: Secondary | ICD-10-CM | POA: Diagnosis not present

## 2021-12-15 DIAGNOSIS — I69351 Hemiplegia and hemiparesis following cerebral infarction affecting right dominant side: Secondary | ICD-10-CM | POA: Diagnosis not present

## 2021-12-15 DIAGNOSIS — R4701 Aphasia: Secondary | ICD-10-CM | POA: Diagnosis not present

## 2021-12-15 DIAGNOSIS — Z48811 Encounter for surgical aftercare following surgery on the nervous system: Secondary | ICD-10-CM | POA: Diagnosis not present

## 2021-12-15 DIAGNOSIS — Z741 Need for assistance with personal care: Secondary | ICD-10-CM | POA: Diagnosis not present

## 2021-12-15 DIAGNOSIS — E785 Hyperlipidemia, unspecified: Secondary | ICD-10-CM | POA: Diagnosis not present

## 2021-12-15 DIAGNOSIS — F411 Generalized anxiety disorder: Secondary | ICD-10-CM | POA: Diagnosis not present

## 2021-12-15 DIAGNOSIS — R1312 Dysphagia, oropharyngeal phase: Secondary | ICD-10-CM | POA: Diagnosis not present

## 2021-12-15 DIAGNOSIS — R41841 Cognitive communication deficit: Secondary | ICD-10-CM | POA: Diagnosis not present

## 2021-12-15 DIAGNOSIS — Z7401 Bed confinement status: Secondary | ICD-10-CM | POA: Diagnosis not present

## 2021-12-15 DIAGNOSIS — Z8673 Personal history of transient ischemic attack (TIA), and cerebral infarction without residual deficits: Secondary | ICD-10-CM | POA: Diagnosis not present

## 2021-12-15 DIAGNOSIS — I639 Cerebral infarction, unspecified: Secondary | ICD-10-CM | POA: Diagnosis not present

## 2021-12-15 DIAGNOSIS — D649 Anemia, unspecified: Secondary | ICD-10-CM | POA: Diagnosis not present

## 2021-12-15 DIAGNOSIS — E669 Obesity, unspecified: Secondary | ICD-10-CM | POA: Diagnosis not present

## 2021-12-15 DIAGNOSIS — E782 Mixed hyperlipidemia: Secondary | ICD-10-CM | POA: Diagnosis not present

## 2021-12-15 DIAGNOSIS — M6281 Muscle weakness (generalized): Secondary | ICD-10-CM | POA: Diagnosis not present

## 2021-12-15 DIAGNOSIS — R278 Other lack of coordination: Secondary | ICD-10-CM | POA: Diagnosis not present

## 2021-12-15 DIAGNOSIS — Z23 Encounter for immunization: Secondary | ICD-10-CM | POA: Diagnosis not present

## 2021-12-15 DIAGNOSIS — R1310 Dysphagia, unspecified: Secondary | ICD-10-CM

## 2021-12-15 MED ORDER — METOPROLOL TARTRATE 25 MG PO TABS: 25.0000 mg | ORAL_TABLET | Freq: Two times a day (BID) | ORAL | 0 refills | Status: DC

## 2021-12-15 MED ORDER — ROSUVASTATIN CALCIUM 40 MG PO TABS: 40.0000 mg | ORAL_TABLET | Freq: Every day | ORAL | 0 refills | Status: DC

## 2021-12-15 MED ORDER — APIXABAN 5 MG PO TABS: 5.0000 mg | ORAL_TABLET | Freq: Two times a day (BID) | ORAL | 0 refills | Status: DC

## 2021-12-15 MED ORDER — PANTOPRAZOLE SODIUM 40 MG PO TBEC: 40.0000 mg | DELAYED_RELEASE_TABLET | Freq: Every day | ORAL | 0 refills | Status: DC

## 2021-12-15 MED ORDER — APIXABAN 5 MG PO TABS
5.0000 mg | ORAL_TABLET | Freq: Two times a day (BID) | ORAL | Status: DC
  Administered 2021-12-15: 5 mg via ORAL
  Filled 2021-12-15: qty 1

## 2021-12-15 NOTE — Progress Notes (Signed)
Mobility Specialist: Progress Note   12/15/21 1213  Mobility  Activity Ambulated with assistance in hallway  Level of Assistance Minimal assist, patient does 75% or more  Assistive Device Front wheel walker  Distance Ambulated (ft) 80 ft  Activity Response Tolerated well  $Mobility charge 1 Mobility   During Mobility: 130s-140s HR Post-Mobility: 92 HR  Pt received in the chair and agreeable to mobility. Required minA to stand. Increased R foot drag with distance during ambulation. Verbal cues for "big steps." Stopped x2 for standing breaks secondary to fatigue and cueing pt to reset in the RW. Verbal cues for RW management and proximity. Pt back to the chair after session with call bell and phone in reach.   [place] Janice Curtis Mobility Specialist Secure Chat Only

## 2021-12-15 NOTE — Plan of Care (Signed)
  Problem: Education: Goal: Understanding of CV disease, CV risk reduction, and recovery process will improve Outcome: Progressing Goal: Individualized Educational Video(s) Outcome: Progressing   Problem: Activity: Goal: Ability to return to baseline activity level will improve Outcome: Progressing   Problem: Cardiovascular: Goal: Ability to achieve and maintain adequate cardiovascular perfusion will improve Outcome: Progressing Goal: Vascular access site(s) Level 0-1 will be maintained Outcome: Progressing   Problem: Health Behavior/Discharge Planning: Goal: Ability to safely manage health-related needs after discharge will improve Outcome: Progressing   Problem: Education: Goal: Knowledge of disease or condition will improve Outcome: Progressing Goal: Knowledge of secondary prevention will improve (SELECT ALL) Outcome: Progressing Goal: Knowledge of patient specific risk factors will improve (INDIVIDUALIZE FOR PATIENT) Outcome: Progressing Goal: Individualized Educational Video(s) Outcome: Progressing   Problem: Coping: Goal: Will verbalize positive feelings about self Outcome: Progressing Goal: Will identify appropriate support needs Outcome: Progressing   Problem: Health Behavior/Discharge Planning: Goal: Ability to manage health-related needs will improve Outcome: Progressing   Problem: Self-Care: Goal: Ability to participate in self-care as condition permits will improve Outcome: Progressing Goal: Verbalization of feelings and concerns over difficulty with self-care will improve Outcome: Progressing Goal: Ability to communicate needs accurately will improve Outcome: Progressing   Problem: Nutrition: Goal: Risk of aspiration will decrease Outcome: Progressing Goal: Dietary intake will improve Outcome: Progressing   Problem: Ischemic Stroke/TIA Tissue Perfusion: Goal: Complications of ischemic stroke/TIA will be minimized Outcome: Progressing   Problem:  Education: Goal: Knowledge of General Education information will improve Description: Including pain rating scale, medication(s)/side effects and non-pharmacologic comfort measures Outcome: Progressing   Problem: Health Behavior/Discharge Planning: Goal: Ability to manage health-related needs will improve Outcome: Progressing   Problem: Clinical Measurements: Goal: Ability to maintain clinical measurements within normal limits will improve Outcome: Progressing Goal: Will remain free from infection Outcome: Progressing Goal: Diagnostic test results will improve Outcome: Progressing Goal: Respiratory complications will improve Outcome: Progressing Goal: Cardiovascular complication will be avoided Outcome: Progressing   Problem: Activity: Goal: Risk for activity intolerance will decrease Outcome: Progressing   Problem: Nutrition: Goal: Adequate nutrition will be maintained Outcome: Progressing   Problem: Coping: Goal: Level of anxiety will decrease Outcome: Progressing   Problem: Elimination: Goal: Will not experience complications related to bowel motility Outcome: Progressing Goal: Will not experience complications related to urinary retention Outcome: Progressing   Problem: Pain Managment: Goal: General experience of comfort will improve Outcome: Progressing   Problem: Safety: Goal: Ability to remain free from injury will improve Outcome: Progressing   Problem: Skin Integrity: Goal: Risk for impaired skin integrity will decrease Outcome: Progressing   Problem: Safety: Goal: Non-violent Restraint(s) Outcome: Progressing

## 2021-12-15 NOTE — Discharge Summary (Addendum)
Stroke Discharge Summary  Patient ID: Janice Curtis   MRN: [id number]      DOB: 05/24/1940  Date of Admission: 12/09/2021 Date of Discharge: 12/15/2021  Attending Physician:  Stroke, Md, MD, Stroke MD Consultant(s):    cardiology  Patient's PCP:  [facility]  DISCHARGE DIAGNOSIS:  Principal Problem:     Acute Left MCA ischemic infarct s/p TNK; S/p revascularization of Left M1 occlusion with TICI 3 Etiology:  likely due to new onset A fib   Active Problems:   Essential hypertension   Acute ischemic stroke ([place])   Hyperlipidemia   Dysphagia   New onset atrial fibrillation ([place])   Allergies as of 12/15/2021   No Known Allergies      Medication List     STOP taking these medications    aspirin EC 81 MG tablet   clopidogrel 75 MG tablet Commonly known as: PLAVIX   oxybutynin 5 MG tablet Commonly known as: DITROPAN   verapamil 180 MG 24 hr capsule Commonly known as: VERELAN PM       TAKE these medications    amLODipine 5 MG tablet Commonly known as: NORVASC Take 5 mg by mouth daily.   apixaban 5 MG Tabs tablet Commonly known as: ELIQUIS Take 1 tablet (5 mg total) by mouth 2 (two) times daily.   metoprolol tartrate 25 MG tablet Commonly known as: LOPRESSOR Take 1 tablet (25 mg total) by mouth 2 (two) times daily.   pantoprazole 40 MG tablet Commonly known as: PROTONIX Take 1 tablet (40 mg total) by mouth at bedtime.   rosuvastatin 40 MG tablet Commonly known as: CRESTOR Place 1 tablet (40 mg total) into feeding tube daily. Start taking on: December 16, 2021 What changed:  medication strength how much to take how to take this when to take this additional instructions        LABORATORY STUDIES CBC    Component Value Date/Time   WBC 6.1 12/12/2021 0200   RBC 3.89 12/12/2021 0200   HGB 10.3 (L) 12/12/2021 0200   HCT 32.5 (L) 12/12/2021 0200   PLT 148 (L) 12/12/2021 0200   MCV 83.5 12/12/2021 0200   MCH 26.5  12/12/2021 0200   MCHC 31.7 12/12/2021 0200   RDW 14.4 12/12/2021 0200   LYMPHSABS 1.9 12/10/2021 0539   MONOABS 0.5 12/10/2021 0539   EOSABS 0.0 12/10/2021 0539   BASOSABS 0.0 12/10/2021 0539   CMP    Component Value Date/Time   NA 137 12/12/2021 0200   K 3.6 12/12/2021 0200   CL 105 12/12/2021 0200   CO2 24 12/12/2021 0200   GLUCOSE 97 12/12/2021 0200   BUN 8 12/12/2021 0200   CREATININE 0.94 12/12/2021 0200   CALCIUM 8.5 (L) 12/12/2021 0200   PROT 7.6 12/09/2021 1428   ALBUMIN 3.9 12/09/2021 1428   AST 32 12/09/2021 1428   ALT 23 12/09/2021 1428   ALKPHOS 69 12/09/2021 1428   BILITOT 0.7 12/09/2021 1428   GFRNONAA >60 12/12/2021 0200   GFRAA >60 08/14/2015 1815   COAGS Lab Results  Component Value Date   INR 1.0 12/09/2021   INR 1.1 10/19/2020   INR 0.96 12/05/2009   Lipid Panel    Component Value Date/Time   CHOL 196 12/10/2021 0539   TRIG 138 12/10/2021 0539   TRIG 137 12/10/2021 0539   HDL 37 (L) 12/10/2021 0539   CHOLHDL 5.3 12/10/2021 0539   VLDL 28 12/10/2021 0539   LDLCALC 131 (  H) 12/10/2021 0539   HgbA1C  Lab Results  Component Value Date   HGBA1C 5.7 (H) 12/09/2021   Urinalysis    Component Value Date/Time   COLORURINE STRAW (A) 10/20/2020 0316   APPEARANCEUR CLEAR 10/20/2020 0316   LABSPEC 1.042 (H) 10/20/2020 0316   PHURINE 6.0 10/20/2020 0316   GLUCOSEU NEGATIVE 10/20/2020 0316   HGBUR NEGATIVE 10/20/2020 0316   BILIRUBINUR NEGATIVE 10/20/2020 0316   KETONESUR NEGATIVE 10/20/2020 0316   PROTEINUR NEGATIVE 10/20/2020 0316   UROBILINOGEN 0.2 11/11/2011 1026   NITRITE NEGATIVE 10/20/2020 0316   LEUKOCYTESUR TRACE (A) 10/20/2020 0316   Urine Drug Screen     Component Value Date/Time   LABOPIA NONE DETECTED 10/20/2020 0316   COCAINSCRNUR NONE DETECTED 10/20/2020 0316   LABBENZ NONE DETECTED 10/20/2020 0316   AMPHETMU NONE DETECTED 10/20/2020 0316   THCU NONE DETECTED 10/20/2020 0316   LABBARB NONE DETECTED 10/20/2020 0316     Alcohol Level    Component Value Date/Time   ETH <10 12/09/2021 1428     SIGNIFICANT DIAGNOSTIC STUDIES IR PERCUTANEOUS ART THROMBECTOMY/INFUSION INTRACRANIAL INC DIAG ANGIO  Result Date: 12/14/2021 INDICATION: New onset right-sided weakness with left gaze deviation and aphasia. Occluded left middle cerebral artery M1 segment on CT angiogram of the head and neck. EXAM: 1. EMERGENT LARGE VESSEL OCCLUSION THROMBOLYSIS (anterior CIRCULATION) COMPARISON:  CT angiogram of the head and neck of Dec 09, 2021. MEDICATIONS: Ancef 2 g IV antibiotic was administered within 1 hour of the procedure. ANESTHESIA/SEDATION: General anesthesia. CONTRAST:  Omnipaque 300 80 mL. FLUOROSCOPY TIME:  Fluoroscopy Time: 25 minutes 18 seconds (911.6 mGy). COMPLICATIONS: None immediate. TECHNIQUE: Following a full explanation of the procedure along with the potential associated complications, an informed witnessed consent was obtained. The risks of intracranial hemorrhage of 10%, worsening neurological deficit, ventilator dependency, death and inability to revascularize were all reviewed in detail with the patient's niece. The patient was then put under general anesthesia by the Department of Anesthesiology at [facility]. The right groin was prepped and draped in the usual sterile fashion. Thereafter using modified Seldinger technique, transfemoral access into the right common femoral artery was obtained without difficulty. Over a 0.035 inch guidewire an 8 French 25 cm Pinnacle sheath was inserted. Through this, and also over a 0.035 inch guidewire a combination of an 087 95 cm balloon guide catheter inside of which was a 5.5 [place] Simmons 2 support catheter was advanced and selectively positioned in the left common carotid artery. Arteriogram through the support catheter demonstrates stenosis of the left external carotid artery with patency of its branches. The left internal carotid artery at the bulb demonstrates  approximately 50% stenosis just distal to the bulb with a small ulceration proximally. Over the support catheter the 035 inch glidewire, the balloon guide catheter was advanced to the distal left common carotid artery. The guidewire, and the support catheter were removed. Good aspiration was obtained from the hub of the balloon guide catheter. Gentle control arteriogram demonstrated no evidence of spasms, dissections or of intraluminal filling defects. FINDINGS: Moderate tortuosity is noted of the proximal left internal carotid artery with brisk flow into the left cervical petrous junction. The petrous, the cavernous and the supraclinoid segments demonstrate wide patency. Left anterior cerebral artery opacifies into the capillary and venous phases. The left middle cerebral artery demonstrates complete occlusion at the mid to distal M1 segment. PROCEDURE: The balloon guide catheter was then advanced into the left internal carotid artery proximally. Over an 018 inch Aristotle  micro guidewire with a moderate J configuration a combination of an 021 microcatheter inside of an 071 aspiration catheter was advanced without difficulty to the supraclinoid left ICA. Using a torque device, the micro guidewire was then gently advanced through the left middle cerebral artery into the inferior division M2 M3 region of the microcatheter. The guidewire was removed. Good aspiration was obtained from hub of the microcatheter. Good aspiration was obtained from hub of the micro catheter. This was then connected to continuous heparinized saline infusion. A 4 mm x 40 mm Solitaire X retrieval device was then to the distal end of the microcatheter and deployed in the usual manner. The Zoom aspiration catheter was then advanced into the occluded left middle cerebral artery. With proximal flow arrest in the left internal carotid artery aspiration at the hub of the Zoom aspiration catheter, and the hub of the balloon guide catheter with a 20  mL syringe. The combination of the retrieval device, microcatheter and then management were retrieved and removed. Following reversal of flow arrest, the balloon guide catheter in the proximal internal demonstrated complete revascularization of the left middle cerebral artery distribution achieving a TICI 3 revascularization. Spasm in the proximal middle cerebral responded to 25 mcg of nitroglycerin with significant improvement. The left anterior cerebral artery maintained wide patency. The balloon guide catheter was retrieved into the left common carotid artery. A control arteriogram performed through this continued to demonstrate moderate stenosis in the origin of the left external carotid artery. The 50% stenosis in the proximal left internal carotid artery remained stable. The balloon guide catheter was removed. An 8 French Angio-Seal closure device deployed for hemostasis at the right puncture. Distal pulses remained Dopplerable in both feet unchanged. A CT of the brain demonstrated no evidence of intracranial hemorrhage. Patient was left intubated due to her preprocedural neurological condition and awaiting a COVID test result. Patient was then transferred to the neuro ICU for post revascularization care. IMPRESSION: Status post endovascular revascularization of the left middle cerebral artery M1 occlusion with 1 pass with a 4 mm 40 mm Solitaire X retrieval device and contact aspiration achieving a TICI 3 revascularization. PLAN: Following up in 6 months with ultrasound of the carotids. Electronically Signed   By: Luanne Bras M.D.   On: 12/14/2021 08:07   IR CT Head Ltd  Result Date: 12/14/2021 INDICATION: New onset right-sided weakness with left gaze deviation and aphasia. Occluded left middle cerebral artery M1 segment on CT angiogram of the head and neck. EXAM: 1. EMERGENT LARGE VESSEL OCCLUSION THROMBOLYSIS (anterior CIRCULATION) COMPARISON:  CT angiogram of the head and neck of Dec 09, 2021.  MEDICATIONS: Ancef 2 g IV antibiotic was administered within 1 hour of the procedure. ANESTHESIA/SEDATION: General anesthesia. CONTRAST:  Omnipaque 300 80 mL. FLUOROSCOPY TIME:  Fluoroscopy Time: 25 minutes 18 seconds (911.6 mGy). COMPLICATIONS: None immediate. TECHNIQUE: Following a full explanation of the procedure along with the potential associated complications, an informed witnessed consent was obtained. The risks of intracranial hemorrhage of 10%, worsening neurological deficit, ventilator dependency, death and inability to revascularize were all reviewed in detail with the patient's niece. The patient was then put under general anesthesia by the Department of Anesthesiology at [facility]. The right groin was prepped and draped in the usual sterile fashion. Thereafter using modified Seldinger technique, transfemoral access into the right common femoral artery was obtained without difficulty. Over a 0.035 inch guidewire an 8 French 25 cm Pinnacle sheath was inserted. Through this, and also over  a 0.035 inch guidewire a combination of an 087 95 cm balloon guide catheter inside of which was a 5.5 [place] Simmons 2 support catheter was advanced and selectively positioned in the left common carotid artery. Arteriogram through the support catheter demonstrates stenosis of the left external carotid artery with patency of its branches. The left internal carotid artery at the bulb demonstrates approximately 50% stenosis just distal to the bulb with a small ulceration proximally. Over the support catheter the 035 inch glidewire, the balloon guide catheter was advanced to the distal left common carotid artery. The guidewire, and the support catheter were removed. Good aspiration was obtained from the hub of the balloon guide catheter. Gentle control arteriogram demonstrated no evidence of spasms, dissections or of intraluminal filling defects. FINDINGS: Moderate tortuosity is noted of the proximal left internal  carotid artery with brisk flow into the left cervical petrous junction. The petrous, the cavernous and the supraclinoid segments demonstrate wide patency. Left anterior cerebral artery opacifies into the capillary and venous phases. The left middle cerebral artery demonstrates complete occlusion at the mid to distal M1 segment. PROCEDURE: The balloon guide catheter was then advanced into the left internal carotid artery proximally. Over an 018 inch Aristotle micro guidewire with a moderate J configuration a combination of an 021 microcatheter inside of an 071 aspiration catheter was advanced without difficulty to the supraclinoid left ICA. Using a torque device, the micro guidewire was then gently advanced through the left middle cerebral artery into the inferior division M2 M3 region of the microcatheter. The guidewire was removed. Good aspiration was obtained from hub of the microcatheter. Good aspiration was obtained from hub of the micro catheter. This was then connected to continuous heparinized saline infusion. A 4 mm x 40 mm Solitaire X retrieval device was then to the distal end of the microcatheter and deployed in the usual manner. The Zoom aspiration catheter was then advanced into the occluded left middle cerebral artery. With proximal flow arrest in the left internal carotid artery aspiration at the hub of the Zoom aspiration catheter, and the hub of the balloon guide catheter with a 20 mL syringe. The combination of the retrieval device, microcatheter and then management were retrieved and removed. Following reversal of flow arrest, the balloon guide catheter in the proximal internal demonstrated complete revascularization of the left middle cerebral artery distribution achieving a TICI 3 revascularization. Spasm in the proximal middle cerebral responded to 25 mcg of nitroglycerin with significant improvement. The left anterior cerebral artery maintained wide patency. The balloon guide catheter was  retrieved into the left common carotid artery. A control arteriogram performed through this continued to demonstrate moderate stenosis in the origin of the left external carotid artery. The 50% stenosis in the proximal left internal carotid artery remained stable. The balloon guide catheter was removed. An 8 French Angio-Seal closure device deployed for hemostasis at the right puncture. Distal pulses remained Dopplerable in both feet unchanged. A CT of the brain demonstrated no evidence of intracranial hemorrhage. Patient was left intubated due to her preprocedural neurological condition and awaiting a COVID test result. Patient was then transferred to the neuro ICU for post revascularization care. IMPRESSION: Status post endovascular revascularization of the left middle cerebral artery M1 occlusion with 1 pass with a 4 mm 40 mm Solitaire X retrieval device and contact aspiration achieving a TICI 3 revascularization. PLAN: Following up in 6 months with ultrasound of the carotids. Electronically Signed   By: Corky Downs.D.  On: 12/14/2021 08:07   DG Swallowing Func-Speech Pathology  Result Date: 12/12/2021 Table formatting from the original result was not included. Objective Swallowing Evaluation: Type of Study: MBS-Modified Barium Swallow Study  Patient Details Name: Janice Curtis MRN: [id number] Date of Birth: Jan 06, 1941 Today's Date: 12/12/2021 Time: SLP Start Time (ACUTE ONLY): 6606 -SLP Stop Time (ACUTE ONLY): 3016 SLP Time Calculation (min) (ACUTE ONLY): 20 min Past Medical History: Past Medical History: Diagnosis Date  Anxiety   Colon cancer ([place])   Hypercholesterolemia   Hypertension   Obesity   Stroke ([place]) 12/05/2009 Past Surgical History: Past Surgical History: Procedure Laterality Date  COLONOSCOPY  12/27/08  simple adenoma/inflammatory polyp at anastomosis  COLONOSCOPY  04/23/2011  Procedure: COLONOSCOPY;  Surgeon: Dorothyann Peng, MD;  Location: [facility];  Service: Endoscopy;   Laterality: N/A;  10:00  RADIOLOGY WITH ANESTHESIA N/A 12/09/2021  Procedure: RADIOLOGY WITH ANESTHESIA;  Surgeon: Radiologist, Medication, MD;  Location: [place];  Service: Radiology;  Laterality: N/A;  RIGHT COLECTOMY  11/14/2007 HPI: Pt is a 81 y.o. female medical history significant for colon cancer, hypercholesterolemia, essential hypertension, obesity, and remote CVA (2011) who presented to [facility] on 12[facility]09/2021 from [facility] for neuro IR after vessel imaging at [place] revealed a left M1 MCA occlusion.  Pt was found to have findings consistent with a left MCA territory infarction. MRI head found acute/subacute nonhemorrhagic infarcts involving the left lentiform nucleus caudate, posterior left insular cortex, and a subarachnoid hemorrhage in the left parietal lobe.  Subjective: pt alert, needs cues  Recommendations for follow up therapy are one component of a multi-disciplinary discharge planning process, led by the attending physician.  Recommendations may be updated based on patient status, additional functional criteria and insurance authorization. Assessment / Plan / Recommendation   12/12/2021  10:00 AM Clinical Impressions Clinical Impression Pt presents with a mild oropharyngeal dysphagia, also impacted by current cognitive-linguistic abilities. She has reduced bolus cohesion, allowing liquids to spill back toward her pharynx without always forming a bolus in her oral cavity first. With reduced lingual propulsion, lingual rocking, and incomplete oral clearance, this results in liquids pooling in her pyriform sinuses before she initiates a swallow. Thin liquids spill into the airway before the swallow although it primarily stays above the vocal folds (PAS 3 and 5). When she does aspirate, however, it is silent. Attempted several strategies to clear and/or prevent penetration (oral hold, chin tuck, cough, throat clear), but pt has a hard time consistently following these commands and they end up  being ineffective. She has improved airway protection with nectar thick liquids even with large, consecutive sips that fill the pyriform sinuses. Pharyngeally she seems to have reduced anterior hyoid excursion but it does not impact functionality of swallowing. Also of note, pt had some retrograde flow of barium within the esophagus but it did not reach the level of the UES. Recommend continuing Dys 3 diet and nectar thick liquids for now. SLP Visit Diagnosis Dysphagia, oropharyngeal phase (R13.12) Impact on safety and function Mild aspiration risk     12/12/2021  10:00 AM Treatment Recommendations Treatment Recommendations Therapy as outlined in treatment plan below     12/12/2021  10:00 AM Prognosis Prognosis for Safe Diet Advancement Good Barriers to Reach Goals Language deficits   12/12/2021  10:00 AM Diet Recommendations SLP Diet Recommendations Dysphagia 3 (Mech soft) solids;Nectar thick liquid Liquid Administration via Cup;Straw Medication Administration Whole meds with liquid Compensations Minimize environmental distractions;Slow rate;Small sips/bites Postural Changes Seated  upright at 90 degrees;Remain semi-upright after after feeds/meals (Comment)     12/12/2021  10:00 AM Other Recommendations Oral Care Recommendations Oral care BID Follow Up Recommendations Acute inpatient rehab (3hours/day) Assistance recommended at discharge Intermittent Supervision/Assistance Functional Status Assessment Patient has had a recent decline in their functional status and demonstrates the ability to make significant improvements in function in a reasonable and predictable amount of time.   12/12/2021  10:00 AM Frequency and Duration  Speech Therapy Frequency (ACUTE ONLY) min 2x/week Treatment Duration 2 weeks     12/12/2021  10:00 AM Oral Phase Oral Phase Impaired Oral - Nectar Cup Reduced posterior propulsion;Delayed oral transit;Decreased bolus cohesion;Weak lingual manipulation;Lingual/palatal residue Oral - Nectar Straw  Reduced posterior propulsion;Delayed oral transit;Decreased bolus cohesion;Weak lingual manipulation;Lingual/palatal residue;Lingual pumping Oral - Thin Cup Reduced posterior propulsion;Delayed oral transit;Decreased bolus cohesion;Weak lingual manipulation;Lingual/palatal residue Oral - Thin Straw Reduced posterior propulsion;Delayed oral transit;Decreased bolus cohesion;Weak lingual manipulation;Lingual/palatal residue Oral - Puree Weak lingual manipulation;Lingual pumping;Reduced posterior propulsion;Delayed oral transit Oral - Regular Impaired mastication;Weak lingual manipulation Oral - Pill [facility]    12/12/2021  10:00 AM Pharyngeal Phase Pharyngeal Phase Impaired Pharyngeal- Thin Cup Delayed swallow initiation-pyriform sinuses;Penetration/Aspiration before swallow;Reduced anterior laryngeal mobility Pharyngeal Material enters airway, CONTACTS cords and not ejected out Pharyngeal- Thin Straw Delayed swallow initiation-pyriform sinuses;Penetration/Aspiration before swallow;Reduced anterior laryngeal mobility Pharyngeal Material enters airway, passes BELOW cords without attempt by patient to eject out (silent aspiration) Pharyngeal- Puree Reduced anterior laryngeal mobility Pharyngeal- Regular Reduced anterior laryngeal mobility Pharyngeal- Pill Reduced anterior laryngeal mobility    12/12/2021  10:00 AM Cervical Esophageal Phase  Cervical Esophageal Phase [facility] Osie Bond., M.A. CCC-SLP Acute Rehabilitation Services Office [phone] Secure chat preferred 12/12/2021, 1:20 PM                     CT HEAD WO CONTRAST (5MM)  Result Date: 12/12/2021 CLINICAL DATA:  81 year old female code stroke presentation on 12/09/2021 with left M1 occlusion. Status post TNK and NIR. Left MCA infarct on MRI yesterday. Subsequent encounter. EXAM: CT HEAD WITHOUT CONTRAST TECHNIQUE: Contiguous axial images were obtained from the base of the skull through the vertex without intravenous contrast. RADIATION DOSE REDUCTION: This exam  was performed according to the departmental dose-optimization program which includes automated exposure control, adjustment of the mA and/or kV according to patient size and/or use of iterative reconstruction technique. COMPARISON:  None Available. FINDINGS: Brain: Cytotoxic edema in the left basal ganglia and posterior insula/sylvian fissure. Mild regional mass effect including partially effaced left lateral ventricle is stable with no midline shift. Trace subarachnoid hemorrhage in the left hemisphere visible layering in the left sylvian fissure and also in a sulcus series 3, image 20. No convincing parenchymal hemorrhage or hemorrhagic transformation. No IVH or ventriculomegaly. Basilar cisterns remain normal. Chronic encephalomalacia in the left cerebellum and right occipital pole appears stable. And right hemisphere white matter hypodensity most pronounced in the corona radiata is stable. Vascular: Calcified atherosclerosis at the skull base. No suspicious intracranial vascular hyperdensity. Skull: Negative. Sinuses/Orbits: Visualized paranasal sinuses and mastoids are clear. Other: No acute orbit or scalp soft tissue finding. IMPRESSION: 1. Expected CT appearance of the Left MCA territory infarcts since MRI yesterday. No parenchymal hemorrhagic transformation. No midline shift. 2. Trace subarachnoid hemorrhage in the left hemisphere. 3. No new intracranial abnormality. Electronically Signed   By: Genevie Ann M.D.   On: 12/12/2021 05:52   MR BRAIN WO CONTRAST  Result Date: 12/10/2021 CLINICAL DATA:  Stroke, follow-up.  Status  post TNK. EXAM: MRI HEAD WITHOUT CONTRAST TECHNIQUE: Multiplanar, multiecho pulse sequences of the brain and surrounding structures were obtained without intravenous contrast. COMPARISON:  CT head and CTA head and neck 12/09/2021. MR head 10/20/2020 FINDINGS: Brain: Diffusion-weighted images demonstrate acute/subacute nonhemorrhagic infarcts involving the left lentiform nucleus and  caudate. Acute nonhemorrhagic infarct is present within the posterior left insular cortex. The remainder of the left MCA territory is spared. T2 and FLAIR signal changes are associated with the areas of subacute infarction. Periventricular and subcortical T2 hyperintensities are otherwise stable. Subarachnoid hemorrhage is present in the left parietal lobe. No parenchymal hemorrhage is present. T2 hyperintensities within the central pons are stable. Remote nonhemorrhagic lacunar infarct in the medial left cerebellum is stable. A remote lacunar infarct in the anterior limb of the right internal capsule is stable. Vascular: Flow is present in the major intracranial arteries. Skull and upper cervical spine: The craniocervical junction is normal. Upper cervical spine is within normal limits. Marrow signal is unremarkable. Sinuses/Orbits: The paranasal sinuses and mastoid air cells are clear. The globes and orbits are within normal limits. IMPRESSION: 1. Acute/subacute nonhemorrhagic infarcts involving the left lentiform nucleus and caudate. 2. Acute/subacute nonhemorrhagic infarct involving the posterior left insular cortex. 3. The remainder of the left MCA territory is spared. 4. Subarachnoid hemorrhage in the left parietal lobe. 5. Stable atrophy and white matter disease elsewhere likely reflects the sequela of chronic microvascular ischemia. Electronically Signed   By: San Morelle M.D.   On: 12/10/2021 15:35   ECHOCARDIOGRAM COMPLETE  Result Date: 12/10/2021    ECHOCARDIOGRAM REPORT   Patient Name:   Janice Curtis Date of Exam: 12/10/2021 Medical Rec #:  [id number]       Height:       63.0 in Accession #:    [id number]      Weight:       196.6 lb Date of Birth:  05-08-1940       BSA:          1.920 m Patient Age:    69 years        BP:           129/73 mmHg Patient Gender: F               HR:           64 bpm. Exam Location:  Inpatient Procedure: 2D Echo, Cardiac Doppler, Color Doppler and Intracardiac             Opacification Agent Indications:    Stroke I63.9  History:        Patient has prior history of Echocardiogram examinations, most                 recent 10/20/2020. Risk Factors:Hypertension and Dyslipidemia.  Sonographer:    Darlina Sicilian RDCS Referring Phys: Amie Portland IMPRESSIONS  1. Left ventricular ejection fraction, by estimation, is 60 to 65%. The left ventricle has normal function. The left ventricle has no regional wall motion abnormalities. There is moderate left ventricular hypertrophy of the basal-septal segment. Left ventricular diastolic parameters are indeterminate.  2. Right ventricular systolic function is normal. The right ventricular size is normal. There is normal pulmonary artery systolic pressure. The estimated right ventricular systolic pressure is 34.1 mmHg.  3. The mitral valve is normal in structure. No evidence of mitral valve regurgitation. No evidence of mitral stenosis.  4. Tricuspid valve regurgitation is moderate.  5. The aortic valve is tricuspid. Aortic valve regurgitation  is mild. No aortic stenosis is present.  6. Pulmonic valve regurgitation is moderate.  7. The inferior vena cava is normal in size with greater than 50% respiratory variability, suggesting right atrial pressure of 3 mmHg. Conclusion(s)/Recommendation(s): No intracardiac source of embolism detected on this transthoracic study. Consider a transesophageal echocardiogram to exclude cardiac source of embolism if clinically indicated. FINDINGS  Left Ventricle: Left ventricular ejection fraction, by estimation, is 60 to 65%. The left ventricle has normal function. The left ventricle has no regional wall motion abnormalities. Definity contrast agent was given IV to delineate the left ventricular  endocardial borders. The left ventricular internal cavity size was normal in size. There is moderate left ventricular hypertrophy of the basal-septal segment. Left ventricular diastolic parameters are indeterminate.  Right Ventricle: The right ventricular size is normal. No increase in right ventricular wall thickness. Right ventricular systolic function is normal. There is normal pulmonary artery systolic pressure. The tricuspid regurgitant velocity is 2.52 m/s, and  with an assumed right atrial pressure of 8 mmHg, the estimated right ventricular systolic pressure is 32.4 mmHg. Left Atrium: Left atrial size was normal in size. Right Atrium: Right atrial size was normal in size. Pericardium: There is no evidence of pericardial effusion. Mitral Valve: The mitral valve is normal in structure. No evidence of mitral valve regurgitation. No evidence of mitral valve stenosis. Tricuspid Valve: The tricuspid valve is normal in structure. Tricuspid valve regurgitation is moderate . No evidence of tricuspid stenosis. Aortic Valve: The aortic valve is tricuspid. Aortic valve regurgitation is mild. No aortic stenosis is present. Pulmonic Valve: The pulmonic valve was normal in structure. Pulmonic valve regurgitation is moderate. No evidence of pulmonic stenosis. Aorta: The aortic root is normal in size and structure. Venous: The inferior vena cava is normal in size with greater than 50% respiratory variability, suggesting right atrial pressure of 3 mmHg. IAS/Shunts: No atrial level shunt detected by color flow Doppler.  LEFT VENTRICLE PLAX 2D LVIDd:         3.95 cm     Diastology LVIDs:         2.70 cm     LV e' medial:    8.11 cm/s LV PW:         0.90 cm     LV E/e' medial:  11.1 LV IVS:        1.65 cm     LV e' lateral:   7.90 cm/s LVOT diam:     1.70 cm     LV E/e' lateral: 11.4 LV SV:         41 LV SV Index:   22 LVOT Area:     2.27 cm  LV Volumes (MOD) LV vol d, MOD A2C: 67.7 ml LV vol d, MOD A4C: 76.6 ml LV vol s, MOD A2C: 27.9 ml LV vol s, MOD A4C: 32.5 ml LV SV MOD A2C:     39.8 ml LV SV MOD A4C:     76.6 ml LV SV MOD BP:      43.2 ml RIGHT VENTRICLE RV S prime:     9.14 cm/s TAPSE (M-mode): 1.1 cm LEFT ATRIUM             Index         RIGHT ATRIUM           Index LA diam:        4.20 cm 2.19 cm/m   RA Area:     15.00 cm LA Vol (A2C):  57.1 ml 29.74 ml/m  RA Volume:   32.70 ml  17.03 ml/m LA Vol (A4C):   51.6 ml 26.88 ml/m LA Biplane Vol: 55.7 ml 29.01 ml/m  AORTIC VALVE             PULMONIC VALVE LVOT Vmax:   75.00 cm/s  PR End Diast Vel: 1.96 msec LVOT Vmean:  55.200 cm/s LVOT VTI:    0.182 m  AORTA Ao Root diam: 2.50 cm Ao Asc diam:  3.30 cm MITRAL VALVE               TRICUSPID VALVE MV Area (PHT): 3.90 cm    TR Peak grad:   25.4 mmHg MV Decel Time: 195 msec    TR Vmax:        252.00 cm/s MV E velocity: 90.30 cm/s                            SHUNTS                            Systemic VTI:  0.18 m                            Systemic Diam: 1.70 cm Candee Furbish MD Electronically signed by Candee Furbish MD Signature Date/Time: 12/10/2021/12:23:29 PM    Final    DG CHEST PORT 1 VIEW  Result Date: 12/09/2021 CLINICAL DATA:  Acute respiratory failure EXAM: PORTABLE CHEST 1 VIEW COMPARISON:  Previous studies including the examination of 10/02/2017 FINDINGS: Transverse diameter of heart is increased. There are no signs of pulmonary edema or focal pulmonary consolidation. There is no pleural effusion or pneumothorax. Tip of endotracheal tube is 2.7 cm above the carina. Enteric tube is noted traversing the esophagus. IMPRESSION: There are no signs of pulmonary edema or focal pulmonary consolidation. Electronically Signed   By: Elmer Picker M.D.   On: 12/09/2021 20:06   DG Abd Portable 1V  Result Date: 12/09/2021 CLINICAL DATA:  Evaluate location of enteric tube EXAM: PORTABLE ABDOMEN - 1 VIEW COMPARISON:  None Available. FINDINGS: Tip of enteric tube is seen in the region of distal antrum/pylorus. There is contrast in pelvocaliceal systems in both kidneys residual from previous intravenous contrast administration. IMPRESSION: Tip of enteric tube is seen in the region of distal antrum/pylorus of the stomach. Electronically Signed   By:  Elmer Picker M.D.   On: 12/09/2021 20:05   CT ANGIO HEAD W OR WO CONTRAST  Result Date: 12/09/2021 CLINICAL DATA:  Right arm weakness.  Left-sided gaze. EXAM: CT ANGIOGRAPHY HEAD AND NECK TECHNIQUE: Multidetector CT imaging of the head and neck was performed using the standard protocol during bolus administration of intravenous contrast. Multiplanar CT image reconstructions and MIPs were obtained to evaluate the vascular anatomy. Carotid stenosis measurements (when applicable) are obtained utilizing NASCET criteria, using the distal internal carotid diameter as the denominator. RADIATION DOSE REDUCTION: This exam was performed according to the departmental dose-optimization program which includes automated exposure control, adjustment of the mA and/or kV according to patient size and/or use of iterative reconstruction technique. CONTRAST:  41m OMNIPAQUE IOHEXOL 350 MG/ML SOLN COMPARISON:  None Available. FINDINGS: CTA NECK FINDINGS Aortic arch: Common origin of the left common carotid artery and innominate artery noted. Atherosclerotic calcifications are present in the distal arch and at the origin the innominate artery without significant stenosis  or aneurysm. Right carotid system: Right common carotid artery is within normal limits. Dense calcifications are present bifurcation. High-grade, near occlusive stenosis is present at the bifurcation. Mild tortuosity is present in the cervical right ICA without an additional stenosis. Left carotid system: Left common carotid artery is within normal limits. High-grade proximal ICA stenosis is present. The more distal cervical ICA is mildly tortuous without additional stenosis. Vertebral arteries: The right vertebral artery is the dominant vessel. Moderate stenosis is present at the origin at the right vertebral artery. The left vertebral artery is hypoplastic. It is occluded proximally with reconstitution at the C4 level. It is occluded again at the C2-3 level.  Skeleton: Multilevel degenerative changes are present in the cervical spine. Vertebral body heights maintained. No focal osseous lesions are present. Other neck: Soft tissues the neck are otherwise unremarkable. Salivary glands are within normal limits. Thyroid is normal. No significant adenopathy is present. No focal mucosal or submucosal lesions are present. Upper chest: The lung apices are clear. Thoracic inlet is within normal limits. Review of the MIP images confirms the above findings CTA HEAD FINDINGS Anterior circulation: Atherosclerotic calcifications are present within the cavernous internal carotid arteries, right greater than left without significant stenosis through the ICA termini. Moderate stenosis is present in the right M1 segment. A1 segments are normal bilaterally. A proximal left M1 occlusion is present. Limited collaterals are present anteriorly. Segmental narrowing is present within distal right MCA branch vessels without a significant proximal stenosis or occlusion. Mild irregularity is present throughout the ACA branch vessels without a significant proximal stenosis or occlusion. Posterior circulation: The left vertebral artery is occluded. The distal V4 segment is reconstituted with some flow into the left PICA, likely retrograde. The right vertebral artery is mildly irregular without a significant stenosis. The basilar artery is mildly irregular without significant stenosis. Both posterior cerebral arteries originate from basilar tip. Moderate stenosis is present in the proximal right P2 segment. PCA branch vessels are within normal limits bilaterally. Venous sinuses: The dural sinuses are patent. The straight sinus and deep cerebral veins are intact. Cortical veins are within normal limits. No significant vascular malformation is evident. Anatomic variants: None Review of the MIP images confirms the above findings IMPRESSION: 1. Proximal left M1 occlusion with limited collaterals within  the anterior aspect of the left MCA distribution. 2. Moderate stenosis of the right M1 segment. 3. Segmental narrowing within distal right MCA branch vessels without a significant proximal stenosis or occlusion. 4. Moderate stenosis of the proximal right P2 segment. 5. 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 It is occluded again at the C2-3 level. 6. High-grade stenoses of the proximal internal carotid arteries bilaterally at the carotid bifurcations. 7. Moderate stenosis at the origin of the right vertebral artery. The above was relayed via text pager to Dr. Kerney Elbe on 12/09/2021 at 15:55. Electronically Signed   By: San Morelle M.D.   On: 12/09/2021 16:07   CT ANGIO NECK W OR WO CONTRAST  Result Date: 12/09/2021 CLINICAL DATA:  Right arm weakness.  Left-sided gaze. EXAM: CT ANGIOGRAPHY HEAD AND NECK TECHNIQUE: Multidetector CT imaging of the head and neck was performed using the standard protocol during bolus administration of intravenous contrast. Multiplanar CT image reconstructions and MIPs were obtained to evaluate the vascular anatomy. Carotid stenosis measurements (when applicable) are obtained utilizing NASCET criteria, using the distal internal carotid diameter as the denominator. RADIATION DOSE REDUCTION: This exam was performed according to  the departmental dose-optimization program which includes automated exposure control, adjustment of the mA and/or kV according to patient size and/or use of iterative reconstruction technique. CONTRAST:  29m OMNIPAQUE IOHEXOL 350 MG/ML SOLN COMPARISON:  None Available. FINDINGS: CTA NECK FINDINGS Aortic arch: Common origin of the left common carotid artery and innominate artery noted. Atherosclerotic calcifications are present in the distal arch and at the origin the innominate artery without significant stenosis or aneurysm. Right carotid system: Right common carotid artery is within normal limits. Dense  calcifications are present bifurcation. High-grade, near occlusive stenosis is present at the bifurcation. Mild tortuosity is present in the cervical right ICA without an additional stenosis. Left carotid system: Left common carotid artery is within normal limits. High-grade proximal ICA stenosis is present. The more distal cervical ICA is mildly tortuous without additional stenosis. Vertebral arteries: The right vertebral artery is the dominant vessel. Moderate stenosis is present at the origin at the right vertebral artery. The left vertebral artery is hypoplastic. It is occluded proximally with reconstitution at the C4 level. It is occluded again at the C2-3 level. Skeleton: Multilevel degenerative changes are present in the cervical spine. Vertebral body heights maintained. No focal osseous lesions are present. Other neck: Soft tissues the neck are otherwise unremarkable. Salivary glands are within normal limits. Thyroid is normal. No significant adenopathy is present. No focal mucosal or submucosal lesions are present. Upper chest: The lung apices are clear. Thoracic inlet is within normal limits. Review of the MIP images confirms the above findings CTA HEAD FINDINGS Anterior circulation: Atherosclerotic calcifications are present within the cavernous internal carotid arteries, right greater than left without significant stenosis through the ICA termini. Moderate stenosis is present in the right M1 segment. A1 segments are normal bilaterally. A proximal left M1 occlusion is present. Limited collaterals are present anteriorly. Segmental narrowing is present within distal right MCA branch vessels without a significant proximal stenosis or occlusion. Mild irregularity is present throughout the ACA branch vessels without a significant proximal stenosis or occlusion. Posterior circulation: The left vertebral artery is occluded. The distal V4 segment is reconstituted with some flow into the left PICA, likely  retrograde. The right vertebral artery is mildly irregular without a significant stenosis. The basilar artery is mildly irregular without significant stenosis. Both posterior cerebral arteries originate from basilar tip. Moderate stenosis is present in the proximal right P2 segment. PCA branch vessels are within normal limits bilaterally. Venous sinuses: The dural sinuses are patent. The straight sinus and deep cerebral veins are intact. Cortical veins are within normal limits. No significant vascular malformation is evident. Anatomic variants: None Review of the MIP images confirms the above findings IMPRESSION: 1. Proximal left M1 occlusion with limited collaterals within the anterior aspect of the left MCA distribution. 2. Moderate stenosis of the right M1 segment. 3. Segmental narrowing within distal right MCA branch vessels without a significant proximal stenosis or occlusion. 4. Moderate stenosis of the proximal right P2 segment. 5. 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 It is occluded again at the C2-3 level. 6. High-grade stenoses of the proximal internal carotid arteries bilaterally at the carotid bifurcations. 7. Moderate stenosis at the origin of the right vertebral artery. The above was relayed via text pager to Dr. EKerney Elbeon 12/09/2021 at 15:55. Electronically Signed   By: CSan MorelleM.D.   On: 12/09/2021 16:07   CT HEAD CODE STROKE WO CONTRAST  Result Date: 12/09/2021 CLINICAL DATA:  Code stroke.  Neuro deficit, acute, stroke suspected. Right arm weakness. Left-sided gaze. EXAM: CT HEAD WITHOUT CONTRAST TECHNIQUE: Contiguous axial images were obtained from the base of the skull through the vertex without intravenous contrast. RADIATION DOSE REDUCTION: This exam was performed according to the departmental dose-optimization program which includes automated exposure control, adjustment of the mA and/or kV according to patient size and/or use  of iterative reconstruction technique. COMPARISON:  MR head 10/20/2020. CT head without contrast 10/20/2020. FINDINGS: Brain: A remote infarct is again noted within the right corona radiata. A remote lacunar infarct is present in the right caudate head. Remote infarcts of the inferior left cerebellum are again noted. Mild generalized atrophy and white matter disease is otherwise stable. Basal ganglia are intact. Insular ribbon is normal bilaterally. No acute or focal cortical abnormality is present. No acute hemorrhage or mass lesion is present. Brainstem and cerebellum are otherwise within normal limits. Vascular: Atherosclerotic calcifications are present along the dural margin of the left vertebral artery. No hyperdense vessel is present. Skull: Calvarium is intact. No focal lytic or blastic lesions are present. No significant extracranial soft tissue lesion is present. Sinuses/Orbits: The paranasal sinuses and mastoid air cells are clear. The globes and orbits are within normal limits. ASPECTS [facility] Stroke Program Early CT Score) - Ganglionic level infarction (caudate, lentiform nuclei, internal capsule, insula, M1-M3 cortex): 7/7 - Supraganglionic infarction (M4-M6 cortex): 3/3 Total score (0-10 with 10 being normal): 10/10 IMPRESSION: 1. No acute intracranial abnormality or significant interval change. 2. Stable remote infarcts of the right corona radiata, right caudate head, and inferior left cerebellum. 3. Stable atrophy and white matter disease. This likely reflects the sequela of chronic microvascular ischemia. 4. Aspects is 10/10. These results were called by telephone at the time of interpretation on 12/09/2021 at 2:46 pm to provider Campbell Stall , who verbally acknowledged these results. Electronically Signed   By: San Morelle M.D.   On: 12/09/2021 14:49      HISTORY OF PRESENT ILLNESS   Janice Curtis is a 81 y.o. female with history of  colon cancer, hypercholesterolemia, essential  hypertension, obesity with a BMI of 34.84 kg/m, and remote CVA who presented to [facility] on 12/09/2021 via EMS as a transfer from [facility] ED for neuro IR after vessel imaging at [place] revealed a left M1 MCA occlusion.  Patient was in her usual state of health today at 13:00. Patient was given TNK and was determined to be a candidate for VIR for which transfer to [facility] was initiated.   HOSPITAL COURSE Stroke:  Acute Left MCA ischemic infarct s/p TNK; S/p revascularization of Left M1 occlusion with TICI 3 Etiology:  likely due to new onset A fib   Code Stroke CT head  1. No acute intracranial abnormality or significant interval change. 2. Stable remote infarcts of the right corona radiata, right caudate head, and inferior left cerebellum. 3. Stable atrophy and white matter disease. This likely reflects the sequela of chronic microvascular ischemia. 4. Aspects is 10/10. CTA head & neck  1. Proximal left M1 occlusion with limited collaterals within the anterior aspect of the left MCA distribution. 2. Moderate stenosis of the right M1 segment. 3. Segmental narrowing within distal right MCA branch vessels without a significant proximal stenosis or occlusion. 4. Moderate stenosis of the proximal right P2 segment. 5. 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 It is occluded again at the C2-3 level. 6. High-grade stenoses of the  proximal internal carotid arteries bilaterally at the carotid bifurcations. 7. Moderate stenosis at the origin of the right vertebral artery Cerebral angio  Occluded left middle cerebral artery M1 segment Post IR CT No evidence of intracranial hemorrhage. MRI  12/10/21 1. Acute/subacute nonhemorrhagic infarcts involving the left lentiform nucleus and caudate. 2. Acute/subacute nonhemorrhagic infarct involving the posterior left insular cortex. 3. The remainder of the left MCA territory is spared. 4. Subarachnoid hemorrhage in  the left parietal lobe. 5. Stable atrophy and white matter disease elsewhere likely reflects the sequela of chronic microvascular ischemia. 2D Echo EF 60-65%. moderate left ventricular hypertrophy of the basal-septal segment. No shunt EKG with A fib  LDL 131 HgbA1c 5.7  CT HEAD WITHOUT CONTRAST : 12/12/21(Repeat) 1. Expected CT appearance of the Left MCA territory infarcts since MRI yesterday. No parenchymal hemorrhagic transformation. No midline shift. 2. Trace subarachnoid hemorrhage in the left hemisphere. 3. No new intracranial abnormality.   Hypertension Hypotension  Home meds:  amlodipine, verapamil  UnStable Was on Neo gtt @ 51mg now off Long-term BP goal normotensive   Hyperlipidemia Home meds:  crestor '10mg'$ , increased to '40mg'$ , resumed in hospital LDL 131, goal < 70 High intensity statin not indicated  Continue statin at discharge   New onset A fib EKG with A fib Cardiology consult  Eliquis '5mg'$  BID started today 10/13    Dysphagia  Seen and Evaluated by speech with diet recommendations: Dysphagia 3 mechanical soft diet, solids , Nectar tick liquid Whole Meds with liquid Liquid administration via CUP/Straw Sit upright at 90 degrees ,Remain semi-upright after feeds/meals   Other Stroke Risk Factors Advanced Age >/= 639 Obesity, Body mass index is 34.84 kg/m., BMI >/= 30 associated with increased stroke risk, recommend weight loss, diet and exercise as appropriate  Hx stroke/TIA   Other Active Problems and Issues Anxiety  Anemia    DISCHARGE EXAM Blood pressure (!) 162/96, pulse 80, temperature 98.2 F (36.8 C), temperature source Oral, resp. rate 17, height '5\' 3"'$  (1.6 m), weight 89.2 kg, SpO2 98 %.  General -pleasant  Obese elderly female,Well nourished, well developed, in no apparent distress.  Cardiovascular - Afib   Mental Status -  Awake alert and interactive.  Aphasia, Speech is fluent and at times stops for correction. No dysarthria.  Follows  commands ,cooperative and pleasant  Extraocular movements are full range pupils equal and reactive.  Blinks to threat bilaterally slight right lower facial weakness.  Tongue midline. Motor Strength - Mild right hemiparesis 4+/5 with weakness predominantly of right grip and intrinsic hand muscles.  Trace weakness of right hip flexors and ankle dorsiflexors.  Good strength on the left Motor Tone - Muscle tone was assessed at the neck and appendages and was normal.  Sensory - decreased on right arm    Coordination - intacton left  Gait and Station - deferred.   Discharge Diet       Diet   DIET DYS 3 Room service appropriate? Yes; Fluid consistency: Nectar Thick   liquids  DISCHARGE PLAN Disposition:  SNF Eliquis   for secondary stroke prevention  Ongoing stroke risk factor control by Primary Care Physician at time of discharge [facility], with Dr. SRolm Bookbinderoffice to schedule an appointment.   50 minutes were spent preparing discharge.  DBeulah GandyDNP, ACNPC-AG    I have personally obtained history,examined this patient, reviewed notes, independently viewed imaging  studies, participated in medical decision making and plan of care.ROS completed by me personally and pertinent positives fully documented  I have made any additions or clarifications directly to the above note. Agree with note above.    Antony Contras, MD Medical Director [facility] Pager: [phone] 12/15/2021 12:46 PM

## 2021-12-15 NOTE — TOC Transition Note (Signed)
Transition of Care [facility]) - CM/SW Discharge Note   Patient Details  Name: VERNECIA UMBLE MRN: [id number] Date of Birth: [birth year]  Transition of Care [facility]) CM/SW Contact:  Amador Cunas, March ARB Phone Number: 12/15/2021, 12:50 PM   Clinical Narrative:  Pt for dc to [facility] today. Spoke to Bixby at [facility] who confirmed they are prepared to admit pt to room 128. Pt's dtr Katrina aware of dc and reports agreeable. [facility] arranged for transport and RN provided with number for report. SW signing off at dc.   Wandra Feinstein, MSW, LCSW [phone] (coverage)       Final next level of care: Skilled Nursing Facility Barriers to Discharge: No Barriers Identified   Patient Goals and CMS Choice Patient states their goals for this hospitalization and ongoing recovery are:: patient unable to participate in goal setting, not oriented CMS Medicare.gov Compare Post Acute Care list provided to:: Patient Represenative (must comment) Choice offered to / list presented to : Adult Children  Discharge Placement              Patient chooses bed at: [facility] Patient to be transferred to facility by: [place] Name of family member notified: Katrina/dtr Patient and family notified of of transfer: 12/15/21  Discharge Plan and Services     Post Acute Care Choice: [place]                               Social Determinants of Health (SDOH) Interventions     Readmission Risk Interventions     No data to display

## 2021-12-15 NOTE — Progress Notes (Signed)
Insurance auth received for SNF and [facility] is prepared to admit pt today if medically cleared.  Wandra Feinstein, MSW, LCSW [phone] (coverage)

## 2021-12-15 NOTE — Discharge Instructions (Signed)

## 2021-12-18 ENCOUNTER — Non-Acute Institutional Stay (SKILLED_NURSING_FACILITY): Payer: Medicare HMO | Admitting: Adult Health

## 2021-12-18 ENCOUNTER — Encounter: Payer: Self-pay | Admitting: Adult Health

## 2021-12-18 DIAGNOSIS — I10 Essential (primary) hypertension: Secondary | ICD-10-CM | POA: Diagnosis not present

## 2021-12-18 DIAGNOSIS — D649 Anemia, unspecified: Secondary | ICD-10-CM

## 2021-12-18 DIAGNOSIS — I69391 Dysphagia following cerebral infarction: Secondary | ICD-10-CM

## 2021-12-18 DIAGNOSIS — Z6832 Body mass index (BMI) 32.0-32.9, adult: Secondary | ICD-10-CM

## 2021-12-18 DIAGNOSIS — E782 Mixed hyperlipidemia: Secondary | ICD-10-CM

## 2021-12-18 DIAGNOSIS — E6609 Other obesity due to excess calories: Secondary | ICD-10-CM | POA: Diagnosis not present

## 2021-12-18 DIAGNOSIS — Z8673 Personal history of transient ischemic attack (TIA), and cerebral infarction without residual deficits: Secondary | ICD-10-CM | POA: Diagnosis not present

## 2021-12-18 DIAGNOSIS — K219 Gastro-esophageal reflux disease without esophagitis: Secondary | ICD-10-CM | POA: Diagnosis not present

## 2021-12-18 DIAGNOSIS — I4891 Unspecified atrial fibrillation: Secondary | ICD-10-CM | POA: Diagnosis not present

## 2021-12-18 NOTE — Progress Notes (Unsigned)
Location:  [place] Room Number: 160 Place of Service:  SNF (31)   CODE STATUS: full   No Known Allergies  Chief Complaint  Patient presents with   Acute Visit    Hospitalization follow up     HPI:  She is an 81 year old woman who has been hospitalized from 12-09-21 through 12-15-21. Her medical history includes: hypertension; obesity; distant colon cancer. She presented from [facility] ED with new onset of cva symptoms.  she was taken to [facility] for further interventions. She needed neuro IR after vellel imaging revealed left M1 MCA occlusion. She was given TNK and was determined to appropriate for VIR.   Stroke: left mca ischemic infarct s/p tnk; revascularization. She has stable atrophy likely reflects the sequela of chronic microvascular ischemia.  her MRI demonstrated acute/subacute infarcts involving left lentiform nucleus and caudate; posterior insular cortex  She has new onset atrial fibrillation. Has dysphagia is on nectar thick liquids she is able to move all extremities. She is here for short term rehab with her goals to return back home. She will continue to be followed for her chronic illnesses including: New onset atrial fibrillation: Mixed hyperlipidemia:  Essential hypertension: Dysphagia due to recent stroke:     Past Medical History:  Diagnosis Date   Anxiety    Colon cancer ([place])    Hypercholesterolemia    Hypertension    Obesity    Stroke ([place]) 12/05/2009    Past Surgical History:  Procedure Laterality Date   COLONOSCOPY  12/27/08   simple adenoma/inflammatory polyp at anastomosis   COLONOSCOPY  04/23/2011   Procedure: COLONOSCOPY;  Surgeon: Dorothyann Peng, MD;  [facility];  Service: Endoscopy;  Laterality: N/A;  10:00   IR CT HEAD LTD  12/09/2021   IR PERCUTANEOUS ART THROMBECTOMY/INFUSION INTRACRANIAL INC DIAG ANGIO  12/09/2021   RADIOLOGY WITH ANESTHESIA N/A 12/09/2021   Procedure: RADIOLOGY WITH ANESTHESIA;  Surgeon:  Radiologist, Medication, MD;  Location: [place];  Service: Radiology;  Laterality: N/A;   RIGHT COLECTOMY  11/14/2007    Social History   Socioeconomic History   Marital status: Divorced    Spouse name: Not on file   Number of children: Not on file   Years of education: Not on file   Highest education level: Not on file  Occupational History   Not on file  Tobacco Use   Smoking status: Never   Smokeless tobacco: Never  Vaping Use   Vaping Use: Unknown  Substance and Sexual Activity   Alcohol use: Not on file   Drug use: No   Sexual activity: Yes  Other Topics Concern   Not on file  Social History Narrative   RAISES HER GRANDSON SINCE HE WAS 2 YO. HE IS 5 YO NOW.   Social Determinants of Health   Financial Resource Strain: Not on file  Food Insecurity: Not on file  Transportation Needs: Not on file  Physical Activity: Not on file  Stress: Not on file  Social Connections: Not on file  Intimate Partner Violence: Not on file   Family History  Problem Relation Age of Onset   Heart attack Mother    Stroke Father       VITAL SIGNS BP 136/64   Pulse 74   Temp (!) 97.4 F (36.3 C)   Resp 20   Ht '5\' 3"'$  (1.6 m)   Wt 182 lb 6.4 oz (82.7 kg)   SpO2 98%  BMI 32.31 kg/m   Outpatient Encounter Medications as of 12/18/2021  Medication Sig   amLODipine (NORVASC) 5 MG tablet Take 5 mg by mouth daily.   apixaban (ELIQUIS) 5 MG TABS tablet Take 1 tablet (5 mg total) by mouth 2 (two) times daily.   food thickener (SIMPLYTHICK, NECTAR/LEVEL 2/MILDLY THICK,) GEL Take 1 packet by mouth as needed (for NECTAR thick liquids).   metoprolol tartrate (LOPRESSOR) 25 MG tablet Take 1 tablet (25 mg total) by mouth 2 (two) times daily.   pantoprazole (PROTONIX) 40 MG tablet Take 1 tablet (40 mg total) by mouth at bedtime.   rosuvastatin (CRESTOR) 40 MG tablet Place 1 tablet (40 mg total) into feeding tube daily.   No facility-administered encounter medications on file as of 12/18/2021.      SIGNIFICANT DIAGNOSTIC EXAMS  TODAY:   12-09-21: ct of head:  1. No acute intracranial abnormality or significant interval change. 2. Stable remote infarcts of the right corona radiata, right caudate head, and inferior left cerebellum. 3. Stable atrophy and white matter disease. This likely reflects the sequela of chronic microvascular ischemia. 4. Aspects is 10/10  12-09-21: cta of head and neck:  1. Proximal left M1 occlusion with limited collaterals within the anterior aspect of the left MCA distribution. 2. Moderate stenosis of the right M1 segment. 3. Segmental narrowing within distal right MCA branch vessels without a significant proximal stenosis or occlusion. 4. Moderate stenosis of the proximal right P2 segment. 5. 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 It is occluded again at the C2-3 level. 6. High-grade stenoses of the proximal internal carotid arteries bilaterally at the carotid bifurcations. 7. Moderate stenosis at the origin of the right vertebral artery.  12-09-21: chest x-ray: There are no signs of pulmonary edema or focal pulmonary consolidation.  12-10-21: 2-d echo:   1. Left ventricular ejection fraction, by estimation, is 60 to 65%. The  left ventricle has normal function. The left ventricle has no regional  wall motion abnormalities. There is moderate left ventricular hypertrophy  of the basal-septal segment. Left  ventricular diastolic parameters are indeterminate.   2. Right ventricular systolic function is normal. The right ventricular  size is normal. There is normal pulmonary artery systolic pressure. The  estimated right ventricular systolic pressure is 73.7 mmHg.   3. The mitral valve is normal in structure. No evidence of mitral valve  regurgitation. No evidence of mitral stenosis.   4. Tricuspid valve regurgitation is moderate.   5. The aortic valve is tricuspid. Aortic valve regurgitation is mild. No  aortic  stenosis is present.   6. Pulmonic valve regurgitation is moderate.   7. The inferior vena cava is normal in size with greater than 50%  respiratory variability, suggesting right atrial pressure of 3 mmHg.   12-10-21: MRI of head: 1. Acute/subacute nonhemorrhagic infarcts involving the left lentiform nucleus and caudate. 2. Acute/subacute nonhemorrhagic infarct involving the posterior left insular cortex. 3. The remainder of the left MCA territory is spared. 4. Subarachnoid hemorrhage in the left parietal lobe. 5. Stable atrophy and white matter disease elsewhere likely reflects the sequela of chronic microvascular ischemia.  12-12-21: ct of head:  1. Expected CT appearance of the Left MCA territory infarcts since MRI yesterday. No parenchymal hemorrhagic transformation. No midline shift. 2. Trace subarachnoid hemorrhage in the left hemisphere. 3. No new intracranial abnormality.  12-12-21: swallow study: Dysphagia 3 (Mech soft) solids;Nectar thick liquid   LABS REVIEWED TODAY:  12-09-21: wbc 5.2; hgb 12.4; hct 38.2; mcv 81.4 plt 181; glucose 114; bun 12; creat 0.90; k+ 3.2; na++ 134; ca 8.6; gfr >60; protein 7.6 albumin 3.9 hgb a1c 5.7 12-10-21: chol 196; ldl 131; trig 138; hdl 37 12-12-21: wbc 6.1; hgb 10.3; hct 32.5; mcv 83.5 plt 148; glucose 97; bun 8; creat 0.94; k+ 3.6; na++ 137; a 8.5; gfr >60  Review of Systems  Constitutional:  Negative for malaise/fatigue.  Respiratory:  Negative for cough and shortness of breath.   Cardiovascular:  Negative for chest pain, palpitations and leg swelling.  Gastrointestinal:  Negative for abdominal pain, constipation and heartburn.  Musculoskeletal:  Negative for back pain, joint pain and myalgias.  Skin: Negative.   Neurological:  Negative for dizziness.  Psychiatric/Behavioral:  The patient is not nervous/anxious.      Physical Exam Constitutional:      General: She is not in acute distress.    Appearance: She is well-developed. She is  obese. She is not diaphoretic.  Neck:     Thyroid: No thyromegaly.  Cardiovascular:     Rate and Rhythm: Normal rate and regular rhythm.     Pulses: Normal pulses.     Heart sounds: Normal heart sounds.  Pulmonary:     Effort: Pulmonary effort is normal. No respiratory distress.     Breath sounds: Normal breath sounds.  Abdominal:     General: Bowel sounds are normal. There is no distension.     Palpations: Abdomen is soft.     Tenderness: There is no abdominal tenderness.  Musculoskeletal:        General: Normal range of motion.     Cervical back: Neck supple.     Right lower leg: No edema.     Left lower leg: No edema.     Comments: Has good strength in all extremities   Lymphadenopathy:     Cervical: No cervical adenopathy.  Skin:    General: Skin is warm and dry.  Neurological:     Mental Status: She is alert. Mental status is at baseline.  Psychiatric:        Mood and Affect: Mood normal.       ASSESSMENT/ PLAN:  TODAY  History of ischemic cva: is neurologically stable; will continue pt/ot/st to improve upon her level of independence with her ads and improve on her swallowing.   2. New onset atrial fibrillation: heart rate is stable; will continue eliquis 5 mg twice daily is on lopressor 25 mg twice for rate control  3. Mixed hyperlipidemia: ldl 131: will continue crestor 40 mg daily   4. Essential hypertension: b/p 133/64 will continue norvasc 5 mg daily and lopressor 25 mg twice daily   5. Dysphagia due to recent stroke: no indications of aspiration present; will continue nectar thick liquids; is followed by speech therapy  6. GERD without esophagitis: will continue protonix 40 mg daily   7. Class 1 obesity due to excess calories with serious comorbidity with body mass index (BMI)32.0 through 32.9: has hypertension; mixed hyperlipidemia; recent cva  8. Normochromic anemia: hgb 10.3 will monitor        Ok Edwards NP [facility]   call  [phone]

## 2021-12-20 ENCOUNTER — Non-Acute Institutional Stay (SKILLED_NURSING_FACILITY): Payer: Medicare HMO | Admitting: Internal Medicine

## 2021-12-20 ENCOUNTER — Other Ambulatory Visit: Payer: Self-pay | Admitting: *Deleted

## 2021-12-20 ENCOUNTER — Encounter: Payer: Self-pay | Admitting: Internal Medicine

## 2021-12-20 DIAGNOSIS — I4891 Unspecified atrial fibrillation: Secondary | ICD-10-CM

## 2021-12-20 DIAGNOSIS — I639 Cerebral infarction, unspecified: Secondary | ICD-10-CM

## 2021-12-20 DIAGNOSIS — I10 Essential (primary) hypertension: Secondary | ICD-10-CM | POA: Diagnosis not present

## 2021-12-20 NOTE — Patient Instructions (Signed)
See assessment and plan under each diagnosis in the problem list and acutely for this visit 

## 2021-12-20 NOTE — Patient Outreach (Signed)
Janice Curtis resides in [facility] per [facility]. Screening for potential [facility] care coordination services as benefit of insurance plan and PCP.   Collaboration with [facility] SW who reports Janice Curtis will return home post SNF. Has supportive daughter who works during the day. Care plan meeting scheduled for thisFriday. Will know more regarding transition plans at that time.  Will continue to follow for potential [facility] needs.    Marthenia Rolling, MSN, RN,BSN [place] Acute Care Coordinator [phone] (Direct dial)

## 2021-12-20 NOTE — Assessment & Plan Note (Signed)
Rhythm is slightly irregular but rate is well controlled.  No change indicated.Continue Eliquis.

## 2021-12-20 NOTE — Assessment & Plan Note (Signed)
Systolic is mildly elevated; average will be verified to help optimize antihypertensive regimen.

## 2021-12-20 NOTE — Assessment & Plan Note (Signed)
Speech therapy to follow at SNF.  PT/OT as tolerated.  Prophylactic Eliquis continued.

## 2021-12-20 NOTE — Progress Notes (Unsigned)
NURSING HOME LOCATION:  [facility] ROOM NUMBER:  [id number]  CODE STATUS:  Full Code  PCP:  [place]  This is a comprehensive admission note to this SNFperformed on this date less than 30 days from date of admission. Included are preadmission medical/surgical history; reconciled medication list; family history; social history and comprehensive review of systems.  Corrections and additions to the records were documented. Comprehensive physical exam was also performed. Additionally a clinical summary was entered for each active diagnosis pertinent to this admission in the Problem List to enhance continuity of care.  HPI: She was hospitalized 10/7 - 12/15/2021 with an acute left MCA ischemic infarct for which revascularization of left M1 occlusion with TICI3 in the context text of new onset A-fib.  She presented with left gaze deviation and aphasia.  CT revealed no acute intracranial abnormality.  There was evidence of stable remote infarcts of the right corona radiata, right caudate head, and inferior left cerebellum.  Atrophy and white matter disease was stable based on serial imaging. Speech therapy consulted for dysphagia; dysphagia 3 mechanical soft diet was recommended with nectar thick liquids. Normochromic, normocytic anemia was present with H/H of 10.3/32.5.  Platelet count was mildly reduced at 148,000.  Mild hypocalcemia was present with a value of 8.5.  Lipid panel revealed a reduced HDL at 37 and LDL of 131.  A1c was prediabetic at 5.7%. She was discharged to the SNF for rehab.  Past medical and surgical history includes history of colon cancer, dyslipidemia, history of stroke, and essential hypertension. Surgeries and procedures include colonoscopy and right colectomy.  Social history: Non-smoker and nondrinker.  Family history: Noncontributory due to advanced age.   Review of systems: She validated that she had had "a stroke" but she was unaware  of the IR procedure.  She has residual weakness in the right lower extremity but denies other neuro or cardiac symptoms.  She does validate "a little bit of depression."  Constitutional: No fever, significant weight change, fatigue  Eyes: No redness, discharge, pain, vision change ENT/mouth: No nasal congestion, purulent discharge, earache, change in hearing, sore throat  Cardiovascular: No chest pain, palpitations, paroxysmal nocturnal dyspnea, claudication, edema  Respiratory: No cough, sputum production, hemoptysis, DOE, significant snoring, apnea Gastrointestinal: No heartburn, dysphagia, abdominal pain, nausea /vomiting, rectal bleeding, melena, change in bowels Genitourinary: No dysuria, hematuria, pyuria, incontinence, nocturia Musculoskeletal: No joint stiffness, joint swelling, weakness, pain Dermatologic: No rash, pruritus, change in appearance of skin Neurologic: No dizziness, headache, syncope, seizures, numbness, tingling Psychiatric: No significant anxiety, depression, insomnia, anorexia Endocrine: No change in hair/skin/nails, excessive thirst, excessive hunger, excessive urination  Hematologic/lymphatic: No significant bruising, lymphadenopathy, abnormal bleeding Allergy/immunology: No itchy/watery eyes, significant sneezing, urticaria, angioedema  Physical exam:  Pertinent or positive findings: She was initially asleep exhibiting hypopnea without snoring or apnea.  She woke easily when spoken to.  Pattern alopecia is present.  Eyebrows are markedly decreased in density.  She has an upper plate.  Heart rhythm is slightly irregular but rate is slow.  Abdomen is protuberant.  Pedal pulses are palpable.  She has lipedema at the ankles.  There is suggestion of a corn at the medial base of the first toes bilaterally.  Strength to opposition is clinically equal in all extremities. General appearance: Adequately nourished; no acute distress, increased work of breathing is present.    Lymphatic: No lymphadenopathy about the head, neck, axilla. Eyes: No conjunctival inflammation or lid edema is present. There is no  scleral icterus. Ears:  External ear exam shows no significant lesions or deformities.   Nose:  External nasal examination shows no deformity or inflammation. Nasal mucosa are pink and moist without lesions, exudates Oral exam: Lips and gums are healthy appearing.There is no oropharyngeal erythema or exudate. Neck:  No thyromegaly, masses, tenderness noted.    Heart:  Normal rate and regular rhythm. S1 and S2 normal without gallop, murmur, click, rub.  Lungs: Chest clear to auscultation without wheezes, rhonchi, rales, rubs. Abdomen: Bowel sounds are normal.  Abdomen is soft and nontender with no organomegaly, hernias, masses. GU: Deferred  Extremities:  No cyanosis, clubbing, edema. Neurologic exam:  Strength equal  in upper & lower extremities. Balance, Rhomberg, finger to nose testing could not be completed due to clinical state Deep tendon reflexes are equal Skin: Warm & dry w/o tenting. No significant lesions or rash.  See clinical summary under each active problem in the Problem List with associated updated therapeutic plan

## 2021-12-21 DIAGNOSIS — Z8673 Personal history of transient ischemic attack (TIA), and cerebral infarction without residual deficits: Secondary | ICD-10-CM | POA: Insufficient documentation

## 2021-12-21 DIAGNOSIS — K219 Gastro-esophageal reflux disease without esophagitis: Secondary | ICD-10-CM | POA: Insufficient documentation

## 2021-12-21 DIAGNOSIS — D649 Anemia, unspecified: Secondary | ICD-10-CM | POA: Insufficient documentation

## 2021-12-21 DIAGNOSIS — E6609 Other obesity due to excess calories: Secondary | ICD-10-CM | POA: Insufficient documentation

## 2021-12-25 DIAGNOSIS — Z23 Encounter for immunization: Secondary | ICD-10-CM | POA: Diagnosis not present

## 2021-12-25 DIAGNOSIS — I69351 Hemiplegia and hemiparesis following cerebral infarction affecting right dominant side: Secondary | ICD-10-CM | POA: Diagnosis not present

## 2021-12-25 DIAGNOSIS — I639 Cerebral infarction, unspecified: Secondary | ICD-10-CM | POA: Diagnosis not present

## 2021-12-25 DIAGNOSIS — G8191 Hemiplegia, unspecified affecting right dominant side: Secondary | ICD-10-CM | POA: Diagnosis not present

## 2021-12-26 ENCOUNTER — Non-Acute Institutional Stay (SKILLED_NURSING_FACILITY): Payer: Medicare HMO | Admitting: Adult Health

## 2021-12-26 ENCOUNTER — Encounter: Payer: Self-pay | Admitting: Adult Health

## 2021-12-26 DIAGNOSIS — M79675 Pain in left toe(s): Secondary | ICD-10-CM

## 2021-12-26 NOTE — Progress Notes (Signed)
Location:  [facility] Room Number: 128 Place of Service:  SNF (31)   CODE STATUS: full   No Known Allergies  Chief Complaint  Patient presents with   Acute Visit    Left bunion pain     HPI:  She is having left bunion pain for the past 1 week. She stated that it is difficult to ambulate. She is able to bear weight.   Past Medical History:  Diagnosis Date   Anxiety    Colon cancer ([place])    Hypercholesterolemia    Hypertension    Obesity    Stroke ([place]) 12/05/2009    Past Surgical History:  Procedure Laterality Date   COLONOSCOPY  12/27/08   simple adenoma/inflammatory polyp at anastomosis   COLONOSCOPY  04/23/2011   Procedure: COLONOSCOPY;  Surgeon: Dorothyann Peng, MD;  [facility];  Service: Endoscopy;  Laterality: N/A;  10:00   IR CT HEAD LTD  12/09/2021   IR PERCUTANEOUS ART THROMBECTOMY/INFUSION INTRACRANIAL INC DIAG ANGIO  12/09/2021   RADIOLOGY WITH ANESTHESIA N/A 12/09/2021   Procedure: RADIOLOGY WITH ANESTHESIA;  Surgeon: Radiologist, Medication, MD;  Location: [facility];  Service: Radiology;  Laterality: N/A;   RIGHT COLECTOMY  11/14/2007    Social History   Socioeconomic History   Marital status: Divorced    Spouse name: Not on file   Number of children: Not on file   Years of education: Not on file   Highest education level: Not on file  Occupational History   Not on file  Tobacco Use   Smoking status: Never   Smokeless tobacco: Never  Vaping Use   Vaping Use: Unknown  Substance and Sexual Activity   Alcohol use: Not Currently   Drug use: No   Sexual activity: Yes  Other Topics Concern   Not on file  Social History Narrative   RAISES HER GRANDSON SINCE HE WAS 2 YO. HE IS 6 YO NOW.   Social Determinants of Health   Financial Resource Strain: Not on file  Food Insecurity: Not on file  Transportation Needs: Not on file  Physical Activity: Not on file  Stress: Not on file  Social Connections: Not on file   Intimate Partner Violence: Not on file   Family History  Problem Relation Age of Onset   Heart attack Mother    Stroke Father       VITAL SIGNS BP 130/78   Pulse 80   Temp 97.6 F (36.4 C)   Resp 18   Ht '5\' 3"'$  (1.6 m)   Wt 176 lb 9.6 oz (80.1 kg)   SpO2 98%   BMI 31.28 kg/m   Outpatient Encounter Medications as of 12/26/2021  Medication Sig   amLODipine (NORVASC) 5 MG tablet Take 5 mg by mouth daily.   apixaban (ELIQUIS) 5 MG TABS tablet Take 1 tablet (5 mg total) by mouth 2 (two) times daily.   food thickener (SIMPLYTHICK, NECTAR/LEVEL 2/MILDLY THICK,) GEL Take 1 packet by mouth as needed (for NECTAR thick liquids).   metoprolol tartrate (LOPRESSOR) 25 MG tablet Take 1 tablet (25 mg total) by mouth 2 (two) times daily.   pantoprazole (PROTONIX) 40 MG tablet Take 1 tablet (40 mg total) by mouth at bedtime.   rosuvastatin (CRESTOR) 40 MG tablet Place 1 tablet (40 mg total) into feeding tube daily.   No facility-administered encounter medications on file as of 12/26/2021.     SIGNIFICANT DIAGNOSTIC EXAMS  PREVIOUS   12-09-21:  ct of head:  1. No acute intracranial abnormality or significant interval change. 2. Stable remote infarcts of the right corona radiata, right caudate head, and inferior left cerebellum. 3. Stable atrophy and white matter disease. This likely reflects the sequela of chronic microvascular ischemia. 4. Aspects is 10/10  12-09-21: cta of head and neck:  1. Proximal left M1 occlusion with limited collaterals within the anterior aspect of the left MCA distribution. 2. Moderate stenosis of the right M1 segment. 3. Segmental narrowing within distal right MCA branch vessels without a significant proximal stenosis or occlusion. 4. Moderate stenosis of the proximal right P2 segment. 5. 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 It is occluded again at the C2-3 level. 6. High-grade stenoses of the proximal internal  carotid arteries bilaterally at the carotid bifurcations. 7. Moderate stenosis at the origin of the right vertebral artery.  12-09-21: chest x-ray: There are no signs of pulmonary edema or focal pulmonary consolidation.  12-10-21: 2-d echo:   1. Left ventricular ejection fraction, by estimation, is 60 to 65%. The  left ventricle has normal function. The left ventricle has no regional  wall motion abnormalities. There is moderate left ventricular hypertrophy  of the basal-septal segment. Left  ventricular diastolic parameters are indeterminate.   2. Right ventricular systolic function is normal. The right ventricular  size is normal. There is normal pulmonary artery systolic pressure. The  estimated right ventricular systolic pressure is 44.0 mmHg.   3. The mitral valve is normal in structure. No evidence of mitral valve  regurgitation. No evidence of mitral stenosis.   4. Tricuspid valve regurgitation is moderate.   5. The aortic valve is tricuspid. Aortic valve regurgitation is mild. No  aortic stenosis is present.   6. Pulmonic valve regurgitation is moderate.   7. The inferior vena cava is normal in size with greater than 50%  respiratory variability, suggesting right atrial pressure of 3 mmHg.   12-10-21: MRI of head: 1. Acute/subacute nonhemorrhagic infarcts involving the left lentiform nucleus and caudate. 2. Acute/subacute nonhemorrhagic infarct involving the posterior left insular cortex. 3. The remainder of the left MCA territory is spared. 4. Subarachnoid hemorrhage in the left parietal lobe. 5. Stable atrophy and white matter disease elsewhere likely reflects the sequela of chronic microvascular ischemia.  12-12-21: ct of head:  1. Expected CT appearance of the Left MCA territory infarcts since MRI yesterday. No parenchymal hemorrhagic transformation. No midline shift. 2. Trace subarachnoid hemorrhage in the left hemisphere. 3. No new intracranial abnormality.  12-12-21: swallow  study: Dysphagia 3 (Mech soft) solids;Nectar thick liquid   NO NEW EXAMS   LABS REVIEWED PREVIOUS   12-09-21: wbc 5.2; hgb 12.4; hct 38.2; mcv 81.4 plt 181; glucose 114; bun 12; creat 0.90; k+ 3.2; na++ 134; ca 8.6; gfr >60; protein 7.6 albumin 3.9 hgb a1c 5.7 12-10-21: chol 196; ldl 131; trig 138; hdl 37 12-12-21: wbc 6.1; hgb 10.3; hct 32.5; mcv 83.5 plt 148; glucose 97; bun 8; creat 0.94; k+ 3.6; na++ 137; a 8.5; gfr >60  NO NEW LABS.   Review of Systems  Constitutional:  Negative for malaise/fatigue.  Respiratory:  Negative for cough and shortness of breath.   Cardiovascular:  Negative for chest pain, palpitations and leg swelling.  Gastrointestinal:  Negative for abdominal pain, constipation and heartburn.  Musculoskeletal:  Positive for joint pain. Negative for back pain and myalgias.       Left bunion pain   Skin: Negative.  Neurological:  Negative for dizziness.  Psychiatric/Behavioral:  The patient is not nervous/anxious.     Physical Exam Constitutional:      General: She is not in acute distress.    Appearance: She is well-developed. She is not diaphoretic.  Neck:     Thyroid: No thyromegaly.  Cardiovascular:     Rate and Rhythm: Normal rate and regular rhythm.     Pulses: Normal pulses.     Heart sounds: Normal heart sounds.  Pulmonary:     Effort: Pulmonary effort is normal. No respiratory distress.     Breath sounds: Normal breath sounds.  Abdominal:     General: Bowel sounds are normal. There is no distension.     Palpations: Abdomen is soft.     Tenderness: There is no abdominal tenderness.  Musculoskeletal:        General: Normal range of motion.     Cervical back: Neck supple.     Right lower leg: No edema.     Left lower leg: No edema.     Comments: Left bunion is tender and slightly swollen no redness or warmth present   Lymphadenopathy:     Cervical: No cervical adenopathy.  Skin:    General: Skin is warm and dry.  Neurological:     Mental  Status: She is alert. Mental status is at baseline.  Psychiatric:        Mood and Affect: Mood normal.      ASSESSMENT/ PLAN:   TODAY  Left great toe pain: at bunion; will use voltaren gel 2 gm to left bunion twice daily through 01-09-22.   Ok Edwards NP [facility]  call [phone]

## 2021-12-27 ENCOUNTER — Non-Acute Institutional Stay (SKILLED_NURSING_FACILITY): Payer: Medicare HMO | Admitting: Adult Health

## 2021-12-27 ENCOUNTER — Encounter: Payer: Self-pay | Admitting: Adult Health

## 2021-12-27 ENCOUNTER — Other Ambulatory Visit (HOSPITAL_COMMUNITY)
Admission: RE | Admit: 2021-12-27 | Discharge: 2021-12-27 | Disposition: A | Discharge status: Home / Self Care | Payer: Medicare HMO | Source: Skilled Nursing Facility | Attending: Adult Health | Admitting: Adult Health

## 2021-12-27 DIAGNOSIS — N179 Acute kidney failure, unspecified: Secondary | ICD-10-CM | POA: Diagnosis not present

## 2021-12-27 DIAGNOSIS — E876 Hypokalemia: Secondary | ICD-10-CM | POA: Diagnosis not present

## 2021-12-27 DIAGNOSIS — I10 Essential (primary) hypertension: Secondary | ICD-10-CM

## 2021-12-27 LAB — CBC
HCT: 40.1 % (ref 36.0–46.0)
Hemoglobin: 12.7 g/dL (ref 12.0–15.0)
MCH: 25.5 pg — ABNORMAL LOW (ref 26.0–34.0)
MCHC: 31.7 g/dL (ref 30.0–36.0)
MCV: 80.4 fL (ref 80.0–100.0)
Platelets: 278 10*3/uL (ref 150–400)
RBC: 4.99 MIL/uL (ref 3.87–5.11)
RDW: 14.1 % (ref 11.5–15.5)
WBC: 7.3 10*3/uL (ref 4.0–10.5)
nRBC: 0 % (ref 0.0–0.2)

## 2021-12-27 LAB — COMPREHENSIVE METABOLIC PANEL
ALT: 16 U/L (ref 0–44)
AST: 30 U/L (ref 15–41)
Albumin: 3.4 g/dL — ABNORMAL LOW (ref 3.5–5.0)
Alkaline Phosphatase: 69 U/L (ref 38–126)
Anion gap: 12 (ref 5–15)
BUN: 16 mg/dL (ref 8–23)
CO2: 26 mmol/L (ref 22–32)
Calcium: 8.8 mg/dL — ABNORMAL LOW (ref 8.9–10.3)
Chloride: 95 mmol/L — ABNORMAL LOW (ref 98–111)
Creatinine, Ser: 1.95 mg/dL — ABNORMAL HIGH (ref 0.44–1.00)
GFR, Estimated: 26 mL/min — ABNORMAL LOW (ref >60)
Glucose, Bld: 113 mg/dL — ABNORMAL HIGH (ref 70–99)
Potassium: 2.6 mmol/L — CL (ref 3.5–5.1)
Sodium: 133 mmol/L — ABNORMAL LOW (ref 135–145)
Total Bilirubin: 1.2 mg/dL (ref 0.3–1.2)
Total Protein: 7.2 g/dL (ref 6.5–8.1)

## 2021-12-27 NOTE — Progress Notes (Signed)
Location:  [place] Room Number: 128 Place of Service:  SNF (31)   CODE STATUS: full   No Known Allergies  Chief Complaint  Patient presents with   Acute Visit    Change in status     HPI:  She has had periods of increased lethargy. She does continue to participate in therapy. She denies any pain. No reports of changes in appetite weight is stable no reports of fevers present.   Past Medical History:  Diagnosis Date   Anxiety    Colon cancer ([place])    Hypercholesterolemia    Hypertension    Obesity    Stroke ([place]) 12/05/2009    Past Surgical History:  Procedure Laterality Date   COLONOSCOPY  12/27/08   simple adenoma/inflammatory polyp at anastomosis   COLONOSCOPY  04/23/2011   Procedure: COLONOSCOPY;  Surgeon: Dorothyann Peng, MD;  [facility];  Service: Endoscopy;  Laterality: N/A;  10:00   IR CT HEAD LTD  12/09/2021   IR PERCUTANEOUS ART THROMBECTOMY/INFUSION INTRACRANIAL INC DIAG ANGIO  12/09/2021   RADIOLOGY WITH ANESTHESIA N/A 12/09/2021   Procedure: RADIOLOGY WITH ANESTHESIA;  Surgeon: Radiologist, Medication, MD;  Location: [facility];  Service: Radiology;  Laterality: N/A;   RIGHT COLECTOMY  11/14/2007    Social History   Socioeconomic History   Marital status: Divorced    Spouse name: Not on file   Number of children: Not on file   Years of education: Not on file   Highest education level: Not on file  Occupational History   Not on file  Tobacco Use   Smoking status: Never   Smokeless tobacco: Never  Vaping Use   Vaping Use: Unknown  Substance and Sexual Activity   Alcohol use: Not Currently   Drug use: No   Sexual activity: Yes  Other Topics Concern   Not on file  Social History Narrative   RAISES HER GRANDSON SINCE HE WAS 2 YO. HE IS 6 YO NOW.   Social Determinants of Health   Financial Resource Strain: Not on file  Food Insecurity: Not on file  Transportation Needs: Not on file  Physical Activity: Not on  file  Stress: Not on file  Social Connections: Not on file  Intimate Partner Violence: Not on file   Family History  Problem Relation Age of Onset   Heart attack Mother    Stroke Father       VITAL SIGNS BP 130/78   Pulse 82   Temp 97.6 F (36.4 C)   Resp 18   Ht '5\' 3"'$  (1.6 m)   Wt 176 lb 9.6 oz (80.1 kg)   SpO2 98%   BMI 31.28 kg/m   Outpatient Encounter Medications as of 12/27/2021  Medication Sig   amLODipine (NORVASC) 5 MG tablet Take 5 mg by mouth daily.   apixaban (ELIQUIS) 5 MG TABS tablet Take 1 tablet (5 mg total) by mouth 2 (two) times daily.   food thickener (SIMPLYTHICK, NECTAR/LEVEL 2/MILDLY THICK,) GEL Take 1 packet by mouth as needed (for NECTAR thick liquids).   metoprolol tartrate (LOPRESSOR) 25 MG tablet Take 1 tablet (25 mg total) by mouth 2 (two) times daily.   pantoprazole (PROTONIX) 40 MG tablet Take 1 tablet (40 mg total) by mouth at bedtime.   rosuvastatin (CRESTOR) 40 MG tablet Place 1 tablet (40 mg total) into feeding tube daily.   No facility-administered encounter medications on file as of 12/27/2021.     SIGNIFICANT  DIAGNOSTIC EXAMS  PREVIOUS   12-09-21: ct of head:  1. No acute intracranial abnormality or significant interval change. 2. Stable remote infarcts of the right corona radiata, right caudate head, and inferior left cerebellum. 3. Stable atrophy and white matter disease. This likely reflects the sequela of chronic microvascular ischemia. 4. Aspects is 10/10  12-09-21: cta of head and neck:  1. Proximal left M1 occlusion with limited collaterals within the anterior aspect of the left MCA distribution. 2. Moderate stenosis of the right M1 segment. 3. Segmental narrowing within distal right MCA branch vessels without a significant proximal stenosis or occlusion. 4. Moderate stenosis of the proximal right P2 segment. 5. 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 It is occluded again at the  C2-3 level. 6. High-grade stenoses of the proximal internal carotid arteries bilaterally at the carotid bifurcations. 7. Moderate stenosis at the origin of the right vertebral artery.  12-09-21: chest x-ray: There are no signs of pulmonary edema or focal pulmonary consolidation.  12-10-21: 2-d echo:   1. Left ventricular ejection fraction, by estimation, is 60 to 65%. The  left ventricle has normal function. The left ventricle has no regional  wall motion abnormalities. There is moderate left ventricular hypertrophy  of the basal-septal segment. Left  ventricular diastolic parameters are indeterminate.   2. Right ventricular systolic function is normal. The right ventricular  size is normal. There is normal pulmonary artery systolic pressure. The  estimated right ventricular systolic pressure is 48.5 mmHg.   3. The mitral valve is normal in structure. No evidence of mitral valve  regurgitation. No evidence of mitral stenosis.   4. Tricuspid valve regurgitation is moderate.   5. The aortic valve is tricuspid. Aortic valve regurgitation is mild. No  aortic stenosis is present.   6. Pulmonic valve regurgitation is moderate.   7. The inferior vena cava is normal in size with greater than 50%  respiratory variability, suggesting right atrial pressure of 3 mmHg.   12-10-21: MRI of head: 1. Acute/subacute nonhemorrhagic infarcts involving the left lentiform nucleus and caudate. 2. Acute/subacute nonhemorrhagic infarct involving the posterior left insular cortex. 3. The remainder of the left MCA territory is spared. 4. Subarachnoid hemorrhage in the left parietal lobe. 5. Stable atrophy and white matter disease elsewhere likely reflects the sequela of chronic microvascular ischemia.  12-12-21: ct of head:  1. Expected CT appearance of the Left MCA territory infarcts since MRI yesterday. No parenchymal hemorrhagic transformation. No midline shift. 2. Trace subarachnoid hemorrhage in the left  hemisphere. 3. No new intracranial abnormality.  12-12-21: swallow study: Dysphagia 3 (Mech soft) solids;Nectar thick liquid   NO NEW EXAMS   LABS REVIEWED PREVIOUS   12-09-21: wbc 5.2; hgb 12.4; hct 38.2; mcv 81.4 plt 181; glucose 114; bun 12; creat 0.90; k+ 3.2; na++ 134; ca 8.6; gfr >60; protein 7.6 albumin 3.9 hgb a1c 5.7 12-10-21: chol 196; ldl 131; trig 138; hdl 37 12-12-21: wbc 6.1; hgb 10.3; hct 32.5; mcv 83.5 plt 148; glucose 97; bun 8; creat 0.94; k+ 3.6; na++ 137; a 8.5; gfr >60  TODAY  12-27-21: wbc 7.3; hgb 12.7; hct 40.2; mcv 80.4 plt 278; glucose 113; bun 16; creat 1.95; k+ 2.6; na++ 133; ca 8.8; gfr 26 protein 7.2 albumin 3.4   Review of Systems  Constitutional:  Positive for malaise/fatigue.  Respiratory:  Negative for cough and shortness of breath.   Cardiovascular:  Negative for chest pain, palpitations and leg swelling.  Gastrointestinal:  Negative for abdominal pain, constipation and heartburn.  Musculoskeletal:  Negative for back pain, joint pain and myalgias.  Skin: Negative.   Neurological:  Positive for weakness. Negative for dizziness.  Psychiatric/Behavioral:  The patient is not nervous/anxious.     Physical Exam Constitutional:      General: She is not in acute distress.    Appearance: She is well-developed. She is not diaphoretic.  Neck:     Thyroid: No thyromegaly.  Cardiovascular:     Rate and Rhythm: Normal rate and regular rhythm.     Pulses: Normal pulses.     Heart sounds: Normal heart sounds.  Pulmonary:     Effort: Pulmonary effort is normal. No respiratory distress.     Breath sounds: Normal breath sounds.  Abdominal:     General: Bowel sounds are normal. There is no distension.     Palpations: Abdomen is soft.     Tenderness: There is no abdominal tenderness.  Musculoskeletal:        General: Normal range of motion.     Cervical back: Neck supple.     Right lower leg: No edema.     Left lower leg: No edema.  Lymphadenopathy:      Cervical: No cervical adenopathy.  Skin:    General: Skin is warm and dry.  Neurological:     Mental Status: She is alert. Mental status is at baseline.  Psychiatric:        Mood and Affect: Mood normal.      ASSESSMENT/ PLAN:  TODAY  Essential hypertension Hypokalemia Acute renal failure  unspecified acute renal failure type   Will lower lopressor to 12.5 mg twice daily Will give k+ 60 meq three times today Will give NS at 75 cc per hour for 2 liters.    Ok Edwards NP [facility]  call [phone]

## 2021-12-28 ENCOUNTER — Other Ambulatory Visit (HOSPITAL_COMMUNITY)
Admission: RE | Admit: 2021-12-28 | Discharge: 2021-12-28 | Disposition: A | Discharge status: Home / Self Care | Payer: Medicare HMO | Source: Skilled Nursing Facility | Attending: Adult Health | Admitting: Adult Health

## 2021-12-28 ENCOUNTER — Non-Acute Institutional Stay (SKILLED_NURSING_FACILITY): Payer: Medicare HMO | Admitting: Adult Health

## 2021-12-28 DIAGNOSIS — N19 Unspecified kidney failure: Secondary | ICD-10-CM | POA: Diagnosis not present

## 2021-12-28 DIAGNOSIS — E876 Hypokalemia: Secondary | ICD-10-CM | POA: Diagnosis not present

## 2021-12-28 DIAGNOSIS — N179 Acute kidney failure, unspecified: Secondary | ICD-10-CM

## 2021-12-28 LAB — BASIC METABOLIC PANEL
Anion gap: 10 (ref 5–15)
BUN: 14 mg/dL (ref 8–23)
CO2: 23 mmol/L (ref 22–32)
Calcium: 8.9 mg/dL (ref 8.9–10.3)
Chloride: 107 mmol/L (ref 98–111)
Creatinine, Ser: 1.6 mg/dL — ABNORMAL HIGH (ref 0.44–1.00)
GFR, Estimated: 32 mL/min — ABNORMAL LOW (ref >60)
Glucose, Bld: 101 mg/dL — ABNORMAL HIGH (ref 70–99)
Potassium: 4 mmol/L (ref 3.5–5.1)
Sodium: 140 mmol/L (ref 135–145)

## 2021-12-29 ENCOUNTER — Non-Acute Institutional Stay (SKILLED_NURSING_FACILITY): Payer: Medicare HMO | Admitting: Adult Health

## 2021-12-29 ENCOUNTER — Encounter: Payer: Self-pay | Admitting: Adult Health

## 2021-12-29 DIAGNOSIS — I69391 Dysphagia following cerebral infarction: Secondary | ICD-10-CM

## 2021-12-29 DIAGNOSIS — Z8673 Personal history of transient ischemic attack (TIA), and cerebral infarction without residual deficits: Secondary | ICD-10-CM

## 2021-12-29 DIAGNOSIS — I4891 Unspecified atrial fibrillation: Secondary | ICD-10-CM

## 2021-12-29 NOTE — Progress Notes (Unsigned)
Location:  [place] Room Number: 128 Place of Service:  SNF (31) Provider;  Ok Edwards, NP  CODE STATUS: FULL CODE  No Known Allergies  Chief Complaint  Patient presents with   Acute Visit    Care plan meeting    HPI:  We have come together for her care plan meeting. BIMS 15/15 mood 9/30: not sleeping well; decreased energy; depression. Ambulate short distance with supervision; uses wheelchair moderate assist with transfers; no falls. She requires moderate assist with upper  body; max assist with toileting; lower body. Dietary: her diet has been changed to regular with thin liquids. Supervision with eating; weight is 182 pounds appetite variable; does not like the food. Therapy: stand/pivot: independent; bed mobility independent; ambulate 300 feet; car transfer moderate; upper/lower body moderate; brp: supervision. Activities: little participation. She will continue to be followed for her chronic illnesses including:  New onset atrial fibrillation  History of ischemic stroke  Dysphagia due to recent stroke   Past Medical History:  Diagnosis Date   Anxiety    Colon cancer ([place])    Hypercholesterolemia    Hypertension    Obesity    Stroke ([place]) 12/05/2009    Past Surgical History:  Procedure Laterality Date   COLONOSCOPY  12/27/08   simple adenoma/inflammatory polyp at anastomosis   COLONOSCOPY  04/23/2011   Procedure: COLONOSCOPY;  Surgeon: Dorothyann Peng, MD;  [facility];  Service: Endoscopy;  Laterality: N/A;  10:00   IR CT HEAD LTD  12/09/2021   IR PERCUTANEOUS ART THROMBECTOMY/INFUSION INTRACRANIAL INC DIAG ANGIO  12/09/2021   RADIOLOGY WITH ANESTHESIA N/A 12/09/2021   Procedure: RADIOLOGY WITH ANESTHESIA;  Surgeon: Radiologist, Medication, MD;  Location: [place];  Service: Radiology;  Laterality: N/A;   RIGHT COLECTOMY  11/14/2007    Social History   Socioeconomic History   Marital status: Divorced    Spouse name: Not on file    Number of children: Not on file   Years of education: Not on file   Highest education level: Not on file  Occupational History   Not on file  Tobacco Use   Smoking status: Never   Smokeless tobacco: Never  Vaping Use   Vaping Use: Unknown  Substance and Sexual Activity   Alcohol use: Not Currently   Drug use: No   Sexual activity: Yes  Other Topics Concern   Not on file  Social History Narrative   RAISES HER GRANDSON SINCE HE WAS 2 YO. HE IS 6 YO NOW.   Social Determinants of Health   Financial Resource Strain: Not on file  Food Insecurity: Not on file  Transportation Needs: Not on file  Physical Activity: Not on file  Stress: Not on file  Social Connections: Not on file  Intimate Partner Violence: Not on file   Family History  Problem Relation Age of Onset   Heart attack Mother    Stroke Father       VITAL SIGNS BP 130/78   Pulse 82   Temp 97.6 F (36.4 C)   Resp 20   Ht '5\' 3"'$  (1.6 m)   Wt 176 lb 9.6 oz (80.1 kg)   SpO2 98%   BMI 31.28 kg/m   Outpatient Encounter Medications as of 12/29/2021  Medication Sig   amLODipine (NORVASC) 5 MG tablet Take 5 mg by mouth daily.   apixaban (ELIQUIS) 5 MG TABS tablet Take 1 tablet (5 mg total) by mouth 2 (two) times daily.  diclofenac Sodium (VOLTAREN) 1 % GEL Apply 2 g topically in the morning and at bedtime.   food thickener (SIMPLYTHICK, NECTAR/LEVEL 2/MILDLY THICK,) GEL Take 1 packet by mouth as needed (for NECTAR thick liquids).   metoprolol tartrate (LOPRESSOR) 25 MG tablet Take 1 tablet (25 mg total) by mouth 2 (two) times daily.   pantoprazole (PROTONIX) 40 MG tablet Take 1 tablet (40 mg total) by mouth at bedtime.   rosuvastatin (CRESTOR) 40 MG tablet Place 1 tablet (40 mg total) into feeding tube daily.   No facility-administered encounter medications on file as of 12/29/2021.     SIGNIFICANT DIAGNOSTIC EXAMS  PREVIOUS   12-09-21: ct of head:  1. No acute intracranial abnormality or significant  interval change. 2. Stable remote infarcts of the right corona radiata, right caudate head, and inferior left cerebellum. 3. Stable atrophy and white matter disease. This likely reflects the sequela of chronic microvascular ischemia. 4. Aspects is 10/10  12-09-21: cta of head and neck:  1. Proximal left M1 occlusion with limited collaterals within the anterior aspect of the left MCA distribution. 2. Moderate stenosis of the right M1 segment. 3. Segmental narrowing within distal right MCA branch vessels without a significant proximal stenosis or occlusion. 4. Moderate stenosis of the proximal right P2 segment. 5. 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 It is occluded again at the C2-3 level. 6. High-grade stenoses of the proximal internal carotid arteries bilaterally at the carotid bifurcations. 7. Moderate stenosis at the origin of the right vertebral artery.  12-09-21: chest x-ray: There are no signs of pulmonary edema or focal pulmonary consolidation.  12-10-21: 2-d echo:   1. Left ventricular ejection fraction, by estimation, is 60 to 65%. The  left ventricle has normal function. The left ventricle has no regional  wall motion abnormalities. There is moderate left ventricular hypertrophy  of the basal-septal segment. Left  ventricular diastolic parameters are indeterminate.   2. Right ventricular systolic function is normal. The right ventricular  size is normal. There is normal pulmonary artery systolic pressure. The  estimated right ventricular systolic pressure is 06.2 mmHg.   3. The mitral valve is normal in structure. No evidence of mitral valve  regurgitation. No evidence of mitral stenosis.   4. Tricuspid valve regurgitation is moderate.   5. The aortic valve is tricuspid. Aortic valve regurgitation is mild. No  aortic stenosis is present.   6. Pulmonic valve regurgitation is moderate.   7. The inferior vena cava is normal in size with  greater than 50%  respiratory variability, suggesting right atrial pressure of 3 mmHg.   12-10-21: MRI of head: 1. Acute/subacute nonhemorrhagic infarcts involving the left lentiform nucleus and caudate. 2. Acute/subacute nonhemorrhagic infarct involving the posterior left insular cortex. 3. The remainder of the left MCA territory is spared. 4. Subarachnoid hemorrhage in the left parietal lobe. 5. Stable atrophy and white matter disease elsewhere likely reflects the sequela of chronic microvascular ischemia.  12-12-21: ct of head:  1. Expected CT appearance of the Left MCA territory infarcts since MRI yesterday. No parenchymal hemorrhagic transformation. No midline shift. 2. Trace subarachnoid hemorrhage in the left hemisphere. 3. No new intracranial abnormality.  12-12-21: swallow study: Dysphagia 3 (Mech soft) solids;Nectar thick liquid   NO NEW EXAMS   LABS REVIEWED PREVIOUS   12-09-21: wbc 5.2; hgb 12.4; hct 38.2; mcv 81.4 plt 181; glucose 114; bun 12; creat 0.90; k+ 3.2; na++ 134; ca 8.6; gfr >60; protein  7.6 albumin 3.9 hgb a1c 5.7 12-10-21: chol 196; ldl 131; trig 138; hdl 37 12-12-21: wbc 6.1; hgb 10.3; hct 32.5; mcv 83.5 plt 148; glucose 97; bun 8; creat 0.94; k+ 3.6; na++ 137; a 8.5; gfr >60 12-27-21: wbc 7.3; hgb 12.7; hct 40.2; mcv 80.4 plt 278; glucose 113; bun 16; creat 1.95; k+ 2.6; na++ 133; ca 8.8; gfr 26 protein 7.2 albumin 3.4 12-28-21: glucose 101; bun 14; creat 1.60; k+ 4.0; na++ 140; ca 8.9; gfr 32  NO NEW LABS.   Review of Systems  Constitutional:  Negative for malaise/fatigue.  Respiratory:  Negative for cough and shortness of breath.   Cardiovascular:  Negative for chest pain, palpitations and leg swelling.  Gastrointestinal:  Negative for abdominal pain, constipation and heartburn.  Musculoskeletal:  Negative for back pain, joint pain and myalgias.  Skin: Negative.   Neurological:  Negative for dizziness.  Psychiatric/Behavioral:  The patient is not  nervous/anxious.     Physical Exam Constitutional:      General: She is not in acute distress.    Appearance: She is well-developed. She is not diaphoretic.  Neck:     Thyroid: No thyromegaly.  Cardiovascular:     Rate and Rhythm: Normal rate and regular rhythm.     Pulses: Normal pulses.     Heart sounds: Normal heart sounds.  Pulmonary:     Effort: Pulmonary effort is normal. No respiratory distress.     Breath sounds: Normal breath sounds.  Abdominal:     General: Bowel sounds are normal. There is no distension.     Palpations: Abdomen is soft.     Tenderness: There is no abdominal tenderness.  Musculoskeletal:        General: Normal range of motion.     Cervical back: Neck supple.     Right lower leg: No edema.     Left lower leg: No edema.  Lymphadenopathy:     Cervical: No cervical adenopathy.  Skin:    General: Skin is warm and dry.  Neurological:     Mental Status: She is alert. Mental status is at baseline.  Psychiatric:        Mood and Affect: Mood normal.       ASSESSMENT/ PLAN:  TODAY  New onset atrial fibrillation History of ischemic stroke Dysphagia due to recent stroke   Will continue current medications Will continue current plan of care Will continue to monitor her status.   Time spent with patient: 40 minutes: medications; therapy and dietary   Ok Edwards NP [facility]   call [phone]

## 2021-12-29 NOTE — Progress Notes (Unsigned)
Location:  [place] Room Number: 128 Place of Service:  SNF (31)   CODE STATUS: full   No Known Allergies  Chief Complaint  Patient presents with   Acute Visit    Follow up lab     HPI:  She is feeling better today. She denies any lethargy or weakness today. Her k+ level is 4.0 from 2.6.  her creatinine level is 1.60 from 1.95. she is currently receiving IVF of NS. She will need to continue IVF for 2 more liters.   Past Medical History:  Diagnosis Date   Anxiety    Colon cancer ([place])    Hypercholesterolemia    Hypertension    Obesity    Stroke ([place]) 12/05/2009    Past Surgical History:  Procedure Laterality Date   COLONOSCOPY  12/27/08   simple adenoma/inflammatory polyp at anastomosis   COLONOSCOPY  04/23/2011   Procedure: COLONOSCOPY;  Surgeon: Dorothyann Peng, MD;  [facility];  Service: Endoscopy;  Laterality: N/A;  10:00   IR CT HEAD LTD  12/09/2021   IR PERCUTANEOUS ART THROMBECTOMY/INFUSION INTRACRANIAL INC DIAG ANGIO  12/09/2021   RADIOLOGY WITH ANESTHESIA N/A 12/09/2021   Procedure: RADIOLOGY WITH ANESTHESIA;  Surgeon: Radiologist, Medication, MD;  Location: [place];  Service: Radiology;  Laterality: N/A;   RIGHT COLECTOMY  11/14/2007    Social History   Socioeconomic History   Marital status: Divorced    Spouse name: Not on file   Number of children: Not on file   Years of education: Not on file   Highest education level: Not on file  Occupational History   Not on file  Tobacco Use   Smoking status: Never   Smokeless tobacco: Never  Vaping Use   Vaping Use: Unknown  Substance and Sexual Activity   Alcohol use: Not Currently   Drug use: No   Sexual activity: Yes  Other Topics Concern   Not on file  Social History Narrative   RAISES HER GRANDSON SINCE HE WAS 2 YO. HE IS 6 YO NOW.   Social Determinants of Health   Financial Resource Strain: Not on file  Food Insecurity: Not on file  Transportation Needs: Not  on file  Physical Activity: Not on file  Stress: Not on file  Social Connections: Not on file  Intimate Partner Violence: Not on file   Family History  Problem Relation Age of Onset   Heart attack Mother    Stroke Father       VITAL SIGNS BP 135/80   Pulse 88   Temp 97.7 F (36.5 C)   Ht '5\' 3"'$  (1.6 m)   Wt 176 lb 9.6 oz (80.1 kg)   BMI 31.28 kg/m   Outpatient Encounter Medications as of 12/28/2021  Medication Sig   amLODipine (NORVASC) 5 MG tablet Take 5 mg by mouth daily.   apixaban (ELIQUIS) 5 MG TABS tablet Take 1 tablet (5 mg total) by mouth 2 (two) times daily.   food thickener (SIMPLYTHICK, NECTAR/LEVEL 2/MILDLY THICK,) GEL Take 1 packet by mouth as needed (for NECTAR thick liquids).   metoprolol tartrate (LOPRESSOR) 25 MG tablet Take 1 tablet (12.5 mg total) by mouth 2 (two) times daily.   pantoprazole (PROTONIX) 40 MG tablet Take 1 tablet (40 mg total) by mouth at bedtime.   rosuvastatin (CRESTOR) 40 MG tablet Place 1 tablet (40 mg total) into feeding tube daily.   No facility-administered encounter medications on file as of 12/28/2021.  SIGNIFICANT DIAGNOSTIC EXAMS  PREVIOUS   12-09-21: ct of head:  1. No acute intracranial abnormality or significant interval change. 2. Stable remote infarcts of the right corona radiata, right caudate head, and inferior left cerebellum. 3. Stable atrophy and white matter disease. This likely reflects the sequela of chronic microvascular ischemia. 4. Aspects is 10/10  12-09-21: cta of head and neck:  1. Proximal left M1 occlusion with limited collaterals within the anterior aspect of the left MCA distribution. 2. Moderate stenosis of the right M1 segment. 3. Segmental narrowing within distal right MCA branch vessels without a significant proximal stenosis or occlusion. 4. Moderate stenosis of the proximal right P2 segment. 5. 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 It is  occluded again at the C2-3 level. 6. High-grade stenoses of the proximal internal carotid arteries bilaterally at the carotid bifurcations. 7. Moderate stenosis at the origin of the right vertebral artery.  12-09-21: chest x-ray: There are no signs of pulmonary edema or focal pulmonary consolidation.  12-10-21: 2-d echo:   1. Left ventricular ejection fraction, by estimation, is 60 to 65%. The  left ventricle has normal function. The left ventricle has no regional  wall motion abnormalities. There is moderate left ventricular hypertrophy  of the basal-septal segment. Left  ventricular diastolic parameters are indeterminate.   2. Right ventricular systolic function is normal. The right ventricular  size is normal. There is normal pulmonary artery systolic pressure. The  estimated right ventricular systolic pressure is 61.4 mmHg.   3. The mitral valve is normal in structure. No evidence of mitral valve  regurgitation. No evidence of mitral stenosis.   4. Tricuspid valve regurgitation is moderate.   5. The aortic valve is tricuspid. Aortic valve regurgitation is mild. No  aortic stenosis is present.   6. Pulmonic valve regurgitation is moderate.   7. The inferior vena cava is normal in size with greater than 50%  respiratory variability, suggesting right atrial pressure of 3 mmHg.   12-10-21: MRI of head: 1. Acute/subacute nonhemorrhagic infarcts involving the left lentiform nucleus and caudate. 2. Acute/subacute nonhemorrhagic infarct involving the posterior left insular cortex. 3. The remainder of the left MCA territory is spared. 4. Subarachnoid hemorrhage in the left parietal lobe. 5. Stable atrophy and white matter disease elsewhere likely reflects the sequela of chronic microvascular ischemia.  12-12-21: ct of head:  1. Expected CT appearance of the Left MCA territory infarcts since MRI yesterday. No parenchymal hemorrhagic transformation. No midline shift. 2. Trace subarachnoid hemorrhage  in the left hemisphere. 3. No new intracranial abnormality.  12-12-21: swallow study: Dysphagia 3 (Mech soft) solids;Nectar thick liquid   NO NEW EXAMS   LABS REVIEWED PREVIOUS   12-09-21: wbc 5.2; hgb 12.4; hct 38.2; mcv 81.4 plt 181; glucose 114; bun 12; creat 0.90; k+ 3.2; na++ 134; ca 8.6; gfr >60; protein 7.6 albumin 3.9 hgb a1c 5.7 12-10-21: chol 196; ldl 131; trig 138; hdl 37 12-12-21: wbc 6.1; hgb 10.3; hct 32.5; mcv 83.5 plt 148; glucose 97; bun 8; creat 0.94; k+ 3.6; na++ 137; a 8.5; gfr >60  TODAY  12-27-21: wbc 7.3; hgb 12.7; hct 40.2; mcv 80.4 plt 278; glucose 113; bun 16; creat 1.95; k+ 2.6; na++ 133; ca 8.8; gfr 26 protein 7.2 albumin 3.4 12-28-21: glucose 101; bun 14; creat 1.60; k+ 4.0; na++ 140; ca 8.9; gfr 32  Review of Systems  Constitutional:  Negative for malaise/fatigue.       Feels better  Respiratory:  Negative for cough and shortness of breath.   Cardiovascular:  Negative for chest pain, palpitations and leg swelling.  Gastrointestinal:  Negative for abdominal pain, constipation and heartburn.  Musculoskeletal:  Negative for back pain, joint pain and myalgias.  Skin: Negative.   Neurological:  Negative for dizziness.  Psychiatric/Behavioral:  The patient is not nervous/anxious.     Physical Exam Constitutional:      General: She is not in acute distress.    Appearance: She is well-developed. She is not diaphoretic.  Neck:     Thyroid: No thyromegaly.  Cardiovascular:     Rate and Rhythm: Normal rate and regular rhythm.     Pulses: Normal pulses.     Heart sounds: Normal heart sounds.  Pulmonary:     Effort: Pulmonary effort is normal. No respiratory distress.     Breath sounds: Normal breath sounds.  Abdominal:     General: Bowel sounds are normal. There is no distension.     Palpations: Abdomen is soft.     Tenderness: There is no abdominal tenderness.  Musculoskeletal:        General: Normal range of motion.     Cervical back: Neck supple.      Right lower leg: No edema.     Left lower leg: No edema.  Lymphadenopathy:     Cervical: No cervical adenopathy.  Skin:    General: Skin is warm and dry.  Neurological:     Mental Status: She is alert. Mental status is at baseline.  Psychiatric:        Mood and Affect: Mood normal.       ASSESSMENT/ PLAN:  TODAY  Acute renal failure unspecified renal failure type Hypokalemia  Will give another 2 liters of NS at 75 cc per hour Her labs are slowly improving.    Ok Edwards NP [facility]   call [phone]

## 2022-01-01 ENCOUNTER — Non-Acute Institutional Stay (SKILLED_NURSING_FACILITY): Payer: Medicare HMO | Admitting: Adult Health

## 2022-01-01 ENCOUNTER — Other Ambulatory Visit (HOSPITAL_COMMUNITY)
Admission: RE | Admit: 2022-01-01 | Discharge: 2022-01-01 | Disposition: A | Discharge status: Home / Self Care | Payer: Medicare HMO | Source: Skilled Nursing Facility | Attending: Adult Health | Admitting: Adult Health

## 2022-01-01 DIAGNOSIS — E876 Hypokalemia: Secondary | ICD-10-CM | POA: Diagnosis not present

## 2022-01-01 DIAGNOSIS — N179 Acute kidney failure, unspecified: Secondary | ICD-10-CM | POA: Insufficient documentation

## 2022-01-01 DIAGNOSIS — I10 Essential (primary) hypertension: Secondary | ICD-10-CM | POA: Diagnosis not present

## 2022-01-01 LAB — BASIC METABOLIC PANEL
Anion gap: 11 (ref 5–15)
BUN: 10 mg/dL (ref 8–23)
CO2: 24 mmol/L (ref 22–32)
Calcium: 8.9 mg/dL (ref 8.9–10.3)
Chloride: 102 mmol/L (ref 98–111)
Creatinine, Ser: 1.31 mg/dL — ABNORMAL HIGH (ref 0.44–1.00)
GFR, Estimated: 41 mL/min — ABNORMAL LOW (ref >60)
Glucose, Bld: 100 mg/dL — ABNORMAL HIGH (ref 70–99)
Potassium: 2.2 mmol/L — CL (ref 3.5–5.1)
Sodium: 137 mmol/L (ref 135–145)

## 2022-01-01 LAB — POTASSIUM: Potassium: 3.4 mmol/L — ABNORMAL LOW (ref 3.5–5.1)

## 2022-01-02 ENCOUNTER — Other Ambulatory Visit (HOSPITAL_COMMUNITY)
Admission: RE | Admit: 2022-01-02 | Discharge: 2022-01-02 | Disposition: A | Discharge status: Home / Self Care | Payer: Medicare HMO | Source: Skilled Nursing Facility | Attending: Adult Health | Admitting: Adult Health

## 2022-01-02 ENCOUNTER — Encounter: Payer: Self-pay | Admitting: Adult Health

## 2022-01-02 DIAGNOSIS — I69351 Hemiplegia and hemiparesis following cerebral infarction affecting right dominant side: Secondary | ICD-10-CM | POA: Diagnosis not present

## 2022-01-02 LAB — BASIC METABOLIC PANEL
Anion gap: 10 (ref 5–15)
BUN: 10 mg/dL (ref 8–23)
CO2: 20 mmol/L — ABNORMAL LOW (ref 22–32)
Calcium: 9.4 mg/dL (ref 8.9–10.3)
Chloride: 106 mmol/L (ref 98–111)
Creatinine, Ser: 1.39 mg/dL — ABNORMAL HIGH (ref 0.44–1.00)
GFR, Estimated: 38 mL/min — ABNORMAL LOW (ref >60)
Glucose, Bld: 122 mg/dL — ABNORMAL HIGH (ref 70–99)
Potassium: 4.2 mmol/L (ref 3.5–5.1)
Sodium: 136 mmol/L (ref 135–145)

## 2022-01-02 NOTE — Progress Notes (Unsigned)
Location:  [facility] Room Number: 128 Place of Service:  SNF (31)   CODE STATUS: full   No Known Allergies  Chief Complaint  Patient presents with   Acute Visit    Follow up labs     HPI:  She had been receiving IVF for her acute renal failure. Her renal function is improving; she will need to drink more fluids. Her k+ is critically low at 2.2. she tells me that she is feeling good and has no complaints of pain; nausea or vomiting.   Past Medical History:  Diagnosis Date   Anxiety    Colon cancer ([place])    Hypercholesterolemia    Hypertension    Obesity    Stroke ([place]) 12/05/2009    Past Surgical History:  Procedure Laterality Date   COLONOSCOPY  12/27/08   simple adenoma/inflammatory polyp at anastomosis   COLONOSCOPY  04/23/2011   Procedure: COLONOSCOPY;  Surgeon: Dorothyann Peng, MD;  [facility];  Service: Endoscopy;  Laterality: N/A;  10:00   IR CT HEAD LTD  12/09/2021   IR PERCUTANEOUS ART THROMBECTOMY/INFUSION INTRACRANIAL INC DIAG ANGIO  12/09/2021   RADIOLOGY WITH ANESTHESIA N/A 12/09/2021   Procedure: RADIOLOGY WITH ANESTHESIA;  Surgeon: Radiologist, Medication, MD;  Location: [place];  Service: Radiology;  Laterality: N/A;   RIGHT COLECTOMY  11/14/2007    Social History   Socioeconomic History   Marital status: Divorced    Spouse name: Not on file   Number of children: Not on file   Years of education: Not on file   Highest education level: Not on file  Occupational History   Not on file  Tobacco Use   Smoking status: Never   Smokeless tobacco: Never  Vaping Use   Vaping Use: Unknown  Substance and Sexual Activity   Alcohol use: Not Currently   Drug use: No   Sexual activity: Yes  Other Topics Concern   Not on file  Social History Narrative   RAISES HER GRANDSON SINCE HE WAS 2 YO. HE IS 6 YO NOW.   Social Determinants of Health   Financial Resource Strain: Not on file  Food Insecurity: Not on file   Transportation Needs: Not on file  Physical Activity: Not on file  Stress: Not on file  Social Connections: Not on file  Intimate Partner Violence: Not on file   Family History  Problem Relation Age of Onset   Heart attack Mother    Stroke Father       VITAL SIGNS BP 121/88   Pulse (!) 54   Temp 98.6 F (37 C)   Resp 20   Ht '5\' 3"'$  (1.6 m)   Wt 171 lb 9.6 oz (77.8 kg)   SpO2 98%   BMI 30.40 kg/m   Outpatient Encounter Medications as of 01/01/2022  Medication Sig   amLODipine (NORVASC) 5 MG tablet Take 5 mg by mouth daily.   apixaban (ELIQUIS) 5 MG TABS tablet Take 1 tablet (5 mg total) by mouth 2 (two) times daily.   diclofenac Sodium (VOLTAREN) 1 % GEL Apply 2 g topically in the morning and at bedtime.   food thickener (SIMPLYTHICK, NECTAR/LEVEL 2/MILDLY THICK,) GEL Take 1 packet by mouth as needed (for NECTAR thick liquids).   metoprolol tartrate (LOPRESSOR) 25 MG tablet Take 1 tablet (25 mg total) by mouth 2 (two) times daily. (Patient taking differently: Take 12.5 mg by mouth 2 (two) times daily.)   pantoprazole (PROTONIX) 40  MG tablet Take 1 tablet (40 mg total) by mouth at bedtime.   rosuvastatin (CRESTOR) 40 MG tablet Place 1 tablet (40 mg total) into feeding tube daily.   No facility-administered encounter medications on file as of 01/01/2022.     SIGNIFICANT DIAGNOSTIC EXAMS  PREVIOUS   12-09-21: ct of head:  1. No acute intracranial abnormality or significant interval change. 2. Stable remote infarcts of the right corona radiata, right caudate head, and inferior left cerebellum. 3. Stable atrophy and white matter disease. This likely reflects the sequela of chronic microvascular ischemia. 4. Aspects is 10/10  12-09-21: cta of head and neck:  1. Proximal left M1 occlusion with limited collaterals within the anterior aspect of the left MCA distribution. 2. Moderate stenosis of the right M1 segment. 3. Segmental narrowing within distal right MCA branch  vessels without a significant proximal stenosis or occlusion. 4. Moderate stenosis of the proximal right P2 segment. 5. 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 It is occluded again at the C2-3 level. 6. High-grade stenoses of the proximal internal carotid arteries bilaterally at the carotid bifurcations. 7. Moderate stenosis at the origin of the right vertebral artery.  12-09-21: chest x-ray: There are no signs of pulmonary edema or focal pulmonary consolidation.  12-10-21: 2-d echo:   1. Left ventricular ejection fraction, by estimation, is 60 to 65%. The  left ventricle has normal function. The left ventricle has no regional  wall motion abnormalities. There is moderate left ventricular hypertrophy  of the basal-septal segment. Left  ventricular diastolic parameters are indeterminate.   2. Right ventricular systolic function is normal. The right ventricular  size is normal. There is normal pulmonary artery systolic pressure. The  estimated right ventricular systolic pressure is 23.5 mmHg.   3. The mitral valve is normal in structure. No evidence of mitral valve  regurgitation. No evidence of mitral stenosis.   4. Tricuspid valve regurgitation is moderate.   5. The aortic valve is tricuspid. Aortic valve regurgitation is mild. No  aortic stenosis is present.   6. Pulmonic valve regurgitation is moderate.   7. The inferior vena cava is normal in size with greater than 50%  respiratory variability, suggesting right atrial pressure of 3 mmHg.   12-10-21: MRI of head: 1. Acute/subacute nonhemorrhagic infarcts involving the left lentiform nucleus and caudate. 2. Acute/subacute nonhemorrhagic infarct involving the posterior left insular cortex. 3. The remainder of the left MCA territory is spared. 4. Subarachnoid hemorrhage in the left parietal lobe. 5. Stable atrophy and white matter disease elsewhere likely reflects the sequela of chronic microvascular  ischemia.  12-12-21: ct of head:  1. Expected CT appearance of the Left MCA territory infarcts since MRI yesterday. No parenchymal hemorrhagic transformation. No midline shift. 2. Trace subarachnoid hemorrhage in the left hemisphere. 3. No new intracranial abnormality.  12-12-21: swallow study: Dysphagia 3 (Mech soft) solids;Nectar thick liquid   NO NEW EXAMS   LABS REVIEWED PREVIOUS   12-09-21: wbc 5.2; hgb 12.4; hct 38.2; mcv 81.4 plt 181; glucose 114; bun 12; creat 0.90; k+ 3.2; na++ 134; ca 8.6; gfr >60; protein 7.6 albumin 3.9 hgb a1c 5.7 12-10-21: chol 196; ldl 131; trig 138; hdl 37 12-12-21: wbc 6.1; hgb 10.3; hct 32.5; mcv 83.5 plt 148; glucose 97; bun 8; creat 0.94; k+ 3.6; na++ 137; a 8.5; gfr >60 12-27-21: wbc 7.3; hgb 12.7; hct 40.2; mcv 80.4 plt 278; glucose 113; bun 16; creat 1.95; k+ 2.6; na++ 133;  ca 8.8; gfr 26 protein 7.2 albumin 3.4 12-28-21: glucose 101; bun 14; creat 1.60; k+ 4.0; na++ 140; ca 8.9; gfr 32  TODAY  01-01-22: glucose 100; bun 10; creat 1.32; k+ 2.2; na++ 137; ca 8.9; gfr 41   Review of Systems  Constitutional:  Negative for malaise/fatigue.  Respiratory:  Negative for cough and shortness of breath.   Cardiovascular:  Negative for chest pain, palpitations and leg swelling.  Gastrointestinal:  Negative for abdominal pain, constipation and heartburn.  Musculoskeletal:  Negative for back pain, joint pain and myalgias.  Skin: Negative.   Neurological:  Negative for dizziness.  Psychiatric/Behavioral:  The patient is not nervous/anxious.     Physical Exam Constitutional:      General: She is not in acute distress.    Appearance: She is well-developed. She is not diaphoretic.  Neck:     Thyroid: No thyromegaly.  Cardiovascular:     Rate and Rhythm: Normal rate and regular rhythm.     Pulses: Normal pulses.     Heart sounds: Normal heart sounds.  Pulmonary:     Effort: Pulmonary effort is normal. No respiratory distress.     Breath sounds: Normal  breath sounds.  Abdominal:     General: Bowel sounds are normal. There is no distension.     Palpations: Abdomen is soft.     Tenderness: There is no abdominal tenderness.  Musculoskeletal:        General: Normal range of motion.     Cervical back: Neck supple.     Right lower leg: No edema.     Left lower leg: No edema.  Lymphadenopathy:     Cervical: No cervical adenopathy.  Skin:    General: Skin is warm and dry.  Neurological:     Mental Status: She is alert. Mental status is at baseline.  Psychiatric:        Mood and Affect: Mood normal.       ASSESSMENT/ PLAN:  TODAY  Acute renal failure unspecified renal failure type Hypokalemia:   Has completed IVF: will give k+ 60 meq up to five times today; will repeat lab at 4pm and will make further adjustments at that time. Will repeat bmp in the AM     Ok Edwards NP [facility] Adult Medicine   call [phone]

## 2022-01-03 ENCOUNTER — Other Ambulatory Visit: Payer: Self-pay | Admitting: Adult Health

## 2022-01-03 ENCOUNTER — Non-Acute Institutional Stay (SKILLED_NURSING_FACILITY): Payer: Medicare HMO | Admitting: Adult Health

## 2022-01-03 ENCOUNTER — Encounter: Payer: Self-pay | Admitting: Adult Health

## 2022-01-03 DIAGNOSIS — N179 Acute kidney failure, unspecified: Secondary | ICD-10-CM

## 2022-01-03 DIAGNOSIS — K219 Gastro-esophageal reflux disease without esophagitis: Secondary | ICD-10-CM | POA: Diagnosis not present

## 2022-01-03 DIAGNOSIS — I4891 Unspecified atrial fibrillation: Secondary | ICD-10-CM | POA: Diagnosis not present

## 2022-01-03 MED ORDER — AMLODIPINE BESYLATE 5 MG PO TABS: 5.0000 mg | ORAL_TABLET | Freq: Every day | ORAL | 0 refills | Status: DC

## 2022-01-03 MED ORDER — POTASSIUM CHLORIDE CRYS ER 20 MEQ PO TBCR: 40.0000 meq | EXTENDED_RELEASE_TABLET | Freq: Every day | ORAL | 0 refills | Status: AC

## 2022-01-03 MED ORDER — PANTOPRAZOLE SODIUM 40 MG PO TBEC: 40.0000 mg | DELAYED_RELEASE_TABLET | Freq: Every day | ORAL | 0 refills | Status: DC

## 2022-01-03 MED ORDER — METOPROLOL TARTRATE 25 MG PO TABS: 12.5000 mg | ORAL_TABLET | Freq: Two times a day (BID) | ORAL | 0 refills | Status: DC

## 2022-01-03 MED ORDER — APIXABAN 5 MG PO TABS: 5.0000 mg | ORAL_TABLET | Freq: Two times a day (BID) | ORAL | 0 refills | Status: DC

## 2022-01-03 MED ORDER — DICLOFENAC SODIUM 1 % EX GEL: 2.0000 g | Freq: Two times a day (BID) | CUTANEOUS | 0 refills | Status: DC

## 2022-01-03 MED ORDER — ROSUVASTATIN CALCIUM 40 MG PO TABS: 40.0000 mg | ORAL_TABLET | Freq: Every day | ORAL | 0 refills | Status: DC

## 2022-01-03 NOTE — Progress Notes (Signed)
Location:  [facility] Room Number: 128 Place of Service:  SNF (31)  Provider: Ok Edwards np   PCP: [facility] Patient Care Team: [facility], [phone] [facility] as [facility]CP - General (Family Medicine) Danie Binder, MD (Inactive) (Gastroenterology)  Extended Emergency Contact Information Primary Emergency Contact: [facility] Address: [address]          [place], [place] [address] Johnnette Litter of [place] Phone: [phone] Relation: Daughter Secondary Emergency Contact: Suzy Bouchard Address: [address]          [place], [place] [address] Johnnette Litter of [place] Phone: [phone] Mobile Phone: [phone] Relation: Son  Code Status: full  Goals of care:  Advanced Directive information    12/29/2021   12:42 PM  Advanced Directives  Does Patient Have a Medical Advance Directive? No     No Known Allergies  Chief Complaint  Patient presents with   Discharge Note    Discharge note    HPI:  81 y.o. female  being discharged to home with home for pt/ot/rn. She will need a rolling walker; will need to have her prescriptions to be written and will need to follow up with her medical provider. She had been hospitalized for a cva. She was admitted to this facility for short term therapy. She has participated in rehab to improve upon her ability to be independent with her adl care. She is ready to return back home.      Past Medical History:  Diagnosis Date   Anxiety    Colon cancer ([place])    Hypercholesterolemia    Hypertension    Obesity    Stroke ([place]) 12/05/2009    Past Surgical History:  Procedure Laterality Date   COLONOSCOPY  12/27/08   simple adenoma/inflammatory polyp at anastomosis   COLONOSCOPY  04/23/2011   Procedure: COLONOSCOPY;  Surgeon: Dorothyann Peng, MD;  [facility];  Service: Endoscopy;  Laterality: N/A;  10:00   IR CT HEAD LTD  12/09/2021   IR PERCUTANEOUS ART  THROMBECTOMY/INFUSION INTRACRANIAL INC DIAG ANGIO  12/09/2021   RADIOLOGY WITH ANESTHESIA N/A 12/09/2021   Procedure: RADIOLOGY WITH ANESTHESIA;  Surgeon: Radiologist, Medication, MD;  Location: [facility];  Service: Radiology;  Laterality: N/A;   RIGHT COLECTOMY  11/14/2007      reports that she has never smoked. She has never used smokeless tobacco. She reports that she does not currently use alcohol. She reports that she does not use drugs. Social History   Socioeconomic History   Marital status: Divorced    Spouse name: Not on file   Number of children: Not on file   Years of education: Not on file   Highest education level: Not on file  Occupational History   Not on file  Tobacco Use   Smoking status: Never   Smokeless tobacco: Never  Vaping Use   Vaping Use: Unknown  Substance and Sexual Activity   Alcohol use: Not Currently   Drug use: No   Sexual activity: Yes  Other Topics Concern   Not on file  Social History Narrative   RAISES HER GRANDSON SINCE HE WAS 2 YO. HE IS 6 YO NOW.   Social Determinants of Health   Financial Resource Strain: Not on file  Food Insecurity: Not on file  Transportation Needs: Not on file  Physical Activity: Not on file  Stress: Not on file  Social Connections: Not on file  Intimate Partner Violence: Not on file  No Known Allergies  Pertinent  Health Maintenance Due  Topic Date Due   INFLUENZA VACCINE  Completed   DEXA SCAN  Completed    Medications: Outpatient Encounter Medications as of 01/03/2022  Medication Sig   amLODipine (NORVASC) 5 MG tablet Take 1 tablet (5 mg total) by mouth daily.   apixaban (ELIQUIS) 5 MG TABS tablet Take 1 tablet (5 mg total) by mouth 2 (two) times daily.   diclofenac Sodium (VOLTAREN) 1 % GEL Apply 2 g topically in the morning and at bedtime.   metoprolol tartrate (LOPRESSOR) 25 MG tablet Take 0.5 tablets (12.5 mg total) by mouth 2 (two) times daily.   pantoprazole (PROTONIX) 40 MG tablet Take 1 tablet  (40 mg total) by mouth at bedtime.   potassium chloride SA (KLOR-CON M) 20 MEQ tablet Take 2 tablets (40 mEq total) by mouth daily.   rosuvastatin (CRESTOR) 40 MG tablet Place 1 tablet (40 mg total) into feeding tube daily. (Patient taking differently: Take 40 mg by mouth daily.)   No facility-administered encounter medications on file as of 01/03/2022.     Vitals:   01/03/22 1545  BP: 121/88  Pulse: (!) 53  Resp: 20  Temp: 98.6 F (37 C)  SpO2: 98%  Weight: 171 lb 3.2 oz (77.7 kg)  Height: '5\' 3"'$  (1.6 m)   Body mass index is 30.33 kg/m.  SIGNIFICANT DIAGNOSTIC EXAMS  PREVIOUS   12-09-21: ct of head:  1. No acute intracranial abnormality or significant interval change. 2. Stable remote infarcts of the right corona radiata, right caudate head, and inferior left cerebellum. 3. Stable atrophy and white matter disease. This likely reflects the sequela of chronic microvascular ischemia. 4. Aspects is 10/10  12-09-21: cta of head and neck:  1. Proximal left M1 occlusion with limited collaterals within the anterior aspect of the left MCA distribution. 2. Moderate stenosis of the right M1 segment. 3. Segmental narrowing within distal right MCA branch vessels without a significant proximal stenosis or occlusion. 4. Moderate stenosis of the proximal right P2 segment. 5. 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 It is occluded again at the C2-3 level. 6. High-grade stenoses of the proximal internal carotid arteries bilaterally at the carotid bifurcations. 7. Moderate stenosis at the origin of the right vertebral artery.  12-09-21: chest x-ray: There are no signs of pulmonary edema or focal pulmonary consolidation.  12-10-21: 2-d echo:   1. Left ventricular ejection fraction, by estimation, is 60 to 65%. The  left ventricle has normal function. The left ventricle has no regional  wall motion abnormalities. There is moderate left ventricular  hypertrophy  of the basal-septal segment. Left  ventricular diastolic parameters are indeterminate.   2. Right ventricular systolic function is normal. The right ventricular  size is normal. There is normal pulmonary artery systolic pressure. The  estimated right ventricular systolic pressure is 28.3 mmHg.   3. The mitral valve is normal in structure. No evidence of mitral valve  regurgitation. No evidence of mitral stenosis.   4. Tricuspid valve regurgitation is moderate.   5. The aortic valve is tricuspid. Aortic valve regurgitation is mild. No  aortic stenosis is present.   6. Pulmonic valve regurgitation is moderate.   7. The inferior vena cava is normal in size with greater than 50%  respiratory variability, suggesting right atrial pressure of 3 mmHg.   12-10-21: MRI of head: 1. Acute/subacute nonhemorrhagic infarcts involving the left lentiform nucleus and caudate. 2.  Acute/subacute nonhemorrhagic infarct involving the posterior left insular cortex. 3. The remainder of the left MCA territory is spared. 4. Subarachnoid hemorrhage in the left parietal lobe. 5. Stable atrophy and white matter disease elsewhere likely reflects the sequela of chronic microvascular ischemia.  12-12-21: ct of head:  1. Expected CT appearance of the Left MCA territory infarcts since MRI yesterday. No parenchymal hemorrhagic transformation. No midline shift. 2. Trace subarachnoid hemorrhage in the left hemisphere. 3. No new intracranial abnormality.  12-12-21: swallow study: Dysphagia 3 (Mech soft) solids;Nectar thick liquid   NO NEW EXAMS   LABS REVIEWED PREVIOUS   12-09-21: wbc 5.2; hgb 12.4; hct 38.2; mcv 81.4 plt 181; glucose 114; bun 12; creat 0.90; k+ 3.2; na++ 134; ca 8.6; gfr >60; protein 7.6 albumin 3.9 hgb a1c 5.7 12-10-21: chol 196; ldl 131; trig 138; hdl 37 12-12-21: wbc 6.1; hgb 10.3; hct 32.5; mcv 83.5 plt 148; glucose 97; bun 8; creat 0.94; k+ 3.6; na++ 137; a 8.5; gfr >60 12-27-21: wbc 7.3;  hgb 12.7; hct 40.2; mcv 80.4 plt 278; glucose 113; bun 16; creat 1.95; k+ 2.6; na++ 133; ca 8.8; gfr 26 protein 7.2 albumin 3.4 12-28-21: glucose 101; bun 14; creat 1.60; k+ 4.0; na++ 140; ca 8.9; gfr 32  TODAY  01-01-22: glucose 100; bun 10; creat 1.32; k+ 2.2; na++ 137; ca 8.9; gfr 41  01-02-22: glucose 122; bun 10; creat 1.39; k+ 4.2; na++ 136; ca 9.4; gfr 38   Review of Systems  Constitutional:  Negative for malaise/fatigue.  Respiratory:  Negative for cough and shortness of breath.   Cardiovascular:  Negative for chest pain, palpitations and leg swelling.  Gastrointestinal:  Negative for abdominal pain, constipation and heartburn.  Musculoskeletal:  Negative for back pain, joint pain and myalgias.  Skin: Negative.   Neurological:  Negative for dizziness.  Psychiatric/Behavioral:  The patient is not nervous/anxious.    Physical Exam Constitutional:      General: She is not in acute distress.    Appearance: She is well-developed. She is not diaphoretic.  Neck:     Thyroid: No thyromegaly.  Cardiovascular:     Rate and Rhythm: Normal rate and regular rhythm.     Pulses: Normal pulses.     Heart sounds: Normal heart sounds.  Pulmonary:     Effort: Pulmonary effort is normal. No respiratory distress.     Breath sounds: Normal breath sounds.  Abdominal:     General: Bowel sounds are normal. There is no distension.     Palpations: Abdomen is soft.     Tenderness: There is no abdominal tenderness.  Musculoskeletal:        General: Normal range of motion.     Cervical back: Neck supple.     Right lower leg: No edema.     Left lower leg: No edema.  Lymphadenopathy:     Cervical: No cervical adenopathy.  Skin:    General: Skin is warm and dry.  Neurological:     Mental Status: She is alert. Mental status is at baseline.  Psychiatric:        Mood and Affect: Mood normal.       Assessment/Plan:    Patient is being discharged with the following home health services:  pt/ot/rn: to evaluate and treat as indicated for gait balance strength adl training; medication management    Patient is being discharged with the following durable medical equipment:  rolling walker.   Patient has been advised to f/u with their PCP  in 1-2 weeks to for a transitions of care visit.  Social services at their facility was responsible for arranging this appointment.  Pt was provided with adequate prescriptions of noncontrolled medications to reach the scheduled appointment .  For controlled substances, a limited supply was provided as appropriate for the individual patient.  If the pt normally receives these medications from a pain clinic or has a contract with another physician, these medications should be received from that clinic or physician only).     30 day supply of her prescription medications have been sent to [facility].   Time spent with patient: 35 minutes home health medications dme  Ok Edwards NP [facility] Adult Medicine  call [phone]

## 2022-01-08 DIAGNOSIS — R69 Illness, unspecified: Secondary | ICD-10-CM | POA: Diagnosis not present

## 2022-01-08 DIAGNOSIS — E669 Obesity, unspecified: Secondary | ICD-10-CM | POA: Diagnosis not present

## 2022-01-08 DIAGNOSIS — Z6832 Body mass index (BMI) 32.0-32.9, adult: Secondary | ICD-10-CM | POA: Diagnosis not present

## 2022-01-08 DIAGNOSIS — I69391 Dysphagia following cerebral infarction: Secondary | ICD-10-CM | POA: Diagnosis not present

## 2022-01-08 DIAGNOSIS — I69351 Hemiplegia and hemiparesis following cerebral infarction affecting right dominant side: Secondary | ICD-10-CM | POA: Diagnosis not present

## 2022-01-08 DIAGNOSIS — I69320 Aphasia following cerebral infarction: Secondary | ICD-10-CM | POA: Diagnosis not present

## 2022-01-08 DIAGNOSIS — E7800 Pure hypercholesterolemia, unspecified: Secondary | ICD-10-CM | POA: Diagnosis not present

## 2022-01-08 DIAGNOSIS — I4891 Unspecified atrial fibrillation: Secondary | ICD-10-CM | POA: Diagnosis not present

## 2022-01-08 DIAGNOSIS — I10 Essential (primary) hypertension: Secondary | ICD-10-CM | POA: Diagnosis not present

## 2022-01-08 DIAGNOSIS — Z9049 Acquired absence of other specified parts of digestive tract: Secondary | ICD-10-CM | POA: Diagnosis not present

## 2022-01-08 DIAGNOSIS — Z85038 Personal history of other malignant neoplasm of large intestine: Secondary | ICD-10-CM | POA: Diagnosis not present

## 2022-01-08 DIAGNOSIS — Z7901 Long term (current) use of anticoagulants: Secondary | ICD-10-CM | POA: Diagnosis not present

## 2022-01-09 ENCOUNTER — Other Ambulatory Visit: Payer: Self-pay | Admitting: *Deleted

## 2022-01-09 DIAGNOSIS — E6609 Other obesity due to excess calories: Secondary | ICD-10-CM | POA: Diagnosis not present

## 2022-01-09 DIAGNOSIS — I4891 Unspecified atrial fibrillation: Secondary | ICD-10-CM | POA: Diagnosis not present

## 2022-01-09 DIAGNOSIS — I639 Cerebral infarction, unspecified: Secondary | ICD-10-CM | POA: Diagnosis not present

## 2022-01-09 DIAGNOSIS — I361 Nonrheumatic tricuspid (valve) insufficiency: Secondary | ICD-10-CM | POA: Diagnosis not present

## 2022-01-09 DIAGNOSIS — I10 Essential (primary) hypertension: Secondary | ICD-10-CM

## 2022-01-09 DIAGNOSIS — Z6831 Body mass index (BMI) 31.0-31.9, adult: Secondary | ICD-10-CM | POA: Diagnosis not present

## 2022-01-09 NOTE — Patient Outreach (Signed)
[facility] Post- Acute Care Coordinator follow up. Verified in [facility] Ms. Osbourne discharged from [facility] on 01/04/22.   Confirmed with [facility] SW, Ms. Guion home health thru [facility] was arranged.   [facility] social previously indicated Ms. Cobert could benefit from [facility] follow up. Lives with daughter who works during the day.   Ms. Cowens medical history of new onset atrial fibrillation, recent acute ischemic stroke, HTN, HLD, colon cancer.  Will make referral for [facility] RNCM for care coordination.    Marthenia Rolling, MSN, RN,BSN [place] Acute Care Coordinator [phone] (Direct dial)

## 2022-01-10 ENCOUNTER — Telehealth: Payer: Self-pay | Admitting: *Deleted

## 2022-01-10 NOTE — Progress Notes (Signed)
  Care Coordination  Outreach Note  01/10/2022 Name: GENEE RANN MRN: [id number] DOB: [birth year]   Care Coordination Outreach Attempts: An unsuccessful telephone outreach was attempted today to offer the patient information about available care coordination services as a benefit of their health plan.   Follow Up Plan:  Additional outreach attempts will be made to offer the patient care coordination information and services.   Encounter Outcome:  No Answer  [place]  Direct Dial: [phone]

## 2022-01-10 NOTE — Progress Notes (Signed)
  Care Coordination   Note   01/10/2022 Name: NATALYAH CUMMISKEY MRN: [id number] DOB: [birth year]  RAENETTE SAKATA is a 81 y.o. year old female who sees Hydro, Pharmacologist for primary care. I reached out to [name] by phone today to offer care coordination services.  Ms. Deines was given information about Care Coordination services today including:   The Care Coordination services include support from the care team which includes your Nurse Coordinator, Clinical Social Worker, or Pharmacist.  The Care Coordination team is here to help remove barriers to the health concerns and goals most important to you. Care Coordination services are voluntary, and the patient may decline or stop services at any time by request to their care team member.   Care Coordination Consent Status: Patient agreed to services and verbal consent obtained.   Follow up plan:  Telephone appointment with care coordination team member scheduled for:  01/11/22  Encounter Outcome:  Pt. Scheduled

## 2022-01-11 ENCOUNTER — Ambulatory Visit: Payer: Self-pay | Admitting: *Deleted

## 2022-01-11 ENCOUNTER — Encounter: Payer: Self-pay | Admitting: *Deleted

## 2022-01-11 ENCOUNTER — Ambulatory Visit (HOSPITAL_COMMUNITY): Payer: Medicare HMO

## 2022-01-11 ENCOUNTER — Telehealth: Payer: Self-pay | Admitting: *Deleted

## 2022-01-11 DIAGNOSIS — Z748 Other problems related to care provider dependency: Secondary | ICD-10-CM

## 2022-01-11 NOTE — Telephone Encounter (Signed)
   Telephone encounter was:  Unsuccessful.  01/11/2022 Name: Janice Curtis MRN: [id number] DOB: [birth year]  Unsuccessful outbound call made today to assist with:  Transportation Needs   Outreach Attempt:  1st Attempt  A HIPAA compliant voice message was left requesting a return call.  Instructed patient to call back at [phone].  [place] [phone] [address] , [place] [address] Email : [email or web address]

## 2022-01-11 NOTE — Patient Outreach (Signed)
  Care Coordination   Initial Visit Note   01/11/2022 Name: Janice Curtis MRN: [id number] DOB: [birth year]  Janice Curtis is a 81 y.o. year old female who sees [place], Pharmacologist for primary care. I spoke with  [name] by phone today.  What matters to the patients health and wellness today?  Management of Afib for stroke prevention. Obtaining needed medications and cardiology referral.    Goals Addressed             This Visit's Progress    Care Coordination Services       Care Coordination Interventions: Evaluation of current treatment plan related to Afib & recent CVA and patient's adherence to plan as established by provider Provided education to patient re: basic pathophysiology of AFib and it's role in causing strokes Reviewed medications with patient and discussed access and affordability. Thoroughly reviewed medications and determined that patient does not have Eliquis at home. This was prescribed at hosp discharge and at discharge from [facility] with [facility] [phone]  regarding Eliquis prescription. Left VM indicating that patient was prescribed Eliquis at SNF discharge but she does not have this in her possession. Questioned if they have the prescription on file and if the patient picked it up.   Reviewed scheduled/upcoming provider appointments including Dr Leonie Man (neuro) on 01/22/22 Care Guide referral for transportation assistance to neurology appt Discussed plans with patient for ongoing care management follow up and provided patient with direct contact information for care management team Assessed social determinant of health barriers Assess mobility and ability to perform ADLs Provided education on importance of blood thinners in management of blood clot and stroke prevention due to Afib Assessed family/social support Verified home health services through [facility] Discussed recent SNF discharge f/u with PCP, Dr  Hilma Favors Discussed that she does not have a cardiology referral.   RNCC to collaborate with Dr Delanna Ahmadi office regarding potential need for cardio to follow in [place] for new onset Afib Provided patient/caregiver with information regarding Care Coordination Services: Services include support from the care team which includes your Nurse Coordinator, Clinical Social Worker, or Pharmacist.  The Care Coordination team is here to help remove barriers to the health concerns and goals most important to you. Care Coordination services are voluntary, and the patient may decline or stop services at any time by request to their care team member.  Patient agreed to services Provided with [facility] direct contact number [phone] and encouraged to reach out as needed Telephone f/u scheduled with [facility] 01/16/22        SDOH assessments and interventions completed:  Yes  SDOH Interventions Today    Flowsheet Row Most Recent Value  SDOH Interventions   Housing Interventions Intervention Not Indicated  Transportation Interventions Ambulatory [id number] Order  Financial Strain Interventions Intervention Not Indicated        Care Coordination Interventions Activated:  Yes  Care Coordination Interventions:  Yes, provided   Follow up plan: Follow up call scheduled for 01/16/22    Encounter Outcome:  Pt. Visit Completed   Chong Sicilian, BSN, RN-BC RN Care Coordinator [place]: [phone] Main #: [phone]

## 2022-01-12 ENCOUNTER — Telehealth: Payer: Self-pay | Admitting: *Deleted

## 2022-01-12 NOTE — Telephone Encounter (Signed)
Janice Curtis DOB: October 02, 1940 MRN: [id number]   RIDER WAIVER AND RELEASE OF LIABILITY  For purposes of improving physical access to our facilities, [facility] is pleased to partner with third parties to provide [facility] patients or other authorized individuals the option of convenient, on-demand ground transportation services (the Technical brewer") through use of the technology service that enables users to request on-demand ground transportation from independent third-party providers.  By opting to use and accept these [employer], I, the undersigned, hereby agree on behalf of myself, and on behalf of any minor child using the Government social research officer for whom I am the parent or legal guardian, as follows:  Government social research officer provided to me are provided by independent third-party transportation providers who are not [employer] or employees and who are unaffiliated with [employer]. [facility] is neither a transportation carrier nor a common or public carrier. [facility] has no control over the quality or safety of the transportation that occurs as a result of the [employer]. [facility] cannot guarantee that any third-party transportation provider will complete any arranged transportation service. [facility] makes no representation, warranty, or guarantee regarding the reliability, timeliness, quality, safety, suitability, or availability of any of the Transport Services or that they will be error free. I fully understand that traveling by vehicle involves risks and dangers of serious bodily injury, including permanent disability, paralysis, and death. I agree, on behalf of myself and on behalf of any minor child using the Transport Services for whom I am the parent or legal guardian, that the entire risk arising out of my use of the [employer] remains solely with me, to the maximum extent permitted under applicable law. The [employer] are provided "as  is" and "as available." [facility] disclaims all representations and warranties, express, implied or statutory, not expressly set out in these terms, including the implied warranties of merchantability and fitness for a particular purpose. I hereby waive and release [facility], its agents, employees, officers, directors, representatives, insurers, attorneys, assigns, successors, subsidiaries, and affiliates from any and all past, present, or future claims, demands, liabilities, actions, causes of action, or suits of any kind directly or indirectly arising from acceptance and use of the [employer]. I further waive and release [facility] and its affiliates from all present and future liability and responsibility for any injury or death to persons or damages to property caused by or related to the use of the [employer]. I have read this Waiver and Release of Liability, and I understand the terms used in it and their legal significance. This Waiver is freely and voluntarily given with the understanding that my right (as well as the right of any minor child for whom I am the parent or legal guardian using the [employer]) to legal recourse against [facility] in connection with the [employer] is knowingly surrendered in return for use of these services.   I attest that I read the consent document to [name], gave Ms. Coronado the opportunity to ask questions and answered the questions asked (if any). I affirm that [name] then provided consent for she's participation in this program.     Bonnielee Haff                              Telephone encounter was:  Successful.  01/12/2022 Name: Janice Curtis MRN: [id number] DOB: 1940/10/22  Janice Curtis is a 81 y.o. year old female who is a primary care patient of [place], Pharmacologist . The community resource team was consulted for assistance with Transportation Needs   Care guide performed  the following interventions: Transportation.  patient refered to [facility] and also appt for [place] visit to be scheduled with [facility] transportation Transportation Follow Up Plan:  Care guide will follow up with patient by phone over the next days [place] [address] , [place] [address] Email : [email or web address]

## 2022-01-15 ENCOUNTER — Telehealth: Payer: Self-pay | Admitting: *Deleted

## 2022-01-16 ENCOUNTER — Ambulatory Visit: Payer: Self-pay | Admitting: *Deleted

## 2022-01-16 NOTE — Patient Outreach (Signed)
  Care Coordination   01/16/2022 Name: Janice Curtis MRN: [id number] DOB: [birth year]   Care Coordination Outreach Attempts:  An unsuccessful telephone outreach was attempted for a scheduled appointment today.  Follow Up Plan:  Additional outreach attempts will be made to offer the patient care coordination information and services.   Encounter Outcome:  No Answer  Care Coordination Interventions Activated:  No   Care Coordination Interventions:  No, not indicated    Collaboration with Dr Hilma Favors was successful and patient has an appt scheduled with cardiologist on 01/19/22. Referral for transportation assistance was also successful. [facility] prescription was received, filled, and picked up or not. Patient did not have this in her possession during initial call last week.   Chong Sicilian, BSN, RN-BC RN Care Coordinator [place] Direct Dial: [phone] Main #: [phone]

## 2022-01-18 NOTE — Progress Notes (Unsigned)
Cardiology Office Note   Date:  01/20/2022   ID:  Janice Curtis, DOB [birth year], MRN [id number]  PCP:  [facility]  Cardiologist:   Dorris Carnes, MD   Pt presents for follow up and atrial fibrillation  History of Present Illness: Janice Curtis is a 81 y.o. female with hx of tricuspid regurgitation, I saw her in clinic in 2021 The pt was hospitalized in October 2023 with CVA   She was found to be in atrial fibrillation at the time   P started on Elquis After hospital she was sen to rehab   She is now back at home   She denies palpitations   Breathing is OK   SHe denies CP       Not sleeping well   Sleeping in snippets   Overall says she is tired      Current Meds  Medication Sig   amLODipine (NORVASC) 5 MG tablet Take 1 tablet (5 mg total) by mouth daily.   apixaban (ELIQUIS) 5 MG TABS tablet Take 1 tablet (5 mg total) by mouth 2 (two) times daily.   metoprolol tartrate (LOPRESSOR) 25 MG tablet Take 0.5 tablets (12.5 mg total) by mouth 2 (two) times daily.   pantoprazole (PROTONIX) 40 MG tablet Take 1 tablet (40 mg total) by mouth at bedtime.   potassium chloride SA (KLOR-CON M) 20 MEQ tablet Take 2 tablets (40 mEq total) by mouth daily.   rosuvastatin (CRESTOR) 40 MG tablet Place 1 tablet (40 mg total) into feeding tube daily. (Patient taking differently: Take 40 mg by mouth daily.)     Allergies:   Patient has no known allergies.   Past Medical History:  Diagnosis Date   Anxiety    Colon cancer ([place])    Hypercholesterolemia    Hypertension    Obesity    Stroke ([place]) 12/05/2009    Past Surgical History:  Procedure Laterality Date   COLONOSCOPY  12/27/08   simple adenoma/inflammatory polyp at anastomosis   COLONOSCOPY  04/23/2011   Procedure: COLONOSCOPY;  Surgeon: Dorothyann Peng, MD;  Location[facility];  Service: Endoscopy;  Laterality: N/A;  10:00   IR CT HEAD LTD  12/09/2021   IR PERCUTANEOUS ART THROMBECTOMY/INFUSION INTRACRANIAL INC  DIAG ANGIO  12/09/2021   RADIOLOGY WITH ANESTHESIA N/A 12/09/2021   Procedure: RADIOLOGY WITH ANESTHESIA;  Surgeon: Radiologist, Medication, MD;  Location: [facility];  Service: Radiology;  Laterality: N/A;   RIGHT COLECTOMY  11/14/2007     Social History:  The patient  reports that she has never smoked. She has never used smokeless tobacco. She reports that she does not currently use alcohol. She reports that she does not use drugs.   Family History:  The patient's family history includes Heart attack in her mother; Stroke in her father.    ROS:  Please see the history of present illness. All other systems are reviewed and  Negative to the above problem except as noted.    PHYSICAL EXAM: VS:  BP 116/66   Pulse 70   Ht '5\' 3"'$  (1.6 m)   Wt 165 lb (74.8 kg)   SpO2 98%   BMI 29.23 kg/m   GEN:  Obese 81 yo  in no acute distress  HEENT: normal  Neck: no JVD, carotid bruits Cardiac: Irreg irreg    II/VI systolic murmr LSB  Tr  LE  edema  Respiratory:  clear to auscultation bilaterally, GI: soft, nontender, nondistended, + BS  No hepatomegaly  MS: no deformity Moving all extremities   Skin: warm and dry, no rash Neuro:  Strength and sensation are intact Psych: euthymic mood, full affect   EKG:  EKG is not ordered    Echo   12/09/21  eft ventricular ejection fraction, by estimation, is 60 to 65%. The left ventricle has normal function. The left ventricle has no regional wall motion abnormalities. There is moderate left ventricular hypertrophy of the basal-septal segment. Left ventricular diastolic parameters are indeterminate. 1. Right ventricular systolic function is normal. The right ventricular size is normal. There is normal pulmonary artery systolic pressure. The estimated right ventricular systolic pressure is 01.7 mmHg. 2. The mitral valve is normal in structure. No evidence of mitral valve regurgitation. No evidence of mitral stenosis. 3. 4. Tricuspid valve regurgitation is  moderate. The aortic valve is tricuspid. Aortic valve regurgitation is mild. No aortic stenosis is present. 5. 6. Pulmonic valve regurgitation is moderate. The inferior vena cava is normal in size with greater than 50% respiratory variability, suggesting right atrial pressure of 3 mmHg.   Lipid Panel    Component Value Date/Time   CHOL 125 01/19/2022 1207   TRIG 120 01/19/2022 1207   HDL 47 01/19/2022 1207   CHOLHDL 2.7 01/19/2022 1207   VLDL 24 01/19/2022 1207   LDLCALC 54 01/19/2022 1207      Wt Readings from Last 3 Encounters:  01/19/22 165 lb (74.8 kg)  01/03/22 171 lb 3.2 oz (77.7 kg)  01/01/22 171 lb 9.6 oz (77.8 kg)      ASSESSMENT AND PLAN:  1  Atrial fibrillation   Recent dx after CVA   ON Eliquis  Will se up for a 72 hr monitor to evaluat rate control  2HTN  BP is well controlled     3  HL   Last LDL 131   She is on Crestor 40 mg  Will check lipomed      4  Hx CVA    5  TR   Remains moderate     F/U in Summer 2024  Current medicines are reviewed at length with the patient today.  The patient does not have concerns regarding medicines.  Signed, Dorris Carnes, MD  01/20/2022 9:41 PM    [address], [place], [place]  [address] Phone: [phone]; Fax: [phone]

## 2022-01-19 ENCOUNTER — Ambulatory Visit: Payer: Medicare HMO

## 2022-01-19 ENCOUNTER — Ambulatory Visit: Payer: Medicare HMO | Admitting: Internal Medicine

## 2022-01-19 ENCOUNTER — Encounter: Payer: Self-pay | Admitting: Internal Medicine

## 2022-01-19 ENCOUNTER — Other Ambulatory Visit
Admission: RE | Admit: 2022-01-19 | Discharge: 2022-01-19 | Disposition: A | Discharge status: Home / Self Care | Payer: Medicare HMO | Source: Ambulatory Visit | Attending: Internal Medicine | Admitting: Internal Medicine

## 2022-01-19 VITALS — BP 116/66 | HR 70 | Ht 63.0 in | Wt 165.0 lb

## 2022-01-19 DIAGNOSIS — E782 Mixed hyperlipidemia: Secondary | ICD-10-CM

## 2022-01-19 DIAGNOSIS — I4891 Unspecified atrial fibrillation: Secondary | ICD-10-CM | POA: Diagnosis not present

## 2022-01-19 LAB — LIPID PANEL
Cholesterol: 125 mg/dL (ref 0–200)
HDL: 47 mg/dL (ref >40)
LDL Cholesterol: 54 mg/dL (ref 0–99)
Total CHOL/HDL Ratio: 2.7 RATIO
Triglycerides: 120 mg/dL (ref <150)
VLDL: 24 mg/dL (ref 0–40)

## 2022-01-19 NOTE — Patient Instructions (Signed)
Medication Instructions:   Your physician recommends that you continue on your current medications as directed. Please refer to the Current Medication list given to you today.  *If you need a refill on your cardiac medications before your next appointment, please call your pharmacy*   Lab Work: Your physician recommends that you return for lab work in: Today   If you have labs (blood work) drawn today and your tests are completely normal, you will receive your results only by: MyChart Message (if you have MyChart) OR A paper copy in the mail If you have any lab test that is abnormal or we need to change your treatment, we will call you to review the results.   Testing/Procedures: [facility] [place]- Long Term Monitor Instructions   Your physician has requested you wear your ZIO patch monitor___3____days.  You may remove on 01/22/22   This is a single patch monitor.  [employer] supplies one patch monitor per enrollment.  Additional stickers are not available.   Please do not apply patch if you will be having a Nuclear Stress Test, Echocardiogram, Cardiac CT, MRI, or Chest Xray during the time frame you would be wearing the monitor. The patch cannot be worn during these tests.  You cannot remove and re-apply the ZIO XT patch monitor.   Your ZIO patch monitor will be sent USPS Priority mail from [facility] directly to your home address. The monitor may also be mailed to a PO BOX if home delivery is not available.   It may take 3-5 days to receive your monitor after you have been enrolled.   Once you have received you monitor, please review enclosed instructions.  Your monitor has already been registered assigning a specific monitor serial # to you.   Applying the monitor   Shave hair from upper left chest.   Hold abrader disc by orange tab.  Rub abrader in 40 strokes over left upper chest as indicated in your monitor instructions.   Clean area with 4 enclosed alcohol pads .  Use all pads  to assure are is cleaned thoroughly.  Let dry.   Apply patch as indicated in monitor instructions.  Patch will be place under collarbone on left side of chest with arrow pointing upward.   Rub patch adhesive wings for 2 minutes.Remove white label marked "1".  Remove white label marked "2".  Rub patch adhesive wings for 2 additional minutes.   While looking in a mirror, press and release button in center of patch.  A small green light will flash 3-4 times .  This will be your only indicator the monitor has been turned on.     Do not shower for the first 24 hours.  You may shower after the first 24 hours.   Press button if you feel a symptom. You will hear a small click.  Record Date, Time and Symptom in the Patient Log Book.   When you are ready to remove patch, follow instructions on last 2 pages of Patient Log Book.  Stick patch monitor onto last page of Patient Log Book.   Place Patient Log Book in [facility] box.  Use locking tab on box and tape box closed securely.  The [employer] and [employer] has [employer] on it.  Please place in mailbox as soon as possible.  Your physician should have your test results approximately 7 days after the monitor has been mailed back to [facility].   Call [place] at [phone] if you  have questions regarding your ZIO XT patch monitor.  Call them immediately if you see an orange light blinking on your monitor.   If your monitor falls off in less than 4 days contact our Monitor department at [phone].  If your monitor becomes loose or falls off after 4 days call [employer] at [phone] for suggestions on securing your monitor.     Follow-Up: At [facility], you and your health needs are our priority.  As part of our continuing mission to provide you with exceptional heart care, we have created designated Provider Care Teams.  These Care Teams include your primary Cardiologist (physician) and Advanced Practice Providers  (APPs -  Physician Assistants and Nurse Practitioners) who all work together to provide you with the care you need, when you need it.  We recommend signing up for the patient portal called "MyChart".  Sign up information is provided on this After Visit Summary.  MyChart is used to connect with patients for Virtual Visits (Telemedicine).  Patients are able to view lab/test results, encounter notes, upcoming appointments, etc.  Non-urgent messages can be sent to your provider as well.   To learn more about what you can do with MyChart, go to [email or web address].    Your next appointment:    August   The format for your next appointment:   In Person  Provider:   Dorris Carnes, MD    Other Instructions Thank you for choosing [facility]!    Important Information About [facility]

## 2022-01-22 ENCOUNTER — Telehealth: Payer: Self-pay

## 2022-01-22 ENCOUNTER — Inpatient Hospital Stay: Payer: Medicare HMO | Admitting: Neurology

## 2022-01-22 NOTE — Telephone Encounter (Signed)
Left message to call back for lab results.

## 2022-01-24 NOTE — Telephone Encounter (Signed)
Janice Alar Pinnix, LPN 83/37/4451  4:60 PM EST Back to Top    Pt notified   Fay Records, MD 01/19/2022  8:11 PM EST     Lipids are excellent    Keep on same meds

## 2022-01-28 ENCOUNTER — Observation Stay (HOSPITAL_COMMUNITY): Payer: Medicare HMO

## 2022-01-28 ENCOUNTER — Encounter (HOSPITAL_COMMUNITY): Payer: Self-pay

## 2022-01-28 ENCOUNTER — Emergency Department (HOSPITAL_COMMUNITY): Payer: Medicare HMO

## 2022-01-28 ENCOUNTER — Other Ambulatory Visit: Payer: Self-pay

## 2022-01-28 ENCOUNTER — Observation Stay (HOSPITAL_COMMUNITY)
Admission: EM | Admit: 2022-01-28 | Discharge: 2022-01-29 | Disposition: A | Discharge status: Home Health | Payer: Medicare HMO | Attending: Internal Medicine | Admitting: Internal Medicine

## 2022-01-28 DIAGNOSIS — Z8673 Personal history of transient ischemic attack (TIA), and cerebral infarction without residual deficits: Secondary | ICD-10-CM | POA: Insufficient documentation

## 2022-01-28 DIAGNOSIS — N3000 Acute cystitis without hematuria: Secondary | ICD-10-CM | POA: Insufficient documentation

## 2022-01-28 DIAGNOSIS — Z1152 Encounter for screening for COVID-19: Secondary | ICD-10-CM | POA: Diagnosis not present

## 2022-01-28 DIAGNOSIS — Z743 Need for continuous supervision: Secondary | ICD-10-CM | POA: Diagnosis not present

## 2022-01-28 DIAGNOSIS — I4891 Unspecified atrial fibrillation: Secondary | ICD-10-CM

## 2022-01-28 DIAGNOSIS — R109 Unspecified abdominal pain: Secondary | ICD-10-CM | POA: Insufficient documentation

## 2022-01-28 DIAGNOSIS — R2689 Other abnormalities of gait and mobility: Secondary | ICD-10-CM | POA: Insufficient documentation

## 2022-01-28 DIAGNOSIS — A419 Sepsis, unspecified organism: Secondary | ICD-10-CM | POA: Diagnosis not present

## 2022-01-28 DIAGNOSIS — R5383 Other fatigue: Secondary | ICD-10-CM | POA: Diagnosis not present

## 2022-01-28 DIAGNOSIS — R197 Diarrhea, unspecified: Secondary | ICD-10-CM | POA: Diagnosis not present

## 2022-01-28 DIAGNOSIS — I10 Essential (primary) hypertension: Secondary | ICD-10-CM | POA: Diagnosis not present

## 2022-01-28 DIAGNOSIS — I4819 Other persistent atrial fibrillation: Principal | ICD-10-CM | POA: Diagnosis present

## 2022-01-28 DIAGNOSIS — I517 Cardiomegaly: Secondary | ICD-10-CM | POA: Diagnosis not present

## 2022-01-28 DIAGNOSIS — Z85038 Personal history of other malignant neoplasm of large intestine: Secondary | ICD-10-CM | POA: Insufficient documentation

## 2022-01-28 DIAGNOSIS — R531 Weakness: Secondary | ICD-10-CM | POA: Insufficient documentation

## 2022-01-28 DIAGNOSIS — K219 Gastro-esophageal reflux disease without esophagitis: Secondary | ICD-10-CM | POA: Diagnosis not present

## 2022-01-28 DIAGNOSIS — R112 Nausea with vomiting, unspecified: Secondary | ICD-10-CM | POA: Diagnosis present

## 2022-01-28 DIAGNOSIS — R55 Syncope and collapse: Secondary | ICD-10-CM | POA: Diagnosis not present

## 2022-01-28 DIAGNOSIS — R2681 Unsteadiness on feet: Secondary | ICD-10-CM | POA: Diagnosis not present

## 2022-01-28 DIAGNOSIS — E782 Mixed hyperlipidemia: Secondary | ICD-10-CM

## 2022-01-28 DIAGNOSIS — M6281 Muscle weakness (generalized): Secondary | ICD-10-CM | POA: Insufficient documentation

## 2022-01-28 DIAGNOSIS — Z79899 Other long term (current) drug therapy: Secondary | ICD-10-CM | POA: Diagnosis not present

## 2022-01-28 DIAGNOSIS — E876 Hypokalemia: Secondary | ICD-10-CM | POA: Diagnosis present

## 2022-01-28 DIAGNOSIS — Z7901 Long term (current) use of anticoagulants: Secondary | ICD-10-CM | POA: Diagnosis not present

## 2022-01-28 DIAGNOSIS — K760 Fatty (change of) liver, not elsewhere classified: Secondary | ICD-10-CM | POA: Diagnosis not present

## 2022-01-28 DIAGNOSIS — R61 Generalized hyperhidrosis: Secondary | ICD-10-CM | POA: Diagnosis not present

## 2022-01-28 DIAGNOSIS — I959 Hypotension, unspecified: Secondary | ICD-10-CM | POA: Diagnosis not present

## 2022-01-28 DIAGNOSIS — E785 Hyperlipidemia, unspecified: Secondary | ICD-10-CM | POA: Diagnosis present

## 2022-01-28 DIAGNOSIS — N390 Urinary tract infection, site not specified: Secondary | ICD-10-CM | POA: Diagnosis present

## 2022-01-28 LAB — CBC WITH DIFFERENTIAL/PLATELET
Abs Immature Granulocytes: 0 10*3/uL (ref 0.00–0.07)
Basophils Absolute: 0 10*3/uL (ref 0.0–0.1)
Basophils Relative: 1 %
Eosinophils Absolute: 0.1 10*3/uL (ref 0.0–0.5)
Eosinophils Relative: 2 %
HCT: 40.4 % (ref 36.0–46.0)
Hemoglobin: 13.2 g/dL (ref 12.0–15.0)
Immature Granulocytes: 0 %
Lymphocytes Relative: 45 %
Lymphs Abs: 1.3 10*3/uL (ref 0.7–4.0)
MCH: 26 pg (ref 26.0–34.0)
MCHC: 32.7 g/dL (ref 30.0–36.0)
MCV: 79.7 fL — ABNORMAL LOW (ref 80.0–100.0)
Monocytes Absolute: 0.2 10*3/uL (ref 0.1–1.0)
Monocytes Relative: 6 %
Neutro Abs: 1.4 10*3/uL — ABNORMAL LOW (ref 1.7–7.7)
Neutrophils Relative %: 46 %
Platelets: 153 10*3/uL (ref 150–400)
RBC: 5.07 MIL/uL (ref 3.87–5.11)
RDW: 15.9 % — ABNORMAL HIGH (ref 11.5–15.5)
WBC: 2.9 10*3/uL — ABNORMAL LOW (ref 4.0–10.5)
nRBC: 0 % (ref 0.0–0.2)

## 2022-01-28 LAB — RESP PANEL BY RT-PCR (FLU A&B, COVID) ARPGX2
Influenza A by PCR: NEGATIVE
Influenza B by PCR: NEGATIVE
SARS Coronavirus 2 by RT PCR: NEGATIVE

## 2022-01-28 LAB — URINALYSIS, ROUTINE W REFLEX MICROSCOPIC
Bilirubin Urine: NEGATIVE
Glucose, UA: NEGATIVE mg/dL
Ketones, ur: NEGATIVE mg/dL
Nitrite: NEGATIVE
Protein, ur: 30 mg/dL — AB
Specific Gravity, Urine: 1.009 (ref 1.005–1.030)
pH: 7 (ref 5.0–8.0)

## 2022-01-28 LAB — I-STAT CHEM 8, ED
BUN: 6 mg/dL — ABNORMAL LOW (ref 8–23)
Calcium, Ion: 1.23 mmol/L (ref 1.15–1.40)
Chloride: 102 mmol/L (ref 98–111)
Creatinine, Ser: 1.1 mg/dL — ABNORMAL HIGH (ref 0.44–1.00)
Glucose, Bld: 142 mg/dL — ABNORMAL HIGH (ref 70–99)
HCT: 43 % (ref 36.0–46.0)
Hemoglobin: 14.6 g/dL (ref 12.0–15.0)
Potassium: 3.2 mmol/L — ABNORMAL LOW (ref 3.5–5.1)
Sodium: 137 mmol/L (ref 135–145)
TCO2: 20 mmol/L — ABNORMAL LOW (ref 22–32)

## 2022-01-28 LAB — COMPREHENSIVE METABOLIC PANEL
ALT: 20 U/L (ref 0–44)
AST: 33 U/L (ref 15–41)
Albumin: 3.8 g/dL (ref 3.5–5.0)
Alkaline Phosphatase: 66 U/L (ref 38–126)
Anion gap: 11 (ref 5–15)
BUN: 9 mg/dL (ref 8–23)
CO2: 21 mmol/L — ABNORMAL LOW (ref 22–32)
Calcium: 9.6 mg/dL (ref 8.9–10.3)
Chloride: 103 mmol/L (ref 98–111)
Creatinine, Ser: 1.13 mg/dL — ABNORMAL HIGH (ref 0.44–1.00)
GFR, Estimated: 49 mL/min — ABNORMAL LOW (ref >60)
Glucose, Bld: 144 mg/dL — ABNORMAL HIGH (ref 70–99)
Potassium: 3.1 mmol/L — ABNORMAL LOW (ref 3.5–5.1)
Sodium: 135 mmol/L (ref 135–145)
Total Bilirubin: 0.8 mg/dL (ref 0.3–1.2)
Total Protein: 7.2 g/dL (ref 6.5–8.1)

## 2022-01-28 LAB — MAGNESIUM: Magnesium: 1.7 mg/dL (ref 1.7–2.4)

## 2022-01-28 LAB — BLOOD GAS, VENOUS
Acid-base deficit: 2.5 mmol/L — ABNORMAL HIGH (ref 0.0–2.0)
Bicarbonate: 22.5 mmol/L (ref 20.0–28.0)
Drawn by: 7073
O2 Saturation: 83.9 %
Patient temperature: 36.4
pCO2, Ven: 38 mmHg — ABNORMAL LOW (ref 44–60)
pH, Ven: 7.38 (ref 7.25–7.43)
pO2, Ven: 49 mmHg — ABNORMAL HIGH (ref 32–45)

## 2022-01-28 LAB — PROTIME-INR
INR: 1.1 (ref 0.8–1.2)
Prothrombin Time: 14.1 seconds (ref 11.4–15.2)

## 2022-01-28 LAB — LACTIC ACID, PLASMA: Lactic Acid, Venous: 1.9 mmol/L (ref 0.5–1.9)

## 2022-01-28 LAB — LIPASE, BLOOD: Lipase: 38 U/L (ref 11–51)

## 2022-01-28 LAB — TROPONIN I (HIGH SENSITIVITY)
Troponin I (High Sensitivity): 6 ng/L (ref <18)
Troponin I (High Sensitivity): 4 ng/L (ref <18)

## 2022-01-28 MED ORDER — ALUM & MAG HYDROXIDE-SIMETH 200-200-20 MG/5ML PO SUSP
30.0000 mL | Freq: Once | ORAL | Status: AC
  Administered 2022-01-28: 30 mL via ORAL
  Filled 2022-01-28: qty 30

## 2022-01-28 MED ORDER — ONDANSETRON HCL 4 MG PO TABS: 4.0000 mg | ORAL_TABLET | Freq: Four times a day (QID) | ORAL | Status: DC | PRN

## 2022-01-28 MED ORDER — METOPROLOL TARTRATE 25 MG PO TABS
12.5000 mg | ORAL_TABLET | Freq: Two times a day (BID) | ORAL | Status: DC
  Administered 2022-01-28: 12.5 mg via ORAL
  Filled 2022-01-28: qty 1

## 2022-01-28 MED ORDER — METOPROLOL TARTRATE 5 MG/5ML IV SOLN
5.0000 mg | Freq: Once | INTRAVENOUS | Status: AC
  Administered 2022-01-28: 5 mg via INTRAVENOUS
  Filled 2022-01-28: qty 5

## 2022-01-28 MED ORDER — APIXABAN 5 MG PO TABS
5.0000 mg | ORAL_TABLET | Freq: Two times a day (BID) | ORAL | Status: DC
  Administered 2022-01-28 – 2022-01-29 (×2): 5 mg via ORAL
  Filled 2022-01-28 (×2): qty 1

## 2022-01-28 MED ORDER — METOCLOPRAMIDE HCL 5 MG/ML IJ SOLN
5.0000 mg | Freq: Once | INTRAMUSCULAR | Status: AC
  Administered 2022-01-28: 5 mg via INTRAVENOUS
  Filled 2022-01-28: qty 2

## 2022-01-28 MED ORDER — LACTATED RINGERS IV BOLUS (SEPSIS)
1000.0000 mL | Freq: Once | INTRAVENOUS | Status: AC
  Administered 2022-01-28: 1000 mL via INTRAVENOUS

## 2022-01-28 MED ORDER — LACTATED RINGERS IV BOLUS (SEPSIS): 500.0000 mL | Freq: Once | INTRAVENOUS | Status: DC

## 2022-01-28 MED ORDER — METOPROLOL TARTRATE 25 MG PO TABS
25.0000 mg | ORAL_TABLET | Freq: Once | ORAL | Status: AC
  Administered 2022-01-28: 25 mg via ORAL
  Filled 2022-01-28: qty 1

## 2022-01-28 MED ORDER — IOHEXOL 300 MG/ML  SOLN
80.0000 mL | Freq: Once | INTRAMUSCULAR | Status: AC | PRN
  Administered 2022-01-28: 80 mL via INTRAVENOUS

## 2022-01-28 MED ORDER — ROSUVASTATIN CALCIUM 20 MG PO TABS
40.0000 mg | ORAL_TABLET | Freq: Every day | ORAL | Status: DC
  Administered 2022-01-29: 40 mg via ORAL
  Filled 2022-01-28: qty 2

## 2022-01-28 MED ORDER — SODIUM CHLORIDE 0.9 % IV SOLN
1.0000 g | Freq: Once | INTRAVENOUS | Status: AC
  Administered 2022-01-28: 1 g via INTRAVENOUS
  Filled 2022-01-28: qty 10

## 2022-01-28 MED ORDER — POTASSIUM CHLORIDE IN NACL 40-0.9 MEQ/L-% IV SOLN: INTRAVENOUS | Status: DC

## 2022-01-28 MED ORDER — MAGNESIUM SULFATE 2 GM/50ML IV SOLN
2.0000 g | Freq: Once | INTRAVENOUS | Status: AC
  Administered 2022-01-28: 2 g via INTRAVENOUS
  Filled 2022-01-28: qty 50

## 2022-01-28 MED ORDER — PANTOPRAZOLE SODIUM 40 MG PO TBEC
40.0000 mg | DELAYED_RELEASE_TABLET | Freq: Every day | ORAL | Status: DC
  Administered 2022-01-28: 40 mg via ORAL
  Filled 2022-01-28: qty 1

## 2022-01-28 MED ORDER — POTASSIUM CHLORIDE CRYS ER 20 MEQ PO TBCR
40.0000 meq | EXTENDED_RELEASE_TABLET | Freq: Every day | ORAL | Status: DC
  Administered 2022-01-28: 40 meq via ORAL
  Filled 2022-01-28: qty 2

## 2022-01-28 MED ORDER — ACETAMINOPHEN 325 MG PO TABS
650.0000 mg | ORAL_TABLET | Freq: Four times a day (QID) | ORAL | Status: DC | PRN
  Administered 2022-01-28: 650 mg via ORAL
  Filled 2022-01-28: qty 2

## 2022-01-28 MED ORDER — ACETAMINOPHEN 650 MG RE SUPP: 650.0000 mg | Freq: Four times a day (QID) | RECTAL | Status: DC | PRN

## 2022-01-28 MED ORDER — SODIUM CHLORIDE 0.9 % IV SOLN
1.0000 g | INTRAVENOUS | Status: DC
  Administered 2022-01-29: 1 g via INTRAVENOUS
  Filled 2022-01-28: qty 10

## 2022-01-28 MED ORDER — FAMOTIDINE IN NACL 20-0.9 MG/50ML-% IV SOLN
20.0000 mg | Freq: Once | INTRAVENOUS | Status: AC
  Administered 2022-01-28: 20 mg via INTRAVENOUS
  Filled 2022-01-28: qty 50

## 2022-01-28 MED ORDER — METOPROLOL TARTRATE 25 MG PO TABS: 25.0000 mg | ORAL_TABLET | Freq: Two times a day (BID) | ORAL | Status: DC

## 2022-01-28 MED ORDER — FENTANYL CITRATE PF 50 MCG/ML IJ SOSY
25.0000 ug | PREFILLED_SYRINGE | Freq: Once | INTRAMUSCULAR | Status: AC
  Administered 2022-01-28: 25 ug via INTRAVENOUS
  Filled 2022-01-28: qty 1

## 2022-01-28 MED ORDER — ONDANSETRON HCL 4 MG/2ML IJ SOLN: 4.0000 mg | Freq: Four times a day (QID) | INTRAMUSCULAR | Status: DC | PRN

## 2022-01-28 MED ORDER — LIDOCAINE VISCOUS HCL 2 % MT SOLN
15.0000 mL | Freq: Once | OROMUCOSAL | Status: AC
  Administered 2022-01-28: 15 mL via ORAL
  Filled 2022-01-28: qty 15

## 2022-01-28 MED ORDER — POTASSIUM CHLORIDE 20 MEQ PO PACK
40.0000 meq | PACK | Freq: Once | ORAL | Status: AC
  Administered 2022-01-28: 40 meq via ORAL
  Filled 2022-01-28: qty 2

## 2022-01-28 MED ORDER — AMLODIPINE BESYLATE 5 MG PO TABS
5.0000 mg | ORAL_TABLET | Freq: Every day | ORAL | Status: DC
  Administered 2022-01-28 – 2022-01-29 (×2): 5 mg via ORAL
  Filled 2022-01-28 (×2): qty 1

## 2022-01-28 NOTE — ED Notes (Signed)
Attempted report x 1. RN to call back.

## 2022-01-28 NOTE — ED Notes (Signed)
Pt c/o epigastric pain. Pt had one episode of emesis. Pt also states she has been urinating frequently- denies having any diarrhea lately.

## 2022-01-28 NOTE — ED Provider Notes (Signed)
[facility] EMERGENCY DEPARTMENT Provider Note   CSN: [id number] Arrival date & time: 01/28/22  8466     History  Chief Complaint  Patient presents with   Loss of Consciousness    Janice Curtis is a 81 y.o. female.  HPI Patient presents for generalized weakness.  Includes HTN, colon cancer, CVA, HLD, atrial fibrillation, GERD, anemia.  Yesterday, patient was in her normal state of health.  Overnight, she had epigastric pain.  She denies any recent diarrhea.  She has had urinary frequency.  This morning, she took some Pepto-Bismol to treat her abdominal pain.  She did not take any other morning medications.  She sat down to eat some cereal.  While sitting in her chair, patient had worsened generalized weakness.  This caused her to slump down.  This was witnessed by family.  Family reports that she never fully passed out.  She did seem very weak and lethargic.  They laid her down and called EMS.  When EMS arrived, they noted hypotension.  No interventions were given prior to arrival.  CBG was normal.  Twelve-lead EKG showed normal heart rate with a regular rhythm.  Patient currently endorses fatigue and generalized weakness.  She denies current nausea or abdominal pain.    Home Medications Prior to Admission medications   Medication Sig Start Date End Date Taking? Authorizing Provider  amLODipine (NORVASC) 5 MG tablet Take 1 tablet (5 mg total) by mouth daily. Patient taking differently: Take 5 mg by mouth in the morning. 01/03/22  Yes Gerlene Fee, NP  apixaban (ELIQUIS) 5 MG TABS tablet Take 1 tablet (5 mg total) by mouth 2 (two) times daily. 01/03/22  Yes Gerlene Fee, NP  metoprolol tartrate (LOPRESSOR) 25 MG tablet Take 0.5 tablets (12.5 mg total) by mouth 2 (two) times daily. 01/03/22  Yes Gerlene Fee, NP  pantoprazole (PROTONIX) 40 MG tablet Take 1 tablet (40 mg total) by mouth at bedtime. 01/03/22  Yes Gerlene Fee, NP  potassium chloride SA (KLOR-CON M) 20 MEQ  tablet Take 2 tablets (40 mEq total) by mouth daily. Patient taking differently: Take 40 mEq by mouth in the morning. 01/03/22  Yes Gerlene Fee, NP  rosuvastatin (CRESTOR) 40 MG tablet Place 1 tablet (40 mg total) into feeding tube daily. Patient taking differently: Take 40 mg by mouth daily. 01/03/22  Yes Gerlene Fee, NP  diclofenac Sodium (VOLTAREN) 1 % GEL Apply 2 g topically in the morning and at bedtime. Patient not taking: Reported on 01/19/2022 01/03/22   Gerlene Fee, NP      Allergies    Patient has no known allergies.    Review of Systems   Review of Systems  Constitutional:  Positive for fatigue.  Gastrointestinal:  Positive for abdominal pain.  Neurological:  Positive for weakness (Generalized).  All other systems reviewed and are negative.   Physical Exam Updated Vital Signs BP (!) 113/102   Pulse (!) 111   Temp 97.6 F (36.4 C) (Oral)   Resp (!) 22   Ht '5\' 3"'$  (1.6 m)   Wt 74.8 kg   SpO2 97%   BMI 29.23 kg/m  Physical Exam Vitals and nursing note reviewed.  Constitutional:      General: She is not in acute distress.    Appearance: Normal appearance. She is well-developed. She is ill-appearing. She is not toxic-appearing or diaphoretic.  HENT:     Head: Normocephalic and atraumatic.     Right Ear:  External ear normal.     Left Ear: External ear normal.     Nose: Nose normal.     Mouth/Throat:     Mouth: Mucous membranes are moist.     Pharynx: Oropharynx is clear.  Eyes:     Extraocular Movements: Extraocular movements intact.     Conjunctiva/sclera: Conjunctivae normal.  Cardiovascular:     Rate and Rhythm: Normal rate. Rhythm irregular.     Heart sounds: No murmur heard. Pulmonary:     Effort: Pulmonary effort is normal. No respiratory distress.     Breath sounds: Normal breath sounds. No wheezing or rales.  Chest:     Chest wall: No tenderness.  Abdominal:     General: There is no distension.     Palpations: Abdomen is soft.      Tenderness: There is no abdominal tenderness.  Musculoskeletal:        General: No swelling. Normal range of motion.     Cervical back: Normal range of motion and neck supple.  Skin:    General: Skin is warm and dry.     Capillary Refill: Capillary refill takes less than 2 seconds.     Coloration: Skin is not jaundiced or pale.  Neurological:     General: No focal deficit present.     Mental Status: She is alert and oriented to person, place, and time.  Psychiatric:        Mood and Affect: Mood normal.        Behavior: Behavior normal.        Thought Content: Thought content normal.        Judgment: Judgment normal.     ED Results / Procedures / Treatments   Labs (all labs ordered are listed, but only abnormal results are displayed) Labs Reviewed  COMPREHENSIVE METABOLIC PANEL - Abnormal; Notable for the following components:      Result Value   Potassium 3.1 (*)    CO2 21 (*)    Glucose, Bld 144 (*)    Creatinine, Ser 1.13 (*)    GFR, Estimated 49 (*)    All other components within normal limits  CBC WITH DIFFERENTIAL/PLATELET - Abnormal; Notable for the following components:   WBC 2.9 (*)    MCV 79.7 (*)    RDW 15.9 (*)    Neutro Abs 1.4 (*)    All other components within normal limits  URINALYSIS, ROUTINE W REFLEX MICROSCOPIC - Abnormal; Notable for the following components:   Color, Urine STRAW (*)    Hgb urine dipstick MODERATE (*)    Protein, ur 30 (*)    Leukocytes,Ua TRACE (*)    Bacteria, UA RARE (*)    All other components within normal limits  BLOOD GAS, VENOUS - Abnormal; Notable for the following components:   pCO2, Ven 38 (*)    pO2, Ven 49 (*)    Acid-base deficit 2.5 (*)    All other components within normal limits  I-STAT CHEM 8, ED - Abnormal; Notable for the following components:   Potassium 3.2 (*)    BUN 6 (*)    Creatinine, Ser 1.10 (*)    Glucose, Bld 142 (*)    TCO2 20 (*)    All other components within normal limits  CULTURE, BLOOD  (ROUTINE X 2)  CULTURE, BLOOD (ROUTINE X 2)  RESP PANEL BY RT-PCR (FLU A&B, COVID) ARPGX2  URINE CULTURE  LACTIC ACID, PLASMA  PROTIME-INR  MAGNESIUM  LIPASE, BLOOD  TROPONIN I (  HIGH SENSITIVITY)  TROPONIN I (HIGH SENSITIVITY)    EKG EKG Interpretation  Date/Time:  Sunday January 28 2022 08:37:57 EST Ventricular Rate:  126 PR Interval:    QRS Duration: 107 QT Interval:  338 QTC Calculation: 490 R Axis:   -62 Text Interpretation: Atrial fibrillation Ventricular premature complex Left anterior fascicular block Abnormal R-wave progression, late transition LVH with secondary repolarization abnormality Borderline prolonged QT interval Confirmed by Godfrey Pick [phone]) on 01/28/2022 9:37:24 AM  Radiology CT ABDOMEN PELVIS W CONTRAST  Result Date: 01/28/2022 CLINICAL DATA:  Abdominal pain. EXAM: CT ABDOMEN AND PELVIS WITH CONTRAST TECHNIQUE: Multidetector CT imaging of the abdomen and pelvis was performed using the standard protocol following bolus administration of intravenous contrast. RADIATION DOSE REDUCTION: This exam was performed according to the departmental dose-optimization program which includes automated exposure control, adjustment of the mA and/or kV according to patient size and/or use of iterative reconstruction technique. CONTRAST:  63m OMNIPAQUE IOHEXOL 300 MG/ML  SOLN COMPARISON:  01/23/2013 FINDINGS: Lower chest: Unremarkable. Hepatobiliary: The liver shows diffusely decreased attenuation suggesting fat deposition. No suspicious focal abnormality within the liver parenchyma. 8 mm hypodensity in the lateral segment left liver is stable consistent with benign etiology. No followup imaging is recommended. Small area of low attenuation in the anterior liver, adjacent to the falciform ligament, is in a characteristic location for focal fatty deposition. There is no evidence for gallstones, gallbladder wall thickening, or pericholecystic fluid. No intrahepatic or extrahepatic  biliary dilation. Pancreas: No focal mass lesion. No dilatation of the main duct. No intraparenchymal cyst. No peripancreatic edema. Spleen: No splenomegaly. Tiny rim calcified cyst in the spleen is stable since prior study consistent with benign process. No followup imaging is recommended. Adrenals/Urinary Tract: No adrenal nodule or mass. Cortical scarring noted both kidneys. No suspicious renal mass lesion. No evidence for hydroureter. The urinary bladder appears normal for the degree of distention. Stomach/Bowel: Stomach is unremarkable. No gastric wall thickening. No evidence of outlet obstruction. Duodenum is normally positioned as is the ligament of Treitz. No small bowel wall thickening. No small bowel dilatation. Status post right hemicolectomy No gross colonic mass. No colonic wall thickening. Vascular/Lymphatic: There is advanced atherosclerotic calcification of the abdominal aorta without aneurysm. There is no gastrohepatic or hepatoduodenal ligament lymphadenopathy. No retroperitoneal or mesenteric lymphadenopathy. No pelvic sidewall lymphadenopathy. Reproductive: The uterus is unremarkable.  There is no adnexal mass. Other: No intraperitoneal free fluid. Musculoskeletal: No worrisome lytic or sclerotic osseous abnormality. IMPRESSION: 1. No acute findings in the abdomen or pelvis. Specifically, no findings to explain the patient's history of abdominal pain. 2. Hepatic steatosis. 3. Status post right hemicolectomy. 4.  Aortic Atherosclerosis (ICD10-I70.0). Electronically Signed   By: EMisty StanleyM.D.   On: 01/28/2022 10:32   DG Chest Port 1 View  Result Date: 01/28/2022 CLINICAL DATA:  Questionable sepsis.  Evaluate for abnormality. EXAM: PORTABLE CHEST 1 VIEW COMPARISON:  12/09/2021 FINDINGS: Mild cardiac enlargement, unchanged. No signs of pleural effusion or edema. No airspace opacities. Visualized osseous structures are unremarkable. IMPRESSION: Mild cardiac enlargement.  No acute findings.  Electronically Signed   By: TKerby MoorsM.D.   On: 01/28/2022 09:06    Procedures Procedures    Medications Ordered in ED Medications  lactated ringers bolus 1,000 mL (0 mLs Intravenous Stopped 01/28/22 1134)  fentaNYL (SUBLIMAZE) injection 25 mcg (25 mcg Intravenous Given 01/28/22 0941)  metoCLOPramide (REGLAN) injection 5 mg (5 mg Intravenous Given 01/28/22 0941)  iohexol (OMNIPAQUE) 300 MG/ML solution 80 mL (80  mLs Intravenous Contrast Given 01/28/22 1013)  potassium chloride (KLOR-CON) packet 40 mEq (40 mEq Oral Given 01/28/22 1043)  alum & mag hydroxide-simeth (MAALOX/MYLANTA) 200-200-20 MG/5ML suspension 30 mL (30 mLs Oral Given 01/28/22 1043)    And  lidocaine (XYLOCAINE) 2 % viscous mouth solution 15 mL (15 mLs Oral Given 01/28/22 1043)  famotidine (PEPCID) IVPB 20 mg premix (0 mg Intravenous Stopped 01/28/22 1115)  cefTRIAXone (ROCEPHIN) 1 g in sodium chloride 0.9 % 100 mL IVPB (0 g Intravenous Stopped 01/28/22 1207)  metoprolol tartrate (LOPRESSOR) tablet 25 mg (25 mg Oral Given 01/28/22 1134)  metoprolol tartrate (LOPRESSOR) injection 5 mg (5 mg Intravenous Given 01/28/22 1136)    ED Course/ Medical Decision Making/ A&P                           Medical Decision Making Amount and/or Complexity of Data Reviewed Labs: ordered. Radiology: ordered. ECG/medicine tests: ordered.  Risk OTC drugs. Prescription drug management. Decision regarding hospitalization.   This patient presents to the ED for concern of generalized weakness, this involves an extensive number of treatment options, and is a complaint that carries with it a high risk of complications and morbidity.  The differential diagnosis includes infection, dehydration, arrhythmia, polypharmacy, seizure, TIA, anemia   Co morbidities that complicate the patient evaluation  HTN, colon cancer, CVA, HLD, atrial fibrillation, GERD, anemia   Additional history obtained:  Additional history obtained from  patient's family External records from outside source obtained and reviewed including EMR   Lab Tests:  I Ordered, and personally interpreted labs.  The pertinent results include: UA shows evidence of UTI.  Tetanus baseline.  Hypokalemia is present with otherwise normal electrolytes.  Troponin is normal.  Imaging Studies ordered:  I ordered imaging studies including chest x-ray, CT of abdomen pelvis I independently visualized and interpreted imaging which showed no acute findings I agree with the radiologist interpretation   Cardiac Monitoring: / EKG:  The patient was maintained on a cardiac monitor.  I personally viewed and interpreted the cardiac monitored which showed an underlying rhythm of: Atrial fibrillation  Problem List / ED Course / Critical interventions / Medication management  Patient presents for generalized weakness, fatigue, and near syncope.  EMS twelve-lead shows normal rate and irregular rhythm.   She does have a history of atrial fibrillation which was diagnosed last month.  Patient was hypotensive with EMS.  On arrival, blood pressure has normalized.  Broad diagnostic work-up was initiated.  Patient was kept on bedside cardiac monitor.  Family arrived at bedside.  Additional history is provided.  Patient reportedly had abdominal discomfort overnight.  While in the ED, patient had recurrence of epigastric discomfort and had 1 episode of emesis.  Fentanyl and Reglan were ordered.  CT scan of abdomen pelvis was ordered.  Lab work is notable for hypokalemia.  Replacement potassium was ordered.  Urinalysis shows evidence of UTI.  Urine cultures were sent and patient was treated empirically with ceftriaxone.  While in the ED, she did develop tachycardia.  This was secondary to atrial fibrillation with RVR.  At home, patient does take metoprolol and did not take any today.  Metoprolol was given in the ED with improvement of heart rate to the 110s.  Throughout this time, she  remained normotensive.  CT of abdomen pelvis did not show any acute findings to explain her recent GI upset or her generalized weakness.  Generalized weakness does continue.  Due to  her persistent symptoms, patient to be admitted to hospitalist for further management. I ordered medication including IV fluids for hydration; fentanyl for analgesia; potassium chloride for hypokalemia; GI cocktail for epigastric discomfort; Reglan for nausea; metoprolol for atrial fibrillation with RVR; ceftriaxone for UTI Reevaluation of the patient after these medicines showed that the patient improved I have reviewed the patients home medicines and have made adjustments as needed   Social Determinants of Health:  Lives at home with family         Final Clinical Impression(s) / ED Diagnoses Final diagnoses:  Generalized weakness  Acute cystitis without hematuria  Atrial fibrillation with RVR ([place])    Rx / DC Orders ED Discharge Orders     None         Godfrey Pick, MD 01/28/22 1524

## 2022-01-28 NOTE — H&P (Signed)
History and Physical    Janice Curtis BUL:[id number] DOB: 1940-07-19 DOA: 01/28/2022  PCP: [place], [place]  Patient coming from: home  I have personally briefly reviewed patient's old medical records in [place]  Chief Complaint: Syncope/presyncope  HPI: Janice Curtis is a 81 y.o. female with medical history significant of atrial fibrillation on anticoagulation, hypertension, hyperlipidemia, recent stroke, sent home with her daughter.  Family reports that for the past few days, she has complained of some abdominal discomfort and nausea.  P.o. intake has been poor.  She reports eating out for Thanksgiving.  It is possible that her GI symptoms started after that.  She does describe dysuria for the past few days.  While sitting at the kitchen table today, family notes that she became lethargic, less responsive and she began to slide out of her chair.  Records indicate that she did not ever fully pass out.  Patient reports that during this episode, she did experience palpitations and chest pressure.  EMS was called and she was noted to be hypotensive.  She was brought to the ER for evaluation where she had continued nausea and had an episode of vomiting.  Labs show that she has hypokalemia.  Troponin were unremarkable.  EKG shows rapid atrial fibrillation.  Blood pressure has been variable.  She did receive IV fluids in the emergency room.  Urinalysis indicated possible infection and she received ceftriaxone.  Chest x-ray was unremarkable.  CT abdomen pelvis did not show any acute findings.  Patient was referred for admission.   Review of Systems: As per HPI otherwise 10 point review of systems negative.    Past Medical History:  Diagnosis Date   Anxiety    Colon cancer ([place])    Hypercholesterolemia    Hypertension    Obesity    Stroke ([place]) 12/05/2009    Past Surgical History:  Procedure Laterality Date   COLONOSCOPY  12/27/08   simple adenoma/inflammatory  polyp at anastomosis   COLONOSCOPY  04/23/2011   Procedure: COLONOSCOPY;  Surgeon: Dorothyann Peng, MD;  Location: [facility];  Service: Endoscopy;  Laterality: N/A;  10:00   IR CT HEAD LTD  12/09/2021   IR PERCUTANEOUS ART THROMBECTOMY/INFUSION INTRACRANIAL INC DIAG ANGIO  12/09/2021   RADIOLOGY WITH ANESTHESIA N/A 12/09/2021   Procedure: RADIOLOGY WITH ANESTHESIA;  Surgeon: Radiologist, Medication, MD;  Location: [facility];  Service: Radiology;  Laterality: N/A;   RIGHT COLECTOMY  11/14/2007    Social History:  reports that she has never smoked. She has never used smokeless tobacco. She reports that she does not currently use alcohol. She reports that she does not use drugs.  No Known Allergies  Family History  Problem Relation Age of Onset   Heart attack Mother    Stroke Father     Prior to Admission medications   Medication Sig Start Date End Date Taking? Authorizing Provider  amLODipine (NORVASC) 5 MG tablet Take 1 tablet (5 mg total) by mouth daily. Patient taking differently: Take 5 mg by mouth in the morning. 01/03/22  Yes Gerlene Fee, NP  apixaban (ELIQUIS) 5 MG TABS tablet Take 1 tablet (5 mg total) by mouth 2 (two) times daily. 01/03/22  Yes Gerlene Fee, NP  metoprolol tartrate (LOPRESSOR) 25 MG tablet Take 0.5 tablets (12.5 mg total) by mouth 2 (two) times daily. 01/03/22  Yes Gerlene Fee, NP  pantoprazole (PROTONIX) 40 MG tablet Take 1 tablet (40 mg total) by mouth at  bedtime. 01/03/22  Yes Gerlene Fee, NP  potassium chloride SA (KLOR-CON M) 20 MEQ tablet Take 2 tablets (40 mEq total) by mouth daily. Patient taking differently: Take 40 mEq by mouth in the morning. 01/03/22  Yes Gerlene Fee, NP  rosuvastatin (CRESTOR) 40 MG tablet Place 1 tablet (40 mg total) into feeding tube daily. Patient taking differently: Take 40 mg by mouth daily. 01/03/22  Yes Gerlene Fee, NP  diclofenac Sodium (VOLTAREN) 1 % GEL Apply 2 g topically in the morning and at  bedtime. Patient not taking: Reported on 01/19/2022 01/03/22   Gerlene Fee, NP    Physical Exam: Vitals:   01/28/22 1530 01/28/22 1600 01/28/22 1612 01/28/22 1630  BP: 137/78 (!) 156/80  123/75  Pulse: 88 (!) 54  (!) 108  Resp: 19 14  (!) 21  Temp:   97.6 F (36.4 C)   TempSrc:   Oral   SpO2: 98% 98%  99%  Weight:      Height:        Constitutional: NAD, calm, comfortable Eyes: PERRL, lids and conjunctivae normal ENMT: Mucous membranes are moist. Posterior pharynx clear of any exudate or lesions.Normal dentition.  Neck: normal, supple, no masses, no thyromegaly Respiratory: clear to auscultation bilaterally, no wheezing, no crackles. Normal respiratory effort. No accessory muscle use.  Cardiovascular: Irregular rate and rhythm, no murmurs / rubs / gallops. No extremity edema. 2+ pedal pulses. No carotid bruits.  Abdomen: no tenderness, no masses palpated. No hepatosplenomegaly. Bowel sounds positive.  Musculoskeletal: no clubbing / cyanosis. No joint deformity upper and lower extremities. Good ROM, no contractures. Normal muscle tone.  Skin: no rashes, lesions, ulcers. No induration Neurologic: CN 2-12 grossly intact. Sensation intact, DTR normal. Strength 5/5 in all 4.  Psychiatric: Normal judgment and insight. Alert and oriented x 3. Normal mood.    Labs on Admission: I have personally reviewed following labs and imaging studies  CBC: Recent Labs  Lab 01/28/22 0912 01/28/22 0913  WBC  --  2.9*  NEUTROABS  --  1.4*  HGB 14.6 13.2  HCT 43.0 40.4  MCV  --  79.7*  PLT  --  458   Basic Metabolic Panel: Recent Labs  Lab 01/28/22 0912 01/28/22 0913  NA 137 135  K 3.2* 3.1*  CL 102 103  CO2  --  21*  GLUCOSE 142* 144*  BUN 6* 9  CREATININE 1.10* 1.13*  CALCIUM  --  9.6  MG  --  1.7   GFR: Estimated Creatinine Clearance: 38.5 mL/min (A) (by C-G formula based on SCr of 1.13 mg/dL (H)). Liver Function Tests: Recent Labs  Lab 01/28/22 0913  AST 33  ALT 20   ALKPHOS 66  BILITOT 0.8  PROT 7.2  ALBUMIN 3.8   Recent Labs  Lab 01/28/22 0913  LIPASE 38   No results for input(s): "AMMONIA" in the last 168 hours. Coagulation Profile: Recent Labs  Lab 01/28/22 0913  INR 1.1   Cardiac Enzymes: No results for input(s): "CKTOTAL", "CKMB", "CKMBINDEX", "TROPONINI" in the last 168 hours. BNP (last 3 results) No results for input(s): "PROBNP" in the last 8760 hours. HbA1C: No results for input(s): "HGBA1C" in the last 72 hours. CBG: No results for input(s): "GLUCAP" in the last 168 hours. Lipid Profile: No results for input(s): "CHOL", "HDL", "LDLCALC", "TRIG", "CHOLHDL", "LDLDIRECT" in the last 72 hours. Thyroid Function Tests: No results for input(s): "TSH", "T4TOTAL", "FREET4", "T3FREE", "THYROIDAB" in the last 72 hours. Anemia Panel: No  results for input(s): "VITAMINB12", "FOLATE", "FERRITIN", "TIBC", "IRON", "RETICCTPCT" in the last 72 hours. Urine analysis:    Component Value Date/Time   COLORURINE STRAW (A) 01/28/2022 1029   APPEARANCEUR CLEAR 01/28/2022 1029   LABSPEC 1.009 01/28/2022 1029   PHURINE 7.0 01/28/2022 1029   GLUCOSEU NEGATIVE 01/28/2022 1029   HGBUR MODERATE (A) 01/28/2022 1029   BILIRUBINUR NEGATIVE 01/28/2022 1029   KETONESUR NEGATIVE 01/28/2022 1029   PROTEINUR 30 (A) 01/28/2022 1029   UROBILINOGEN 0.2 11/11/2011 1026   NITRITE NEGATIVE 01/28/2022 1029   LEUKOCYTESUR TRACE (A) 01/28/2022 1029    Radiological Exams on Admission: CT ABDOMEN PELVIS W CONTRAST  Result Date: 01/28/2022 CLINICAL DATA:  Abdominal pain. EXAM: CT ABDOMEN AND PELVIS WITH CONTRAST TECHNIQUE: Multidetector CT imaging of the abdomen and pelvis was performed using the standard protocol following bolus administration of intravenous contrast. RADIATION DOSE REDUCTION: This exam was performed according to the departmental dose-optimization program which includes automated exposure control, adjustment of the mA and/or kV according to patient  size and/or use of iterative reconstruction technique. CONTRAST:  74m OMNIPAQUE IOHEXOL 300 MG/ML  SOLN COMPARISON:  01/23/2013 FINDINGS: Lower chest: Unremarkable. Hepatobiliary: The liver shows diffusely decreased attenuation suggesting fat deposition. No suspicious focal abnormality within the liver parenchyma. 8 mm hypodensity in the lateral segment left liver is stable consistent with benign etiology. No followup imaging is recommended. Small area of low attenuation in the anterior liver, adjacent to the falciform ligament, is in a characteristic location for focal fatty deposition. There is no evidence for gallstones, gallbladder wall thickening, or pericholecystic fluid. No intrahepatic or extrahepatic biliary dilation. Pancreas: No focal mass lesion. No dilatation of the main duct. No intraparenchymal cyst. No peripancreatic edema. Spleen: No splenomegaly. Tiny rim calcified cyst in the spleen is stable since prior study consistent with benign process. No followup imaging is recommended. Adrenals/Urinary Tract: No adrenal nodule or mass. Cortical scarring noted both kidneys. No suspicious renal mass lesion. No evidence for hydroureter. The urinary bladder appears normal for the degree of distention. Stomach/Bowel: Stomach is unremarkable. No gastric wall thickening. No evidence of outlet obstruction. Duodenum is normally positioned as is the ligament of Treitz. No small bowel wall thickening. No small bowel dilatation. Status post right hemicolectomy No gross colonic mass. No colonic wall thickening. Vascular/Lymphatic: There is advanced atherosclerotic calcification of the abdominal aorta without aneurysm. There is no gastrohepatic or hepatoduodenal ligament lymphadenopathy. No retroperitoneal or mesenteric lymphadenopathy. No pelvic sidewall lymphadenopathy. Reproductive: The uterus is unremarkable.  There is no adnexal mass. Other: No intraperitoneal free fluid. Musculoskeletal: No worrisome lytic or  sclerotic osseous abnormality. IMPRESSION: 1. No acute findings in the abdomen or pelvis. Specifically, no findings to explain the patient's history of abdominal pain. 2. Hepatic steatosis. 3. Status post right hemicolectomy. 4.  Aortic Atherosclerosis (ICD10-I70.0). Electronically Signed   By: EMisty StanleyM.D.   On: 01/28/2022 10:32   DG Chest Port 1 View  Result Date: 01/28/2022 CLINICAL DATA:  Questionable sepsis.  Evaluate for abnormality. EXAM: PORTABLE CHEST 1 VIEW COMPARISON:  12/09/2021 FINDINGS: Mild cardiac enlargement, unchanged. No signs of pleural effusion or edema. No airspace opacities. Visualized osseous structures are unremarkable. IMPRESSION: Mild cardiac enlargement.  No acute findings. Electronically Signed   By: TKerby MoorsM.D.   On: 01/28/2022 09:06    EKG: Independently reviewed.  Atrial fibrillation  Assessment/Plan Principal Problem:   Persistent atrial fibrillation (HCC) Active Problems:   Essential hypertension   Hyperlipidemia   GERD without esophagitis  Hypokalemia   Hypotension   UTI (urinary tract infection)   Nausea and vomiting     Presyncope -Noted to be hypotensive on EMS arrival -Unclear if she was having rapid atrial fibrillation at that time -She may also be somewhat hypovolemic due to poor p.o. intake for the past several days -She has recently had echocardiogram that showed preserved ejection fraction -Cardiac enzymes are normal -She is anticoagulated with Eliquis making PE unlikely -Continue to monitor on telemetry, IV hydration and check orthostatics -Check CT head  Possible urinary tract infection -Urinalysis indicated possible infection as she does describe some dysuria -Continue on ceftriaxone -Follow-up culture  Persistent atrial fibrillation -Recently saw cardiology on 11/17 and was noted to be in atrial fibrillation at that time -She is continued on metoprolol for rate control -Continue to monitor on telemetry -Continue  anticoagulation with Eliquis  Hypokalemia -Replace -Magnesium is 1.7 will also be replaced  Nausea and vomiting -Unclear if she has developed some degree of gastroenteritis recently eating out at a restaurant or if nausea and vomiting is related to urinary tract infection -Treat supportively with antiemetics -CT abdomen unrevealing -Clear liquids and advance as tolerated  GERD -Continue on PPI  Hyperlipidemia -Continue statin  Generalized weakness -PT/OT  DVT prophylaxis: Apixaban Code Status: Full code Family Communication: Updated patient's son who is her power of attorney Disposition Plan: Discharge home once improved Consults called:   Admission status: Observation, telemetry  Kathie Dike MD [facility]   If 7PM-7AM, please contact night-coverage [email or web address]   01/28/2022, 5:27 PM

## 2022-01-28 NOTE — ED Triage Notes (Signed)
Pt BIBA from home. Pt states she went unconscious during breakfast with husband. On EMS arrival, pt was alert.   BP was 82/64 on EMS arrival. HR irregular.  BP improved with missed IV attempt

## 2022-01-29 DIAGNOSIS — E782 Mixed hyperlipidemia: Secondary | ICD-10-CM | POA: Diagnosis not present

## 2022-01-29 DIAGNOSIS — I10 Essential (primary) hypertension: Secondary | ICD-10-CM | POA: Diagnosis not present

## 2022-01-29 DIAGNOSIS — K219 Gastro-esophageal reflux disease without esophagitis: Secondary | ICD-10-CM | POA: Diagnosis not present

## 2022-01-29 DIAGNOSIS — I4819 Other persistent atrial fibrillation: Secondary | ICD-10-CM | POA: Diagnosis not present

## 2022-01-29 LAB — CBC
HCT: 40.2 % (ref 36.0–46.0)
Hemoglobin: 13.1 g/dL (ref 12.0–15.0)
MCH: 26.2 pg (ref 26.0–34.0)
MCHC: 32.6 g/dL (ref 30.0–36.0)
MCV: 80.4 fL (ref 80.0–100.0)
Platelets: 172 10*3/uL (ref 150–400)
RBC: 5 MIL/uL (ref 3.87–5.11)
RDW: 15.8 % — ABNORMAL HIGH (ref 11.5–15.5)
WBC: 3.5 10*3/uL — ABNORMAL LOW (ref 4.0–10.5)
nRBC: 0 % (ref 0.0–0.2)

## 2022-01-29 LAB — TSH: TSH: 2.169 u[IU]/mL (ref 0.350–4.500)

## 2022-01-29 LAB — COMPREHENSIVE METABOLIC PANEL
ALT: 21 U/L (ref 0–44)
AST: 31 U/L (ref 15–41)
Albumin: 3.6 g/dL (ref 3.5–5.0)
Alkaline Phosphatase: 65 U/L (ref 38–126)
Anion gap: 7 (ref 5–15)
BUN: 7 mg/dL — ABNORMAL LOW (ref 8–23)
CO2: 21 mmol/L — ABNORMAL LOW (ref 22–32)
Calcium: 8.9 mg/dL (ref 8.9–10.3)
Chloride: 106 mmol/L (ref 98–111)
Creatinine, Ser: 0.96 mg/dL (ref 0.44–1.00)
GFR, Estimated: 60 mL/min — ABNORMAL LOW (ref >60)
Glucose, Bld: 100 mg/dL — ABNORMAL HIGH (ref 70–99)
Potassium: 4.2 mmol/L (ref 3.5–5.1)
Sodium: 134 mmol/L — ABNORMAL LOW (ref 135–145)
Total Bilirubin: 0.8 mg/dL (ref 0.3–1.2)
Total Protein: 6.7 g/dL (ref 6.5–8.1)

## 2022-01-29 MED ORDER — METOPROLOL TARTRATE 25 MG PO TABS: 25.0000 mg | ORAL_TABLET | Freq: Two times a day (BID) | ORAL | 0 refills | Status: DC

## 2022-01-29 MED ORDER — CEFADROXIL 500 MG PO CAPS: 1000.0000 mg | ORAL_CAPSULE | Freq: Two times a day (BID) | ORAL | 0 refills | Status: AC

## 2022-01-29 MED ORDER — METOPROLOL TARTRATE 5 MG/5ML IV SOLN
5.0000 mg | Freq: Once | INTRAVENOUS | Status: AC
  Administered 2022-01-29: 5 mg via INTRAVENOUS
  Filled 2022-01-29: qty 5

## 2022-01-29 MED ORDER — METOPROLOL TARTRATE 25 MG PO TABS
25.0000 mg | ORAL_TABLET | Freq: Two times a day (BID) | ORAL | Status: DC
  Administered 2022-01-29: 25 mg via ORAL
  Filled 2022-01-29: qty 1

## 2022-01-29 NOTE — Progress Notes (Signed)
Ortho Bps completed this am.  Pt able to sit, stand, transfer to [facility] and assist in bathing without difficulty.  Pt BP increased significantly with sitting and standing, checked again, verified cuff placement.  Reviewed with provider, orders received for '5mg'$  IV metoprolol.  Pt asymptomatic at this time, denies headache, blurred vision.  Pt BP back WNL after use of metoprolol, HR has been improved to rate of 80s-90s on monitor.

## 2022-01-29 NOTE — Care Management Obs Status (Signed)
[place] NOTIFICATION   Patient Details  Name: Janice Curtis MRN: [id number] Date of Birth: [birth year]   Medicare Observation Status Notification Given:  Yes (copy will be mailed to address on file)    Tommy Medal 01/29/2022, 3:43 PM

## 2022-01-29 NOTE — Progress Notes (Signed)
  Transition of Care [facility]) Screening Note   Patient Details  Name: Janice Curtis Date of Birth: 11-26-1940   Transition of Care [facility]) CM/SW Contact:    Ihor Gully, LCSW Phone Number: 01/29/2022, 9:24 AM    Transition of Care Department [facility]) has reviewed patient and no TOC needs have been identified at this time. We will continue to monitor patient advancement through interdisciplinary progression rounds. If new patient transition needs arise, please place a TOC consult.

## 2022-01-29 NOTE — Plan of Care (Signed)
  Problem: Acute Rehab PT Goals(only PT should resolve) Goal: Pt Will Go Supine/Side To Sit Outcome: Progressing Flowsheets (Taken 01/29/2022 1150) Pt will go Supine/Side to Sit:  Independently  with modified independence Goal: Patient Will Transfer Sit To/From Stand Outcome: Progressing Flowsheets (Taken 01/29/2022 1150) Patient will transfer sit to/from stand:  Independently  with modified independence Goal: Pt Will Transfer Bed To Chair/Chair To Bed Outcome: Progressing Flowsheets (Taken 01/29/2022 1150) Pt will Transfer Bed to Chair/Chair to Bed:  Independently  with modified independence Goal: Pt Will Ambulate Outcome: Progressing Flowsheets (Taken 01/29/2022 1150) Pt will Ambulate:  > 125 feet  with modified independence  with supervision  with least restrictive assistive device   11:51 AM, 01/29/22 Lonell Grandchild, MPT Physical Therapist with [facility] [phone] office [phone] mobile phone

## 2022-01-29 NOTE — Progress Notes (Signed)
Bps stable this shift.  Pt AFIB on monitor with rates 90s-110s.  No chest pain or SHOB.  Pt fatigued, reports intermittent sleeping patterns recently.  New IV access after IV infiltrate, cool compress applied and tylenol given for pt comfort.  Purewick in place.  Pt A/O x 4.

## 2022-01-29 NOTE — Evaluation (Signed)
Physical Therapy Evaluation Patient Details Name: Janice Curtis MRN: [id number] DOB: Jul 08, 1940 Today's Date: 01/29/2022  History of Present Illness  81 y.o. F admitted on 01/28/22 due to abdominal discomfort and nausea, along with poor PO intake since Thanksgiving. Pt had an episode of becoming less responsive and sliding out of her chair before coming to [facility]. PMH significant for  atrial fibrillation on anticoagulation, hypertension, hyperlipidemia, recent stroke.   Clinical Impression  Patient functioning near baseline for functional mobility and gait demonstrating good return for bed mobility, transferring to/from chair, BSC and ambulated in hallway without use of an AD with slow slightly labored cadence without loss of balance.  Patient tolerated sitting up in chair after therapy.  Patient will benefit from continued skilled physical therapy in hospital and recommended venue below to increase strength, balance, endurance for safe ADLs and gait.          Recommendations for follow up therapy are one component of a multi-disciplinary discharge planning process, led by the attending physician.  Recommendations may be updated based on patient status, additional functional criteria and insurance authorization.  Follow Up Recommendations Home health PT      Assistance Recommended at Discharge Set up Supervision/Assistance  Patient can return home with the following  A little help with walking and/or transfers;A little help with bathing/dressing/bathroom;Help with stairs or ramp for entrance;Assistance with cooking/housework    Equipment Recommendations None recommended by PT  Recommendations for Other Services       Functional Status Assessment Patient has had a recent decline in their functional status and demonstrates the ability to make significant improvements in function in a reasonable and predictable amount of time.     Precautions / Restrictions Precautions Precautions:  Fall Restrictions Weight Bearing Restrictions: No      Mobility  Bed Mobility Overal bed mobility: Modified Independent                  Transfers Overall transfer level: Needs assistance Equipment used: None Transfers: Sit to/from Stand, Bed to chair/wheelchair/BSC Sit to Stand: Min guard   Step pivot transfers: Min guard       General transfer comment: slightly labored movement, increased time, demonstrates fair/good return for transferring to/from [facility]    Ambulation/Gait Ambulation/Gait assistance: Supervision, Min guard Gait Distance (Feet): 80 Feet Assistive device: None Gait Pattern/deviations: Decreased step length - right, Decreased step length - left, Decreased stride length Gait velocity: decreased     General Gait Details: slightly labored cadence without use of AD, increased time for making turns, no loss of balance, limited mostly due to fatigue  Stairs            Wheelchair Mobility    Modified Rankin (Stroke Patients Only)       Balance Overall balance assessment: Needs assistance Sitting-balance support: Feet supported, No upper extremity supported Sitting balance-Leahy Scale: Good Sitting balance - Comments: seated at EOB   Standing balance support: During functional activity, No upper extremity supported Standing balance-Leahy Scale: Fair Standing balance comment: without AD                             Pertinent Vitals/Pain Pain Assessment Pain Assessment: No/denies pain    Home Living Family/patient expects to be discharged to:: Private residence Living Arrangements: Children Available Help at Discharge: Family;Available PRN/intermittently Type of Home: House Home Access: Level entry       Home Layout: One level Home Equipment:  Rolling Walker (2 wheels);Grab bars - tub/shower      Prior Function Prior Level of Function : Independent/Modified Independent             Mobility Comments: household  ambulator without AD, "per patient" ADLs Comments: assisted by family     Hand Dominance   Dominant Hand: Right    Extremity/Trunk Assessment   Upper Extremity Assessment Upper Extremity Assessment: Defer to OT evaluation    Lower Extremity Assessment Lower Extremity Assessment: Generalized weakness    Cervical / Trunk Assessment Cervical / Trunk Assessment: Kyphotic  Communication   Communication: No difficulties  Cognition Arousal/Alertness: Awake/alert Behavior During Therapy: WFL for tasks assessed/performed Overall Cognitive Status: No family/caregiver present to determine baseline cognitive functioning                                          General Comments General comments (skin integrity, edema, etc.): VSS on RA    Exercises     Assessment/Plan    PT Assessment Patient needs continued PT services  PT Problem List Decreased strength;Decreased activity tolerance;Decreased balance;Decreased mobility       PT Treatment Interventions DME instruction;Gait training;Stair training;Functional mobility training;Therapeutic activities;Therapeutic exercise;Balance training;Patient/family education    PT Goals (Current goals can be found in the Care Plan section)  Acute Rehab PT Goals Patient Stated Goal: return home with family to assist PT Goal Formulation: With patient Time For Goal Achievement: 01/31/22 Potential to Achieve Goals: Good    Frequency Min 2X/week     Co-evaluation PT/OT/SLP Co-Evaluation/Treatment: Yes Reason for Co-Treatment: To address functional/ADL transfers PT goals addressed during session: Mobility/safety with mobility;Balance         AM-PAC PT "6 Clicks" Mobility  Outcome Measure Help needed turning from your back to your side while in a flat bed without using bedrails?: None Help needed moving from lying on your back to sitting on the side of a flat bed without using bedrails?: None Help needed moving to and  from a bed to a chair (including a wheelchair)?: A Little Help needed standing up from a chair using your arms (e.g., wheelchair or bedside chair)?: None Help needed to walk in hospital room?: A Little Help needed climbing 3-5 steps with a railing? : A Little 6 Click Score: 21    End of Session   Activity Tolerance: Patient tolerated treatment well;Patient limited by fatigue Patient left: in chair;with call bell/phone within reach Nurse Communication: Mobility status PT Visit Diagnosis: Unsteadiness on feet (R26.81);Other abnormalities of gait and mobility (R26.89);Muscle weakness (generalized) (M62.81)    Time: 8182-9937 PT Time Calculation (min) (ACUTE ONLY): 13 min   Charges:   PT Evaluation $PT Eval Low Complexity: 1 Low PT Treatments $Therapeutic Activity: 8-22 mins        11:49 AM, 01/29/22 Lonell Grandchild, MPT Physical Therapist with [facility] [phone] office [phone] mobile phone

## 2022-01-29 NOTE — TOC Transition Note (Signed)
Transition of Care [facility]) - CM/SW Discharge Note   Patient Details  Name: SHARNISE BLOUGH MRN: [id number] Date of Birth: 11-Dec-1940  Transition of Care [facility]) CM/SW Contact:  Ihor Gully, LCSW Phone Number: 01/29/2022, 3:24 PM   Clinical Narrative:    Patient from home. Active with HH. Could not recall name of company. PT recommends HH. HH orders are in.    Final next level of care: [place] Barriers to Discharge: No Barriers Identified   Patient Goals and CMS Choice        Discharge Placement                       Discharge Plan and Services                                     Social Determinants of Health (SDOH) Interventions Housing Interventions: Intervention Not Indicated   Readmission Risk Interventions     No data to display

## 2022-01-29 NOTE — Discharge Summary (Signed)
Physician Discharge Summary  Janice Curtis:[id number] DOB: [birth year] DOA: 01/28/2022  PCP: [facility]  Admit date: 01/28/2022 Discharge date: 01/29/2022  Admitted From: Home Disposition: Home  Recommendations for Outpatient Follow-up:  Follow up with PCP in 1-2 weeks Please obtain BMP/CBC in one week Follow-up with cardiologist as scheduled  Home Health: Home health PT, OT Equipment/Devices:  Discharge Condition: Stable CODE STATUS: Full code Diet recommendation: Heart healthy  Brief/Interim Summary: Janice Curtis is a 81 y.o. female with medical history significant of atrial fibrillation on anticoagulation, hypertension, hyperlipidemia, recent stroke, sent home with her daughter.  Family reports that for the past few days, she has complained of some abdominal discomfort and nausea.  P.o. intake has been poor.  She reports eating out for Thanksgiving.  It is possible that her GI symptoms started after that.  She does describe dysuria for the past few days.  While sitting at the kitchen table today, family notes that she became lethargic, less responsive and she began to slide out of her chair.  Records indicate that she did not ever fully pass out.  Patient reports that during this episode, she did experience palpitations and chest pressure.  EMS was called and she was noted to be hypotensive.  She was brought to the ER for evaluation where she had continued nausea and had an episode of vomiting.  Labs show that she has hypokalemia.  Troponin were unremarkable.  EKG shows rapid atrial fibrillation.  Blood pressure has been variable.  She did receive IV fluids in the emergency room.  Urinalysis indicated possible infection and she received ceftriaxone.  Chest x-ray was unremarkable.  CT abdomen pelvis did not show any acute findings.  Patient was referred for admission.   Discharge Diagnoses:  Principal Problem:   Persistent atrial fibrillation (HCC) Active  Problems:   Essential hypertension   Hyperlipidemia   GERD without esophagitis   Hypokalemia   Hypotension   UTI (urinary tract infection)   Nausea and vomiting  Presyncope -Noted to be hypotensive on EMS arrival -Unclear if she was having rapid atrial fibrillation at that time -She may also be somewhat hypovolemic due to poor p.o. intake for the past several days -She has recently had echocardiogram that showed preserved ejection fraction -Cardiac enzymes are normal -She is anticoagulated with Eliquis making PE unlikely -Continue to monitor on telemetry, IV hydration and check orthostatics -CT head is unrevealing -Patient appears to have improved with IV fluids   Possible urinary tract infection -Urinalysis indicated possible infection as she does describe some dysuria -Patient started on ceftriaxone -Urine culture growing Klebsiella -Since she clinically improved with ceftriaxone, she was transitioned to cefadroxil   Persistent atrial fibrillation -Recently saw cardiology on 11/17 and was noted to be in atrial fibrillation at that time -She is continued on metoprolol for rate control -Increased dose from 12.5 mg twice daily to 25 mg twice daily since she was having occasional bursts of tachycardia -TSH 2.1 -Continue anticoagulation with Eliquis   Hypokalemia -Replaced -Magnesium is 1.7 and was also replaced   Nausea and vomiting -Unclear if she has developed some degree of gastroenteritis recently eating out at a restaurant or if nausea and vomiting is related to urinary tract infection -Treat supportively with antiemetics -CT abdomen unrevealing -Started on clear liquids and was tolerating advancing to solid foods   GERD -Continue on PPI   Hyperlipidemia -Continue statin   Generalized weakness -PT/OT with recommendations for home health  Discharge Instructions  Discharge Instructions     Diet - low sodium heart healthy   Complete by: As directed     Increase activity slowly   Complete by: As directed       Allergies as of 01/29/2022   No Known Allergies      Medication List     TAKE these medications    amLODipine 5 MG tablet Commonly known as: NORVASC Take 1 tablet (5 mg total) by mouth daily. What changed: when to take this   apixaban 5 MG Tabs tablet Commonly known as: ELIQUIS Take 1 tablet (5 mg total) by mouth 2 (two) times daily.   cefadroxil 500 MG capsule Commonly known as: DURICEF Take 2 capsules (1,000 mg total) by mouth 2 (two) times daily for 4 days.   diclofenac Sodium 1 % Gel Commonly known as: Voltaren Apply 2 g topically in the morning and at bedtime.   metoprolol tartrate 25 MG tablet Commonly known as: LOPRESSOR Take 1 tablet (25 mg total) by mouth 2 (two) times daily. What changed: how much to take   pantoprazole 40 MG tablet Commonly known as: PROTONIX Take 1 tablet (40 mg total) by mouth at bedtime.   potassium chloride SA 20 MEQ tablet Commonly known as: KLOR-CON M Take 2 tablets (40 mEq total) by mouth daily. What changed: when to take this   rosuvastatin 40 MG tablet Commonly known as: CRESTOR Place 1 tablet (40 mg total) into feeding tube daily. What changed: how to take this        No Known Allergies  Consultations:    Procedures/Studies: CT HEAD WO CONTRAST (5MM)  Result Date: 01/28/2022 CLINICAL DATA:  Loss of consciousness EXAM: CT HEAD WITHOUT CONTRAST TECHNIQUE: Contiguous axial images were obtained from the base of the skull through the vertex without intravenous contrast. RADIATION DOSE REDUCTION: This exam was performed according to the departmental dose-optimization program which includes automated exposure control, adjustment of the mA and/or kV according to patient size and/or use of iterative reconstruction technique. COMPARISON:  12/12/2021 CT, 12/10/2021 MRI FINDINGS: Brain: No evidence of acute infarction, hemorrhage, mass, mass effect, or midline shift. No  hydrocephalus or extra-axial fluid collection. Hypodensity in the left subinsular white matter, basal ganglia, and corona radiata is consistent with evolution of the acute infarct seen on the 12/10/2021 MRI and 12/12/2021 CT. Redemonstrated encephalomalacia in the left cerebellum and right occipital lobe, unchanged. Vascular: No hyperdense vessel. Atherosclerotic calcifications in the intracranial carotid and vertebral arteries. Skull: Normal. Negative for fracture or focal lesion. Sinuses/Orbits: No acute finding. Other: The mastoid air cells are well aerated. IMPRESSION: No acute intracranial process. Electronically Signed   By: Merilyn Baba M.D.   On: 01/28/2022 19:13   CT ABDOMEN PELVIS W CONTRAST  Result Date: 01/28/2022 CLINICAL DATA:  Abdominal pain. EXAM: CT ABDOMEN AND PELVIS WITH CONTRAST TECHNIQUE: Multidetector CT imaging of the abdomen and pelvis was performed using the standard protocol following bolus administration of intravenous contrast. RADIATION DOSE REDUCTION: This exam was performed according to the departmental dose-optimization program which includes automated exposure control, adjustment of the mA and/or kV according to patient size and/or use of iterative reconstruction technique. CONTRAST:  61m OMNIPAQUE IOHEXOL 300 MG/ML  SOLN COMPARISON:  01/23/2013 FINDINGS: Lower chest: Unremarkable. Hepatobiliary: The liver shows diffusely decreased attenuation suggesting fat deposition. No suspicious focal abnormality within the liver parenchyma. 8 mm hypodensity in the lateral segment left liver is stable consistent with benign etiology. No followup imaging is recommended. Small area of low  attenuation in the anterior liver, adjacent to the falciform ligament, is in a characteristic location for focal fatty deposition. There is no evidence for gallstones, gallbladder wall thickening, or pericholecystic fluid. No intrahepatic or extrahepatic biliary dilation. Pancreas: No focal mass lesion. No  dilatation of the main duct. No intraparenchymal cyst. No peripancreatic edema. Spleen: No splenomegaly. Tiny rim calcified cyst in the spleen is stable since prior study consistent with benign process. No followup imaging is recommended. Adrenals/Urinary Tract: No adrenal nodule or mass. Cortical scarring noted both kidneys. No suspicious renal mass lesion. No evidence for hydroureter. The urinary bladder appears normal for the degree of distention. Stomach/Bowel: Stomach is unremarkable. No gastric wall thickening. No evidence of outlet obstruction. Duodenum is normally positioned as is the ligament of Treitz. No small bowel wall thickening. No small bowel dilatation. Status post right hemicolectomy No gross colonic mass. No colonic wall thickening. Vascular/Lymphatic: There is advanced atherosclerotic calcification of the abdominal aorta without aneurysm. There is no gastrohepatic or hepatoduodenal ligament lymphadenopathy. No retroperitoneal or mesenteric lymphadenopathy. No pelvic sidewall lymphadenopathy. Reproductive: The uterus is unremarkable.  There is no adnexal mass. Other: No intraperitoneal free fluid. Musculoskeletal: No worrisome lytic or sclerotic osseous abnormality. IMPRESSION: 1. No acute findings in the abdomen or pelvis. Specifically, no findings to explain the patient's history of abdominal pain. 2. Hepatic steatosis. 3. Status post right hemicolectomy. 4.  Aortic Atherosclerosis (ICD10-I70.0). Electronically Signed   By: Misty Stanley M.D.   On: 01/28/2022 10:32   DG Chest Port 1 View  Result Date: 01/28/2022 CLINICAL DATA:  Questionable sepsis.  Evaluate for abnormality. EXAM: PORTABLE CHEST 1 VIEW COMPARISON:  12/09/2021 FINDINGS: Mild cardiac enlargement, unchanged. No signs of pleural effusion or edema. No airspace opacities. Visualized osseous structures are unremarkable. IMPRESSION: Mild cardiac enlargement.  No acute findings. Electronically Signed   By: Kerby Moors M.D.   On:  01/28/2022 09:06      Subjective: She says she is feeling better.  No longer is dizzy.  Says her p.o. intake is better.  Not having nausea.  Discharge Exam: Vitals:   01/29/22 0352 01/29/22 0445 01/29/22 1152 01/29/22 1353  BP: (!) 147/119 137/86 110/75 (!) 143/111  Pulse: 71 67 91 70  Resp:      Temp:  98.6 F (37 C) 98.2 F (36.8 C) 98 F (36.7 C)  TempSrc:  Oral Oral Oral  SpO2: 98%  99% 100%  Weight:      Height:        General: Pt is alert, awake, not in acute distress Cardiovascular: RRR, S1/S2 +, no rubs, no gallops Respiratory: CTA bilaterally, no wheezing, no rhonchi Abdominal: Soft, NT, ND, bowel sounds + Extremities: no edema, no cyanosis    The results of significant diagnostics from this hospitalization (including imaging, microbiology, ancillary and laboratory) are listed below for reference.     Microbiology: Recent Results (from the past 240 hour(s))  Resp Panel by RT-PCR (Flu A&B, Covid) Anterior Nasal Swab     Status: None   Collection Time: 01/28/22  8:32 AM   Specimen: Anterior Nasal Swab  Result Value Ref Range Status   SARS Coronavirus 2 by RT PCR NEGATIVE NEGATIVE Final    Comment: (NOTE) SARS-CoV-2 target nucleic acids are NOT DETECTED.  The SARS-CoV-2 RNA is generally detectable in upper respiratory specimens during the acute phase of infection. The lowest concentration of SARS-CoV-2 viral copies this assay can detect is 138 copies/mL. A negative result does not preclude SARS-Cov-2 infection and  should not be used as the sole basis for treatment or other patient management decisions. A negative result may occur with  improper specimen collection/handling, submission of specimen other than nasopharyngeal swab, presence of viral mutation(s) within the areas targeted by this assay, and inadequate number of viral copies(<138 copies/mL). A negative result must be combined with clinical observations, patient history, and  epidemiological information. The expected result is Negative.  Fact Sheet for Patients:  EntrepreneurPulse.com.au  Fact Sheet for Healthcare Providers:  IncredibleEmployment.be  This test is no t yet approved or cleared by the [place] FDA and  has been authorized for detection and/or diagnosis of SARS-CoV-2 by FDA under an Emergency Use Authorization (EUA). This EUA will remain  in effect (meaning this test can be used) for the duration of the COVID-19 declaration under Section 564(b)(1) of the Act, 21 U.S.C.section 360bbb-3(b)(1), unless the authorization is terminated  or revoked sooner.       Influenza A by PCR NEGATIVE NEGATIVE Final   Influenza B by PCR NEGATIVE NEGATIVE Final    Comment: (NOTE) The Xpert Xpress SARS-CoV-2/FLU/RSV plus assay is intended as an aid in the diagnosis of influenza from Nasopharyngeal swab specimens and should not be used as a sole basis for treatment. Nasal washings and aspirates are unacceptable for Xpert Xpress SARS-CoV-2/FLU/RSV testing.  Fact Sheet for Patients: EntrepreneurPulse.com.au  Fact Sheet for Healthcare Providers: IncredibleEmployment.be  This test is not yet approved or cleared by the [place] FDA and has been authorized for detection and/or diagnosis of SARS-CoV-2 by FDA under an Emergency Use Authorization (EUA). This EUA will remain in effect (meaning this test can be used) for the duration of the COVID-19 declaration under Section 564(b)(1) of the Act, 21 U.S.C. section 360bbb-3(b)(1), unless the authorization is terminated or revoked.  Performed at [facility], [address] [place], [place] [address]   Blood Culture (routine x 2)     Status: None (Preliminary result)   Collection Time: 01/28/22  9:13 AM   Specimen: BLOOD RIGHT ARM  Result Value Ref Range Status   Specimen Description   Final    BLOOD RIGHT ARM BOTTLES DRAWN AEROBIC  AND ANAEROBIC   Special Requests Blood Culture adequate volume  Final   Culture   Final    NO GROWTH < 24 HOURS Performed at [facility], [address], [place], [place] [address]    Report Status PENDING  Incomplete  Urine Culture     Status: Abnormal (Preliminary result)   Collection Time: 01/28/22 10:29 AM   Specimen: Urine, Catheterized  Result Value Ref Range Status   Specimen Description   Final    URINE, CATHETERIZED Performed at [facility], [address] [place], [place] [address]    Special Requests   Final    NONE Performed at [facility], [address], [place], [place] [address]    Culture (A)  Final    40,000 COLONIES/mL KLEBSIELLA PNEUMONIAE SUSCEPTIBILITIES TO FOLLOW Performed at [facility], [place] [address], [place],  [address]    Report Status PENDING  Incomplete  Blood Culture (routine x 2)     Status: None (Preliminary result)   Collection Time: 01/28/22 12:40 PM   Specimen: BLOOD LEFT ARM  Result Value Ref Range Status   Specimen Description BLOOD LEFT ARM BOTTLES DRAWN AEROBIC AND ANAEROBIC  Final   Special Requests Blood Culture adequate volume  Final   Culture   Final    NO GROWTH < 12 HOURS  Performed at [facility], [address], [place], [place] [address]    Report Status PENDING  Incomplete     Labs: BNP (last 3 results) No results for input(s): "BNP" in the last 8760 hours. Basic Metabolic Panel: Recent Labs  Lab 01/28/22 0912 01/28/22 0913 01/29/22 0620  NA 137 135 134*  K 3.2* 3.1* 4.2  CL 102 103 106  CO2  --  21* 21*  GLUCOSE 142* 144* 100*  BUN 6* 9 7*  CREATININE 1.10* 1.13* 0.96  CALCIUM  --  9.6 8.9  MG  --  1.7  --    Liver Function Tests: Recent Labs  Lab 01/28/22 0913 01/29/22 0620  AST 33 31  ALT 20 21  ALKPHOS 66 65  BILITOT 0.8 0.8  PROT 7.2 6.7  ALBUMIN 3.8 3.6   Recent Labs  Lab 01/28/22 0913  LIPASE 38   No results for input(s): "AMMONIA" in the last 168 hours. CBC: Recent Labs   Lab 01/28/22 0912 01/28/22 0913 01/29/22 0620  WBC  --  2.9* 3.5*  NEUTROABS  --  1.4*  --   HGB 14.6 13.2 13.1  HCT 43.0 40.4 40.2  MCV  --  79.7* 80.4  PLT  --  153 172   Cardiac Enzymes: No results for input(s): "CKTOTAL", "CKMB", "CKMBINDEX", "TROPONINI" in the last 168 hours. BNP: Invalid input(s): "POCBNP" CBG: No results for input(s): "GLUCAP" in the last 168 hours. D-Dimer No results for input(s): "DDIMER" in the last 72 hours. Hgb A1c No results for input(s): "HGBA1C" in the last 72 hours. Lipid Profile No results for input(s): "CHOL", "HDL", "LDLCALC", "TRIG", "CHOLHDL", "LDLDIRECT" in the last 72 hours. Thyroid function studies Recent Labs    01/29/22 0620  TSH 2.169   Anemia work up No results for input(s): "VITAMINB12", "FOLATE", "FERRITIN", "TIBC", "IRON", "RETICCTPCT" in the last 72 hours. Urinalysis    Component Value Date/Time   COLORURINE STRAW (A) 01/28/2022 1029   APPEARANCEUR CLEAR 01/28/2022 1029   LABSPEC 1.009 01/28/2022 1029   PHURINE 7.0 01/28/2022 1029   GLUCOSEU NEGATIVE 01/28/2022 1029   HGBUR MODERATE (A) 01/28/2022 1029   BILIRUBINUR NEGATIVE 01/28/2022 1029   KETONESUR NEGATIVE 01/28/2022 1029   PROTEINUR 30 (A) 01/28/2022 1029   UROBILINOGEN 0.2 11/11/2011 1026   NITRITE NEGATIVE 01/28/2022 1029   LEUKOCYTESUR TRACE (A) 01/28/2022 1029   Sepsis Labs Recent Labs  Lab 01/28/22 0913 01/29/22 0620  WBC 2.9* 3.5*   Microbiology Recent Results (from the past 240 hour(s))  Resp Panel by RT-PCR (Flu A&B, Covid) Anterior Nasal Swab     Status: None   Collection Time: 01/28/22  8:32 AM   Specimen: Anterior Nasal Swab  Result Value Ref Range Status   SARS Coronavirus 2 by RT PCR NEGATIVE NEGATIVE Final    Comment: (NOTE) SARS-CoV-2 target nucleic acids are NOT DETECTED.  The SARS-CoV-2 RNA is generally detectable in upper respiratory specimens during the acute phase of infection. The lowest concentration of SARS-CoV-2 viral  copies this assay can detect is 138 copies/mL. A negative result does not preclude SARS-Cov-2 infection and should not be used as the sole basis for treatment or other patient management decisions. A negative result may occur with  improper specimen collection/handling, submission of specimen other than nasopharyngeal swab, presence of viral mutation(s) within the areas targeted by this assay, and inadequate number of viral copies(<138 copies/mL). A negative result must be combined with clinical observations, patient history, and epidemiological information. The expected result is Negative.  Fact Sheet for Patients:  EntrepreneurPulse.com.au  Fact Sheet for Healthcare Providers:  IncredibleEmployment.be  This test is no t yet approved or cleared by the [place] FDA and  has been authorized for detection and/or diagnosis of SARS-CoV-2 by FDA under an Emergency Use Authorization (EUA). This EUA will remain  in effect (meaning this test can be used) for the duration of the COVID-19 declaration under Section 564(b)(1) of the Act, 21 U.S.C.section 360bbb-3(b)(1), unless the authorization is terminated  or revoked sooner.       Influenza A by PCR NEGATIVE NEGATIVE Final   Influenza B by PCR NEGATIVE NEGATIVE Final    Comment: (NOTE) The Xpert Xpress SARS-CoV-2/FLU/RSV plus assay is intended as an aid in the diagnosis of influenza from Nasopharyngeal swab specimens and should not be used as a sole basis for treatment. Nasal washings and aspirates are unacceptable for Xpert Xpress SARS-CoV-2/FLU/RSV testing.  Fact Sheet for Patients: EntrepreneurPulse.com.au  Fact Sheet for Healthcare Providers: IncredibleEmployment.be  This test is not yet approved or cleared by the [place] FDA and has been authorized for detection and/or diagnosis of SARS-CoV-2 by FDA under an Emergency Use Authorization (EUA). This  EUA will remain in effect (meaning this test can be used) for the duration of the COVID-19 declaration under Section 564(b)(1) of the Act, 21 U.S.C. section 360bbb-3(b)(1), unless the authorization is terminated or revoked.  Performed at [facility], [address] [place], [place] [address]   Blood Culture (routine x 2)     Status: None (Preliminary result)   Collection Time: 01/28/22  9:13 AM   Specimen: BLOOD RIGHT ARM  Result Value Ref Range Status   Specimen Description   Final    BLOOD RIGHT ARM BOTTLES DRAWN AEROBIC AND ANAEROBIC   Special Requests Blood Culture adequate volume  Final   Culture   Final    NO GROWTH < 24 HOURS Performed at [facility], [address] [place], [place] [address]    Report Status PENDING  Incomplete  Urine Culture     Status: Abnormal (Preliminary result)   Collection Time: 01/28/22 10:29 AM   Specimen: Urine, Catheterized  Result Value Ref Range Status   Specimen Description   Final    URINE, CATHETERIZED Performed at [facility], [address] [place], [place] [address]    Special Requests   Final    NONE Performed at [facility], [address], [place], [place] [address]    Culture (A)  Final    40,000 COLONIES/mL KLEBSIELLA PNEUMONIAE SUSCEPTIBILITIES TO FOLLOW Performed at [facility], [place] [address] [place], [place] [address]    Report Status PENDING  Incomplete  Blood Culture (routine x 2)     Status: None (Preliminary result)   Collection Time: 01/28/22 12:40 PM   Specimen: BLOOD LEFT ARM  Result Value Ref Range Status   Specimen Description BLOOD LEFT ARM BOTTLES DRAWN AEROBIC AND ANAEROBIC  Final   Special Requests Blood Culture adequate volume  Final   Culture   Final    NO GROWTH < 12 HOURS Performed at [facility], [address], [place], [place] [address]    Report Status PENDING  Incomplete     Time coordinating discharge: 34mns  SIGNED:   JKathie Dike MD  [facility] 01/29/2022,  8:20 PM   If 7PM-7AM, please contact night-coverage [email or web address]

## 2022-01-29 NOTE — Evaluation (Signed)
Occupational Therapy Evaluation Patient Details Name: Janice Curtis MRN: [id number] DOB: 1940-04-08 Today's Date: 01/29/2022   History of Present Illness 81 y.o. F admitted on 01/28/22 due to abdominal discomfort and nausea, along with poor PO intake since Thanksgiving. Pt had an episode of becoming less responsive and sliding out of her chair before coming to [facility]. PMH significant for  atrial fibrillation on anticoagulation, hypertension, hyperlipidemia, recent stroke.   Clinical Impression   Pt admitted for concerns listed above. PTA Pt reported that she was independent with all ADL's and IADL's, using a RW for community mobility. At this time, pt is requiring up to min A for ADL's and min guard with intermittent min A for functional mobility with no RW. Her balance and strength have decreased this hospital stay and OT recommending SNF level therapies unless family can provide increased assist at home, then pt would benefit from [facility] services. OT will continue to follow acutely.      Recommendations for follow up therapy are one component of a multi-disciplinary discharge planning process, led by the attending physician.  Recommendations may be updated based on patient status, additional functional criteria and insurance authorization.   Follow Up Recommendations  Skilled nursing-short term rehab (<3 hours/day)     Assistance Recommended at Discharge Intermittent Supervision/Assistance  Patient can return home with the following A little help with walking and/or transfers;A little help with bathing/dressing/bathroom;Assistance with cooking/housework;Direct supervision/assist for financial management;Direct supervision/assist for medications management;Help with stairs or ramp for entrance    Functional Status Assessment  Patient has had a recent decline in their functional status and demonstrates the ability to make significant improvements in function in a reasonable and predictable amount  of time.  Equipment Recommendations  Tub/shower seat    Recommendations for Other Services       Precautions / Restrictions Precautions Precautions: Fall Restrictions Weight Bearing Restrictions: No      Mobility Bed Mobility Overal bed mobility: Modified Independent             General bed mobility comments: Increased time and use of bed railings    Transfers Overall transfer level: Needs assistance Equipment used: None Transfers: Sit to/from Stand, Bed to chair/wheelchair/BSC Sit to Stand: Min guard     Step pivot transfers: Min guard     General transfer comment: Min guard for safety, along with increased time      Balance Overall balance assessment: Mild deficits observed, not formally tested                                         ADL either performed or assessed with clinical judgement   ADL Overall ADL's : Needs assistance/impaired Eating/Feeding: Set up;Sitting   Grooming: Set up;Sitting   Upper Body Bathing: Minimal assistance;Sitting   Lower Body Bathing: Sitting/lateral leans;Sit to/from stand;Minimal assistance   Upper Body Dressing : Set up;Sitting   Lower Body Dressing: Minimal assistance;Sitting/lateral leans;Sit to/from stand   Toilet Transfer: Minimal assistance;Ambulation   Toileting- Clothing Manipulation and Hygiene: Minimal assistance;Sit to/from stand;Sitting/lateral lean       Functional mobility during ADLs: Minimal assistance;Rolling walker (2 wheels)       Vision Baseline Vision/History: 1 Wears glasses Ability to See in Adequate Light: 0 Adequate Patient Visual Report: No change from baseline Vision Assessment?: No apparent visual deficits     Perception Perception Perception Tested?: No   Praxis  Praxis Praxis tested?: Not tested    Pertinent Vitals/Pain Pain Assessment Pain Assessment: No/denies pain     Hand Dominance Right   Extremity/Trunk Assessment Upper Extremity  Assessment Upper Extremity Assessment: Generalized weakness   Lower Extremity Assessment Lower Extremity Assessment: Defer to PT evaluation   Cervical / Trunk Assessment Cervical / Trunk Assessment: Kyphotic   Communication Communication Communication: No difficulties   Cognition Arousal/Alertness: Awake/alert Behavior During Therapy: WFL for tasks assessed/performed Overall Cognitive Status: No family/caregiver present to determine baseline cognitive functioning                                       General Comments  VSS on RA    Exercises     Shoulder Instructions      Home Living Family/patient expects to be discharged to:: Private residence Living Arrangements: Children Available Help at Discharge: Family;Available PRN/intermittently Type of Home: House Home Access: Level entry     Home Layout: One level     Bathroom Shower/Tub: Occupational psychologist: Handicapped height Bathroom Accessibility: Yes How Accessible: Accessible via walker Home Equipment: Rolling Walker (2 wheels);Grab bars - tub/shower          Prior Functioning/Environment Prior Level of Function : Independent/Modified Independent                        OT Problem List: Decreased strength;Decreased activity tolerance;Impaired balance (sitting and/or standing);Decreased safety awareness      OT Treatment/Interventions: Self-care/ADL training;Therapeutic exercise;Energy conservation;DME and/or AE instruction;Therapeutic activities;Patient/family education;Balance training    OT Goals(Current goals can be found in the care plan section) Acute Rehab OT Goals Patient Stated Goal: To go home OT Goal Formulation: With patient Time For Goal Achievement: 02/12/22 Potential to Achieve Goals: Good ADL Goals Pt Will Perform Grooming: with modified independence;standing Pt Will Perform Lower Body Bathing: with modified independence;sitting/lateral leans;sit  to/from stand Pt Will Perform Lower Body Dressing: with modified independence;sitting/lateral leans;sit to/from stand Pt Will Transfer to Toilet: with modified independence;ambulating Pt Will Perform Toileting - Clothing Manipulation and hygiene: with modified independence;sitting/lateral leans;sit to/from stand  OT Frequency: Min 1X/week    Co-evaluation              AM-PAC OT "6 Clicks" Daily Activity     Outcome Measure Help from another person eating meals?: A Little Help from another person taking care of personal grooming?: A Little Help from another person toileting, which includes using toliet, bedpan, or urinal?: A Little Help from another person bathing (including washing, rinsing, drying)?: A Little Help from another person to put on and taking off regular upper body clothing?: A Little Help from another person to put on and taking off regular lower body clothing?: A Little 6 Click Score: 18   End of Session Equipment Utilized During Treatment: Gait belt Nurse Communication: Mobility status  Activity Tolerance: Patient tolerated treatment well Patient left: in chair;with call bell/phone within reach  OT Visit Diagnosis: Unsteadiness on feet (R26.81);Other abnormalities of gait and mobility (R26.89);Muscle weakness (generalized) (M62.81)                Time: 4128-7867 OT Time Calculation (min): 17 min Charges:  OT General Charges $OT Visit: 1 Visit OT Evaluation $OT Eval Moderate Complexity: 1 Mod  Rowyn Mustapha Yolanda Bonine, OTR/L [place] 01/29/2022, 10:41 AM

## 2022-01-30 LAB — URINE CULTURE: Culture: 40000 — AB

## 2022-01-31 ENCOUNTER — Other Ambulatory Visit: Payer: Self-pay | Admitting: Adult Health

## 2022-02-02 DIAGNOSIS — I4891 Unspecified atrial fibrillation: Secondary | ICD-10-CM | POA: Diagnosis not present

## 2022-02-02 DIAGNOSIS — I69320 Aphasia following cerebral infarction: Secondary | ICD-10-CM | POA: Diagnosis not present

## 2022-02-02 DIAGNOSIS — I69351 Hemiplegia and hemiparesis following cerebral infarction affecting right dominant side: Secondary | ICD-10-CM | POA: Diagnosis not present

## 2022-02-02 DIAGNOSIS — I69391 Dysphagia following cerebral infarction: Secondary | ICD-10-CM | POA: Diagnosis not present

## 2022-02-02 LAB — CULTURE, BLOOD (ROUTINE X 2)
Culture: NO GROWTH
Culture: NO GROWTH
Special Requests: ADEQUATE
Special Requests: ADEQUATE

## 2022-02-12 ENCOUNTER — Inpatient Hospital Stay: Payer: Medicare HMO | Admitting: Neurology

## 2022-02-12 ENCOUNTER — Telehealth: Payer: Self-pay | Admitting: Neurology

## 2022-02-12 NOTE — Telephone Encounter (Signed)
LVM informing pt of need to reschedule 12/11 appointment - MD out

## 2022-02-13 ENCOUNTER — Ambulatory Visit: Payer: Medicare HMO | Admitting: Neurology

## 2022-02-13 ENCOUNTER — Other Ambulatory Visit: Payer: Self-pay | Admitting: Adult Health

## 2022-02-13 ENCOUNTER — Encounter: Payer: Self-pay | Admitting: Neurology

## 2022-02-13 VITALS — BP 131/83 | HR 72 | Ht 63.0 in | Wt 161.0 lb

## 2022-02-13 DIAGNOSIS — I63412 Cerebral infarction due to embolism of left middle cerebral artery: Secondary | ICD-10-CM

## 2022-02-13 DIAGNOSIS — I4891 Unspecified atrial fibrillation: Secondary | ICD-10-CM

## 2022-02-13 NOTE — Patient Instructions (Signed)
I had a long d/w patient and her son about her recent embolic stroke, mechanical thrombectomy and new onset atrial fibrillation,risk for recurrent stroke/TIAs, personally independently reviewed imaging studies and stroke evaluation results and answered questions.Continue Eliquis (apixaban) daily  for secondary stroke prevention and maintain strict control of hypertension with blood pressure goal below 130/90, diabetes with hemoglobin A1c goal below 6.5% and lipids with LDL cholesterol goal below 70 mg/dL. I also advised the patient to eat a healthy diet with plenty of whole grains, cereals, fruits and vegetables, exercise regularly and maintain ideal body weight.  She was encouraged to use a walker at all times and was discussed fall safety precautions.  Patient may also consider possible participation in the [place] [employer] stroke prevention study ( Eliquis versu Milvexian- factor 11 inhibitor )if interested.  She was given written information to take home and review and decide followup in the future with me in 6 months or call earlier if necessary.  Stroke Prevention Some medical conditions and behaviors can lead to a higher chance of having a stroke. You can help prevent a stroke by eating healthy, exercising, not smoking, and managing any medical conditions you have. Stroke is a leading cause of functional impairment. Primary prevention is particularly important because a majority of strokes are first-time events. Stroke changes the lives of not only those who experience a stroke but also their family and other caregivers. How can this condition affect me? A stroke is a medical emergency and should be treated right away. A stroke can lead to brain damage and can sometimes be life-threatening. If a person gets medical treatment right away, there is a better chance of surviving and recovering from a stroke. What can increase my risk? The following medical conditions may increase your risk of a  stroke: Cardiovascular disease. High blood pressure (hypertension). Diabetes. High cholesterol. Sickle cell disease. Blood clotting disorders (hypercoagulable state). Obesity. Sleep disorders (obstructive sleep apnea). Other risk factors include: Being older than age 81. Having a history of blood clots, stroke, or mini-stroke (transient ischemic attack, TIA). Genetic factors, such as race, ethnicity, or a family history of stroke. Smoking cigarettes or using other tobacco products. Taking birth control pills, especially if you also use tobacco. Heavy use of alcohol or drugs, especially cocaine and methamphetamine. Physical inactivity. What actions can I take to prevent this? Manage your health conditions High cholesterol levels. Eating a healthy diet is important for preventing high cholesterol. If cholesterol cannot be managed through diet alone, you may need to take medicines. Take any prescribed medicines to control your cholesterol as told by your health care provider. Hypertension. To reduce your risk of stroke, try to keep your blood pressure below 130/80. Eating a healthy diet and exercising regularly are important for controlling blood pressure. If these steps are not enough to manage your blood pressure, you may need to take medicines. Take any prescribed medicines to control hypertension as told by your health care provider. Ask your health care provider if you should monitor your blood pressure at home. Have your blood pressure checked every year, even if your blood pressure is normal. Blood pressure increases with age and some medical conditions. Diabetes. Eating a healthy diet and exercising regularly are important parts of managing your blood sugar (glucose). If your blood sugar cannot be managed through diet and exercise, you may need to take medicines. Take any prescribed medicines to control your diabetes as told by your health care provider. Get evaluated for  obstructive sleep  apnea. Talk to your health care provider about getting a sleep evaluation if you snore a lot or have excessive sleepiness. Make sure that any other medical conditions you have, such as atrial fibrillation or atherosclerosis, are managed. Nutrition Follow instructions from your health care provider about what to eat or drink to help manage your health condition. These instructions may include: Reducing your daily calorie intake. Limiting how much salt (sodium) you use to 1,500 milligrams (mg) each day. Using only healthy fats for cooking, such as olive oil, canola oil, or sunflower oil. Eating healthy foods. You can do this by: Choosing foods that are high in fiber, such as whole grains, and fresh fruits and vegetables. Eating at least 5 servings of fruits and vegetables a day. Try to fill one-half of your plate with fruits and vegetables at each meal. Choosing lean protein foods, such as lean cuts of meat, poultry without skin, fish, tofu, beans, and nuts. Eating low-fat dairy products. Avoiding foods that are high in sodium. This can help lower blood pressure. Avoiding foods that have saturated fat, trans fat, and cholesterol. This can help prevent high cholesterol. Avoiding processed and prepared foods. Counting your daily carbohydrate intake.  Lifestyle If you drink alcohol: Limit how much you have to: 0-1 drink a day for women who are not pregnant. 0-2 drinks a day for men. Know how much alcohol is in your drink. In the [place] one drink equals one 12 oz bottle of beer (339m), one 5 oz glass of wine (1445m, or one 1 oz glass of hard liquor (4429m Do not use any products that contain nicotine or tobacco. These products include cigarettes, chewing tobacco, and vaping devices, such as e-cigarettes. If you need help quitting, ask your health care provider. Avoid secondhand smoke. Do not use drugs. Activity  Try to stay at a healthy weight. Get at least 30 minutes of  exercise on most days, such as: Fast walking. Biking. Swimming. Medicines Take over-the-counter and prescription medicines only as told by your health care provider. Aspirin or blood thinners (antiplatelets or anticoagulants) may be recommended to reduce your risk of forming blood clots that can lead to stroke. Avoid taking birth control pills. Talk to your health care provider about the risks of taking birth control pills if: You are over 35 48ars old. You smoke. You get very bad headaches. You have had a blood clot. Where to find more information American Stroke Association: www.strokeassociation.org Get help right away if: You or a loved one has any symptoms of a stroke. "BE FAST" is an easy way to remember the main warning signs of a stroke: B - Balance. Signs are dizziness, sudden trouble walking, or loss of balance. E - Eyes. Signs are trouble seeing or a sudden change in vision. F - Face. Signs are sudden weakness or numbness of the face, or the face or eyelid drooping on one side. A - Arms. Signs are weakness or numbness in an arm. This happens suddenly and usually on one side of the body. S - Speech. Signs are sudden trouble speaking, slurred speech, or trouble understanding what people say. T - Time. Time to call emergency services. Write down what time symptoms started. You or a loved one has other signs of a stroke, such as: A sudden, severe headache with no known cause. Nausea or vomiting. Seizure. These symptoms may represent a serious problem that is an emergency. Do not wait to see if the symptoms will go away. Get medical help  right away. Call your local emergency services (911 in the [place] Do not drive yourself to the hospital. Summary You can help to prevent a stroke by eating healthy, exercising, not smoking, limiting alcohol intake, and managing any medical conditions you may have. Do not use any products that contain nicotine or tobacco. These include cigarettes,  chewing tobacco, and vaping devices, such as e-cigarettes. If you need help quitting, ask your health care provider. Remember "BE FAST" for warning signs of a stroke. Get help right away if you or a loved one has any of these signs. This information is not intended to replace advice given to you by your health care provider. Make sure you discuss any questions you have with your health care provider. Document Revised: 09/21/2019 Document Reviewed: 09/21/2019 Elsevier Patient Education  [place].

## 2022-02-13 NOTE — Progress Notes (Signed)
[facility] [address] [place]. [place] [address] [phone]       OFFICE FOLLOW-UP NOTE  Ms. PARNIKA TWETEN Date of Birth:  1940/07/31 Medical Record Number:  [id number]   HPI: Janice Curtis is a 81 year old pleasant African-American lady seen today for initial office follow-up visit following hospital admission for stroke in October 2023.  She is accompanied by her son.  History is obtained from them and review of electronic medical records.  I personally reviewed pertinent available imaging films in PACS.  She has past medical history of colon cancer, hyperlipidemia, hypertension, obesity remote stroke who presented to [facility] on 12/09/2021 via EMS as a transfer from [facility] emergency for sudden onset of aphasia with right hemiparesis as noted by her grandson following a fall at home.  Grandson ordered out tongue on the floor and emergency room found her to be nonverbal with right leg weakness.  EMS was called..  And she was brought in as a code stroke was called en route.  Patient was taken to [facility] and evaluated by teleneurology.  Administered IV TNK and emergent CT angiogram showed left MCA artery occlusion.  NIH Stroke scale was 33.  She was transferred emergently to [facility] and after discussion of risk benefits getting informed consent from the family she underwent successful mechanical thrombectomy with TICI 3 flow established in the left MCA.  She was admitted to the intensive care unit.  Blood pressure tightly controlled.  She was extubated.  She did well.  EKG showed atrial fibrillation which was new onset.  2D echo showed ejection fraction 60 to 65%.  LDL cholesterol 131 mg percent.  Hemoglobin A1c was 5.7.  Follow-up CT scan showed trace subarachnoid hemorrhage moderate left posterior insula and follow-up CT scan showed resolution.  Patient was started on Eliquis at discharge and went to rehabilitation in a skilled  nursing facility which she has done well.  He has been discharged home now and is presently getting home physical and Occupational Therapy.  She is able to ambulate with a walker.  She has had no falls or injuries.  Her speech is improved significantly though she has occasional word finding difficulties and word hesitancy.  She also feels tired and has less energy.  She is living at home with her grandson and daughter.  Patient is tolerating Eliquis well without bruising or bleeding but is complaining about high co-pay.  Her blood pressure is under good control and today it is 131/83.  She is tolerating Crestor well without muscle aches and pains.  ROS:   14 system review of systems is positive for speech difficulties, walking difficulty, imbalance, bruising and all other systems negative  PMH:  Past Medical History:  Diagnosis Date   Anxiety    Colon cancer ([place])    Hypercholesterolemia    Hypertension    Obesity    Stroke ([place]) 12/05/2009    Social History:  Social History   Socioeconomic History   Marital status: Divorced    Spouse name: Not on file   Number of children: Not on file   Years of education: Not on file   Highest education level: Not on file  Occupational History   Not on file  Tobacco Use   Smoking status: Never   Smokeless tobacco: Never  Vaping Use   Vaping Use: Never used  Substance and Sexual Activity   Alcohol use: Not Currently  Drug use: No   Sexual activity: Yes  Other Topics Concern   Not on file  Social History Narrative   RAISES HER GRANDSON SINCE HE WAS 2 YO. HE IS 6 YO NOW.   Social Determinants of Health   Financial Resource Strain: Low Risk  (01/11/2022)   Overall Financial Resource Strain (CARDIA)    Difficulty of Paying Living Expenses: Not hard at all  Food Insecurity: No Food Insecurity (01/28/2022)   Hunger Vital Sign    Worried About Running Out of Food in the Last Year: Never true    Ran Out of Food in the Last Year: Never true   Transportation Needs: No Transportation Needs (01/28/2022)   PRAPARE - Hydrologist (Medical): No    Lack of Transportation (Non-Medical): No  Recent Concern: Transportation Needs - Unmet Transportation Needs (01/12/2022)   PRAPARE - Hydrologist (Medical): Yes    Lack of Transportation (Non-Medical): No  Physical Activity: Not on file  Stress: Not on file  Social Connections: Not on file  Intimate Partner Violence: Not At Risk (01/28/2022)   Humiliation, Afraid, Rape, and Kick questionnaire    Fear of Current or Ex-Partner: No    Emotionally Abused: No    Physically Abused: No    Sexually Abused: No    Medications:   Current Outpatient Medications on File Prior to Visit  Medication Sig Dispense Refill   amLODipine (NORVASC) 5 MG tablet Take 1 tablet (5 mg total) by mouth daily. (Patient taking differently: Take 5 mg by mouth in the morning.) 30 tablet 0   apixaban (ELIQUIS) 5 MG TABS tablet Take 1 tablet (5 mg total) by mouth 2 (two) times daily. 60 tablet 0   diclofenac Sodium (VOLTAREN) 1 % GEL Apply 2 g topically in the morning and at bedtime. 150 g 0   metoprolol tartrate (LOPRESSOR) 25 MG tablet Take 1 tablet (25 mg total) by mouth 2 (two) times daily. 60 tablet 0   pantoprazole (PROTONIX) 40 MG tablet Take 1 tablet (40 mg total) by mouth at bedtime. 30 tablet 0   potassium chloride SA (KLOR-CON M) 20 MEQ tablet Take 2 tablets (40 mEq total) by mouth daily. (Patient taking differently: Take 40 mEq by mouth in the morning.) 60 tablet 0   rosuvastatin (CRESTOR) 40 MG tablet Place 1 tablet (40 mg total) into feeding tube daily. (Patient taking differently: Take 40 mg by mouth daily.) 30 tablet 0   No current facility-administered medications on file prior to visit.    Allergies:  No Known Allergies  Physical Exam General: well developed, well nourished pleasant elderly African-American lady, seated, in no evident  distress Head: head normocephalic and atraumatic.  Neck: supple with no carotid or supraclavicular bruits Cardiovascular: regular rate and rhythm, no murmurs Musculoskeletal: no deformity Skin:  no rash/petichiae Vascular:  Normal pulses all extremities Vitals:   02/13/22 1553  BP: 131/83  Pulse: 72   Neurologic Exam Mental Status: Awake and fully alert. Oriented to place and time. Recent and remote memory intact. Attention span, concentration and fund of knowledge appropriate. Mood and affect appropriate.  Slightly hesitant speech with occasional word finding difficulties. Cranial Nerves: Fundoscopic exam reveals sharp disc margins. Pupils equal, briskly reactive to light. Extraocular movements full without nystagmus. Visual fields full to confrontation. Hearing intact. Facial sensation intact. Face, tongue, palate moves normally and symmetrically.  Motor: Normal bulk and tone. Normal strength in all tested extremity muscles.  Sensory.: intact to touch ,pinprick .position and vibratory sensation.  Coordination: Rapid alternating movements normal in all extremities. Finger-to-nose and heel-to-shin performed accurately bilaterally. Gait and Station: Arises from chair without difficulty. Stance is normal.  Uses a wheeled walker.  Slight dragging of the right leg.  Cautious gait.   Reflexes: 1+ and symmetric. Toes downgoing.   NIHSS  2 Modified Rankin  3   ASSESSMENT: 81 year old African-American lady with left middle cerebral artery infarct and October 2023 secondary to left M1 occlusion s/p mechanical thrombectomy with excellent revascularization from new onset atrial fibrillation     PLAN:I had a long d/w patient and her son about her recent embolic stroke, mechanical thrombectomy and new onset atrial fibrillation,risk for recurrent stroke/TIAs, personally independently reviewed imaging studies and stroke evaluation results and answered questions.Continue Eliquis (apixaban) daily  for  secondary stroke prevention and maintain strict control of hypertension with blood pressure goal below 130/90, diabetes with hemoglobin A1c goal below 6.5% and lipids with LDL cholesterol goal below 70 mg/dL. I also advised the patient to eat a healthy diet with plenty of whole grains, cereals, fruits and vegetables, exercise regularly and maintain ideal body weight.  She was encouraged to use a walker at all times and was discussed fall safety precautions.  Patient may also consider possible participation in the [place] AF stroke prevention study ( Eliquis versu Milvexian- factor 11 inhibitor )if interested.  She was given written information to take home and review and decide followup in the future with me in 6 months or call earlier if necessary. Greater than 50% of time during this 35 minute visit was spent on counseling,explanation of diagnosis, planning of further management, discussion with patient and family and coordination of care Antony Contras, MD Note: This document was prepared with digital dictation and possible smart phrase technology. Any transcriptional errors that result from this process are unintentional

## 2022-02-16 ENCOUNTER — Other Ambulatory Visit: Payer: Self-pay | Admitting: Adult Health

## 2022-02-19 ENCOUNTER — Other Ambulatory Visit: Payer: Self-pay | Admitting: Adult Health

## 2022-02-19 DIAGNOSIS — Z1159 Encounter for screening for other viral diseases: Secondary | ICD-10-CM | POA: Diagnosis not present

## 2022-03-06 ENCOUNTER — Other Ambulatory Visit: Payer: Self-pay | Admitting: Adult Health

## 2022-03-07 ENCOUNTER — Other Ambulatory Visit: Payer: Self-pay | Admitting: Adult Health

## 2022-03-12 ENCOUNTER — Emergency Department (HOSPITAL_COMMUNITY): Payer: Medicare HMO

## 2022-03-12 ENCOUNTER — Observation Stay (HOSPITAL_COMMUNITY)
Admission: EM | Admit: 2022-03-12 | Discharge: 2022-03-14 | Disposition: A | Discharge status: Home / Self Care | Payer: Medicare HMO | Attending: Internal Medicine | Admitting: Internal Medicine

## 2022-03-12 ENCOUNTER — Other Ambulatory Visit: Payer: Self-pay

## 2022-03-12 ENCOUNTER — Encounter (HOSPITAL_COMMUNITY): Payer: Self-pay | Admitting: Emergency Medicine

## 2022-03-12 DIAGNOSIS — R000 Tachycardia, unspecified: Secondary | ICD-10-CM | POA: Insufficient documentation

## 2022-03-12 DIAGNOSIS — I69320 Aphasia following cerebral infarction: Secondary | ICD-10-CM | POA: Diagnosis not present

## 2022-03-12 DIAGNOSIS — R2689 Other abnormalities of gait and mobility: Secondary | ICD-10-CM | POA: Diagnosis not present

## 2022-03-12 DIAGNOSIS — E7849 Other hyperlipidemia: Secondary | ICD-10-CM | POA: Diagnosis not present

## 2022-03-12 DIAGNOSIS — Z79899 Other long term (current) drug therapy: Secondary | ICD-10-CM | POA: Insufficient documentation

## 2022-03-12 DIAGNOSIS — Z8673 Personal history of transient ischemic attack (TIA), and cerebral infarction without residual deficits: Secondary | ICD-10-CM | POA: Diagnosis not present

## 2022-03-12 DIAGNOSIS — R0602 Shortness of breath: Secondary | ICD-10-CM | POA: Diagnosis not present

## 2022-03-12 DIAGNOSIS — R42 Dizziness and giddiness: Secondary | ICD-10-CM | POA: Diagnosis not present

## 2022-03-12 DIAGNOSIS — Z1152 Encounter for screening for COVID-19: Secondary | ICD-10-CM | POA: Insufficient documentation

## 2022-03-12 DIAGNOSIS — E6609 Other obesity due to excess calories: Secondary | ICD-10-CM | POA: Diagnosis present

## 2022-03-12 DIAGNOSIS — K219 Gastro-esophageal reflux disease without esophagitis: Secondary | ICD-10-CM | POA: Diagnosis not present

## 2022-03-12 DIAGNOSIS — Z0001 Encounter for general adult medical examination with abnormal findings: Secondary | ICD-10-CM | POA: Diagnosis not present

## 2022-03-12 DIAGNOSIS — R1110 Vomiting, unspecified: Secondary | ICD-10-CM | POA: Diagnosis not present

## 2022-03-12 DIAGNOSIS — Z1331 Encounter for screening for depression: Secondary | ICD-10-CM | POA: Diagnosis not present

## 2022-03-12 DIAGNOSIS — I4891 Unspecified atrial fibrillation: Secondary | ICD-10-CM | POA: Diagnosis not present

## 2022-03-12 DIAGNOSIS — R531 Weakness: Secondary | ICD-10-CM | POA: Diagnosis not present

## 2022-03-12 DIAGNOSIS — I10 Essential (primary) hypertension: Secondary | ICD-10-CM | POA: Diagnosis present

## 2022-03-12 DIAGNOSIS — R2681 Unsteadiness on feet: Secondary | ICD-10-CM | POA: Diagnosis not present

## 2022-03-12 DIAGNOSIS — I639 Cerebral infarction, unspecified: Secondary | ICD-10-CM | POA: Diagnosis not present

## 2022-03-12 DIAGNOSIS — N179 Acute kidney failure, unspecified: Secondary | ICD-10-CM | POA: Diagnosis present

## 2022-03-12 DIAGNOSIS — I4892 Unspecified atrial flutter: Secondary | ICD-10-CM | POA: Diagnosis not present

## 2022-03-12 DIAGNOSIS — D649 Anemia, unspecified: Secondary | ICD-10-CM | POA: Diagnosis not present

## 2022-03-12 DIAGNOSIS — E785 Hyperlipidemia, unspecified: Secondary | ICD-10-CM | POA: Diagnosis not present

## 2022-03-12 DIAGNOSIS — I4819 Other persistent atrial fibrillation: Secondary | ICD-10-CM | POA: Diagnosis present

## 2022-03-12 DIAGNOSIS — Z6826 Body mass index (BMI) 26.0-26.9, adult: Secondary | ICD-10-CM | POA: Diagnosis not present

## 2022-03-12 DIAGNOSIS — I484 Atypical atrial flutter: Secondary | ICD-10-CM | POA: Diagnosis not present

## 2022-03-12 DIAGNOSIS — I700 Atherosclerosis of aorta: Secondary | ICD-10-CM | POA: Diagnosis not present

## 2022-03-12 DIAGNOSIS — I361 Nonrheumatic tricuspid (valve) insufficiency: Secondary | ICD-10-CM | POA: Diagnosis not present

## 2022-03-12 DIAGNOSIS — I69351 Hemiplegia and hemiparesis following cerebral infarction affecting right dominant side: Secondary | ICD-10-CM | POA: Diagnosis not present

## 2022-03-12 LAB — CBC
HCT: 47.1 % — ABNORMAL HIGH (ref 36.0–46.0)
Hemoglobin: 15.1 g/dL — ABNORMAL HIGH (ref 12.0–15.0)
MCH: 25.7 pg — ABNORMAL LOW (ref 26.0–34.0)
MCHC: 32.1 g/dL (ref 30.0–36.0)
MCV: 80.1 fL (ref 80.0–100.0)
Platelets: 162 10*3/uL (ref 150–400)
RBC: 5.88 MIL/uL — ABNORMAL HIGH (ref 3.87–5.11)
RDW: 18 % — ABNORMAL HIGH (ref 11.5–15.5)
WBC: 5.8 10*3/uL (ref 4.0–10.5)
nRBC: 0 % (ref 0.0–0.2)

## 2022-03-12 LAB — COMPREHENSIVE METABOLIC PANEL
ALT: 18 U/L (ref 0–44)
AST: 36 U/L (ref 15–41)
Albumin: 4.1 g/dL (ref 3.5–5.0)
Alkaline Phosphatase: 65 U/L (ref 38–126)
Anion gap: 17 — ABNORMAL HIGH (ref 5–15)
BUN: 9 mg/dL (ref 8–23)
CO2: 16 mmol/L — ABNORMAL LOW (ref 22–32)
Calcium: 10.1 mg/dL (ref 8.9–10.3)
Chloride: 98 mmol/L (ref 98–111)
Creatinine, Ser: 1.53 mg/dL — ABNORMAL HIGH (ref 0.44–1.00)
GFR, Estimated: 34 mL/min — ABNORMAL LOW (ref >60)
Glucose, Bld: 99 mg/dL (ref 70–99)
Potassium: 3.5 mmol/L (ref 3.5–5.1)
Sodium: 131 mmol/L — ABNORMAL LOW (ref 135–145)
Total Bilirubin: 1.2 mg/dL (ref 0.3–1.2)
Total Protein: 7.8 g/dL (ref 6.5–8.1)

## 2022-03-12 LAB — PROTIME-INR
INR: 1.3 — ABNORMAL HIGH (ref 0.8–1.2)
Prothrombin Time: 16.1 seconds — ABNORMAL HIGH (ref 11.4–15.2)

## 2022-03-12 LAB — RESP PANEL BY RT-PCR (RSV, FLU A&B, COVID)  RVPGX2
Influenza A by PCR: NEGATIVE
Influenza B by PCR: NEGATIVE
Resp Syncytial Virus by PCR: NEGATIVE
SARS Coronavirus 2 by RT PCR: NEGATIVE

## 2022-03-12 LAB — PHOSPHORUS: Phosphorus: 3.5 mg/dL (ref 2.5–4.6)

## 2022-03-12 LAB — MAGNESIUM: Magnesium: 2 mg/dL (ref 1.7–2.4)

## 2022-03-12 MED ORDER — SODIUM CHLORIDE 0.9% FLUSH
3.0000 mL | Freq: Two times a day (BID) | INTRAVENOUS | Status: DC
  Administered 2022-03-12 – 2022-03-14 (×5): 3 mL via INTRAVENOUS

## 2022-03-12 MED ORDER — ONDANSETRON HCL 4 MG PO TABS: 4.0000 mg | ORAL_TABLET | Freq: Four times a day (QID) | ORAL | Status: DC | PRN

## 2022-03-12 MED ORDER — DILTIAZEM HCL 25 MG/5ML IV SOLN
10.0000 mg | Freq: Once | INTRAVENOUS | Status: AC
  Administered 2022-03-12: 10 mg via INTRAVENOUS
  Filled 2022-03-12: qty 5

## 2022-03-12 MED ORDER — ROSUVASTATIN CALCIUM 20 MG PO TABS
40.0000 mg | ORAL_TABLET | Freq: Every day | ORAL | Status: DC
  Administered 2022-03-12 – 2022-03-14 (×3): 40 mg via ORAL
  Filled 2022-03-12 (×3): qty 2

## 2022-03-12 MED ORDER — ACETAMINOPHEN 325 MG PO TABS: 650.0000 mg | ORAL_TABLET | Freq: Four times a day (QID) | ORAL | Status: DC | PRN

## 2022-03-12 MED ORDER — OXYCODONE HCL 5 MG PO TABS: 5.0000 mg | ORAL_TABLET | ORAL | Status: DC | PRN

## 2022-03-12 MED ORDER — BISACODYL 5 MG PO TBEC: 5.0000 mg | DELAYED_RELEASE_TABLET | Freq: Every day | ORAL | Status: DC | PRN

## 2022-03-12 MED ORDER — ONDANSETRON HCL 4 MG/2ML IJ SOLN
4.0000 mg | Freq: Four times a day (QID) | INTRAMUSCULAR | Status: DC | PRN
  Administered 2022-03-12: 4 mg via INTRAVENOUS
  Filled 2022-03-12: qty 2

## 2022-03-12 MED ORDER — METOPROLOL TARTRATE 25 MG PO TABS
25.0000 mg | ORAL_TABLET | Freq: Two times a day (BID) | ORAL | Status: DC
  Administered 2022-03-12: 25 mg via ORAL
  Filled 2022-03-12: qty 1

## 2022-03-12 MED ORDER — LACTATED RINGERS IV BOLUS
1000.0000 mL | Freq: Once | INTRAVENOUS | Status: AC
  Administered 2022-03-12: 1000 mL via INTRAVENOUS

## 2022-03-12 MED ORDER — LACTATED RINGERS IV SOLN: INTRAVENOUS | Status: DC

## 2022-03-12 MED ORDER — SODIUM CHLORIDE 0.9 % IV SOLN: 250.0000 mL | INTRAVENOUS | Status: DC | PRN

## 2022-03-12 MED ORDER — IPRATROPIUM BROMIDE 0.02 % IN SOLN: 0.5000 mg | Freq: Four times a day (QID) | RESPIRATORY_TRACT | Status: DC | PRN

## 2022-03-12 MED ORDER — APIXABAN 5 MG PO TABS
5.0000 mg | ORAL_TABLET | Freq: Two times a day (BID) | ORAL | Status: DC
  Administered 2022-03-12 – 2022-03-14 (×4): 5 mg via ORAL
  Filled 2022-03-12 (×4): qty 1

## 2022-03-12 MED ORDER — LEVALBUTEROL HCL 0.63 MG/3ML IN NEBU: 0.6300 mg | INHALATION_SOLUTION | Freq: Four times a day (QID) | RESPIRATORY_TRACT | Status: DC | PRN

## 2022-03-12 MED ORDER — SENNOSIDES-DOCUSATE SODIUM 8.6-50 MG PO TABS: 1.0000 | ORAL_TABLET | Freq: Every evening | ORAL | Status: DC | PRN

## 2022-03-12 MED ORDER — HYDRALAZINE HCL 20 MG/ML IJ SOLN: 10.0000 mg | INTRAMUSCULAR | Status: DC | PRN

## 2022-03-12 MED ORDER — DILTIAZEM HCL-DEXTROSE 125-5 MG/125ML-% IV SOLN (PREMIX)
5.0000 mg/h | INTRAVENOUS | Status: DC
  Administered 2022-03-12: 5 mg/h via INTRAVENOUS
  Filled 2022-03-12: qty 125

## 2022-03-12 MED ORDER — HYDROMORPHONE HCL 1 MG/ML IJ SOLN: 0.5000 mg | INTRAMUSCULAR | Status: DC | PRN

## 2022-03-12 MED ORDER — METOPROLOL TARTRATE 25 MG PO TABS: 25.0000 mg | ORAL_TABLET | Freq: Once | ORAL | Status: DC

## 2022-03-12 MED ORDER — TRAZODONE HCL 50 MG PO TABS: 25.0000 mg | ORAL_TABLET | Freq: Every evening | ORAL | Status: DC | PRN

## 2022-03-12 MED ORDER — FLEET ENEMA 7-19 GM/118ML RE ENEM: 1.0000 | ENEMA | Freq: Once | RECTAL | Status: DC | PRN

## 2022-03-12 MED ORDER — ACETAMINOPHEN 650 MG RE SUPP: 650.0000 mg | Freq: Four times a day (QID) | RECTAL | Status: DC | PRN

## 2022-03-12 MED ORDER — CLOPIDOGREL BISULFATE 75 MG PO TABS
75.0000 mg | ORAL_TABLET | Freq: Every day | ORAL | Status: DC
  Administered 2022-03-12 – 2022-03-14 (×3): 75 mg via ORAL
  Filled 2022-03-12 (×3): qty 1

## 2022-03-12 MED ORDER — PANTOPRAZOLE SODIUM 40 MG PO TBEC
40.0000 mg | DELAYED_RELEASE_TABLET | Freq: Every day | ORAL | Status: DC
  Administered 2022-03-12 – 2022-03-13 (×2): 40 mg via ORAL
  Filled 2022-03-12 (×2): qty 1

## 2022-03-12 NOTE — Assessment & Plan Note (Addendum)
-  Continue home medication Eliquis, statins, -Also on Plavix

## 2022-03-12 NOTE — Assessment & Plan Note (Signed)
Acute versus chronic-improving Lab Results  Component Value Date   CREATININE 1.21 (H) 03/13/2022   CREATININE 1.53 (H) 03/12/2022   CREATININE 0.96 01/29/2022

## 2022-03-12 NOTE — Assessment & Plan Note (Addendum)
Monitoring H&H closely     Latest Ref Rng & Units 03/13/2022    4:19 AM 03/12/2022   11:49 AM 01/29/2022    6:20 AM  CBC  WBC 4.0 - 10.5 K/uL 3.9  5.8  3.5   Hemoglobin 12.0 - 15.0 g/dL 12.4  15.1  13.1   Hematocrit 36.0 - 46.0 % 38.5  47.1  40.2   Platelets 150 - 400 K/uL 133  162  172

## 2022-03-12 NOTE — ED Provider Notes (Signed)
[facility] EMERGENCY DEPARTMENT Provider Note  CSN: [id number] Arrival date & time: 03/12/22 1138  Chief Complaint(s) Weakness  HPI Janice Curtis is a 82 y.o. female with PMH chronic A-fib on Eliquis, CVA, HTN who presents emergency department for evaluation of generalized weakness and rapid heart rate.  Patient states that she did not have a chance to take her Eliquis or her metoprolol this morning.  She reports multiple episodes of vomiting last night but currently does not feel nauseous.  She saw her primary care physician today who found the patient to be in rapid A-fib with RVR and sent the patient to the emergency department for further evaluation.  Currently she endorses some lightheadedness but denies chest pain, shortness of breath, abdominal pain, nausea, vomiting or other systemic symptoms.   Past Medical History Past Medical History:  Diagnosis Date   Anxiety    Colon cancer ([place])    Hypercholesterolemia    Hypertension    Obesity    Stroke ([place]) 12/05/2009   Patient Active Problem List   Diagnosis Date Noted   Persistent atrial fibrillation ([place]) 01/28/2022   Hypotension 01/28/2022   UTI (urinary tract infection) 01/28/2022   Nausea and vomiting 01/28/2022   Acute renal failure ([place]) 01/01/2022   Hypokalemia 01/01/2022   History of ischemic stroke 12/21/2021   GERD without esophagitis 12/21/2021   Obesity due to excess calories with serious comorbidity 12/21/2021   Normochromic anemia 12/21/2021   Hyperlipidemia 12/15/2021   Dysphagia due to recent stroke 12/15/2021   New onset atrial fibrillation ([place]) 12/15/2021   Acute ischemic stroke ([place]) 12/09/2021   Middle cerebral artery embolism, left 12/09/2021   CVA (cerebral vascular accident) ([place]) 10/20/2020   Essential hypertension 12/08/2008   History of malignant neoplasm of large intestine 12/08/2008   Home Medication(s) Prior to Admission medications   Medication Sig Start Date End Date Taking? Authorizing  Provider  amLODipine (NORVASC) 5 MG tablet Take 1 tablet (5 mg total) by mouth daily. Patient taking differently: Take 5 mg by mouth in the morning. 01/03/22   Gerlene Fee, NP  apixaban (ELIQUIS) 5 MG TABS tablet Take 1 tablet (5 mg total) by mouth 2 (two) times daily. 01/03/22   Gerlene Fee, NP  diclofenac Sodium (VOLTAREN) 1 % GEL Apply 2 g topically in the morning and at bedtime. 01/03/22   Gerlene Fee, NP  metoprolol tartrate (LOPRESSOR) 25 MG tablet Take 1 tablet (25 mg total) by mouth 2 (two) times daily. 01/29/22   Kathie Dike, MD  pantoprazole (PROTONIX) 40 MG tablet Take 1 tablet (40 mg total) by mouth at bedtime. 01/03/22   Gerlene Fee, NP  potassium chloride SA (KLOR-CON M) 20 MEQ tablet Take 2 tablets (40 mEq total) by mouth daily. Patient taking differently: Take 40 mEq by mouth in the morning. 01/03/22   Gerlene Fee, NP  rosuvastatin (CRESTOR) 40 MG tablet Place 1 tablet (40 mg total) into feeding tube daily. Patient taking differently: Take 40 mg by mouth daily. 01/03/22   Gerlene Fee, NP  Past Surgical History Past Surgical History:  Procedure Laterality Date   COLONOSCOPY  12/27/08   simple adenoma/inflammatory polyp at anastomosis   COLONOSCOPY  04/23/2011   Procedure: COLONOSCOPY;  Surgeon: Dorothyann Peng, MD;  [facility];  Service: Endoscopy;  Laterality: N/A;  10:00   IR CT HEAD LTD  12/09/2021   IR PERCUTANEOUS ART THROMBECTOMY/INFUSION INTRACRANIAL INC DIAG ANGIO  12/09/2021   RADIOLOGY WITH ANESTHESIA N/A 12/09/2021   Procedure: RADIOLOGY WITH ANESTHESIA;  Surgeon: Radiologist, Medication, MD;  Location: [facility];  Service: Radiology;  Laterality: N/A;   RIGHT COLECTOMY  11/14/2007   Family History Family History  Problem Relation Age of Onset   Heart attack Mother    Stroke Father     Social  History Social History   Tobacco Use   Smoking status: Never   Smokeless tobacco: Never  Vaping Use   Vaping Use: Never used  Substance Use Topics   Alcohol use: Not Currently   Drug use: No   Allergies Patient has no known allergies.  Review of Systems Review of Systems  Cardiovascular:  Positive for palpitations.  Neurological:  Positive for light-headedness.    Physical Exam Vital Signs  I have reviewed the triage vital signs BP 137/66   Pulse 72   Temp 97.6 F (36.4 C) (Oral)   Resp (!) 21   SpO2 100%   Physical Exam Vitals and nursing note reviewed.  Constitutional:      General: She is not in acute distress.    Appearance: She is well-developed.  HENT:     Head: Normocephalic and atraumatic.  Eyes:     Conjunctiva/sclera: Conjunctivae normal.  Cardiovascular:     Rate and Rhythm: Tachycardia present. Rhythm irregular.     Heart sounds: No murmur heard. Pulmonary:     Effort: Pulmonary effort is normal. No respiratory distress.     Breath sounds: Normal breath sounds.  Abdominal:     Palpations: Abdomen is soft.     Tenderness: There is no abdominal tenderness.  Musculoskeletal:        General: No swelling.     Cervical back: Neck supple.  Skin:    General: Skin is warm and dry.     Capillary Refill: Capillary refill takes less than 2 seconds.  Neurological:     Mental Status: She is alert.  Psychiatric:        Mood and Affect: Mood normal.     ED Results and Treatments Labs (all labs ordered are listed, but only abnormal results are displayed) Labs Reviewed  CBC - Abnormal; Notable for the following components:      Result Value   RBC 5.88 (*)    Hemoglobin 15.1 (*)    HCT 47.1 (*)    MCH 25.7 (*)    RDW 18.0 (*)    All other components within normal limits  COMPREHENSIVE METABOLIC PANEL - Abnormal; Notable for the following components:   Sodium 131 (*)    CO2 16 (*)    Creatinine, Ser 1.53 (*)    GFR, Estimated 34 (*)    Anion gap  17 (*)    All other components within normal limits  Radiology DG Chest Port 1 View  Result Date: 03/12/2022 CLINICAL DATA:  Shortness of breath. Weakness. Atrial fibrillation. EXAM: PORTABLE CHEST 1 VIEW COMPARISON:  01/28/2022 FINDINGS: Mild prominence of the main pulmonary artery which can be seen in the setting of pulmonary arterial hypertension. Overall heart size is with within normal limits. Atherosclerotic calcification of the aortic arch. Thoracic spondylosis. The lungs appear clear. IMPRESSION: 1. Mild prominence of the main pulmonary artery which can be seen in the setting of pulmonary arterial hypertension. 2. Atherosclerotic calcification of the aortic arch. 3. Thoracic spondylosis. Electronically Signed   By: Van Clines M.D.   On: 03/12/2022 12:21    Pertinent labs & imaging results that were available during my care of the patient were reviewed by me and considered in my medical decision making (see MDM for details).  Medications Ordered in ED Medications  lactated ringers bolus 1,000 mL (1,000 mLs Intravenous New Bag/Given 03/12/22 1207)  diltiazem (CARDIZEM) injection 10 mg (10 mg Intravenous Given 03/12/22 1222)                                                                                                                                     Procedures .Critical Care  Performed by: Teressa Lower, MD Authorized by: Teressa Lower, MD   Critical care provider statement:    Critical care time (minutes):  30   Critical care was necessary to treat or prevent imminent or life-threatening deterioration of the following conditions:  Cardiac failure   Critical care was time spent personally by me on the following activities:  Development of treatment plan with patient or surrogate, discussions with consultants, evaluation of patient's response to treatment,  examination of patient, ordering and review of laboratory studies, ordering and review of radiographic studies, ordering and performing treatments and interventions, pulse oximetry, re-evaluation of patient's condition and review of old charts   (including critical care time)  Medical Decision Making / ED Course   This patient presents to the ED for concern of rapid heart rate, this involves an extensive number of treatment options, and is a complaint that carries with it a high risk of complications and morbidity.  The differential diagnosis includes A-fib with RVR, a flutter, SVT, electrolyte abnormality, dehydration, medication noncompliance, medication failure  MDM: Patient seen emerged part for evaluation of rapid heart rate.  Physical exam with an irregular tachycardia but is otherwise unremarkable.  Laboratory evaluation with a new creatinine elevation to 1.53, CO2 16, hemoglobin 15.1, COVID, flu, RSV all negative.  Fluid resuscitation begun and the patient received a single bolus of diltiazem which initially did lead to resolution of her RVR and rates were controlled but on reevaluation she returned into A-fib with RVR with rates greater than 130 and patient was started on a diltiazem drip.  Cardiology was consulted and patient will require hospital admission for A-fib with RVR.   Additional history obtained: -Additional history obtained  from son -External records from outside source obtained and reviewed including: Chart review including previous notes, labs, imaging, consultation notes   Lab Tests: -I ordered, reviewed, and interpreted labs.   The pertinent results include:   Labs Reviewed  CBC - Abnormal; Notable for the following components:      Result Value   RBC 5.88 (*)    Hemoglobin 15.1 (*)    HCT 47.1 (*)    MCH 25.7 (*)    RDW 18.0 (*)    All other components within normal limits  COMPREHENSIVE METABOLIC PANEL - Abnormal; Notable for the following components:    Sodium 131 (*)    CO2 16 (*)    Creatinine, Ser 1.53 (*)    GFR, Estimated 34 (*)    Anion gap 17 (*)    All other components within normal limits      EKG   EKG Interpretation  Date/Time:  Monday March 12 2022 12:46:16 EST Ventricular Rate:  85 PR Interval:    QRS Duration: 109 QT Interval:  419 QTC Calculation: 499 R Axis:   -63 Text Interpretation: Atrial fibrillation Ventricular premature complex Left anterior fascicular block Confirmed by Jawad Wiacek ([id number]) on 03/12/2022 12:53:05 PM         Imaging Studies ordered: I ordered imaging studies including chest x-ray I independently visualized and interpreted imaging. I agree with the radiologist interpretation   Medicines ordered and prescription drug management: Meds ordered this encounter  Medications   lactated ringers bolus 1,000 mL   diltiazem (CARDIZEM) injection 10 mg    -I have reviewed the patients home medicines and have made adjustments as needed  Critical interventions Diltiazem drip  Consultations Obtained: I requested consultation with the cardiologist on-call,  and discussed lab and imaging findings as well as pertinent plan - they recommend: Diltiazem drip and echo, hospital admission   Cardiac Monitoring: The patient was maintained on a cardiac monitor.  I personally viewed and interpreted the cardiac monitored which showed an underlying rhythm of: A-fib with RVR, A-fib  Social Determinants of Health:  Factors impacting patients care include: nonde   Reevaluation: After the interventions noted above, I reevaluated the patient and found that they have :improved  Co morbidities that complicate the patient evaluation  Past Medical History:  Diagnosis Date   Anxiety    Colon cancer ([place])    Hypercholesterolemia    Hypertension    Obesity    Stroke ([place]) 12/05/2009      Dispostion: I considered admission for this patient, and due to persistent A-fib with RVR patient require  hospital admission     Final Clinical Impression(s) / ED Diagnoses Final diagnoses:  None     '@PCDICTATION'$ @    Teressa Lower, MD 03/12/22 [phone]

## 2022-03-12 NOTE — Assessment & Plan Note (Signed)
There is no height or weight on file to calculate BMI. Height 5 3, last weight 73 kg

## 2022-03-12 NOTE — ED Triage Notes (Signed)
Pt came from pcp due to a fib uncontrolled rvr. Went for check up.  Pt denies chest pain or pressure. Pt c/o weakness since last night, n/v/d since last night. N/v x 1, diarrhea x 2 since started yesterday. Son with pt.

## 2022-03-12 NOTE — Assessment & Plan Note (Signed)
-   Blood pressure stable -monitoring closely with Cardizem drip -Transitioning off Cardizem -Holding metoprolol and Norvasc  -As needed hydralazine

## 2022-03-12 NOTE — Assessment & Plan Note (Signed)
Continue Protonix °

## 2022-03-12 NOTE — Plan of Care (Signed)

## 2022-03-12 NOTE — Assessment & Plan Note (Signed)
-  In A-fib with RVR -Still on Cardizem drip rate much improved -Cardiology transitioning to p.o. Cardizem 60 mg p.o. every 6 hours, but holding metoprolol  -Further workup and treatment per cardiology -Continue home dose Eliquis, and metoprolol

## 2022-03-12 NOTE — ED Notes (Signed)
Cards at bedside

## 2022-03-12 NOTE — ED Notes (Signed)
Pt placed on ZOLL

## 2022-03-12 NOTE — H&P (Signed)
History and Physical   Patient: Janice Curtis                            PCP: [facility]                    DOB: December 27, 1940            DOA: 03/12/2022 YKD:[id number]             DOS: 03/12/2022, 4:07 PM  [facility], [facility]  Patient coming from:   HOME  I have personally reviewed patient's medical records, in electronic medical records, including:  [place] link, and care everywhere.    Chief Complaint:   Chief Complaint  Patient presents with   Weakness    History of present illness:    Janice Curtis is a 82 year old female with extensive history of persistent A-fib on Eliquis, anxiety, history of colon cancer, history of CVA, HTN, HLD, obesity ... Presented with generalized weaknesses and palpitation. Denies any chest pain but reported her symptoms started last night after episode of nausea vomiting diarrhea.  Had 1 episode of nausea vomiting nonbloody.  2 episode of diarrhea since yesterday nonbloody.   ED course:  Heart rate as high as 177 currently on Cardizem drip -heart rate 78 Blood pressure 138/85, pulse 78, temperature 97.6 F (36.4 C),  resp. rate (!) 21, SpO2 99 %.  On room air  Labs CBC: Hemoglobin 15.1, CMP sodium 131, creatinine 1.53, TSH 2.16,  Respiratory panel: Influenza A/B, RSV, COVID.Marland Kitchen  All negative  Patient was given 10 mg of IV Cardizem bolus, started on Cardizem drip Cardiology was consulted    Patient Denies having: Fever, Chills, Cough, SOB, Chest Pain, Abd pain, headache, dizziness, lightheadedness,  Dysuria, Joint pain, rash, open wounds    Review of Systems: As per HPI, otherwise 10 point review of systems were negative.   ----------------------------------------------------------------------------------------------------------------------  No Known Allergies  Home MEDs:  Prior to Admission medications   Medication Sig Start Date End Date Taking? Authorizing Provider  apixaban (ELIQUIS) 5 MG TABS  tablet Take 1 tablet (5 mg total) by mouth 2 (two) times daily. 01/03/22  Yes Gerlene Fee, NP  clopidogrel (PLAVIX) 75 MG tablet Take 75 mg by mouth daily. 03/06/22  Yes [provider]  metoprolol tartrate (LOPRESSOR) 25 MG tablet Take 1 tablet (25 mg total) by mouth 2 (two) times daily. 01/29/22  Yes Kathie Dike, MD  pantoprazole (PROTONIX) 40 MG tablet Take 1 tablet (40 mg total) by mouth at bedtime. 01/03/22  Yes Gerlene Fee, NP  potassium chloride SA (KLOR-CON M) 20 MEQ tablet Take 2 tablets (40 mEq total) by mouth daily. Patient taking differently: Take 40 mEq by mouth in the morning. 01/03/22  Yes Gerlene Fee, NP  rosuvastatin (CRESTOR) 40 MG tablet Place 1 tablet (40 mg total) into feeding tube daily. Patient taking differently: Take 40 mg by mouth daily. 01/03/22  Yes Gerlene Fee, NP  amLODipine (NORVASC) 5 MG tablet Take 1 tablet (5 mg total) by mouth daily. Patient not taking: Reported on 03/12/2022 01/03/22   Gerlene Fee, NP  diclofenac Sodium (VOLTAREN) 1 % GEL Apply 2 g topically in the morning and at bedtime. Patient not taking: Reported on 03/12/2022 01/03/22   Gerlene Fee, NP    PRN MEDs: sodium chloride, acetaminophen **OR** acetaminophen, bisacodyl, hydrALAZINE, HYDROmorphone (DILAUDID) injection, ipratropium, levalbuterol, ondansetron **OR**  ondansetron (ZOFRAN) IV, oxyCODONE, senna-docusate, sodium phosphate, traZODone  Past Medical History:  Diagnosis Date   Anxiety    Colon cancer ([place])    Hypercholesterolemia    Hypertension    Obesity    Stroke ([place]) 12/05/2009    Past Surgical History:  Procedure Laterality Date   COLONOSCOPY  12/27/08   simple adenoma/inflammatory polyp at anastomosis   COLONOSCOPY  04/23/2011   Procedure: COLONOSCOPY;  Surgeon: Dorothyann Peng, MD;  [facility];  Service: Endoscopy;  Laterality: N/A;  10:00   IR CT HEAD LTD  12/09/2021   IR PERCUTANEOUS ART THROMBECTOMY/INFUSION INTRACRANIAL INC DIAG  ANGIO  12/09/2021   RADIOLOGY WITH ANESTHESIA N/A 12/09/2021   Procedure: RADIOLOGY WITH ANESTHESIA;  Surgeon: Radiologist, Medication, MD;  Location: [place];  Service: Radiology;  Laterality: N/A;   RIGHT COLECTOMY  11/14/2007     reports that she has never smoked. She has never used smokeless tobacco. She reports that she does not currently use alcohol. She reports that she does not use drugs.   Family History  Problem Relation Age of Onset   Heart attack Mother    Stroke Father     Physical Exam:   Vitals:   03/12/22 1430 03/12/22 1500 03/12/22 1530 03/12/22 1553  BP: 138/85 131/86 124/80   Pulse: 78 86    Resp: (!) 21 (!) 21 15   Temp:    98.7 F (37.1 C)  TempSrc:    Oral  SpO2: 99% 100%     Constitutional: NAD, calm, comfortable Eyes: PERRL, lids and conjunctivae normal ENMT: Mucous membranes are moist. Posterior pharynx clear of any exudate or lesions.Normal dentition.  Neck: normal, supple, no masses, no thyromegaly Respiratory: clear to auscultation bilaterally, no wheezing, no crackles. Normal respiratory effort. No accessory muscle use.  Cardiovascular: Irregularly irregular, no murmurs / rubs / gallops. No extremity edema. 2+ pedal pulses. No carotid bruits.  Abdomen: no tenderness, no masses palpated. No hepatosplenomegaly. Bowel sounds positive.  Musculoskeletal: RL Ext.> LL Ext.  Weakness,  no clubbing / cyanosis. No joint deformity upper and lower extremities. Good ROM, no contractures. Normal muscle tone.  Neurologic: R> L  weakness CN II-XII grossly intact. Sensation intact, DTR normal. Strength 5/5 in all 4.  Psychiatric: Normal judgment and insight. Alert and oriented x 3. Normal mood.  Skin: no rashes, lesions, ulcers. No induration Decubitus/ulcers:  Wounds: per nursing documentation         Labs on admission:    I have personally reviewed following labs and imaging studies  CBC: Recent Labs  Lab 03/12/22 1149  WBC 5.8  HGB 15.1*  HCT 47.1*   MCV 80.1  PLT 939   Basic Metabolic Panel: Recent Labs  Lab 03/12/22 1149 03/12/22 1200  NA  --  131*  K  --  3.5  CL  --  98  CO2  --  16*  GLUCOSE  --  99  BUN  --  9  CREATININE  --  1.53*  CALCIUM  --  10.1  MG 2.0  --   PHOS 3.5  --    GFR: CrCl cannot be calculated (Unknown ideal weight.). Liver Function Tests: Recent Labs  Lab 03/12/22 1200  AST 36  ALT 18  ALKPHOS 65  BILITOT 1.2  PROT 7.8  ALBUMIN 4.1   No results for input(s): "LIPASE", "AMYLASE" in the last 168 hours. No results for input(s): "AMMONIA" in the last 168 hours. Coagulation Profile: Recent Labs  Lab  03/12/22 1149  INR 1.3*    Urine analysis:    Component Value Date/Time   COLORURINE STRAW (A) 01/28/2022 1029   APPEARANCEUR CLEAR 01/28/2022 1029   LABSPEC 1.009 01/28/2022 1029   PHURINE 7.0 01/28/2022 1029   GLUCOSEU NEGATIVE 01/28/2022 1029   HGBUR MODERATE (A) 01/28/2022 1029   BILIRUBINUR NEGATIVE 01/28/2022 1029   KETONESUR NEGATIVE 01/28/2022 1029   PROTEINUR 30 (A) 01/28/2022 1029   UROBILINOGEN 0.2 11/11/2011 1026   NITRITE NEGATIVE 01/28/2022 1029   LEUKOCYTESUR TRACE (A) 01/28/2022 1029    Last A1C:  Lab Results  Component Value Date   HGBA1C 5.7 (H) 12/09/2021     Radiologic Exams on Admission:   DG Chest Port 1 View  Result Date: 03/12/2022 CLINICAL DATA:  Shortness of breath. Weakness. Atrial fibrillation. EXAM: PORTABLE CHEST 1 VIEW COMPARISON:  01/28/2022 FINDINGS: Mild prominence of the main pulmonary artery which can be seen in the setting of pulmonary arterial hypertension. Overall heart size is with within normal limits. Atherosclerotic calcification of the aortic arch. Thoracic spondylosis. The lungs appear clear. IMPRESSION: 1. Mild prominence of the main pulmonary artery which can be seen in the setting of pulmonary arterial hypertension. 2. Atherosclerotic calcification of the aortic arch. 3. Thoracic spondylosis. Electronically Signed   By: Van Clines M.D.   On: 03/12/2022 12:21    EKG:   Independently reviewed.  Orders placed or performed during the hospital encounter of 03/12/22   EKG 12-Lead   EKG 12-Lead   ED EKG   ED EKG   EKG 12-Lead   EKG 12-Lead   EKG 12-Lead   ---------------------------------------------------------------------------------------------------------------------------------------    Assessment / Plan:   Principal Problem:   Atrial fibrillation with rapid ventricular response (HCC) Active Problems:   Acute renal failure (HCC)   Essential hypertension   Hyperlipidemia   GERD without esophagitis   Obesity due to excess calories with serious comorbidity   Normochromic anemia   Persistent atrial fibrillation (HCC)   History of CVA (cerebrovascular accident)   Assessment and Plan: * Atrial fibrillation with rapid ventricular response ([place]) -In A-fib with RVR -EDP initiated Cardizem drip -Cardizem bolus  -Admitted to stepdown unit on Cardizem drip -with goal of heart rate less than 100 -Cardiology consulted -Further workup and treatment per cardiology -Continue home dose Eliquis, and metoprolol  Acute renal failure ([place]) Acute versus chronic Lab Results  Component Value Date   CREATININE 1.53 (H) 03/12/2022   CREATININE 0.96 01/29/2022   CREATININE 1.13 (H) 01/28/2022     History of CVA (cerebrovascular accident) -Continue home medication Eliquis, statins, -Also on Plavix  Persistent atrial fibrillation (HCC) - Chronic Home medication includes metoprolol and Eliquis - Currently in A-fib with RVR initiated Cardizem drip -Further evaluation recommendation per cardiology  Normochromic anemia Monitoring H&H closely     Latest Ref Rng & Units 03/12/2022   11:49 AM 01/29/2022    6:20 AM 01/28/2022    9:13 AM  CBC  WBC 4.0 - 10.5 K/uL 5.8  3.5  2.9   Hemoglobin 12.0 - 15.0 g/dL 15.1  13.1  13.2   Hematocrit 36.0 - 46.0 % 47.1  40.2  40.4   Platelets 150 - 400 K/uL 162   172  153      Obesity due to excess calories with serious comorbidity There is no height or weight on file to calculate BMI. Height 5 3, last weight 73 kg  GERD without esophagitis - Continue Protonix  Hyperlipidemia Continue Crestor  Essential  hypertension - Blood pressure stable -monitoring closely with Cardizem drip -Continue home medication of metoprolol, -It appears the does not take Norvasc anymore -As needed hydralazine     Consults called:   Cardiology -------------------------------------------------------------------------------------------------------------------------------------------- DVT prophylaxis:  TED hose Start: 03/12/22 1420 SCDs Start: 03/12/22 1420 apixaban (ELIQUIS) tablet 5 mg   Code Status:   Code Status: Full Code   Admission status: Patient will be admitted as observation, with a less than 2 midnight length of stay. Level of care: Stepdown   Family Communication:  none at bedside  (The above findings and plan of care has been discussed with patient in detail, the patient expressed understanding and agreement of above plan)  --------------------------------------------------------------------------------------------------------------------------------------------------  Disposition Plan:  Anticipated 1-2 days Status is: Observation The patient remains OBS appropriate and will d/c before 2 midnights.     ----------------------------------------------------------------------------------------------------------------------------------------------------  Time spent: > than  8  Min.  Was spent seeing and evaluating the patient, reviewing all medical records, drawn plan of care.  SIGNED: Deatra James, MD, FHM. FAAFP. [place] [facility], Pager  (Please use [email or web address] to page/ or secure chat through epic) If 7PM-7AM, please contact night-coverage [email or web address],  03/12/2022, 4:07 PM

## 2022-03-12 NOTE — Assessment & Plan Note (Signed)
Continue Crestor 

## 2022-03-12 NOTE — ED Notes (Signed)
This RN unsuccessful at 2nd IV access. EDP made aware.

## 2022-03-12 NOTE — Assessment & Plan Note (Signed)
-   Chronic Home medication includes metoprolol and Eliquis - Currently in A-fib with RVR initiated Cardizem drip -Further evaluation recommendation per cardiology

## 2022-03-12 NOTE — Hospital Course (Signed)
Janice Curtis is a 82 year old female with extensive history of persistent A-fib on Eliquis, anxiety, history of colon cancer, history of CVA, HTN, HLD, obesity ... Presented with generalized weaknesses and palpitation. Denies any chest pain but reported her symptoms started last night after episode of nausea vomiting diarrhea.  Had 1 episode of nausea vomiting nonbloody.  2 episode of diarrhea since yesterday nonbloody.   ED course:  Heart rate as high as 177 currently on Cardizem drip -heart rate 78 Blood pressure 138/85, pulse 78, temperature 97.6 F (36.4 C),  resp. rate (!) 21, SpO2 99 %.  On room air  Labs CBC: Hemoglobin 15.1, CMP sodium 131, creatinine 1.53, TSH 2.16,  Respiratory panel: Influenza A/B, RSV, COVID.Marland Kitchen  All negative  Patient was given 10 mg of IV Cardizem bolus, started on Cardizem drip Cardiology was consulted

## 2022-03-12 NOTE — Consult Note (Signed)
CARDIOLOGY CONSULT NOTE    Patient ID: Janice Curtis; [id number]; 10/16/1940   Admit date: 03/12/2022 Date of Consult: 03/12/2022  Primary Care Provider: [facility] Primary Cardiologist: Dr Harrington Challenger Primary Electrophysiologist:  None   Patient Profile:   Janice Curtis is a 82 y.o. female with a hx of atrial fibrillation diagnosed in 2022, acute CVA, HTN, HLD who is being seen today for the evaluation of atrial flutter with RVR at the request of Dr Matilde Sprang.  History of Present Illness:   Janice Curtis is a 82 year old F known to have A-fib diagnosed in 2022, acute CVA, HTN, HLD was sent from the clinic for elevated heart rates. EKG showed atrial flutter with RVR. She was thought to be dehydrated and was given IVF with which the heart rates were adequately controlled briefly and she went into RVR again.  She denied having any palpitations however reported feeling extremely tired and SOB since discharge from the hospital in 11/23. She is not physically active at baseline. Her son and daughter helps her with daily activities.  Past Medical History:  Diagnosis Date   Anxiety    Colon cancer ([place])    Hypercholesterolemia    Hypertension    Obesity    Stroke ([place]) 12/05/2009    Past Surgical History:  Procedure Laterality Date   COLONOSCOPY  12/27/08   simple adenoma/inflammatory polyp at anastomosis   COLONOSCOPY  04/23/2011   Procedure: COLONOSCOPY;  Surgeon: Dorothyann Peng, MD;  [facility];  Service: Endoscopy;  Laterality: N/A;  10:00   IR CT HEAD LTD  12/09/2021   IR PERCUTANEOUS ART THROMBECTOMY/INFUSION INTRACRANIAL INC DIAG ANGIO  12/09/2021   RADIOLOGY WITH ANESTHESIA N/A 12/09/2021   Procedure: RADIOLOGY WITH ANESTHESIA;  Surgeon: Radiologist, Medication, MD;  Location: [place];  Service: Radiology;  Laterality: N/A;   RIGHT COLECTOMY  11/14/2007     Home Medications:  Prior to Admission medications   Medication Sig Start Date End Date Taking?  Authorizing Provider  apixaban (ELIQUIS) 5 MG TABS tablet Take 1 tablet (5 mg total) by mouth 2 (two) times daily. 01/03/22  Yes Gerlene Fee, NP  clopidogrel (PLAVIX) 75 MG tablet Take 75 mg by mouth daily. 03/06/22  Yes [provider]  metoprolol tartrate (LOPRESSOR) 25 MG tablet Take 1 tablet (25 mg total) by mouth 2 (two) times daily. 01/29/22  Yes Kathie Dike, MD  pantoprazole (PROTONIX) 40 MG tablet Take 1 tablet (40 mg total) by mouth at bedtime. 01/03/22  Yes Gerlene Fee, NP  potassium chloride SA (KLOR-CON M) 20 MEQ tablet Take 2 tablets (40 mEq total) by mouth daily. Patient taking differently: Take 40 mEq by mouth in the morning. 01/03/22  Yes Gerlene Fee, NP  rosuvastatin (CRESTOR) 40 MG tablet Place 1 tablet (40 mg total) into feeding tube daily. Patient taking differently: Take 40 mg by mouth daily. 01/03/22  Yes Gerlene Fee, NP  amLODipine (NORVASC) 5 MG tablet Take 1 tablet (5 mg total) by mouth daily. Patient not taking: Reported on 03/12/2022 01/03/22   Gerlene Fee, NP  diclofenac Sodium (VOLTAREN) 1 % GEL Apply 2 g topically in the morning and at bedtime. Patient not taking: Reported on 03/12/2022 01/03/22   Gerlene Fee, NP    Inpatient Medications: Scheduled Meds:  apixaban  5 mg Oral BID   clopidogrel  75 mg Oral Daily   metoprolol tartrate  25 mg Oral Once   pantoprazole  40 mg Oral QHS   rosuvastatin  40 mg Oral Daily   sodium chloride flush  3 mL Intravenous Q12H   Continuous Infusions:  sodium chloride     diltiazem (CARDIZEM) infusion 5 mg/hr (03/12/22 1355)   lactated ringers 75 mL/hr at 03/12/22 1425   PRN Meds: sodium chloride, acetaminophen **OR** acetaminophen, bisacodyl, hydrALAZINE, HYDROmorphone (DILAUDID) injection, ipratropium, levalbuterol, ondansetron **OR** ondansetron (ZOFRAN) IV, oxyCODONE, senna-docusate, sodium phosphate, traZODone  Allergies:   No Known Allergies  Social History:   Social History    Socioeconomic History   Marital status: Divorced    Spouse name: Not on file   Number of children: Not on file   Years of education: Not on file   Highest education level: Not on file  Occupational History   Not on file  Tobacco Use   Smoking status: Never   Smokeless tobacco: Never  Vaping Use   Vaping Use: Never used  Substance and Sexual Activity   Alcohol use: Not Currently   Drug use: No   Sexual activity: Yes  Other Topics Concern   Not on file  Social History Narrative   RAISES HER GRANDSON SINCE HE WAS 2 YO. HE IS 6 YO NOW.   Social Determinants of Health   Financial Resource Strain: Low Risk  (01/11/2022)   Overall Financial Resource Strain (CARDIA)    Difficulty of Paying Living Expenses: Not hard at all  Food Insecurity: No Food Insecurity (01/28/2022)   Hunger Vital Sign    Worried About Running Out of Food in the Last Year: Never true    Ran Out of Food in the Last Year: Never true  Transportation Needs: No Transportation Needs (01/28/2022)   PRAPARE - Hydrologist (Medical): No    Lack of Transportation (Non-Medical): No  Recent Concern: Transportation Needs - Unmet Transportation Needs (01/12/2022)   PRAPARE - Hydrologist (Medical): Yes    Lack of Transportation (Non-Medical): No  Physical Activity: Not on file  Stress: Not on file  Social Connections: Not on file  Intimate Partner Violence: Not At Risk (01/28/2022)   Humiliation, Afraid, Rape, and Kick questionnaire    Fear of Current or Ex-Partner: No    Emotionally Abused: No    Physically Abused: No    Sexually Abused: No    Family History:    Family History  Problem Relation Age of Onset   Heart attack Mother    Stroke Father      ROS:  Please see the history of present illness.  ROS  All other ROS reviewed and negative.     Physical Exam/Data:   Vitals:   03/12/22 1230 03/12/22 1300 03/12/22 1336 03/12/22 1430  BP: 137/66  (!) 152/73 (!) 149/99 138/85  Pulse: 72 99 (!) 159 78  Resp: (!) '21 20 17 '$ (!) 21  Temp:      TempSrc:      SpO2: 100% 99% 96% 99%   No intake or output data in the 24 hours ending 03/12/22 1455 There were no vitals filed for this visit. There is no height or weight on file to calculate BMI.  General:  Well nourished, well developed, in no acute distress HEENT: normal Lymph: no adenopathy Neck: no JVD Endocrine:  No thryomegaly Vascular: No carotid bruits; FA pulses 2+ bilaterally without bruits  Cardiac:  normal S1, S2; RRR; no murmur  Lungs:  clear to auscultation bilaterally, no wheezing, rhonchi or rales  Abd: soft, nontender, no hepatomegaly  Ext: no edema Musculoskeletal:  No deformities, BUE and BLE strength normal and equal Skin: warm and dry  Neuro:  CNs 2-12 intact, no focal abnormalities noted Psych:  Normal affect   EKG:  The EKG was personally reviewed and demonstrates:  AFlutter Telemetry:  Telemetry was personally reviewed and demonstrates:  Aflutter with RVR  Relevant CV Studies: Echo from 1023 LVEF normal Moderate TR Moderate AI  Laboratory Data:  Chemistry Recent Labs  Lab 03/12/22 1200  NA 131*  K 3.5  CL 98  CO2 16*  GLUCOSE 99  BUN 9  CREATININE 1.53*  CALCIUM 10.1  GFRNONAA 34*  ANIONGAP 17*    Recent Labs  Lab 03/12/22 1200  PROT 7.8  ALBUMIN 4.1  AST 36  ALT 18  ALKPHOS 65  BILITOT 1.2   Hematology Recent Labs  Lab 03/12/22 1149  WBC 5.8  RBC 5.88*  HGB 15.1*  HCT 47.1*  MCV 80.1  MCH 25.7*  MCHC 32.1  RDW 18.0*  PLT 162   Cardiac EnzymesNo results for input(s): "TROPONINI" in the last 168 hours. No results for input(s): "TROPIPOC" in the last 168 hours.  BNPNo results for input(s): "BNP", "PROBNP" in the last 168 hours.  DDimer No results for input(s): "DDIMER" in the last 168 hours.  Radiology/Studies:  DG Chest Port 1 View  Result Date: 03/12/2022 CLINICAL DATA:  Shortness of breath. Weakness. Atrial  fibrillation. EXAM: PORTABLE CHEST 1 VIEW COMPARISON:  01/28/2022 FINDINGS: Mild prominence of the main pulmonary artery which can be seen in the setting of pulmonary arterial hypertension. Overall heart size is with within normal limits. Atherosclerotic calcification of the aortic arch. Thoracic spondylosis. The lungs appear clear. IMPRESSION: 1. Mild prominence of the main pulmonary artery which can be seen in the setting of pulmonary arterial hypertension. 2. Atherosclerotic calcification of the aortic arch. 3. Thoracic spondylosis. Electronically Signed   By: Van Clines M.D.   On: 03/12/2022 12:21    Assessment and Plan:   Patient is a 82 year old F known to have A-fib on AC, HTN, HLD, CVA in 10/23 was sent from the clinic for elevated HR. EKG showed atrial flutter with RVR with variable AV block. Cardiology was consulted for the same.  # Atrial flutter with RVR, possibly atypical -s/p IVF administration, heart rate seem to be controlled but she had RVR again. Continue Cardizem drip at 5 mg/h. Hold p.o. metoprolol while she is on Cardizem drip. -Continue Eliquis 5 mg twice daily  # HTN, partially controlled -Hold home dose of amlodipine 5 mg as she is currently on Cardizem drip.  # Acute CVA in 10/23 -Continue Plavix 75 mg once daily and Eliquis 5 mg twice daily -Continue high intensity statin, rosuvastatin 40 mg nightly  # HLD -Continue rosuvastatin 40 mg nightly.  Goal LDL less than 70.  # Valvular heart disease, moderate TR and mild AI -Stable  I have spent a total of 55 minutes with patient reviewing chart , telemetry, EKGs, labs and examining patient as well as establishing an assessment and plan that was discussed with the patient.  > 50% of time was spent in direct patient care.      For questions or updates, please contact [place] Please consult [email or web address] for contact info under Cardiology/STEMI.   Signed, Vangie Bicker, MD 03/12/2022 2:55 PM

## 2022-03-13 DIAGNOSIS — I4892 Unspecified atrial flutter: Secondary | ICD-10-CM | POA: Diagnosis not present

## 2022-03-13 DIAGNOSIS — I10 Essential (primary) hypertension: Secondary | ICD-10-CM | POA: Diagnosis not present

## 2022-03-13 DIAGNOSIS — Z8673 Personal history of transient ischemic attack (TIA), and cerebral infarction without residual deficits: Secondary | ICD-10-CM

## 2022-03-13 DIAGNOSIS — I4891 Unspecified atrial fibrillation: Secondary | ICD-10-CM | POA: Diagnosis not present

## 2022-03-13 DIAGNOSIS — E7849 Other hyperlipidemia: Secondary | ICD-10-CM | POA: Diagnosis not present

## 2022-03-13 DIAGNOSIS — I484 Atypical atrial flutter: Secondary | ICD-10-CM | POA: Diagnosis not present

## 2022-03-13 LAB — CBC
HCT: 38.5 % (ref 36.0–46.0)
Hemoglobin: 12.4 g/dL (ref 12.0–15.0)
MCH: 25.5 pg — ABNORMAL LOW (ref 26.0–34.0)
MCHC: 32.2 g/dL (ref 30.0–36.0)
MCV: 79.2 fL — ABNORMAL LOW (ref 80.0–100.0)
Platelets: 133 10*3/uL — ABNORMAL LOW (ref 150–400)
RBC: 4.86 MIL/uL (ref 3.87–5.11)
RDW: 17 % — ABNORMAL HIGH (ref 11.5–15.5)
WBC: 3.9 10*3/uL — ABNORMAL LOW (ref 4.0–10.5)
nRBC: 0 % (ref 0.0–0.2)

## 2022-03-13 LAB — BASIC METABOLIC PANEL
Anion gap: 13 (ref 5–15)
BUN: 7 mg/dL — ABNORMAL LOW (ref 8–23)
CO2: 19 mmol/L — ABNORMAL LOW (ref 22–32)
Calcium: 9 mg/dL (ref 8.9–10.3)
Chloride: 102 mmol/L (ref 98–111)
Creatinine, Ser: 1.21 mg/dL — ABNORMAL HIGH (ref 0.44–1.00)
GFR, Estimated: 45 mL/min — ABNORMAL LOW (ref >60)
Glucose, Bld: 78 mg/dL (ref 70–99)
Potassium: 3 mmol/L — ABNORMAL LOW (ref 3.5–5.1)
Sodium: 134 mmol/L — ABNORMAL LOW (ref 135–145)

## 2022-03-13 LAB — MRSA NEXT GEN BY PCR, NASAL: MRSA by PCR Next Gen: NOT DETECTED

## 2022-03-13 LAB — GLUCOSE, CAPILLARY: Glucose-Capillary: 91 mg/dL (ref 70–99)

## 2022-03-13 LAB — APTT: aPTT: 35 seconds (ref 24–36)

## 2022-03-13 MED ORDER — POTASSIUM CHLORIDE 20 MEQ PO PACK
40.0000 meq | PACK | Freq: Once | ORAL | Status: AC
  Administered 2022-03-13: 40 meq via ORAL
  Filled 2022-03-13: qty 2

## 2022-03-13 MED ORDER — CHLORHEXIDINE GLUCONATE CLOTH 2 % EX PADS
6.0000 | MEDICATED_PAD | Freq: Every day | CUTANEOUS | Status: DC
  Administered 2022-03-13: 6 via TOPICAL

## 2022-03-13 MED ORDER — METOPROLOL SUCCINATE ER 50 MG PO TB24
50.0000 mg | ORAL_TABLET | Freq: Every day | ORAL | Status: DC
  Administered 2022-03-13: 50 mg via ORAL
  Filled 2022-03-13: qty 1

## 2022-03-13 MED ORDER — POTASSIUM CHLORIDE CRYS ER 20 MEQ PO TBCR
40.0000 meq | EXTENDED_RELEASE_TABLET | Freq: Once | ORAL | Status: AC
  Administered 2022-03-13: 40 meq via ORAL
  Filled 2022-03-13: qty 2

## 2022-03-13 MED ORDER — DILTIAZEM HCL 30 MG PO TABS
30.0000 mg | ORAL_TABLET | Freq: Three times a day (TID) | ORAL | Status: DC
  Administered 2022-03-13: 30 mg via ORAL
  Filled 2022-03-13: qty 1

## 2022-03-13 MED ORDER — DILTIAZEM HCL 30 MG PO TABS
60.0000 mg | ORAL_TABLET | Freq: Four times a day (QID) | ORAL | Status: DC
  Administered 2022-03-13 – 2022-03-14 (×4): 60 mg via ORAL
  Filled 2022-03-13: qty 2
  Filled 2022-03-13: qty 1
  Filled 2022-03-13 (×2): qty 2

## 2022-03-13 NOTE — Evaluation (Signed)
Occupational Therapy Evaluation Patient Details Name: Janice Curtis MRN: [id number] DOB: 05/14/1940 Today's Date: 03/13/2022   History of Present Illness Janice Curtis is a 82 year old female with extensive history of persistent A-fib on Eliquis, anxiety, history of colon cancer, history of CVA, HTN, HLD, obesity ... Presented with generalized weaknesses and palpitation.  Denies any chest pain but reported her symptoms started last night after episode of nausea vomiting diarrhea.  Had 1 episode of nausea vomiting nonbloody.  2 episode of diarrhea since yesterday nonbloody.   Clinical Impression   Pt agreeable to OT and PT co-evaluation. Pt demonstrates generalized B UE weakness and need for mild assist for bed mobility. To transfer to chair pt required min G to min A due to slow labored movement. Gait and balance seemed to improve slightly during ambulation in room and hall. Pt is recommended for home health with assist as needed at home from family. Pt will benefit from continued OT in the hospital and recommended venue below to increase strength, balance, and endurance for safe ADL's.         Recommendations for follow up therapy are one component of a multi-disciplinary discharge planning process, led by the attending physician.  Recommendations may be updated based on patient status, additional functional criteria and insurance authorization.   Follow Up Recommendations  Home health OT     Assistance Recommended at Discharge Intermittent Supervision/Assistance  Patient can return home with the following A little help with walking and/or transfers;A little help with bathing/dressing/bathroom;Assistance with cooking/housework;Assist for transportation    Functional Status Assessment  Patient has had a recent decline in their functional status and demonstrates the ability to make significant improvements in function in a reasonable and predictable amount of time.  Equipment  Recommendations  None recommended by OT    Recommendations for Other Services       Precautions / Restrictions Precautions Precautions: Fall Restrictions Weight Bearing Restrictions: No      Mobility Bed Mobility Overal bed mobility: Needs Assistance Bed Mobility: Supine to Sit     Supine to sit: Min assist     General bed mobility comments: Single hand held assist to pull to sit; slow labored movement.    Transfers Overall transfer level: Needs assistance Equipment used: Rolling walker (2 wheels) Transfers: Sit to/from Stand, Bed to chair/wheelchair/BSC Sit to Stand: Min guard, Min assist     Step pivot transfers: Min guard, Min assist     General transfer comment: Slow labored movement; unsteady for initial step pivot; improved mobility during ambulation.      Balance Overall balance assessment: Needs assistance Sitting-balance support: No upper extremity supported, Feet supported Sitting balance-Leahy Scale: Good Sitting balance - Comments: seated EOB   Standing balance support: Bilateral upper extremity supported, During functional activity, Reliant on assistive device for balance Standing balance-Leahy Scale: Fair Standing balance comment: using RW                           ADL either performed or assessed with clinical judgement   ADL Overall ADL's : Needs assistance/impaired     Grooming: Min guard;Standing   Upper Body Bathing: Set up;Sitting   Lower Body Bathing: Sitting/lateral leans;Minimal assistance;Min guard   Upper Body Dressing : Set up;Sitting   Lower Body Dressing: Min guard;Set up;Sitting/lateral leans Lower Body Dressing Details (indicate cue type and reason): Pt able to doff and don sock seated in recliner. Toilet Transfer: Ambulation;Rolling walker (2  wheels);Min guard;Minimal assistance Toilet Transfer Details (indicate cue type and reason): Simulated via EOB to chair transfer with use of RW Toileting- Clothing  Manipulation and Hygiene: Minimal assistance;Sitting/lateral lean;Min guard       Functional mobility during ADLs: Min guard;Rolling walker (2 wheels) General ADL Comments: Pt able to ambulate in hall and room with min G assist.     Vision Baseline Vision/History: 1 Wears glasses Ability to See in Adequate Light: 0 Adequate Patient Visual Report: No change from baseline Vision Assessment?: No apparent visual deficits                Pertinent Vitals/Pain Pain Assessment Pain Assessment: No/denies pain     Hand Dominance Right   Extremity/Trunk Assessment Upper Extremity Assessment Upper Extremity Assessment: Generalized weakness   Lower Extremity Assessment Lower Extremity Assessment: Defer to PT evaluation   Cervical / Trunk Assessment Cervical / Trunk Assessment: Normal   Communication Communication Communication: No difficulties   Cognition Arousal/Alertness: Awake/alert Behavior During Therapy: WFL for tasks assessed/performed Overall Cognitive Status: Within Functional Limits for tasks assessed                                                        Home Living Family/patient expects to be discharged to:: Private residence Living Arrangements: Children Available Help at Discharge: Family;Available PRN/intermittently Type of Home: House Home Access: Level entry     Home Layout: One level     Bathroom Shower/Tub: Occupational psychologist: Handicapped height Bathroom Accessibility: Yes How Accessible: Accessible via walker Home Equipment: [place] (2 wheels);Grab bars - tub/shower   Additional Comments: Pt reported no change in living history from recent admission.      Prior Functioning/Environment Prior Level of Function : Independent/Modified Independent             Mobility Comments: houshold ambulation with RW ADLs Comments: Assited for dressing and IADL's. Independent for other ADL's per pt.         OT Problem List: Impaired balance (sitting and/or standing)      OT Treatment/Interventions: Self-care/ADL training;Therapeutic exercise;Therapeutic activities;Balance training;Patient/family education    OT Goals(Current goals can be found in the care plan section) Acute Rehab OT Goals Patient Stated Goal: return home OT Goal Formulation: With patient Time For Goal Achievement: 03/27/22 Potential to Achieve Goals: Good  OT Frequency: Min 1X/week    Co-evaluation PT/OT/SLP Co-Evaluation/Treatment: Yes Reason for Co-Treatment: To address functional/ADL transfers   OT goals addressed during session: ADL's and self-care      AM-PAC OT "6 Clicks" Daily Activity     Outcome Measure Help from another person eating meals?: None Help from another person taking care of personal grooming?: A Little Help from another person toileting, which includes using toliet, bedpan, or urinal?: A Little Help from another person bathing (including washing, rinsing, drying)?: A Little Help from another person to put on and taking off regular upper body clothing?: None Help from another person to put on and taking off regular lower body clothing?: A Little 6 Click Score: 20   End of Session Equipment Utilized During Treatment: Rolling walker (2 wheels)  Activity Tolerance: Patient tolerated treatment well Patient left: in chair;with call bell/phone within reach  OT Visit Diagnosis: Unsteadiness on feet (R26.81);Other abnormalities of gait and mobility (R26.89)  Time: 7026-3785 OT Time Calculation (min): 18 min Charges:  OT General Charges $OT Visit: 1 Visit OT Evaluation $OT Eval Low Complexity: 1 Low  Tarah Buboltz OT, MOT   Larey Seat 03/13/2022, 11:27 AM

## 2022-03-13 NOTE — Plan of Care (Signed)
  Problem: Acute Rehab PT Goals(only PT should resolve) Goal: Pt Will Go Supine/Side To Sit Outcome: Progressing Flowsheets (Taken 03/13/2022 1433) Pt will go Supine/Side to Sit:  with modified independence  with supervision Goal: Patient Will Transfer Sit To/From Stand Outcome: Progressing Flowsheets (Taken 03/13/2022 1433) Patient will transfer sit to/from stand: with supervision Goal: Pt Will Transfer Bed To Chair/Chair To Bed Outcome: Progressing Flowsheets (Taken 03/13/2022 1433) Pt will Transfer Bed to Chair/Chair to Bed: with supervision Goal: Pt Will Ambulate Outcome: Progressing Flowsheets (Taken 03/13/2022 1433) Pt will Ambulate:  100 feet  with supervision  with rolling walker   2:33 PM, 03/13/22 Lonell Grandchild, MPT Physical Therapist with [facility] [phone] office [phone] mobile phone

## 2022-03-13 NOTE — Progress Notes (Signed)
Report called and given to [facility], LPN on Dept. 103. Pt to be wheeled up via WC to room 311.

## 2022-03-13 NOTE — Progress Notes (Signed)
Cardizem gtt stopped at 1021. Pt ordered PO Metoprolol and Cardizem, given an hour prior. Will continue to monitor HR and BP.

## 2022-03-13 NOTE — Evaluation (Signed)
Physical Therapy Evaluation Patient Details Name: Janice Curtis MRN: [id number] DOB: [birth year] Today's Date: 03/13/2022  History of Present Illness  Janice Curtis is a 82 year old female with extensive history of persistent A-fib on Eliquis, anxiety, history of colon cancer, history of CVA, HTN, HLD, obesity ... Presented with generalized weaknesses and palpitation.  Denies any chest pain but reported her symptoms started last night after episode of nausea vomiting diarrhea.  Had 1 episode of nausea vomiting nonbloody.  2 episode of diarrhea since yesterday nonbloody.   Clinical Impression  Patient demonstrates slow labored movement for sitting up at bedside requiring increased time for scooting to EOB, unsteady on feet when transferring to chair without AD, required use of RW for safety and tolerated ambulating in room/hallway without loss of balance, but SpO2 dropped from 98% to 84% without c/o SOB, once seated in chair SpO2 increased to 99% - RN notified.  Patient tolerated sitting up in chair after therapy.  Patient will benefit from continued skilled physical therapy in hospital and recommended venue below to increase strength, balance, endurance for safe ADLs and gait.          Recommendations for follow up therapy are one component of a multi-disciplinary discharge planning process, led by the attending physician.  Recommendations may be updated based on patient status, additional functional criteria and insurance authorization.  Follow Up Recommendations Home health PT      Assistance Recommended at Discharge Set up Supervision/Assistance  Patient can return home with the following  A little help with walking and/or transfers;A little help with bathing/dressing/bathroom;Help with stairs or ramp for entrance;Assistance with cooking/housework    Equipment Recommendations None recommended by PT  Recommendations for Other Services       Functional Status Assessment Patient has had  a recent decline in their functional status and demonstrates the ability to make significant improvements in function in a reasonable and predictable amount of time.     Precautions / Restrictions Precautions Precautions: Fall Restrictions Weight Bearing Restrictions: No      Mobility  Bed Mobility Overal bed mobility: Needs Assistance Bed Mobility: Supine to Sit     Supine to sit: Min assist     General bed mobility comments: increased time, labored movement    Transfers Overall transfer level: Needs assistance Equipment used: Rolling walker (2 wheels) Transfers: Sit to/from Stand, Bed to chair/wheelchair/BSC Sit to Stand: Min guard, Min assist   Step pivot transfers: Min guard, Min assist       General transfer comment: unsteady labored movement having to lean on armrest of chair for support when not using RW    Ambulation/Gait Ambulation/Gait assistance: Min guard, Min assist Gait Distance (Feet): 60 Feet Assistive device: Rolling walker (2 wheels) Gait Pattern/deviations: Decreased step length - right, Decreased step length - left, Decreased stride length Gait velocity: decreased     General Gait Details: slow labored cadence without loss of balance using RW, on room air with SpO2 dropping from 98% to 84%, no c/o SOB and limited mostly due to fatigue  Stairs            Wheelchair Mobility    Modified Rankin (Stroke Patients Only)       Balance Overall balance assessment: Needs assistance Sitting-balance support: Feet supported, No upper extremity supported Sitting balance-Leahy Scale: Good Sitting balance - Comments: seated EOB   Standing balance support: Bilateral upper extremity supported, During functional activity, Reliant on assistive device for balance Standing balance-Leahy Scale: [place] Standing  balance comment: using RW                             Pertinent Vitals/Pain Pain Assessment Pain Assessment: No/denies pain     Home Living Family/patient expects to be discharged to:: Private residence Living Arrangements: Children Available Help at Discharge: Family;Available PRN/intermittently Type of Home: House Home Access: Level entry       Home Layout: One level Home Equipment: Rolling Walker (2 wheels);Grab bars - tub/shower Additional Comments: Pt reported no change in living history from recent admission.    Prior Function Prior Level of Function : Independent/Modified Independent             Mobility Comments: houshold ambulation with RW ADLs Comments: Assited for dressing and IADL's. Independent for other ADL's per pt.     Hand Dominance   Dominant Hand: Right    Extremity/Trunk Assessment   Upper Extremity Assessment Upper Extremity Assessment: Defer to OT evaluation    Lower Extremity Assessment Lower Extremity Assessment: Generalized weakness    Cervical / Trunk Assessment Cervical / Trunk Assessment: Normal  Communication   Communication: No difficulties  Cognition Arousal/Alertness: Awake/alert Behavior During Therapy: WFL for tasks assessed/performed, Anxious Overall Cognitive Status: Within Functional Limits for tasks assessed                                          General Comments      Exercises     Assessment/Plan    PT Assessment Patient needs continued PT services  PT Problem List Decreased strength;Decreased activity tolerance;Decreased balance;Decreased mobility       PT Treatment Interventions DME instruction;Gait training;Stair training;Functional mobility training;Therapeutic activities;Therapeutic exercise;Patient/family education;Balance training    PT Goals (Current goals can be found in the Care Plan section)  Acute Rehab PT Goals Patient Stated Goal: return home with family to assist PT Goal Formulation: With patient Time For Goal Achievement: 03/20/22 Potential to Achieve Goals: Good    Frequency Min 3X/week      Co-evaluation PT/OT/SLP Co-Evaluation/Treatment: Yes Reason for Co-Treatment: To address functional/ADL transfers PT goals addressed during session: Mobility/safety with mobility;Balance;Proper use of DME OT goals addressed during session: ADL's and self-care       AM-PAC PT "6 Clicks" Mobility  Outcome Measure Help needed turning from your back to your side while in a flat bed without using bedrails?: None Help needed moving from lying on your back to sitting on the side of a flat bed without using bedrails?: A Little Help needed moving to and from a bed to a chair (including a wheelchair)?: A Little Help needed standing up from a chair using your arms (e.g., wheelchair or bedside chair)?: A Little Help needed to walk in hospital room?: A Little Help needed climbing 3-5 steps with a railing? : A Lot 6 Click Score: 18    End of Session   Activity Tolerance: Patient tolerated treatment well;Patient limited by fatigue Patient left: in chair;with call bell/phone within reach Nurse Communication: Mobility status PT Visit Diagnosis: Unsteadiness on feet (R26.81);Other abnormalities of gait and mobility (R26.89);Muscle weakness (generalized) (M62.81)    Time: 2130-8657 PT Time Calculation (min) (ACUTE ONLY): 20 min   Charges:   PT Evaluation $PT Eval Moderate Complexity: 1 Mod PT Treatments $Therapeutic Activity: 8-22 mins        2:32 PM, 03/13/22  Lonell Grandchild, MPT Physical Therapist with [facility] [phone] office [phone] mobile phone

## 2022-03-13 NOTE — Progress Notes (Signed)
PROGRESS NOTE    Patient: Janice Curtis                            PCP: [facility]                    DOB: May 31, 1940            DOA: 03/12/2022 [id number]             DOS: 03/13/2022, 12:48 PM   LOS: 0 days   Date of Service: The patient was seen and examined on 03/13/2022  Subjective:   The patient was seen and examined this morning. Hemodynamically stable. Rate much improved on Cardizem drip No issues overnight .  Denies any chest pain Denies of having any further diarrhea nausea or vomiting  Brief Narrative:   Janice Curtis is a 82 year old female with extensive history of persistent A-fib on Eliquis, anxiety, history of colon cancer, history of CVA, HTN, HLD, obesity ... Presented with generalized weaknesses and palpitation. Denies any chest pain but reported her symptoms started last night after episode of nausea vomiting diarrhea.  Had 1 episode of nausea vomiting nonbloody.  2 episode of diarrhea since yesterday nonbloody.   ED course:  Heart rate as high as 177 currently on Cardizem drip -heart rate 78 Blood pressure 138/85, pulse 78, temperature 97.6 F (36.4 C),  resp. rate (!) 21, SpO2 99 %.  On room air  Labs CBC: Hemoglobin 15.1, CMP sodium 131, creatinine 1.53, TSH 2.16,  Respiratory panel: Influenza A/B, RSV, COVID.Marland Kitchen  All negative  Patient was given 10 mg of IV Cardizem bolus, started on Cardizem drip Cardiology was consulted      Assessment & Plan:   Principal Problem:   Atrial fibrillation with rapid ventricular response ([place]) Active Problems:   Acute renal failure (HCC)   Essential hypertension   Hyperlipidemia   GERD without esophagitis   Obesity due to excess calories with serious comorbidity   Normochromic anemia   Persistent atrial fibrillation (HCC)   History of CVA (cerebrovascular accident)     Assessment and Plan: * Atrial fibrillation with rapid ventricular response ([place]) -In A-fib with RVR -Still on  Cardizem drip rate much improved -Cardiology transitioning to p.o. Cardizem 60 mg p.o. every 6 hours, but holding metoprolol  -Further workup and treatment per cardiology -Continue home dose Eliquis, and metoprolol  Acute renal failure ([place]) Acute versus chronic-improving Lab Results  Component Value Date   CREATININE 1.21 (H) 03/13/2022   CREATININE 1.53 (H) 03/12/2022   CREATININE 0.96 01/29/2022     History of CVA (cerebrovascular accident) -Continue home medication Eliquis, statins, -Also on Plavix (?  Need for Plavix-with PCP, neurology as an outpatient)  Persistent atrial fibrillation ([place]) - Chronic Home medication includes metoprolol and Eliquis - Currently in A-fib with RVR initiated Cardizem drip -Further evaluation recommendation per cardiology  Normochromic anemia Monitoring H&H closely     Latest Ref Rng & Units 03/13/2022    4:19 AM 03/12/2022   11:49 AM 01/29/2022    6:20 AM  CBC  WBC 4.0 - 10.5 K/uL 3.9  5.8  3.5   Hemoglobin 12.0 - 15.0 g/dL 12.4  15.1  13.1   Hematocrit 36.0 - 46.0 % 38.5  47.1  40.2   Platelets 150 - 400 K/uL 133  162  172      Obesity due to excess calories with serious comorbidity There  is no height or weight on file to calculate BMI. Height 5 3, last weight 73 kg  GERD without esophagitis - Continue Protonix  Hyperlipidemia Continue Crestor  Essential hypertension - Blood pressure stable -monitoring closely with Cardizem drip -Transitioning off Cardizem -Holding metoprolol and Norvasc  -As needed hydralazine   ---------------------------------------------------------------------------------------------------------------------  DVT prophylaxis:  TED hose Start: 03/12/22 1420 SCDs Start: 03/12/22 1420 apixaban (ELIQUIS) tablet 5 mg   Code Status:   Code Status: Full Code  Family Communication: No family member present at bedside- attempt will be made to update daily The above findings and plan of care has been  discussed with patient (and family)  in detail,  they expressed understanding and agreement of above. -Advance care planning has been discussed.   Admission status:   Status is: Observation The patient remains OBS appropriate and will d/c before 2 midnights.   Disposition: From  - home             Planning for discharge in 1-2 days: to   Procedures:   No admission procedures for hospital encounter.   Antimicrobials:  Anti-infectives (From admission, onward)    None        Medication:   apixaban  5 mg Oral BID   Chlorhexidine Gluconate Cloth  6 each Topical Daily   clopidogrel  75 mg Oral Daily   diltiazem  60 mg Oral Q6H   pantoprazole  40 mg Oral QHS   rosuvastatin  40 mg Oral Daily   sodium chloride flush  3 mL Intravenous Q12H    sodium chloride, acetaminophen **OR** acetaminophen, bisacodyl, hydrALAZINE, HYDROmorphone (DILAUDID) injection, ipratropium, levalbuterol, ondansetron **OR** ondansetron (ZOFRAN) IV, oxyCODONE, senna-docusate, sodium phosphate, traZODone   Objective:   Vitals:   03/13/22 1000 03/13/22 1100 03/13/22 1108 03/13/22 1200  BP: 118/73 104/82  126/68  Pulse: 81 76  78  Resp: '20 19  17  '$ Temp:   97.8 F (36.6 C)   TempSrc:   Oral   SpO2: 99% 99%  99%  Weight:      Height:        Intake/Output Summary (Last 24 hours) at 03/13/2022 1248 Last data filed at 03/13/2022 1022 Gross per 24 hour  Intake 1742.77 ml  Output 500 ml  Net 1242.77 ml   Filed Weights   03/12/22 1703 03/13/22 0500  Weight: 66.4 kg 67.5 kg     Physical examination:   Constitution:  Alert, cooperative, no distress,  Appears calm and comfortable  Psychiatric:   Normal and stable mood and affect, cognition intact,   HEENT:        Normocephalic, PERRL, otherwise with in Normal limits  Chest:         Chest symmetric Cardio vascular: Irregularly irregular, no murmure, No Rubs or Gallops  pulmonary: Clear to auscultation bilaterally, respirations unlabored, negative  wheezes / crackles Abdomen: Soft, non-tender, non-distended, bowel sounds,no masses, no organomegaly Muscular skeletal: Limited exam - in bed, able to move all 4 extremities,   Neuro: CNII-XII intact. , normal motor and sensation, reflexes intact  Extremities: No pitting edema lower extremities, +2 pulses  Skin: Dry, warm to touch, negative for any Rashes, No open wounds Wounds: per nursing documentation   ------------------------------------------------------------------------------------------------------------------------------------------    LABs:     Latest Ref Rng & Units 03/13/2022    4:19 AM 03/12/2022   11:49 AM 01/29/2022    6:20 AM  CBC  WBC 4.0 - 10.5 K/uL 3.9  5.8  3.5   Hemoglobin  12.0 - 15.0 g/dL 12.4  15.1  13.1   Hematocrit 36.0 - 46.0 % 38.5  47.1  40.2   Platelets 150 - 400 K/uL 133  162  172       Latest Ref Rng & Units 03/13/2022    4:19 AM 03/12/2022   12:00 PM 01/29/2022    6:20 AM  CMP  Glucose 70 - 99 mg/dL 78  99  100   BUN 8 - 23 mg/dL '7  9  7   '$ Creatinine 0.44 - 1.00 mg/dL 1.21  1.53  0.96   Sodium 135 - 145 mmol/L 134  131  134   Potassium 3.5 - 5.1 mmol/L 3.0  3.5  4.2   Chloride 98 - 111 mmol/L 102  98  106   CO2 22 - 32 mmol/L '19  16  21   '$ Calcium 8.9 - 10.3 mg/dL 9.0  10.1  8.9   Total Protein 6.5 - 8.1 g/dL  7.8  6.7   Total Bilirubin 0.3 - 1.2 mg/dL  1.2  0.8   Alkaline Phos 38 - 126 U/L  65  65   AST 15 - 41 U/L  36  31   ALT 0 - 44 U/L  18  21        Micro Results Recent Results (from the past 240 hour(s))  Resp panel by RT-PCR (RSV, Flu A&B, Covid) Anterior Nasal Swab     Status: None   Collection Time: 03/12/22  1:32 PM   Specimen: Anterior Nasal Swab  Result Value Ref Range Status   SARS Coronavirus 2 by RT PCR NEGATIVE NEGATIVE Final    Comment: (NOTE) SARS-CoV-2 target nucleic acids are NOT DETECTED.  The SARS-CoV-2 RNA is generally detectable in upper respiratory specimens during the acute phase of infection. The  lowest concentration of SARS-CoV-2 viral copies this assay can detect is 138 copies/mL. A negative result does not preclude SARS-Cov-2 infection and should not be used as the sole basis for treatment or other patient management decisions. A negative result may occur with  improper specimen collection/handling, submission of specimen other than nasopharyngeal swab, presence of viral mutation(s) within the areas targeted by this assay, and inadequate number of viral copies(<138 copies/mL). A negative result must be combined with clinical observations, patient history, and epidemiological information. The expected result is Negative.  Fact Sheet for Patients:  EntrepreneurPulse.com.au  Fact Sheet for Healthcare Providers:  IncredibleEmployment.be  This test is no t yet approved or cleared by the [place] FDA and  has been authorized for detection and/or diagnosis of SARS-CoV-2 by FDA under an Emergency Use Authorization (EUA). This EUA will remain  in effect (meaning this test can be used) for the duration of the COVID-19 declaration under Section 564(b)(1) of the Act, 21 U.S.C.section 360bbb-3(b)(1), unless the authorization is terminated  or revoked sooner.       Influenza A by PCR NEGATIVE NEGATIVE Final   Influenza B by PCR NEGATIVE NEGATIVE Final    Comment: (NOTE) The Xpert Xpress SARS-CoV-2/FLU/RSV plus assay is intended as an aid in the diagnosis of influenza from Nasopharyngeal swab specimens and should not be used as a sole basis for treatment. Nasal washings and aspirates are unacceptable for Xpert Xpress SARS-CoV-2/FLU/RSV testing.  Fact Sheet for Patients: EntrepreneurPulse.com.au  Fact Sheet for Healthcare Providers: IncredibleEmployment.be  This test is not yet approved or cleared by the [place] FDA and has been authorized for detection and/or diagnosis of SARS-CoV-2 by FDA under  an Emergency Use Authorization (EUA). This EUA will remain in effect (meaning this test can be used) for the duration of the COVID-19 declaration under Section 564(b)(1) of the Act, 21 U.S.C. section 360bbb-3(b)(1), unless the authorization is terminated or revoked.     Resp Syncytial Virus by PCR NEGATIVE NEGATIVE Final    Comment: (NOTE) Fact Sheet for Patients: EntrepreneurPulse.com.au  Fact Sheet for Healthcare Providers: IncredibleEmployment.be  This test is not yet approved or cleared by the [place] FDA and has been authorized for detection and/or diagnosis of SARS-CoV-2 by FDA under an Emergency Use Authorization (EUA). This EUA will remain in effect (meaning this test can be used) for the duration of the COVID-19 declaration under Section 564(b)(1) of the Act, 21 U.S.C. section 360bbb-3(b)(1), unless the authorization is terminated or revoked.  Performed at [facility], [address], [place], [place] [address]   MRSA Next Gen by PCR, Nasal     Status: None   Collection Time: 03/12/22  5:17 PM   Specimen: Nasal Mucosa; Nasal Swab  Result Value Ref Range Status   MRSA by PCR Next Gen NOT DETECTED NOT DETECTED Final    Comment: (NOTE) The GeneXpert MRSA Assay (FDA approved for NASAL specimens only), is one component of a comprehensive MRSA colonization surveillance program. It is not intended to diagnose MRSA infection nor to guide or monitor treatment for MRSA infections. Test performance is not FDA approved in patients less than 37 years old. Performed at [facility], [address], [place], [place] [address]     Radiology Reports No results found.  SIGNED: Deatra James, MD, FHM. FAAFP. Zacarias Pontes - [facility] Time spent > 35 min.  In seeing, evaluating and examining the patient. Reviewing medical records, labs, drawn plan of care. [facility],  Pager (please use [email or web address] to page/ text) Please use  Epic Secure Chat for non-urgent communication (7AM-7PM)  If 7PM-7AM, please contact night-coverage [email or web address], 03/13/2022, 12:48 PM

## 2022-03-13 NOTE — TOC Initial Note (Signed)
Transition of Care [facility]) - Initial/Assessment Note    Patient Details  Name: Janice Curtis MRN: [id number] Date of Birth: 1941-02-13  [facility]) CM/SW Contact:    Shade Flood, LCSW Phone Number: 03/13/2022, 12:54 PM  Clinical Narrative:                  Pt admitted from home. Received TOC consult for HH/DME needs. Spoke with pt to assess. Per pt, she lives with her son at home. She has DME as needed. Per pt, she is active with [facility] PT at this time. [place] agency is [facility] and pt elects to continue with them at dc. OT here also recommending HH OT at dc. Updated MD and requested [facility] PT/OT orders at dc.  Updated Santiago Glad at [employer]. Will follow and assist with dc planning.  Expected Discharge Plan: [place] Barriers to Discharge: Continued Medical Work up   Patient Goals and CMS Choice Patient states their goals for this hospitalization and ongoing recovery are:: go home CMS Medicare.gov Compare Post Acute Care list provided to:: Patient        Expected Discharge Plan and Services In-house Referral: Clinical Social Work   Post Acute Care Choice: Resumption of Svcs/PTA Provider Living arrangements for the past 2 months: Single Family Home                                      Prior Living Arrangements/Services Living arrangements for the past 2 months: Single Family Home Lives with:: Adult Children Patient language and need for interpreter reviewed:: Yes Do you feel safe going back to the place where you live?: Yes      Need for Family Participation in Patient Care: Yes (Comment) Care giver support system in place?: Yes (comment) Current home services: DME Criminal Activity/Legal Involvement Pertinent to Current Situation/Hospitalization: No - Comment as needed  Activities of Daily Living Home Assistive Devices/Equipment: Walker (specify type) ADL Screening (condition at time of admission) Patient's cognitive ability adequate to  safely complete daily activities?: Yes Is the patient deaf or have difficulty hearing?: No Does the patient have difficulty seeing, even when wearing glasses/contacts?: No Does the patient have difficulty concentrating, remembering, or making decisions?: No Patient able to express need for assistance with ADLs?: Yes Does the patient have difficulty dressing or bathing?: No Independently performs ADLs?: No Does the patient have difficulty walking or climbing stairs?: Yes Weakness of Legs: Both Weakness of Arms/Hands: None  Permission Sought/Granted                  Emotional Assessment   Attitude/Demeanor/Rapport: Engaged Affect (typically observed): Pleasant Orientation: : Oriented to Self, Oriented to Place, Oriented to  Time, Oriented to Situation Alcohol / Substance Use: Not Applicable Psych Involvement: No (comment)  Admission diagnosis:  A-fib ([place]) [I48.91] Atrial fibrillation with rapid ventricular response ([place]) [I48.91] Atrial fibrillation, unspecified type [facility]) [I48.91] Patient Active Problem List   Diagnosis Date Noted   Atrial fibrillation with rapid ventricular response ([place]) 03/12/2022   History of CVA (cerebrovascular accident) 03/12/2022   Persistent atrial fibrillation ([place]) 01/28/2022   Hypotension 01/28/2022   UTI (urinary tract infection) 01/28/2022   Nausea and vomiting 01/28/2022   Acute renal failure ([place]) 01/01/2022   Hypokalemia 01/01/2022   GERD without esophagitis 12/21/2021   Obesity due to excess calories with serious comorbidity 12/21/2021   Normochromic anemia 12/21/2021  Hyperlipidemia 12/15/2021   Dysphagia due to recent stroke 12/15/2021   Middle cerebral artery embolism, left 12/09/2021   Essential hypertension 12/08/2008   History of malignant neoplasm of large intestine 12/08/2008   PCP:  Pllc, [place]:   [id number] [place] [id number]: [phone]: [phone]    Social Determinants of Health (SDOH) Social History: [place] No Food Insecurity (03/12/2022)  Housing: Low Risk  (03/12/2022)  Transportation Needs: No Transportation Needs (03/12/2022)  Recent Concern: Transportation Needs - Unmet Transportation Needs (01/12/2022)  Utilities: Not At Risk (03/12/2022)  Financial Resource Strain: Low Risk  (01/11/2022)  Tobacco Use: Low Risk  (03/12/2022)   SDOH Interventions:     Readmission Risk Interventions     No data to display

## 2022-03-13 NOTE — Plan of Care (Signed)
  Problem: Acute Rehab OT Goals (only OT should resolve) Goal: Pt. Will Perform Grooming Flowsheets (Taken 03/13/2022 1129) Pt Will Perform Grooming:  with modified independence  standing Goal: Pt. Will Perform Lower Body Dressing Flowsheets (Taken 03/13/2022 1129) Pt Will Perform Lower Body Dressing:  with modified independence  sitting/lateral leans  sit to/from stand Goal: Pt. Will Transfer To Toilet Flowsheets (Taken 03/13/2022 1129) Pt Will Transfer to Toilet:  with modified independence  ambulating Goal: Pt/Caregiver Will Perform Home Exercise Program Flowsheets (Taken 03/13/2022 1129) Pt/caregiver will Perform Home Exercise Program:  Increased strength  Both right and left upper extremity  Independently  Lavaughn Bisig OT, MOT

## 2022-03-13 NOTE — Progress Notes (Signed)
Progress Note  Patient Name: Janice Curtis Date of Encounter: 03/13/2022  Primary Cardiologist: Dorris Carnes, MD  Subjective   No acute events overnight.  No symptoms.  Inpatient Medications    Scheduled Meds:  apixaban  5 mg Oral BID   Chlorhexidine Gluconate Cloth  6 each Topical Daily   clopidogrel  75 mg Oral Daily   diltiazem  30 mg Oral Q8H   metoprolol succinate  50 mg Oral Daily   pantoprazole  40 mg Oral QHS   rosuvastatin  40 mg Oral Daily   sodium chloride flush  3 mL Intravenous Q12H   Continuous Infusions:  sodium chloride     lactated ringers 75 mL/hr at 03/13/22 1022   PRN Meds: sodium chloride, acetaminophen **OR** acetaminophen, bisacodyl, hydrALAZINE, HYDROmorphone (DILAUDID) injection, ipratropium, levalbuterol, ondansetron **OR** ondansetron (ZOFRAN) IV, oxyCODONE, senna-docusate, sodium phosphate, traZODone   Vital Signs    Vitals:   03/13/22 0909 03/13/22 1000 03/13/22 1100 03/13/22 1108  BP: 119/71 118/73 104/82   Pulse: 79 81 76   Resp: '18 20 19   '$ Temp:    97.8 F (36.6 C)  TempSrc:    Oral  SpO2: 97% 99% 99%   Weight:      Height:        Intake/Output Summary (Last 24 hours) at 03/13/2022 1147 Last data filed at 03/13/2022 1022 Gross per 24 hour  Intake 1742.77 ml  Output 500 ml  Net 1242.77 ml   Filed Weights   03/12/22 1703 03/13/22 0500  Weight: 66.4 kg 67.5 kg    Telemetry     Personally reviewed, continues to be in atrial flutter on HR controlled  ECG    Atrial flutter with RVR  Physical Exam   GEN: No acute distress.   Neck: No JVD. Cardiac: RRR, no murmur, rub, or gallop.  Respiratory: Nonlabored. Clear to auscultation bilaterally. GI: Soft, nontender, bowel sounds present. MS: No edema; No deformity. Neuro:  Nonfocal. Psych: Alert and oriented x 3. Normal affect.  Labs    Chemistry Recent Labs  Lab 03/12/22 1200 03/13/22 0419  NA 131* 134*  K 3.5 3.0*  CL 98 102  CO2 16* 19*  GLUCOSE 99 78  BUN 9  7*  CREATININE 1.53* 1.21*  CALCIUM 10.1 9.0  PROT 7.8  --   ALBUMIN 4.1  --   AST 36  --   ALT 18  --   ALKPHOS 65  --   BILITOT 1.2  --   GFRNONAA 34* 45*  ANIONGAP 17* 13     Hematology Recent Labs  Lab 03/12/22 1149 03/13/22 0419  WBC 5.8 3.9*  RBC 5.88* 4.86  HGB 15.1* 12.4  HCT 47.1* 38.5  MCV 80.1 79.2*  MCH 25.7* 25.5*  MCHC 32.1 32.2  RDW 18.0* 17.0*  PLT 162 133*    Cardiac EnzymesNo results for input(s): "TROPONINIHS" in the last 720 hours.  BNPNo results for input(s): "BNP", "PROBNP" in the last 168 hours.   DDimerNo results for input(s): "DDIMER" in the last 168 hours.   Radiology    DG Chest Port 1 View  Result Date: 03/12/2022 CLINICAL DATA:  Shortness of breath. Weakness. Atrial fibrillation. EXAM: PORTABLE CHEST 1 VIEW COMPARISON:  01/28/2022 FINDINGS: Mild prominence of the main pulmonary artery which can be seen in the setting of pulmonary arterial hypertension. Overall heart size is with within normal limits. Atherosclerotic calcification of the aortic arch. Thoracic spondylosis. The lungs appear clear. IMPRESSION: 1. Mild prominence of  the main pulmonary artery which can be seen in the setting of pulmonary arterial hypertension. 2. Atherosclerotic calcification of the aortic arch. 3. Thoracic spondylosis. Electronically Signed   By: Van Clines M.D.   On: 03/12/2022 12:21    Patient profile   Patient is 82 year old F known to have A-fib diagnosed in 2022, acute CVA in 10/23, HTN, HLD was sent from the clinic for elevated heart rates. EKG showed atrial flutter with RVR.  Assessment & Plan    # Atrial flutter with RVR, possibly atypical -Start p.o. Cardizem 60 mg every 6 hours and wean off the diltiazem drip. Hold metoprolol. -Continue Eliquis 5 mg twice daily  # HTN, controlled -Home dose of amlodipine was held due to concurrent Cardizem use  # Acute CVA in 10/23 -Continue Plavix 75 mg once daily and Eliquis 5 mg twice daily -Continue  high intensity statin, rosuvastatin 40 mg nightly  # HLD -Continue rosuvastatin 40 mg nightly.  Goal LDL less than 70.  # Valvular heart disease, moderate TR and mild AI -Stable  I have spent a total of 33 minutes with patient reviewing chart , telemetry, EKGs, labs and examining patient as well as establishing an assessment and plan that was discussed with the patient.  > 50% of time was spent in direct patient care.     Signed, Chalmers Guest, MD  03/13/2022, 11:47 AM

## 2022-03-14 DIAGNOSIS — I4892 Unspecified atrial flutter: Secondary | ICD-10-CM | POA: Diagnosis not present

## 2022-03-14 DIAGNOSIS — Z8673 Personal history of transient ischemic attack (TIA), and cerebral infarction without residual deficits: Secondary | ICD-10-CM | POA: Diagnosis not present

## 2022-03-14 DIAGNOSIS — I4891 Unspecified atrial fibrillation: Secondary | ICD-10-CM | POA: Diagnosis not present

## 2022-03-14 DIAGNOSIS — E785 Hyperlipidemia, unspecified: Secondary | ICD-10-CM | POA: Diagnosis not present

## 2022-03-14 DIAGNOSIS — D649 Anemia, unspecified: Secondary | ICD-10-CM | POA: Diagnosis not present

## 2022-03-14 DIAGNOSIS — K219 Gastro-esophageal reflux disease without esophagitis: Secondary | ICD-10-CM | POA: Diagnosis not present

## 2022-03-14 DIAGNOSIS — N179 Acute kidney failure, unspecified: Secondary | ICD-10-CM | POA: Diagnosis not present

## 2022-03-14 DIAGNOSIS — E7849 Other hyperlipidemia: Secondary | ICD-10-CM | POA: Diagnosis not present

## 2022-03-14 DIAGNOSIS — I484 Atypical atrial flutter: Secondary | ICD-10-CM | POA: Diagnosis not present

## 2022-03-14 DIAGNOSIS — Z79899 Other long term (current) drug therapy: Secondary | ICD-10-CM | POA: Diagnosis not present

## 2022-03-14 DIAGNOSIS — Z1152 Encounter for screening for COVID-19: Secondary | ICD-10-CM | POA: Diagnosis not present

## 2022-03-14 DIAGNOSIS — I10 Essential (primary) hypertension: Secondary | ICD-10-CM | POA: Diagnosis not present

## 2022-03-14 LAB — BASIC METABOLIC PANEL
Anion gap: 11 (ref 5–15)
BUN: 8 mg/dL (ref 8–23)
CO2: 19 mmol/L — ABNORMAL LOW (ref 22–32)
Calcium: 9 mg/dL (ref 8.9–10.3)
Chloride: 102 mmol/L (ref 98–111)
Creatinine, Ser: 1.19 mg/dL — ABNORMAL HIGH (ref 0.44–1.00)
GFR, Estimated: 46 mL/min — ABNORMAL LOW (ref >60)
Glucose, Bld: 82 mg/dL (ref 70–99)
Potassium: 3.3 mmol/L — ABNORMAL LOW (ref 3.5–5.1)
Sodium: 132 mmol/L — ABNORMAL LOW (ref 135–145)

## 2022-03-14 LAB — CBC
HCT: 38.1 % (ref 36.0–46.0)
Hemoglobin: 12 g/dL (ref 12.0–15.0)
MCH: 25.4 pg — ABNORMAL LOW (ref 26.0–34.0)
MCHC: 31.5 g/dL (ref 30.0–36.0)
MCV: 80.7 fL (ref 80.0–100.0)
Platelets: 133 10*3/uL — ABNORMAL LOW (ref 150–400)
RBC: 4.72 MIL/uL (ref 3.87–5.11)
RDW: 17.2 % — ABNORMAL HIGH (ref 11.5–15.5)
WBC: 3.8 10*3/uL — ABNORMAL LOW (ref 4.0–10.5)
nRBC: 0 % (ref 0.0–0.2)

## 2022-03-14 LAB — GLUCOSE, CAPILLARY: Glucose-Capillary: 121 mg/dL — ABNORMAL HIGH (ref 70–99)

## 2022-03-14 MED ORDER — POTASSIUM CHLORIDE CRYS ER 20 MEQ PO TBCR
40.0000 meq | EXTENDED_RELEASE_TABLET | Freq: Once | ORAL | Status: AC
  Administered 2022-03-14: 40 meq via ORAL
  Filled 2022-03-14: qty 2

## 2022-03-14 MED ORDER — DILTIAZEM HCL ER 240 MG PO TB24: 240.0000 mg | ORAL_TABLET | Freq: Every day | ORAL | 1 refills | Status: DC

## 2022-03-14 NOTE — TOC Transition Note (Signed)
Transition of Care [facility]) - CM/SW Discharge Note   Patient Details  Name: Janice Curtis MRN: [id number] Date of Birth: 14-Jul-1940  Transition of Care [facility]) CM/SW Contact:  Shade Flood, LCSW Phone Number: 03/14/2022, 11:05 AM   Clinical Narrative:     Pt stable for dc home with [facility] PT/OT today per MD. Updated Santiago Glad at [facility]. They will follow up with pt at home.  There are no other TOC needs for dc.  Final next level of care: Home w Home Health Services Barriers to Discharge: Barriers Resolved   Patient Goals and CMS Choice CMS Medicare.gov Compare Post Acute Care list provided to:: Patient Choice offered to / list presented to : Patient  Discharge Placement                         Discharge Plan and Services Additional resources added to the After Visit Summary for   In-house Referral: Clinical Social Work   Post Acute Care Choice: Resumption of Svcs/PTA Provider                    HH Arranged: OT          Social Determinants of Health (SDOH) Interventions [place]: No Food Insecurity (03/12/2022)  Housing: Low Risk  (03/12/2022)  Transportation Needs: No Transportation Needs (03/12/2022)  Recent Concern: Transportation Needs - Unmet Transportation Needs (01/12/2022)  Utilities: Not At Risk (03/12/2022)  Financial Resource Strain: Low Risk  (01/11/2022)  Tobacco Use: Low Risk  (03/12/2022)     Readmission Risk Interventions     No data to display

## 2022-03-14 NOTE — Progress Notes (Signed)
Progress Note  Patient Name: Janice Curtis Date of Encounter: 03/14/2022  Primary Cardiologist: Dorris Carnes, MD  Subjective   No acute events overnight.  No symptoms.  Inpatient Medications    Scheduled Meds:  apixaban  5 mg Oral BID   clopidogrel  75 mg Oral Daily   diltiazem  60 mg Oral Q6H   pantoprazole  40 mg Oral QHS   rosuvastatin  40 mg Oral Daily   sodium chloride flush  3 mL Intravenous Q12H   Continuous Infusions:  sodium chloride     PRN Meds: sodium chloride, acetaminophen **OR** acetaminophen, hydrALAZINE, HYDROmorphone (DILAUDID) injection, ipratropium, levalbuterol, ondansetron **OR** ondansetron (ZOFRAN) IV, oxyCODONE, senna-docusate, traZODone   Vital Signs    Vitals:   03/13/22 2357 03/14/22 0145 03/14/22 0500 03/14/22 0540  BP: 123/78 119/75  125/88  Pulse: 76 76  79  Resp: '20 18  14  '$ Temp: 98.4 F (36.9 C) 98.3 F (36.8 C)  97.8 F (36.6 C)  TempSrc: Oral Oral  Oral  SpO2:  97%  98%  Weight:   67.7 kg   Height:        Intake/Output Summary (Last 24 hours) at 03/14/2022 1016 Last data filed at 03/13/2022 1700 Gross per 24 hour  Intake 320.53 ml  Output 400 ml  Net -79.47 ml   Filed Weights   03/12/22 1703 03/13/22 0500 03/14/22 0500  Weight: 66.4 kg 67.5 kg 67.7 kg    Telemetry     Personally reviewed, continues to be in atrial flutter on HR controlled  ECG    Atrial flutter with RVR  Physical Exam   GEN: No acute distress.   Neck: No JVD. Cardiac: RRR, no murmur, rub, or gallop.  Respiratory: Nonlabored. Clear to auscultation bilaterally. GI: Soft, nontender, bowel sounds present. MS: No edema; No deformity. Neuro:  Nonfocal. Psych: Alert and oriented x 3. Normal affect.  Labs    Chemistry Recent Labs  Lab 03/12/22 1200 03/13/22 0419 03/14/22 0327  NA 131* 134* 132*  K 3.5 3.0* 3.3*  CL 98 102 102  CO2 16* 19* 19*  GLUCOSE 99 78 82  BUN 9 7* 8  CREATININE 1.53* 1.21* 1.19*  CALCIUM 10.1 9.0 9.0  PROT 7.8   --   --   ALBUMIN 4.1  --   --   AST 36  --   --   ALT 18  --   --   ALKPHOS 65  --   --   BILITOT 1.2  --   --   GFRNONAA 34* 45* 46*  ANIONGAP 17* 13 11     Hematology Recent Labs  Lab 03/12/22 1149 03/13/22 0419 03/14/22 0327  WBC 5.8 3.9* 3.8*  RBC 5.88* 4.86 4.72  HGB 15.1* 12.4 12.0  HCT 47.1* 38.5 38.1  MCV 80.1 79.2* 80.7  MCH 25.7* 25.5* 25.4*  MCHC 32.1 32.2 31.5  RDW 18.0* 17.0* 17.2*  PLT 162 133* 133*    Cardiac EnzymesNo results for input(s): "TROPONINIHS" in the last 720 hours.  BNPNo results for input(s): "BNP", "PROBNP" in the last 168 hours.   DDimerNo results for input(s): "DDIMER" in the last 168 hours.   Radiology    DG Chest Port 1 View  Result Date: 03/12/2022 CLINICAL DATA:  Shortness of breath. Weakness. Atrial fibrillation. EXAM: PORTABLE CHEST 1 VIEW COMPARISON:  01/28/2022 FINDINGS: Mild prominence of the main pulmonary artery which can be seen in the setting of pulmonary arterial hypertension. Overall heart size is  with within normal limits. Atherosclerotic calcification of the aortic arch. Thoracic spondylosis. The lungs appear clear. IMPRESSION: 1. Mild prominence of the main pulmonary artery which can be seen in the setting of pulmonary arterial hypertension. 2. Atherosclerotic calcification of the aortic arch. 3. Thoracic spondylosis. Electronically Signed   By: Van Clines M.D.   On: 03/12/2022 12:21    Patient profile   Patient is 82 year old F known to have A-fib diagnosed in 2022, acute CVA in 10/23, HTN, HLD was sent from the clinic for elevated heart rates. EKG showed atrial flutter with RVR.  Assessment & Plan    # Atrial flutter with RVR, possibly atypical and currently rate controlled. Continues to be in atrial flutter. # History of atrial fibrillation -Heart rate is very well-controlled on Cardizem 60 mg every 6 hours. Switch to Cardizem CD 240 mg once daily upon discharge. -Continue Eliquis 5 mg twice daily -Obtain  EKG today.  # HTN, controlled -Patient was on amlodipine at home and will need to discontinue upon discharge. Continue Cardizem CD 240 mg once daily upon discharge (this is mainly for rate control and has antihypertensive effect as well). Goal BP less than 140/90 mmHg.  # Acute CVA in 10/23 -Continue Plavix 75 mg once daily (Plavix recommendations will be deferred to neurology). She has an upcoming appointment with neurology on 08/2022. -Continue high intensity statin, rosuvastatin 40 mg nightly.  # HLD -Continue rosuvastatin 40 mg nightly.  Goal LDL less than 70.  # Valvular heart disease, moderate TR and mild AI -Stable  [facility] will sign off.   Medication Recommendations: Continue Cardizem CD 240 mg once daily, Eliquis 5 mg twice daily, Plavix 75 mg once daily, rosuvastatin 40 mg nightly. Discontinue home amlodipine upon discharge. Other recommendations (labs, testing, etc): None Follow up as an outpatient: Follow-up with cardiology with APP or Dr. Harrington Challenger in 1 month upon discharge.  I have spent a total of 32 minutes with patient reviewing chart , telemetry, EKGs, labs and examining patient as well as establishing an assessment and plan that was discussed with the patient.  > 50% of time was spent in direct patient care.     Signed, Chalmers Guest, MD  03/14/2022, 10:16 AM

## 2022-03-14 NOTE — Discharge Summary (Signed)
Physician Discharge Summary  Janice Curtis UQJ:[id number] DOB: 1940-07-23 DOA: 03/12/2022  PCP: [facility]  Admit date: 03/12/2022  Discharge date: 03/14/2022  Admitted From:Home  Disposition:  Home  Recommendations for Outpatient Follow-up:  Follow up with PCP in 1-2 weeks Follow-up with cardiology and 4 weeks as recommended and appointment will be set Continue Cardizem CD 240 mg daily as well as Plavix 75 mg daily, Eliquis 5 mg twice daily, and rosuvastatin 40 mg daily Continue other home medications as prior  Home Health: Yes with PT/OT  Equipment/Devices: None  Discharge Condition:Stable  CODE STATUS: Full  Diet recommendation: Heart Healthy  Brief/Interim Summary: Janice Curtis is a 82 year old female with extensive history of persistent A-fib on Eliquis, anxiety, history of colon cancer, history of CVA, HTN, HLD, obesity ... Presented with generalized weaknesses and palpitation.  She was noted to have atrial flutter with RVR with history of atrial fibrillation.  She was seen by cardiology and heart rates are now well-controlled on Cardizem 60 mg every 6 hours.  She will be switched to Cardizem CD 240 mg once daily and will continue Eliquis 5 mg twice daily for anticoagulation.  She is now in stable condition for discharge with no other acute events or concerns noted.  She was seen by physical therapy with recommendations for home health PT and OT which will be set up.  Discharge Diagnoses:  Principal Problem:   Atrial fibrillation with rapid ventricular response (HCC) Active Problems:   Acute renal failure (HCC)   Essential hypertension   Hyperlipidemia   GERD without esophagitis   Obesity due to excess calories with serious comorbidity   Normochromic anemia   Persistent atrial fibrillation (HCC)   History of CVA (cerebrovascular accident)  Principal discharge diagnosis: Atrial flutter with RVR possibly typical in the setting of atrial  fibrillation.  Discharge Instructions  Discharge Instructions     Diet - low sodium heart healthy   Complete by: As directed    Increase activity slowly   Complete by: As directed       Allergies as of 03/14/2022   No Known Allergies      Medication List     STOP taking these medications    amLODipine 5 MG tablet Commonly known as: NORVASC   metoprolol tartrate 25 MG tablet Commonly known as: LOPRESSOR       TAKE these medications    apixaban 5 MG Tabs tablet Commonly known as: ELIQUIS Take 1 tablet (5 mg total) by mouth 2 (two) times daily.   clopidogrel 75 MG tablet Commonly known as: PLAVIX Take 75 mg by mouth daily.   diclofenac Sodium 1 % Gel Commonly known as: Voltaren Apply 2 g topically in the morning and at bedtime.   diltiazem 240 MG 24 hr tablet Commonly known as: Cardizem LA Take 1 tablet (240 mg total) by mouth daily.   pantoprazole 40 MG tablet Commonly known as: PROTONIX Take 1 tablet (40 mg total) by mouth at bedtime.   potassium chloride SA 20 MEQ tablet Commonly known as: KLOR-CON M Take 2 tablets (40 mEq total) by mouth daily. What changed: when to take this   rosuvastatin 40 MG tablet Commonly known as: CRESTOR Place 1 tablet (40 mg total) into feeding tube daily. What changed: how to take this        Follow-up Information     [facility]. Schedule an appointment as soon as possible for a visit in 1 week(s).  Specialty: Family Medicine Contact information: [place] [address] [phone]                No Known Allergies  Consultations: Cardiology   Procedures/Studies: [facility] Chest Port 1 View  Result Date: 03/12/2022 CLINICAL DATA:  Shortness of breath. Weakness. Atrial fibrillation. EXAM: PORTABLE CHEST 1 VIEW COMPARISON:  01/28/2022 FINDINGS: Mild prominence of the main pulmonary artery which can be seen in the setting of pulmonary arterial hypertension.  Overall heart size is with within normal limits. Atherosclerotic calcification of the aortic arch. Thoracic spondylosis. The lungs appear clear. IMPRESSION: 1. Mild prominence of the main pulmonary artery which can be seen in the setting of pulmonary arterial hypertension. 2. Atherosclerotic calcification of the aortic arch. 3. Thoracic spondylosis. Electronically Signed   By: Van Clines M.D.   On: 03/12/2022 12:21     Discharge Exam: Vitals:   03/14/22 0145 03/14/22 0540  BP: 119/75 125/88  Pulse: 76 79  Resp: 18 14  Temp: 98.3 F (36.8 C) 97.8 F (36.6 C)  SpO2: 97% 98%   Vitals:   03/13/22 2357 03/14/22 0145 03/14/22 0500 03/14/22 0540  BP: 123/78 119/75  125/88  Pulse: 76 76  79  Resp: '20 18  14  '$ Temp: 98.4 F (36.9 C) 98.3 F (36.8 C)  97.8 F (36.6 C)  TempSrc: Oral Oral  Oral  SpO2:  97%  98%  Weight:   67.7 kg   Height:        General: Pt is alert, awake, not in acute distress Cardiovascular: Irregular, S1/S2 +, no rubs, no gallops Respiratory: CTA bilaterally, no wheezing, no rhonchi Abdominal: Soft, NT, ND, bowel sounds + Extremities: no edema, no cyanosis    The results of significant diagnostics from this hospitalization (including imaging, microbiology, ancillary and laboratory) are listed below for reference.     Microbiology: Recent Results (from the past 240 hour(s))  Resp panel by RT-PCR (RSV, Flu A&B, Covid) Anterior Nasal Swab     Status: None   Collection Time: 03/12/22  1:32 PM   Specimen: Anterior Nasal Swab  Result Value Ref Range Status   SARS Coronavirus 2 by RT PCR NEGATIVE NEGATIVE Final    Comment: (NOTE) SARS-CoV-2 target nucleic acids are NOT DETECTED.  The SARS-CoV-2 RNA is generally detectable in upper respiratory specimens during the acute phase of infection. The lowest concentration of SARS-CoV-2 viral copies this assay can detect is 138 copies/mL. A negative result does not preclude SARS-Cov-2 infection and should not be  used as the sole basis for treatment or other patient management decisions. A negative result may occur with  improper specimen collection/handling, submission of specimen other than nasopharyngeal swab, presence of viral mutation(s) within the areas targeted by this assay, and inadequate number of viral copies(<138 copies/mL). A negative result must be combined with clinical observations, patient history, and epidemiological information. The expected result is Negative.  Fact Sheet for Patients:  EntrepreneurPulse.com.au  Fact Sheet for Healthcare Providers:  IncredibleEmployment.be  This test is no t yet approved or cleared by the [place] FDA and  has been authorized for detection and/or diagnosis of SARS-CoV-2 by FDA under an Emergency Use Authorization (EUA). This EUA will remain  in effect (meaning this test can be used) for the duration of the COVID-19 declaration under Section 564(b)(1) of the Act, 21 U.S.C.section 360bbb-3(b)(1), unless the authorization is terminated  or revoked sooner.       Influenza A by PCR NEGATIVE  NEGATIVE Final   Influenza B by PCR NEGATIVE NEGATIVE Final    Comment: (NOTE) The Xpert Xpress SARS-CoV-2/FLU/RSV plus assay is intended as an aid in the diagnosis of influenza from Nasopharyngeal swab specimens and should not be used as a sole basis for treatment. Nasal washings and aspirates are unacceptable for Xpert Xpress SARS-CoV-2/FLU/RSV testing.  Fact Sheet for Patients: EntrepreneurPulse.com.au  Fact Sheet for Healthcare Providers: IncredibleEmployment.be  This test is not yet approved or cleared by the [place] FDA and has been authorized for detection and/or diagnosis of SARS-CoV-2 by FDA under an Emergency Use Authorization (EUA). This EUA will remain in effect (meaning this test can be used) for the duration of the COVID-19 declaration under Section  564(b)(1) of the Act, 21 U.S.C. section 360bbb-3(b)(1), unless the authorization is terminated or revoked.     Resp Syncytial Virus by PCR NEGATIVE NEGATIVE Final    Comment: (NOTE) Fact Sheet for Patients: EntrepreneurPulse.com.au  Fact Sheet for Healthcare Providers: IncredibleEmployment.be  This test is not yet approved or cleared by the [place] FDA and has been authorized for detection and/or diagnosis of SARS-CoV-2 by FDA under an Emergency Use Authorization (EUA). This EUA will remain in effect (meaning this test can be used) for the duration of the COVID-19 declaration under Section 564(b)(1) of the Act, 21 U.S.C. section 360bbb-3(b)(1), unless the authorization is terminated or revoked.  Performed at [facility], [address], [place], [place] [address]   MRSA Next Gen by PCR, Nasal     Status: None   Collection Time: 03/12/22  5:17 PM   Specimen: Nasal Mucosa; Nasal Swab  Result Value Ref Range Status   MRSA by PCR Next Gen NOT DETECTED NOT DETECTED Final    Comment: (NOTE) The GeneXpert MRSA Assay (FDA approved for NASAL specimens only), is one component of a comprehensive MRSA colonization surveillance program. It is not intended to diagnose MRSA infection nor to guide or monitor treatment for MRSA infections. Test performance is not FDA approved in patients less than 52 years old. Performed at [facility], [address] [place], [place] [address]      Labs: BNP (last 3 results) No results for input(s): "BNP" in the last 8760 hours. Basic Metabolic Panel: Recent Labs  Lab 03/12/22 1149 03/12/22 1200 03/13/22 0419 03/14/22 0327  NA  --  131* 134* 132*  K  --  3.5 3.0* 3.3*  CL  --  98 102 102  CO2  --  16* 19* 19*  GLUCOSE  --  99 78 82  BUN  --  9 7* 8  CREATININE  --  1.53* 1.21* 1.19*  CALCIUM  --  10.1 9.0 9.0  MG 2.0  --   --   --   PHOS 3.5  --   --   --    Liver Function Tests: Recent Labs  Lab  03/12/22 1200  AST 36  ALT 18  ALKPHOS 65  BILITOT 1.2  PROT 7.8  ALBUMIN 4.1   No results for input(s): "LIPASE", "AMYLASE" in the last 168 hours. No results for input(s): "AMMONIA" in the last 168 hours. CBC: Recent Labs  Lab 03/12/22 1149 03/13/22 0419 03/14/22 0327  WBC 5.8 3.9* 3.8*  HGB 15.1* 12.4 12.0  HCT 47.1* 38.5 38.1  MCV 80.1 79.2* 80.7  PLT 162 133* 133*   Cardiac Enzymes: No results for input(s): "CKTOTAL", "CKMB", "CKMBINDEX", "TROPONINI" in the last 168 hours. BNP: Invalid input(s): "POCBNP" CBG: Recent Labs  Lab  03/13/22 0848 03/14/22 0811  GLUCAP 91 121*   D-Dimer No results for input(s): "DDIMER" in the last 72 hours. Hgb A1c No results for input(s): "HGBA1C" in the last 72 hours. Lipid Profile No results for input(s): "CHOL", "HDL", "LDLCALC", "TRIG", "CHOLHDL", "LDLDIRECT" in the last 72 hours. Thyroid function studies No results for input(s): "TSH", "T4TOTAL", "T3FREE", "THYROIDAB" in the last 72 hours.  Invalid input(s): "FREET3" Anemia work up No results for input(s): "VITAMINB12", "FOLATE", "FERRITIN", "TIBC", "IRON", "RETICCTPCT" in the last 72 hours. Urinalysis    Component Value Date/Time   COLORURINE STRAW (A) 01/28/2022 1029   APPEARANCEUR CLEAR 01/28/2022 1029   LABSPEC 1.009 01/28/2022 1029   PHURINE 7.0 01/28/2022 1029   GLUCOSEU NEGATIVE 01/28/2022 1029   HGBUR MODERATE (A) 01/28/2022 1029   BILIRUBINUR NEGATIVE 01/28/2022 1029   KETONESUR NEGATIVE 01/28/2022 1029   PROTEINUR 30 (A) 01/28/2022 1029   UROBILINOGEN 0.2 11/11/2011 1026   NITRITE NEGATIVE 01/28/2022 1029   LEUKOCYTESUR TRACE (A) 01/28/2022 1029   Sepsis Labs Recent Labs  Lab 03/12/22 1149 03/13/22 0419 03/14/22 0327  WBC 5.8 3.9* 3.8*   Microbiology Recent Results (from the past 240 hour(s))  Resp panel by RT-PCR (RSV, Flu A&B, Covid) Anterior Nasal Swab     Status: None   Collection Time: 03/12/22  1:32 PM   Specimen: Anterior Nasal Swab   Result Value Ref Range Status   SARS Coronavirus 2 by RT PCR NEGATIVE NEGATIVE Final    Comment: (NOTE) SARS-CoV-2 target nucleic acids are NOT DETECTED.  The SARS-CoV-2 RNA is generally detectable in upper respiratory specimens during the acute phase of infection. The lowest concentration of SARS-CoV-2 viral copies this assay can detect is 138 copies/mL. A negative result does not preclude SARS-Cov-2 infection and should not be used as the sole basis for treatment or other patient management decisions. A negative result may occur with  improper specimen collection/handling, submission of specimen other than nasopharyngeal swab, presence of viral mutation(s) within the areas targeted by this assay, and inadequate number of viral copies(<138 copies/mL). A negative result must be combined with clinical observations, patient history, and epidemiological information. The expected result is Negative.  Fact Sheet for Patients:  EntrepreneurPulse.com.au  Fact Sheet for Healthcare Providers:  IncredibleEmployment.be  This test is no t yet approved or cleared by the [place] FDA and  has been authorized for detection and/or diagnosis of SARS-CoV-2 by FDA under an Emergency Use Authorization (EUA). This EUA will remain  in effect (meaning this test can be used) for the duration of the COVID-19 declaration under Section 564(b)(1) of the Act, 21 U.S.C.section 360bbb-3(b)(1), unless the authorization is terminated  or revoked sooner.       Influenza A by PCR NEGATIVE NEGATIVE Final   Influenza B by PCR NEGATIVE NEGATIVE Final    Comment: (NOTE) The Xpert Xpress SARS-CoV-2/FLU/RSV plus assay is intended as an aid in the diagnosis of influenza from Nasopharyngeal swab specimens and should not be used as a sole basis for treatment. Nasal washings and aspirates are unacceptable for Xpert Xpress SARS-CoV-2/FLU/RSV testing.  Fact Sheet for  Patients: EntrepreneurPulse.com.au  Fact Sheet for Healthcare Providers: IncredibleEmployment.be  This test is not yet approved or cleared by the [place] FDA and has been authorized for detection and/or diagnosis of SARS-CoV-2 by FDA under an Emergency Use Authorization (EUA). This EUA will remain in effect (meaning this test can be used) for the duration of the COVID-19 declaration under Section 564(b)(1) of the Act, 21 U.S.C.  section 360bbb-3(b)(1), unless the authorization is terminated or revoked.     Resp Syncytial Virus by PCR NEGATIVE NEGATIVE Final    Comment: (NOTE) Fact Sheet for Patients: EntrepreneurPulse.com.au  Fact Sheet for Healthcare Providers: IncredibleEmployment.be  This test is not yet approved or cleared by the [place] FDA and has been authorized for detection and/or diagnosis of SARS-CoV-2 by FDA under an Emergency Use Authorization (EUA). This EUA will remain in effect (meaning this test can be used) for the duration of the COVID-19 declaration under Section 564(b)(1) of the Act, 21 U.S.C. section 360bbb-3(b)(1), unless the authorization is terminated or revoked.  Performed at [facility], [address], [place], [place] [address]   MRSA Next Gen by PCR, Nasal     Status: None   Collection Time: 03/12/22  5:17 PM   Specimen: Nasal Mucosa; Nasal Swab  Result Value Ref Range Status   MRSA by PCR Next Gen NOT DETECTED NOT DETECTED Final    Comment: (NOTE) The GeneXpert MRSA Assay (FDA approved for NASAL specimens only), is one component of a comprehensive MRSA colonization surveillance program. It is not intended to diagnose MRSA infection nor to guide or monitor treatment for MRSA infections. Test performance is not FDA approved in patients less than 52 years old. Performed at [facility], [address] [place], [place] [address]      Time coordinating discharge:  35 minutes  SIGNED:   Rodena Goldmann, DO [facility] 03/14/2022, 10:57 AM  If 7PM-7AM, please contact night-coverage [email or web address]

## 2022-03-26 ENCOUNTER — Other Ambulatory Visit: Payer: Self-pay

## 2022-03-26 DIAGNOSIS — I4891 Unspecified atrial fibrillation: Secondary | ICD-10-CM | POA: Diagnosis not present

## 2022-03-26 DIAGNOSIS — E663 Overweight: Secondary | ICD-10-CM | POA: Diagnosis not present

## 2022-03-26 DIAGNOSIS — I639 Cerebral infarction, unspecified: Secondary | ICD-10-CM | POA: Diagnosis not present

## 2022-03-26 NOTE — Patient Outreach (Signed)
First telephone outreach attempt to obtain mRS. No answer. Left message for returned call.  Raimundo Corbit [facility] Management Assistant [phone]  

## 2022-03-27 ENCOUNTER — Other Ambulatory Visit: Payer: Self-pay

## 2022-03-27 NOTE — Patient Outreach (Signed)
Second telephone outreach attempt to obtain mRS. No answer. Left message for returned call. Called daughter as well she answered but was at work and not able to answer on pt behalf.  Philmore Pali [facility] Management Assistant [phone]

## 2022-03-29 ENCOUNTER — Other Ambulatory Visit: Payer: Self-pay

## 2022-03-29 NOTE — Patient Outreach (Signed)
3 outreach attempts were completed to obtain mRs. mRs could not be obtained because patient never returned my calls. mRs=7    Janice Curtis Care Management Assistant [phone]  

## 2022-04-14 ENCOUNTER — Other Ambulatory Visit: Payer: Self-pay | Admitting: Adult Health

## 2022-05-08 DIAGNOSIS — I639 Cerebral infarction, unspecified: Secondary | ICD-10-CM | POA: Diagnosis not present

## 2022-05-08 DIAGNOSIS — I69351 Hemiplegia and hemiparesis following cerebral infarction affecting right dominant side: Secondary | ICD-10-CM | POA: Diagnosis not present

## 2022-05-08 DIAGNOSIS — I10 Essential (primary) hypertension: Secondary | ICD-10-CM | POA: Diagnosis not present

## 2022-05-08 DIAGNOSIS — I361 Nonrheumatic tricuspid (valve) insufficiency: Secondary | ICD-10-CM | POA: Diagnosis not present

## 2022-05-08 DIAGNOSIS — E782 Mixed hyperlipidemia: Secondary | ICD-10-CM | POA: Diagnosis not present

## 2022-05-08 DIAGNOSIS — Z6825 Body mass index (BMI) 25.0-25.9, adult: Secondary | ICD-10-CM | POA: Diagnosis not present

## 2022-05-08 DIAGNOSIS — E559 Vitamin D deficiency, unspecified: Secondary | ICD-10-CM | POA: Diagnosis not present

## 2022-05-08 DIAGNOSIS — I4891 Unspecified atrial fibrillation: Secondary | ICD-10-CM | POA: Diagnosis not present

## 2022-05-08 DIAGNOSIS — R5383 Other fatigue: Secondary | ICD-10-CM | POA: Diagnosis not present

## 2022-05-08 DIAGNOSIS — R7309 Other abnormal glucose: Secondary | ICD-10-CM | POA: Diagnosis not present

## 2022-06-09 IMAGING — MG DIGITAL SCREENING BILAT W/ TOMO W/ CAD
8 series · 8 of 24 positions shown · non-contrast
Comparison: Previous exam(s).

CLINICAL DATA: Screening.

EXAM:
DIGITAL SCREENING BILATERAL MAMMOGRAM WITH TOMO AND CAD

[R MLO synth-2D]
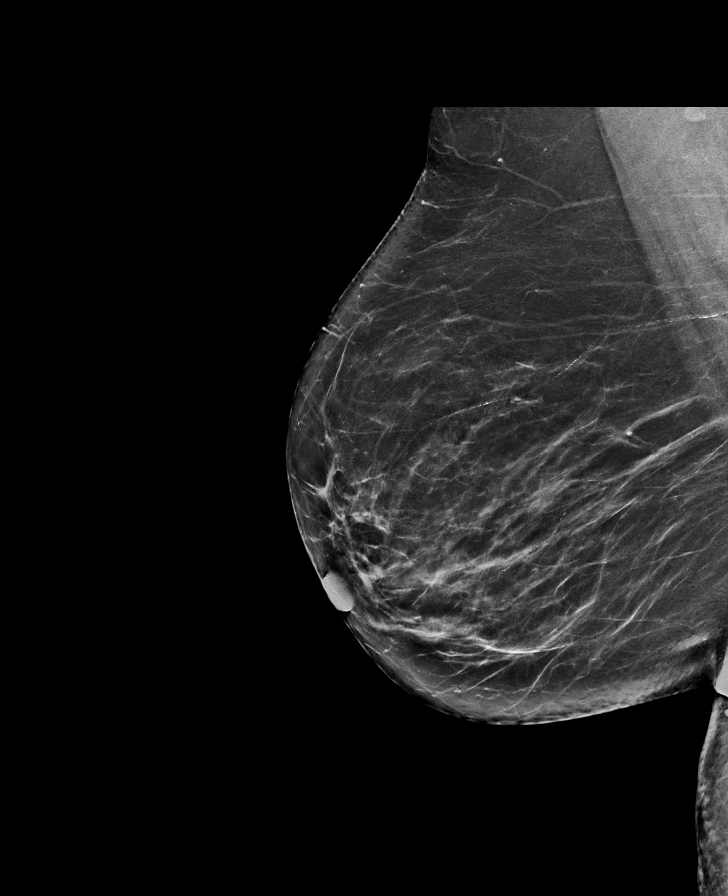

[R CC synth-2D]
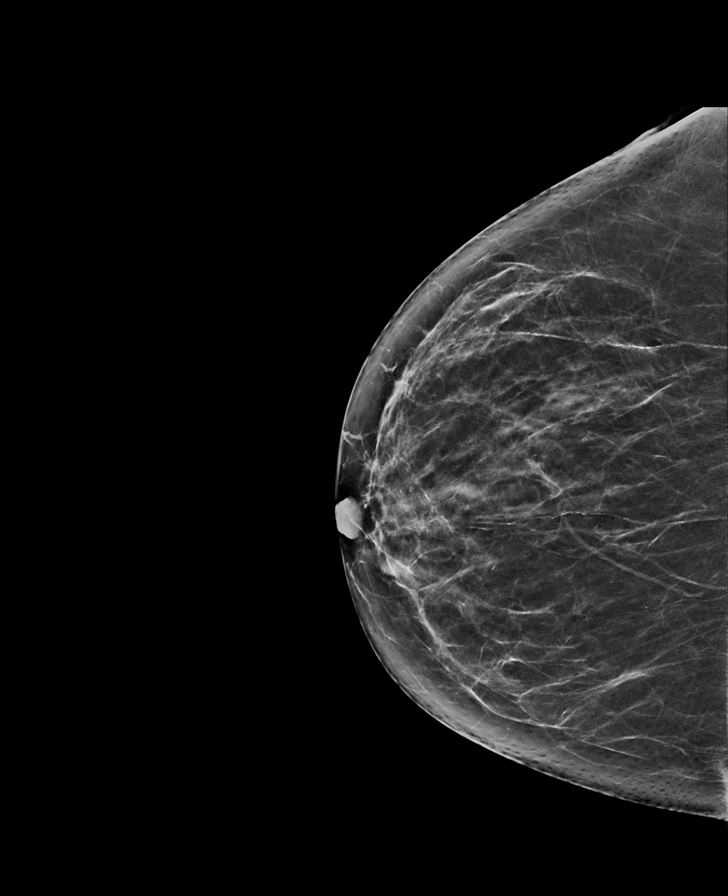

[L CC synth-2D]
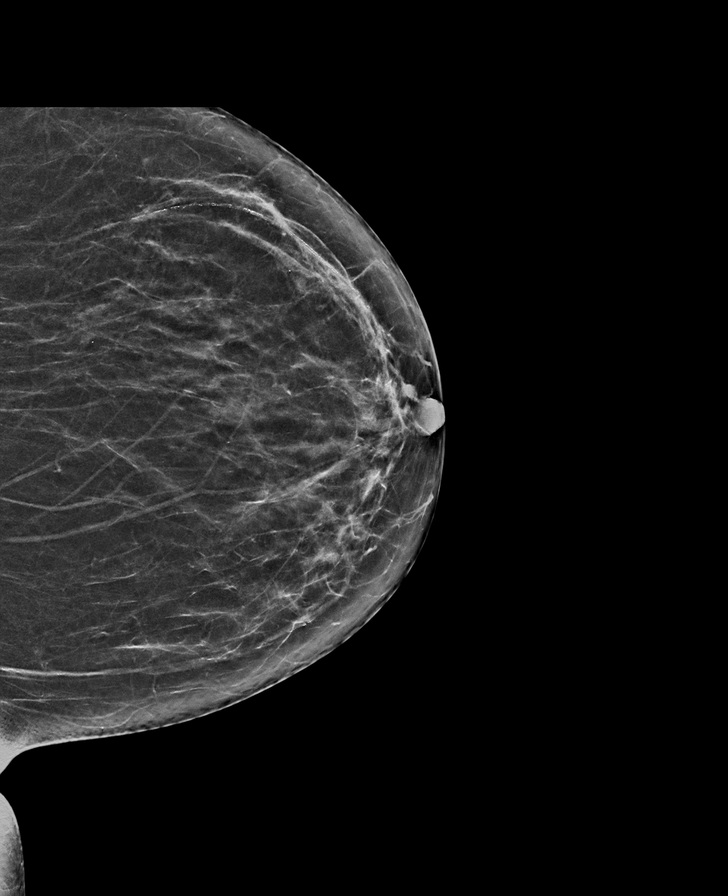

[L MLO synth-2D]
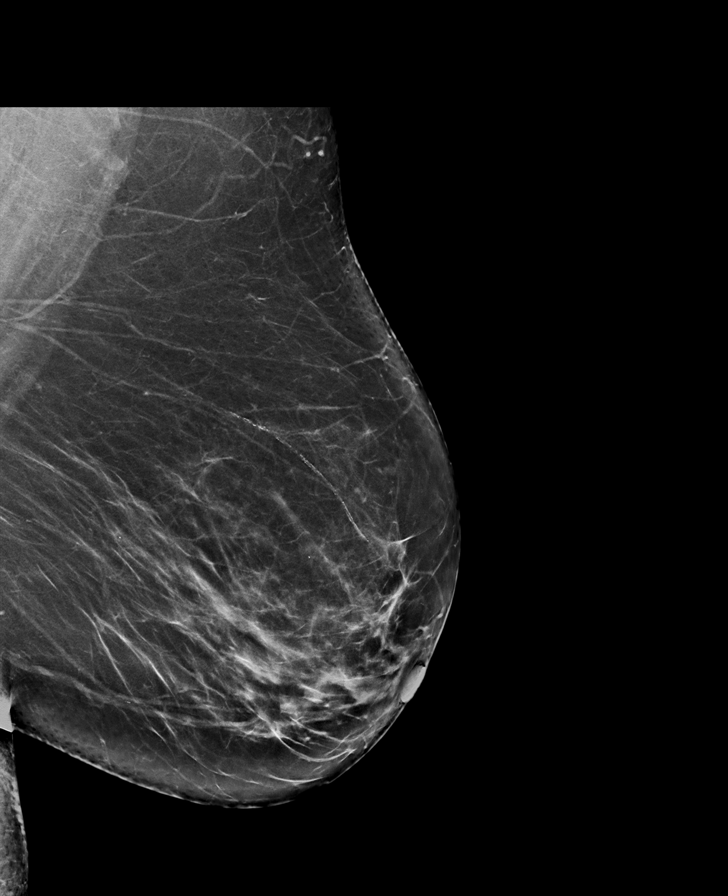

[R CC tomo · tomo slice 35/69.0]
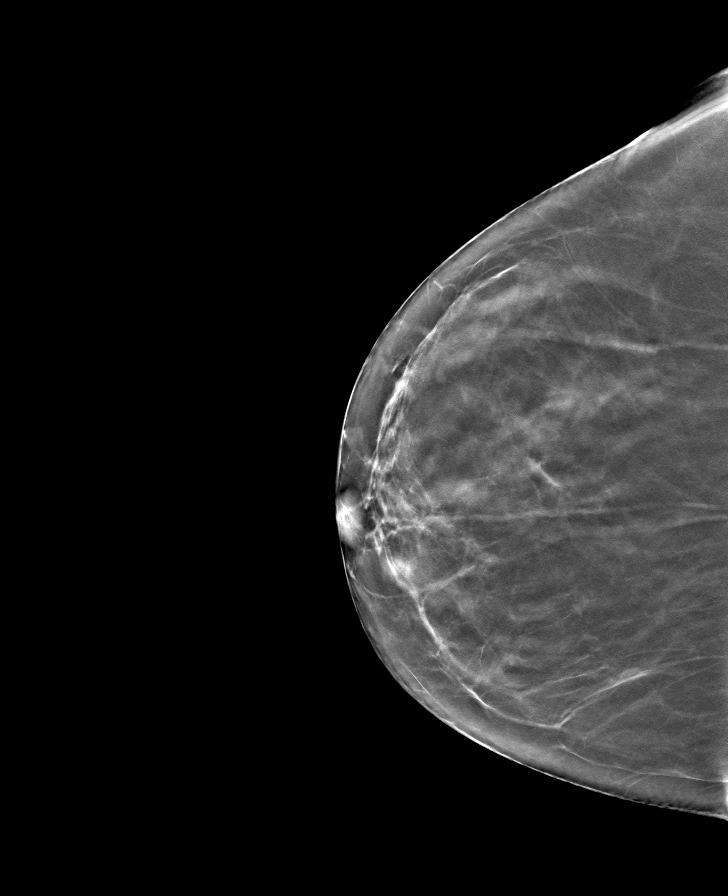

[L CC tomo · tomo slice 33/66.0]
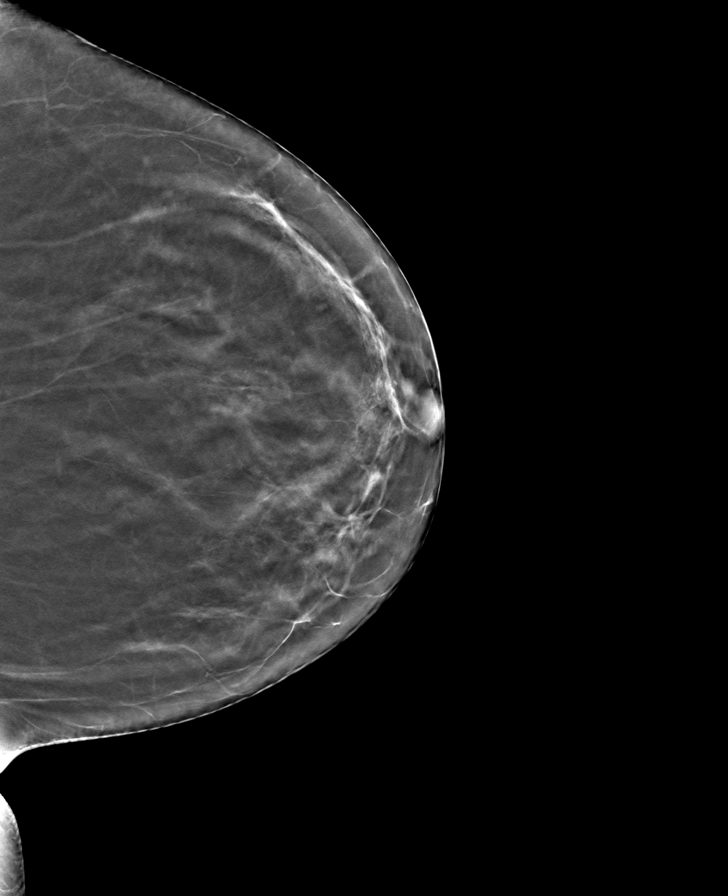

[L MLO tomo · tomo slice 41/82.0]
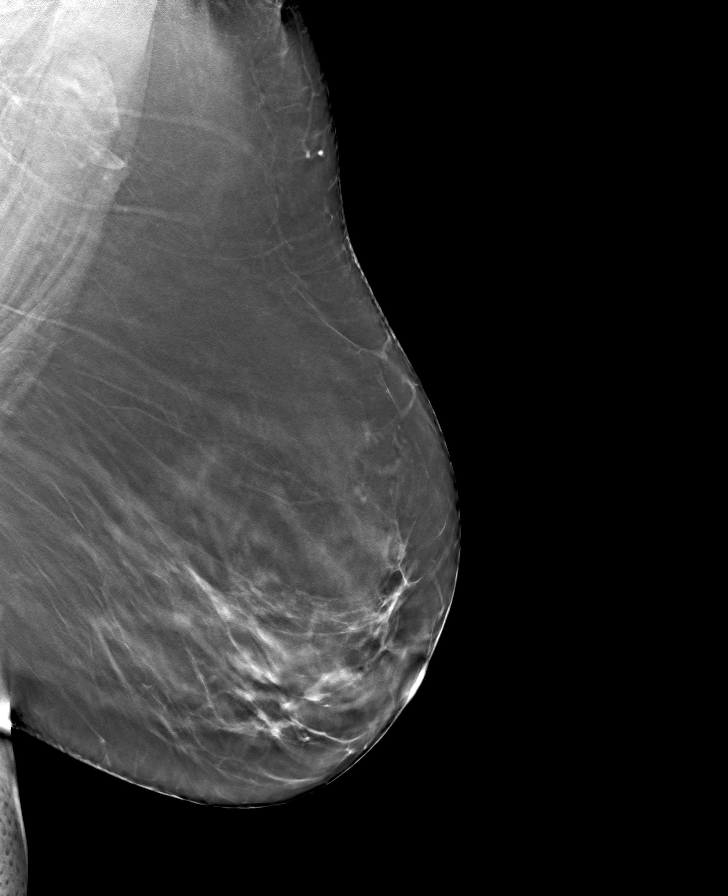

[R MLO tomo · tomo slice 39/77.0]
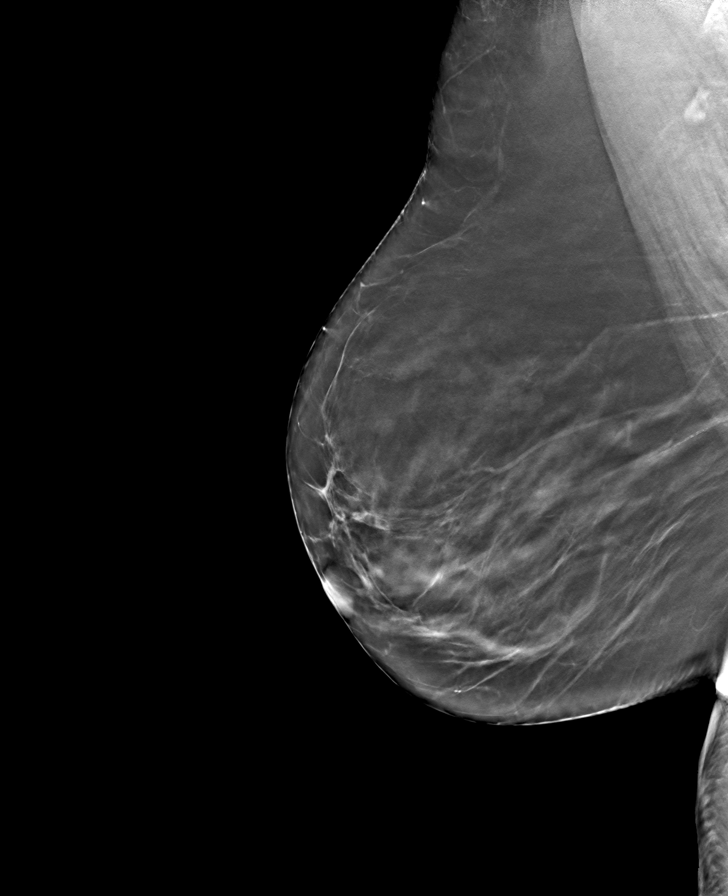

[8 of 24 positions shown; findings below may reference images not displayed]

ACR Breast Density Category b: There are scattered areas of
fibroglandular density.
FINDINGS: There are no findings suspicious for malignancy. Images were
processed with CAD.
IMPRESSION: No mammographic evidence of malignancy. A result letter of this
screening mammogram will be mailed directly to the patient.

RECOMMENDATION:
Screening mammogram in one year. (Code:[id number])

BI-RADS CATEGORY  1: Negative.

## 2022-08-14 ENCOUNTER — Ambulatory Visit: Payer: Medicare HMO | Admitting: Neurology

## 2022-08-16 ENCOUNTER — Telehealth: Payer: Self-pay | Admitting: *Deleted

## 2022-08-16 NOTE — Progress Notes (Signed)
  Care Coordination Note  08/16/2022 Name: LAKESA COSTE MRN: [id number] DOB: [birth year]  RYLIEE FIGGE is a 82 y.o. year old female who is a primary care patient of [facility], [facility] and is actively engaged with the care management team. I reached out to [name] by phone today to assist with re-scheduling a follow up visit with the RN Case Manager  Follow up plan: Unsuccessful telephone outreach attempt made. A HIPAA compliant phone message was left for the patient providing contact information and requesting a return call.   [facility]  Care Coordination Care Guide  Direct Dial: [phone]

## 2022-08-20 NOTE — Progress Notes (Signed)
    Care Coordination Note  08/20/2022 Name: [name] MRN: [id number] DOB: May 06, 1940  Janice Curtis is a 82 y.o. year old female who is a primary care patient of [facility] and is actively engaged with the care management team. I reached out to [name] by phone today to assist with re-scheduling a follow up visit with the RN Case Manager  Follow up plan: Unsuccessful telephone outreach attempt made. A HIPAA compliant phone message was left for the patient providing contact information and requesting a return call.   [facility]  Care Coordination Care Guide  Direct Dial: [phone]

## 2022-08-28 NOTE — Progress Notes (Signed)
  Care Coordination Note  08/28/2022 Name: Janice Curtis MRN: [id number] DOB: 1940/10/10  Janice Curtis is a 82 y.o. year old female who is a primary care patient of [facility] [employer] and is actively engaged with the care management team. I reached out to [name] by phone today to assist with re-scheduling a follow up visit with the RN Case Manager  Follow up plan: Unsuccessful telephone outreach attempt made. A HIPAA compliant phone message was left for the patient providing contact information and requesting a return call. Unable to make contact on outreach attempts x 2. RNCC notified via routed documentation in medical record.  We have been unable to make contact with the patient for follow up. The care management team is available to follow up with the patient after provider conversation with the patient regarding recommendation for care management engagement and subsequent re-referral to the care management team.   [facility] Coordination Care Guide  Direct Dial: [phone]

## 2022-08-29 ENCOUNTER — Telehealth: Payer: Self-pay | Admitting: *Deleted

## 2022-08-29 NOTE — Patient Outreach (Signed)
  Care Coordination   08/29/2022 Name: Janice Curtis MRN: [id number] DOB: 1940-06-20   Care Coordination Outreach Attempts:  3 unsuccessful outreach attempts by care guide to reschedule with [facility].  Follow Up Plan:  No further outreach attempts will be made at this time. We have been unable to contact the patient to offer or enroll patient in care coordination services  Care Coordination Interventions:  No, not indicated    Closed goals due to be unable to maintain contact with patient. RNCC removed from care team.  Demetrios Loll, BSN, RN-BC RN Care Coordinator [facility]  [facility] Direct Dial: [phone] Main #: [phone]

## 2022-09-05 DIAGNOSIS — I10 Essential (primary) hypertension: Secondary | ICD-10-CM | POA: Diagnosis not present

## 2022-09-05 DIAGNOSIS — R634 Abnormal weight loss: Secondary | ICD-10-CM | POA: Diagnosis not present

## 2022-09-05 DIAGNOSIS — R6881 Early satiety: Secondary | ICD-10-CM | POA: Diagnosis not present

## 2022-09-05 DIAGNOSIS — R5383 Other fatigue: Secondary | ICD-10-CM | POA: Diagnosis not present

## 2022-09-05 DIAGNOSIS — I69351 Hemiplegia and hemiparesis following cerebral infarction affecting right dominant side: Secondary | ICD-10-CM | POA: Diagnosis not present

## 2022-09-05 DIAGNOSIS — Z6824 Body mass index (BMI) 24.0-24.9, adult: Secondary | ICD-10-CM | POA: Diagnosis not present

## 2022-09-05 DIAGNOSIS — E538 Deficiency of other specified B group vitamins: Secondary | ICD-10-CM | POA: Diagnosis not present

## 2022-09-05 DIAGNOSIS — E663 Overweight: Secondary | ICD-10-CM | POA: Diagnosis not present

## 2022-09-10 ENCOUNTER — Other Ambulatory Visit (HOSPITAL_COMMUNITY): Payer: Self-pay | Admitting: Internal Medicine

## 2022-09-10 DIAGNOSIS — R634 Abnormal weight loss: Secondary | ICD-10-CM

## 2022-09-10 DIAGNOSIS — R6881 Early satiety: Secondary | ICD-10-CM

## 2022-09-11 ENCOUNTER — Other Ambulatory Visit: Payer: Self-pay | Admitting: Adult Health

## 2022-10-23 ENCOUNTER — Ambulatory Visit: Payer: Medicare HMO | Admitting: Neurology

## 2022-10-23 ENCOUNTER — Encounter: Payer: Self-pay | Admitting: Neurology

## 2022-10-23 VITALS — BP 156/82 | HR 64 | Ht 63.0 in | Wt 131.8 lb

## 2022-10-23 DIAGNOSIS — I69320 Aphasia following cerebral infarction: Secondary | ICD-10-CM

## 2022-10-23 DIAGNOSIS — I480 Paroxysmal atrial fibrillation: Secondary | ICD-10-CM

## 2022-10-23 DIAGNOSIS — Z8673 Personal history of transient ischemic attack (TIA), and cerebral infarction without residual deficits: Secondary | ICD-10-CM | POA: Diagnosis not present

## 2022-10-23 NOTE — Progress Notes (Signed)
[facility] [address] [place]. [place] [address] [phone]       OFFICE FOLLOW-UP NOTE  [name] Date of Birth:  1940-07-26 Medical Record Number:  [id number]   HPI:  Update 10/23/2022 ; she returns for follow-up after last visit 8 months ago.  She is accompanied by her son.  Patient continues to have mild speech and word finding difficulties.  At times she struggles to find words but most of the time can talk fluently.  She has no trouble with naming repetition or comprehension.  She continues to live at home with her grandson and daughter.  She is independent in all activities of daily living.  She does use a cane but occasionally can walk indoors without it.  She is slightly unsteady when she makes a sudden move but has only occasional falls but no injuries.  She is not driving but otherwise doing everything for herself.  She admits that the brain may not be as sharp as it used to be.  She has had no recurrent stroke or TIA symptoms.  She is tolerating Eliquis well with only minor bruising and no bleeding.  She is tolerating Crestor well without muscle aches and pains but she has not had any recent lipid profile checked.  She has no new complaints. Visit 02/13/2022 Janice Curtis is a 82 year old pleasant African-American lady seen today for initial office follow-up visit following hospital admission for stroke in October 2023.  She is accompanied by her son.  History is obtained from them and review of electronic medical records.  I personally reviewed pertinent available imaging films in PACS.  She has past medical history of colon cancer, hyperlipidemia, hypertension, obesity remote stroke who presented to [facility] on 12/09/2021 via EMS as a transfer from [facility] emergency for sudden onset of aphasia with right hemiparesis as noted by her grandson following a fall at home.  Grandson ordered out tongue on the floor and emergency room found  her to be nonverbal with right leg weakness.  EMS was called..  And she was brought in as a code stroke was called en route.  Patient was taken to [facility] and evaluated by teleneurology.  Administered IV TNK and emergent CT angiogram showed left MCA artery occlusion.  NIH Stroke scale was 33.  She was transferred emergently to [facility] and after discussion of risk benefits getting informed consent from the family she underwent successful mechanical thrombectomy with TICI 3 flow established in the left MCA.  She was admitted to the intensive care unit.  Blood pressure tightly controlled.  She was extubated.  She did well.  EKG showed atrial fibrillation which was new onset.  2D echo showed ejection fraction 60 to 65%.  LDL cholesterol 131 mg percent.  Hemoglobin A1c was 5.7.  Follow-up CT scan showed trace subarachnoid hemorrhage moderate left posterior insula and follow-up CT scan showed resolution.  Patient was started on Eliquis at discharge and went to rehabilitation in a skilled nursing facility which she has done well.  He has been discharged home now and is presently getting home physical and Occupational Therapy.  She is able to ambulate with a walker.  She has had no falls or injuries.  Her speech is improved significantly though she has occasional word finding difficulties and word hesitancy.  She also feels tired and has less energy.  She is living at home with her grandson  and daughter.  Patient is tolerating Eliquis well without bruising or bleeding but is complaining about high co-pay.  Her blood pressure is under good control and today it is 131/83.  She is tolerating Crestor well without muscle aches and pains.  ROS:   14 system review of systems is positive for speech difficulties, walking difficulty, imbalance, bruising and all other systems negative  PMH:  Past Medical History:  Diagnosis Date   Anxiety    Colon cancer (HCC)    Hypercholesterolemia    Hypertension     Obesity    Stroke (HCC) 12/05/2009    Social History:  Social History   Socioeconomic History   Marital status: Divorced    Spouse name: Not on file   Number of children: Not on file   Years of education: Not on file   Highest education level: Not on file  Occupational History   Not on file  Tobacco Use   Smoking status: Never   Smokeless tobacco: Never  Vaping Use   Vaping status: Never Used  Substance and Sexual Activity   Alcohol use: Not Currently   Drug use: No   Sexual activity: Yes  Other Topics Concern   Not on file  Social History Narrative   RAISES HER GRANDSON SINCE HE WAS 2 YO. HE IS 5 YO NOW.   Social Determinants of Health   Financial Resource Strain: Low Risk  (01/11/2022)   Overall Financial Resource Strain (CARDIA)    Difficulty of Paying Living Expenses: Not hard at all  Food Insecurity: No Food Insecurity (03/12/2022)   Hunger Vital Sign    Worried About Running Out of Food in the Last Year: Never true    Ran Out of Food in the Last Year: Never true  Transportation Needs: No Transportation Needs (03/12/2022)   PRAPARE - Administrator, Civil Service (Medical): No    Lack of Transportation (Non-Medical): No  Recent Concern: Transportation Needs - Unmet Transportation Needs (01/12/2022)   PRAPARE - Administrator, Civil Service (Medical): Yes    Lack of Transportation (Non-Medical): No  Physical Activity: Not on file  Stress: Not on file  Social Connections: Not on file  Intimate Partner Violence: Not At Risk (03/12/2022)   Humiliation, Afraid, Rape, and Kick questionnaire    Fear of Current or Ex-Partner: No    Emotionally Abused: No    Physically Abused: No    Sexually Abused: No    Medications:   Current Outpatient Medications on File Prior to Visit  Medication Sig Dispense Refill   apixaban (ELIQUIS) 5 MG TABS tablet Take 1 tablet (5 mg total) by mouth 2 (two) times daily. 60 tablet 0   clopidogrel (PLAVIX) 75 MG  tablet Take 75 mg by mouth daily.     metoprolol tartrate (LOPRESSOR) 25 MG tablet Take 25 mg by mouth 2 (two) times daily.     potassium chloride SA (KLOR-CON M) 20 MEQ tablet Take 2 tablets (40 mEq total) by mouth daily. (Patient taking differently: Take 40 mEq by mouth in the morning.) 60 tablet 0   rosuvastatin (CRESTOR) 40 MG tablet Place 1 tablet (40 mg total) into feeding tube daily. (Patient taking differently: Take 40 mg by mouth daily.) 30 tablet 0   verapamil (VERELAN) 180 MG 24 hr capsule Take 180 mg by mouth at bedtime.     diclofenac Sodium (VOLTAREN) 1 % GEL Apply 2 g topically in the morning and at bedtime. (Patient not  taking: Reported on 03/12/2022) 150 g 0   diltiazem (CARDIZEM LA) 240 MG 24 hr tablet Take 1 tablet (240 mg total) by mouth daily. 30 tablet 1   pantoprazole (PROTONIX) 40 MG tablet Take 1 tablet (40 mg total) by mouth at bedtime. (Patient not taking: Reported on 10/23/2022) 30 tablet 0   No current facility-administered medications on file prior to visit.    Allergies:  No Known Allergies  Physical Exam General: well developed, well nourished pleasant elderly African-American lady, seated, in no evident distress Head: head normocephalic and atraumatic.  Neck: supple with no carotid or supraclavicular bruits Cardiovascular: regular rate and rhythm, no murmurs Musculoskeletal: no deformity Skin:  no rash/petichiae Vascular:  Normal pulses all extremities Vitals:   10/23/22 1448  BP: (!) 156/82  Pulse: 64   Neurologic Exam Mental Status: Awake and fully alert. Oriented to place and time. Recent and remote memory intact. Attention span, concentration and fund of knowledge appropriate. Mood and affect appropriate.  Slightly hesitant speech with occasional word finding difficulties. Cranial Nerves: Fundoscopic exam not done pupils equal, briskly reactive to light. Extraocular movements full without nystagmus. Visual fields full to confrontation. Hearing intact.  Facial sensation intact. Face, tongue, palate moves normally and symmetrically.  Motor: Normal bulk and tone. Normal strength in all tested extremity muscles. Sensory.: intact to touch ,pinprick .position and vibratory sensation.  Coordination: Rapid alternating movements normal in all extremities. Finger-to-nose and heel-to-shin performed accurately bilaterally. Gait and Station: Arises from chair without difficulty. Stance is normal.  Uses a wheeled walker.  Slight dragging of the right leg.  Cautious gait.  Slightly unsteady when she makes a turn. Reflexes: 1+ and symmetric. Toes downgoing.   NIHSS  1 Modified Rankin  2   ASSESSMENT: 82 year old African-American lady with left middle cerebral artery infarct in October 2023 secondary to left M1 occlusion s/p mechanical thrombectomy with excellent revascularization from new onset atrial fibrillation     PLAN:I had a long d/w patient and her son about her recent embolic stroke, mechanical thrombectomy and new onset atrial fibrillation,risk for recurrent stroke/TIAs, personally independently reviewed imaging studies and stroke evaluation results and answered questions.Continue Eliquis (apixaban) daily  for secondary stroke prevention and maintain strict control of hypertension with blood pressure goal below 130/90, diabetes with hemoglobin A1c goal below 6.5% and lipids with LDL cholesterol goal below 70 mg/dL. I also advised the patient to eat a healthy diet with plenty of whole grains, cereals, fruits and vegetables, exercise regularly and maintain ideal body weight.  She was encouraged to use a walker at all times and was discussed fall safety precautions.  Check screening carotid ultrasound and transcranial Doppler study, lipid profile and hemoglobin A1c.  Patient may also consider possible participation in the [place] stroke prevention study ( Eliquis versu Milvexian- factor 11 inhibitor )if interested.  She was given written information to  take home and review and decide followup in the future with me in 1 year or call earlier if necessary. Greater than 50% of time during this 35 minute visit was spent on counseling,explanation of diagnosis, planning of further management, discussion with patient and family and coordination of care Delia Heady, MD Note: This document was prepared with digital dictation and possible smart phrase technology. Any transcriptional errors that result from this process are unintentional

## 2022-10-23 NOTE — Patient Instructions (Signed)
I had a long d/w patient and her son about her recent embolic stroke, mechanical thrombectomy and new onset atrial fibrillation,risk for recurrent stroke/TIAs, personally independently reviewed imaging studies and stroke evaluation results and answered questions.Continue Eliquis (apixaban) daily  for secondary stroke prevention and maintain strict control of hypertension with blood pressure goal below 130/90, diabetes with hemoglobin A1c goal below 6.5% and lipids with LDL cholesterol goal below 70 mg/dL. I also advised the patient to eat a healthy diet with plenty of whole grains, cereals, fruits and vegetables, exercise regularly and maintain ideal body weight.  She was encouraged to use a walker at all times and was discussed fall safety precautions.  Check screening carotid ultrasound and transcranial Doppler study, lipid profile and hemoglobin A1c.  Patient may also consider possible participation in the [place] AF stroke prevention study ( Eliquis versu Milvexian- factor 11 inhibitor )if interested.  She was given written information to take home and review and decide followup in the future with me in 1 year or call earlier if necessary.

## 2022-10-24 LAB — HEMOGLOBIN A1C
Est. average glucose Bld gHb Est-mCnc: 111 mg/dL
Hgb A1c MFr Bld: 5.5 % (ref 4.8–5.6)

## 2022-10-24 LAB — LIPID PANEL
Chol/HDL Ratio: 2.7 ratio (ref 0.0–4.4)
Cholesterol, Total: 154 mg/dL (ref 100–199)
HDL: 58 mg/dL (ref >39)
LDL Chol Calc (NIH): 77 mg/dL (ref 0–99)
Triglycerides: 108 mg/dL (ref 0–149)
VLDL Cholesterol Cal: 19 mg/dL (ref 5–40)

## 2022-10-31 ENCOUNTER — Encounter (HOSPITAL_COMMUNITY): Payer: Self-pay | Admitting: Radiology

## 2022-10-31 ENCOUNTER — Ambulatory Visit (HOSPITAL_COMMUNITY)
Admission: RE | Admit: 2022-10-31 | Discharge: 2022-10-31 | Disposition: A | Discharge status: Home / Self Care | Payer: Medicare HMO | Source: Ambulatory Visit | Attending: Internal Medicine | Admitting: Internal Medicine

## 2022-10-31 DIAGNOSIS — R6881 Early satiety: Secondary | ICD-10-CM | POA: Insufficient documentation

## 2022-10-31 DIAGNOSIS — R634 Abnormal weight loss: Secondary | ICD-10-CM | POA: Diagnosis not present

## 2022-10-31 DIAGNOSIS — K7689 Other specified diseases of liver: Secondary | ICD-10-CM | POA: Diagnosis not present

## 2022-10-31 DIAGNOSIS — K5900 Constipation, unspecified: Secondary | ICD-10-CM | POA: Diagnosis not present

## 2022-10-31 MED ORDER — IOHEXOL 300 MG/ML  SOLN
75.0000 mL | Freq: Once | INTRAMUSCULAR | Status: AC | PRN
  Administered 2022-10-31: 75 mL via INTRAVENOUS

## 2022-11-05 ENCOUNTER — Other Ambulatory Visit: Payer: Self-pay | Admitting: Neurology

## 2022-11-05 MED ORDER — EZETIMIBE 10 MG PO TABS: 10.0000 mg | ORAL_TABLET | Freq: Every day | ORAL | 3 refills | Status: DC

## 2022-11-05 NOTE — Progress Notes (Signed)
Kindly inform the patient her screening test for diabetes is quite satisfactory. Cholesterol profile is mostly okay except bad cholesterol which is borderline.  Recommend adding Zetia 10 mg daily to her Crestor 40 mg daily which she is already taking

## 2022-11-06 ENCOUNTER — Telehealth: Payer: Self-pay

## 2022-11-06 ENCOUNTER — Other Ambulatory Visit: Payer: Self-pay | Admitting: Adult Health

## 2022-11-06 NOTE — Telephone Encounter (Signed)
-----   Message from Delia Heady sent at 11/05/2022  8:36 PM EDT ----- Joneen Roach inform the patient her screening test for diabetes is quite satisfactory. Cholesterol profile is mostly okay except bad cholesterol which is borderline.  Recommend adding Zetia 10 mg daily to her Crestor 40 mg daily which she is already taking

## 2022-11-06 NOTE — Telephone Encounter (Signed)
Called [facility] and informed her per Dr Pearlean Brownie "Kindly inform the patient her screening test for diabetes is quite satisfactory.  Cholesterol profile is mostly okay except bad cholesterol which is borderline.  Recommend adding Zetia 10 mg daily to her Crestor 40 mg daily which she is already taking." Pt verbalized understanding. Pt had no questions at this time but was encouraged to call back if questions arise.

## 2022-11-07 ENCOUNTER — Other Ambulatory Visit: Payer: Self-pay | Admitting: Neurology

## 2023-01-07 ENCOUNTER — Ambulatory Visit (HOSPITAL_COMMUNITY)
Admission: RE | Admit: 2023-01-07 | Discharge: 2023-01-07 | Disposition: A | Discharge status: Home / Self Care | Payer: Medicare HMO | Source: Ambulatory Visit | Attending: Neurology | Admitting: Neurology

## 2023-01-07 ENCOUNTER — Ambulatory Visit (HOSPITAL_BASED_OUTPATIENT_CLINIC_OR_DEPARTMENT_OTHER)
Admission: RE | Admit: 2023-01-07 | Discharge: 2023-01-07 | Disposition: A | Discharge status: Home / Self Care | Payer: Medicare HMO | Source: Ambulatory Visit | Attending: Neurology | Admitting: Neurology

## 2023-01-07 DIAGNOSIS — I6523 Occlusion and stenosis of bilateral carotid arteries: Secondary | ICD-10-CM | POA: Diagnosis not present

## 2023-01-07 DIAGNOSIS — I10 Essential (primary) hypertension: Secondary | ICD-10-CM | POA: Insufficient documentation

## 2023-01-07 DIAGNOSIS — R4789 Other speech disturbances: Secondary | ICD-10-CM | POA: Diagnosis not present

## 2023-01-07 DIAGNOSIS — E785 Hyperlipidemia, unspecified: Secondary | ICD-10-CM | POA: Insufficient documentation

## 2023-01-07 DIAGNOSIS — I6602 Occlusion and stenosis of left middle cerebral artery: Secondary | ICD-10-CM

## 2023-01-07 DIAGNOSIS — Z8673 Personal history of transient ischemic attack (TIA), and cerebral infarction without residual deficits: Secondary | ICD-10-CM | POA: Insufficient documentation

## 2023-01-07 NOTE — Progress Notes (Signed)
Carotid artery duplex and transcranial dopplers have been completed. Preliminary results can be found in CV Proc through chart review.   01/07/23 1:58 PM Olen Cordial RVT

## 2023-01-14 ENCOUNTER — Other Ambulatory Visit: Payer: Self-pay | Admitting: Neurology

## 2023-01-14 ENCOUNTER — Telehealth: Payer: Self-pay

## 2023-01-14 DIAGNOSIS — I63412 Cerebral infarction due to embolism of left middle cerebral artery: Secondary | ICD-10-CM

## 2023-01-14 DIAGNOSIS — Z8673 Personal history of transient ischemic attack (TIA), and cerebral infarction without residual deficits: Secondary | ICD-10-CM

## 2023-01-14 DIAGNOSIS — I480 Paroxysmal atrial fibrillation: Secondary | ICD-10-CM

## 2023-01-14 NOTE — Telephone Encounter (Signed)
-----   Message from Delia Heady sent at 01/14/2023  9:36 AM EST ----- Kindly inform the patient that carotid ultrasound study shows moderate narrowing of the carotid artery in the neck on the right and mild on the left.  Continue medical management for now.  We will follow this with 6 monthly ultrasound or if she has recurrent stroke symptoms then we may need to look at this again sooner

## 2023-01-14 NOTE — Progress Notes (Signed)
Kindly inform the patient that transcranial Doppler study was not optimal due to thick bony windows not all blood vessels could be study.  There appears to be mild narrowing of the middle cerebral artery on the left side.

## 2023-01-14 NOTE — Telephone Encounter (Signed)
Called patient and informed her of her ultrasound results and needing to schedule an six month ultrasound check. Pt verbalized understanding. Pt had no questions at this time but was encouraged to call back if questions arise.

## 2023-01-14 NOTE — Progress Notes (Signed)
Kindly inform the patient that carotid ultrasound study shows moderate narrowing of the carotid artery in the neck on the right and mild on the left.  Continue medical management for now.  We will follow this with 6 monthly ultrasound or if she has recurrent stroke symptoms then we may need to look at this again sooner

## 2023-01-18 ENCOUNTER — Encounter (HOSPITAL_COMMUNITY): Payer: Self-pay

## 2023-01-18 ENCOUNTER — Emergency Department (HOSPITAL_COMMUNITY): Payer: Medicare HMO

## 2023-01-18 ENCOUNTER — Inpatient Hospital Stay (HOSPITAL_COMMUNITY)
Admission: EM | Admit: 2023-01-18 | Discharge: 2023-01-21 | DRG: 439 | Disposition: A | Discharge status: Home / Self Care | Payer: Medicare HMO | Attending: Internal Medicine | Admitting: Internal Medicine

## 2023-01-18 ENCOUNTER — Other Ambulatory Visit: Payer: Self-pay

## 2023-01-18 DIAGNOSIS — Z8249 Family history of ischemic heart disease and other diseases of the circulatory system: Secondary | ICD-10-CM

## 2023-01-18 DIAGNOSIS — E7800 Pure hypercholesterolemia, unspecified: Secondary | ICD-10-CM | POA: Diagnosis present

## 2023-01-18 DIAGNOSIS — Z7902 Long term (current) use of antithrombotics/antiplatelets: Secondary | ICD-10-CM | POA: Diagnosis not present

## 2023-01-18 DIAGNOSIS — E860 Dehydration: Secondary | ICD-10-CM | POA: Diagnosis not present

## 2023-01-18 DIAGNOSIS — E871 Hypo-osmolality and hyponatremia: Secondary | ICD-10-CM | POA: Diagnosis present

## 2023-01-18 DIAGNOSIS — K858 Other acute pancreatitis without necrosis or infection: Secondary | ICD-10-CM

## 2023-01-18 DIAGNOSIS — C189 Malignant neoplasm of colon, unspecified: Secondary | ICD-10-CM | POA: Diagnosis not present

## 2023-01-18 DIAGNOSIS — E876 Hypokalemia: Secondary | ICD-10-CM | POA: Diagnosis present

## 2023-01-18 DIAGNOSIS — Z79899 Other long term (current) drug therapy: Secondary | ICD-10-CM | POA: Diagnosis not present

## 2023-01-18 DIAGNOSIS — D649 Anemia, unspecified: Secondary | ICD-10-CM | POA: Diagnosis not present

## 2023-01-18 DIAGNOSIS — Z823 Family history of stroke: Secondary | ICD-10-CM

## 2023-01-18 DIAGNOSIS — Z8673 Personal history of transient ischemic attack (TIA), and cerebral infarction without residual deficits: Secondary | ICD-10-CM

## 2023-01-18 DIAGNOSIS — K859 Acute pancreatitis without necrosis or infection, unspecified: Principal | ICD-10-CM | POA: Diagnosis present

## 2023-01-18 DIAGNOSIS — I10 Essential (primary) hypertension: Secondary | ICD-10-CM | POA: Diagnosis present

## 2023-01-18 DIAGNOSIS — R1032 Left lower quadrant pain: Secondary | ICD-10-CM | POA: Diagnosis not present

## 2023-01-18 DIAGNOSIS — Z85038 Personal history of other malignant neoplasm of large intestine: Secondary | ICD-10-CM | POA: Diagnosis not present

## 2023-01-18 DIAGNOSIS — Z7901 Long term (current) use of anticoagulants: Secondary | ICD-10-CM | POA: Diagnosis not present

## 2023-01-18 DIAGNOSIS — R188 Other ascites: Secondary | ICD-10-CM | POA: Diagnosis not present

## 2023-01-18 DIAGNOSIS — I4819 Other persistent atrial fibrillation: Secondary | ICD-10-CM | POA: Diagnosis not present

## 2023-01-18 DIAGNOSIS — K5900 Constipation, unspecified: Secondary | ICD-10-CM | POA: Diagnosis not present

## 2023-01-18 DIAGNOSIS — N179 Acute kidney failure, unspecified: Secondary | ICD-10-CM | POA: Diagnosis not present

## 2023-01-18 DIAGNOSIS — K7689 Other specified diseases of liver: Secondary | ICD-10-CM | POA: Diagnosis not present

## 2023-01-18 LAB — URINALYSIS, ROUTINE W REFLEX MICROSCOPIC
Bilirubin Urine: NEGATIVE
Glucose, UA: NEGATIVE mg/dL
Ketones, ur: NEGATIVE mg/dL
Leukocytes,Ua: NEGATIVE
Nitrite: NEGATIVE
Protein, ur: 100 mg/dL — AB
Specific Gravity, Urine: 1.041 — ABNORMAL HIGH (ref 1.005–1.030)
WBC, UA: >50 WBC/hpf (ref 0–5)
pH: 6 (ref 5.0–8.0)

## 2023-01-18 LAB — COMPREHENSIVE METABOLIC PANEL
ALT: 22 U/L (ref 0–44)
AST: 25 U/L (ref 15–41)
Albumin: 3.5 g/dL (ref 3.5–5.0)
Alkaline Phosphatase: 52 U/L (ref 38–126)
Anion gap: 9 (ref 5–15)
BUN: 28 mg/dL — ABNORMAL HIGH (ref 8–23)
CO2: 20 mmol/L — ABNORMAL LOW (ref 22–32)
Calcium: 9.2 mg/dL (ref 8.9–10.3)
Chloride: 102 mmol/L (ref 98–111)
Creatinine, Ser: 1.55 mg/dL — ABNORMAL HIGH (ref 0.44–1.00)
GFR, Estimated: 33 mL/min — ABNORMAL LOW (ref >60)
Glucose, Bld: 121 mg/dL — ABNORMAL HIGH (ref 70–99)
Potassium: 4.9 mmol/L (ref 3.5–5.1)
Sodium: 131 mmol/L — ABNORMAL LOW (ref 135–145)
Total Bilirubin: 1.5 mg/dL — ABNORMAL HIGH (ref <1.2)
Total Protein: 7.1 g/dL (ref 6.5–8.1)

## 2023-01-18 LAB — LACTIC ACID, PLASMA
Lactic Acid, Venous: 1.8 mmol/L (ref 0.5–1.9)
Lactic Acid, Venous: 1.3 mmol/L (ref 0.5–1.9)

## 2023-01-18 LAB — CBC WITH DIFFERENTIAL/PLATELET
Abs Immature Granulocytes: 0.05 10*3/uL (ref 0.00–0.07)
Basophils Absolute: 0 10*3/uL (ref 0.0–0.1)
Basophils Relative: 0 %
Eosinophils Absolute: 0 10*3/uL (ref 0.0–0.5)
Eosinophils Relative: 0 %
HCT: 42.9 % (ref 36.0–46.0)
Hemoglobin: 14.1 g/dL (ref 12.0–15.0)
Immature Granulocytes: 0 %
Lymphocytes Relative: 7 %
Lymphs Abs: 1 10*3/uL (ref 0.7–4.0)
MCH: 25.9 pg — ABNORMAL LOW (ref 26.0–34.0)
MCHC: 32.9 g/dL (ref 30.0–36.0)
MCV: 78.7 fL — ABNORMAL LOW (ref 80.0–100.0)
Monocytes Absolute: 1 10*3/uL (ref 0.1–1.0)
Monocytes Relative: 7 %
Neutro Abs: 12.2 10*3/uL — ABNORMAL HIGH (ref 1.7–7.7)
Neutrophils Relative %: 86 %
Platelets: 143 10*3/uL — ABNORMAL LOW (ref 150–400)
RBC: 5.45 MIL/uL — ABNORMAL HIGH (ref 3.87–5.11)
RDW: 15.8 % — ABNORMAL HIGH (ref 11.5–15.5)
WBC: 14.4 10*3/uL — ABNORMAL HIGH (ref 4.0–10.5)
nRBC: 0 % (ref 0.0–0.2)

## 2023-01-18 LAB — LIPASE, BLOOD: Lipase: 211 U/L — ABNORMAL HIGH (ref 11–51)

## 2023-01-18 MED ORDER — CLOPIDOGREL BISULFATE 75 MG PO TABS
75.0000 mg | ORAL_TABLET | Freq: Every day | ORAL | Status: DC
  Administered 2023-01-19 – 2023-01-21 (×3): 75 mg via ORAL
  Filled 2023-01-18 (×3): qty 1

## 2023-01-18 MED ORDER — BISACODYL 5 MG PO TBEC: 5.0000 mg | DELAYED_RELEASE_TABLET | Freq: Every day | ORAL | Status: DC | PRN

## 2023-01-18 MED ORDER — FENTANYL CITRATE PF 50 MCG/ML IJ SOSY
50.0000 ug | PREFILLED_SYRINGE | Freq: Once | INTRAMUSCULAR | Status: AC
  Administered 2023-01-18: 50 ug via INTRAVENOUS
  Filled 2023-01-18: qty 1

## 2023-01-18 MED ORDER — ONDANSETRON HCL 4 MG/2ML IJ SOLN
4.0000 mg | Freq: Once | INTRAMUSCULAR | Status: AC
  Administered 2023-01-18: 4 mg via INTRAVENOUS
  Filled 2023-01-18: qty 2

## 2023-01-18 MED ORDER — DILTIAZEM HCL ER 240 MG PO CP24
240.0000 mg | ORAL_CAPSULE | Freq: Every day | ORAL | Status: DC
  Administered 2023-01-19 – 2023-01-21 (×3): 240 mg via ORAL
  Filled 2023-01-18 (×8): qty 1

## 2023-01-18 MED ORDER — HYDROMORPHONE HCL 1 MG/ML IJ SOLN
0.5000 mg | INTRAMUSCULAR | Status: DC | PRN
Start: 2023-01-18 — End: 2023-01-19
  Administered 2023-01-18: 0.5 mg via INTRAVENOUS
  Filled 2023-01-18: qty 0.5

## 2023-01-18 MED ORDER — ONDANSETRON HCL 4 MG PO TABS: 4.0000 mg | ORAL_TABLET | Freq: Four times a day (QID) | ORAL | Status: DC | PRN

## 2023-01-18 MED ORDER — METOPROLOL TARTRATE 25 MG PO TABS
25.0000 mg | ORAL_TABLET | Freq: Two times a day (BID) | ORAL | Status: DC
  Administered 2023-01-18 – 2023-01-21 (×6): 25 mg via ORAL
  Filled 2023-01-18 (×6): qty 1

## 2023-01-18 MED ORDER — IOHEXOL 300 MG/ML  SOLN
75.0000 mL | Freq: Once | INTRAMUSCULAR | Status: AC | PRN
  Administered 2023-01-18: 75 mL via INTRAVENOUS

## 2023-01-18 MED ORDER — SODIUM CHLORIDE 0.9 % IV BOLUS
1000.0000 mL | Freq: Once | INTRAVENOUS | Status: AC
  Administered 2023-01-18: 1000 mL via INTRAVENOUS

## 2023-01-18 MED ORDER — SODIUM CHLORIDE 0.9 % IV SOLN: INTRAVENOUS | Status: DC

## 2023-01-18 MED ORDER — ROSUVASTATIN CALCIUM 20 MG PO TABS
40.0000 mg | ORAL_TABLET | Freq: Every day | ORAL | Status: DC
  Administered 2023-01-19 – 2023-01-20 (×2): 40 mg via ORAL
  Filled 2023-01-18 (×2): qty 2

## 2023-01-18 MED ORDER — ONDANSETRON HCL 4 MG/2ML IJ SOLN: 4.0000 mg | Freq: Four times a day (QID) | INTRAMUSCULAR | Status: DC | PRN

## 2023-01-18 MED ORDER — APIXABAN 5 MG PO TABS
5.0000 mg | ORAL_TABLET | Freq: Two times a day (BID) | ORAL | Status: DC
  Administered 2023-01-18 – 2023-01-20 (×4): 5 mg via ORAL
  Filled 2023-01-18 (×4): qty 1

## 2023-01-18 NOTE — ED Triage Notes (Addendum)
Pt c/o weakness since Tuesday after she ate short sugars. Pt has thrown up multiple times and continues having nausea. Pt denies diarrhea. Pt claims LLQ pain.

## 2023-01-18 NOTE — ED Notes (Signed)
Pt states she is unable to provide urine sample at this time.

## 2023-01-18 NOTE — ED Notes (Signed)
Patient transported to CT 

## 2023-01-18 NOTE — H&P (Signed)
History and Physical    Patient: Janice Curtis VWU:[id number] DOB: Sep 05, 1940 DOA: 01/18/2023 DOS: the patient was seen and examined on 01/18/2023 PCP: [facility]  Patient coming from: Home  Chief Complaint:  Chief Complaint  Patient presents with   Abdominal Pain   HPI: Janice Curtis is a 82 y.o. female with medical history significant of atrial fibrillation, history of colon cancer, hypertension, obesity, history of stroke.  Patient seen for abdominal pain and nausea and vomiting since Wednesday after eating out at a hamburger place.  She has been intolerant of oral food and liquids and anything she takes in immediately is vomited.  She does have left upper quadrant abdominal pain that is radiating into her back.  No fevers, chills, chest pain, shortness of breath.  Review of Systems: As mentioned in the history of present illness. All other systems reviewed and are negative. Past Medical History:  Diagnosis Date   Anxiety    Colon cancer (HCC)    Hypercholesterolemia    Hypertension    Obesity    Stroke (HCC) 12/05/2009   Past Surgical History:  Procedure Laterality Date   COLONOSCOPY  12/27/08   simple adenoma/inflammatory polyp at anastomosis   COLONOSCOPY  04/23/2011   Procedure: COLONOSCOPY;  Surgeon: Arlyce Harman, MD;  Location: [facility];  Service: Endoscopy;  Laterality: N/A;  10:00   IR CT HEAD LTD  12/09/2021   IR PERCUTANEOUS ART THROMBECTOMY/INFUSION INTRACRANIAL INC DIAG ANGIO  12/09/2021   RADIOLOGY WITH ANESTHESIA N/A 12/09/2021   Procedure: RADIOLOGY WITH ANESTHESIA;  Surgeon: Radiologist, Medication, MD;  Location: [facility] OR;  Service: Radiology;  Laterality: N/A;   RIGHT COLECTOMY  11/14/2007   Social History:  reports that she has never smoked. She has never used smokeless tobacco. She reports that she does not currently use alcohol. She reports that she does not use drugs.  No Known Allergies  Family History  Problem Relation Age  of Onset   Heart attack Mother    Stroke Father     Prior to Admission medications   Medication Sig Start Date End Date Taking? Authorizing Provider  apixaban (ELIQUIS) 5 MG TABS tablet Take 1 tablet (5 mg total) by mouth 2 (two) times daily. 01/03/22   Sharee Holster, NP  clopidogrel (PLAVIX) 75 MG tablet Take 75 mg by mouth daily. 03/06/22   [provider]  diclofenac Sodium (VOLTAREN) 1 % GEL Apply 2 g topically in the morning and at bedtime. Patient not taking: Reported on 03/12/2022 01/03/22   Sharee Holster, NP  diltiazem (CARDIZEM LA) 240 MG 24 hr tablet Take 1 tablet (240 mg total) by mouth daily. 03/14/22 05/13/22  Sherryll Burger, Pratik D, DO  ezetimibe (ZETIA) 10 MG tablet Take 1 tablet (10 mg total) by mouth daily. 11/05/22   Micki Riley, MD  metoprolol tartrate (LOPRESSOR) 25 MG tablet Take 25 mg by mouth 2 (two) times daily. 05/16/22   [provider]  pantoprazole (PROTONIX) 40 MG tablet Take 1 tablet (40 mg total) by mouth at bedtime. Patient not taking: Reported on 10/23/2022 01/03/22   Sharee Holster, NP  potassium chloride SA (KLOR-CON M) 20 MEQ tablet Take 2 tablets (40 mEq total) by mouth daily. Patient taking differently: Take 40 mEq by mouth in the morning. 01/03/22   Sharee Holster, NP  rosuvastatin (CRESTOR) 40 MG tablet Place 1 tablet (40 mg total) into feeding tube daily. Patient taking differently: Take 40 mg by mouth  daily. 01/03/22   Sharee Holster, NP  verapamil (VERELAN) 180 MG 24 hr capsule Take 180 mg by mouth at bedtime.    [provider]    Physical Exam: Vitals:   01/18/23 1712 01/18/23 1715 01/18/23 1744 01/18/23 1930  BP:  138/74 125/80 (!) 151/78  Pulse:  72 68 69  Resp:  (!) 24 17 19   Temp:   98.9 F (37.2 C)   TempSrc:   Oral   SpO2:  93% 99% 98%  Weight: 58.5 kg     Height: 5\' 3"  (1.6 m)      General: Elderly female. Awake and alert and oriented x3. No acute cardiopulmonary distress.  HEENT: Normocephalic atraumatic.   Right and left ears normal in appearance.  Pupils equal, round, reactive to light. Extraocular muscles are intact. Sclerae anicteric and noninjected.  Moist mucosal membranes. No mucosal lesions.  Neck: Neck supple without lymphadenopathy. No carotid bruits. No masses palpated.  Cardiovascular: Irregular rate with normal S1-S2 sounds. No murmurs, rubs, gallops auscultated. No JVD.  Respiratory: Good respiratory effort with no wheezes, rales, rhonchi. Lungs clear to auscultation bilaterally.  No accessory muscle use. Abdomen: Soft, tender in the left upper quadrant greater than the right upper quadrant.  No masses or hepatosplenomegaly  Skin: No rashes, lesions, or ulcerations.  Dry, warm to touch. 2+ dorsalis pedis and radial pulses. Musculoskeletal: No calf or leg pain. All major joints not erythematous nontender.  No upper or lower joint deformation.  Good ROM.  No contractures  Psychiatric: Intact judgment and insight. Pleasant and cooperative. Neurologic: No focal neurological deficits. Strength is 5/5 and symmetric in upper and lower extremities.  Cranial nerves II through XII are grossly intact.  Data Reviewed: Results for orders placed or performed during the hospital encounter of 01/18/23 (from the past 24 hour(s))  CBC with Differential     Status: Abnormal   Collection Time: 01/18/23  5:43 PM  Result Value Ref Range   WBC 14.4 (H) 4.0 - 10.5 K/uL   RBC 5.45 (H) 3.87 - 5.11 MIL/uL   Hemoglobin 14.1 12.0 - 15.0 g/dL   HCT 82.9 56.2 - 13.0 %   MCV 78.7 (L) 80.0 - 100.0 fL   MCH 25.9 (L) 26.0 - 34.0 pg   MCHC 32.9 30.0 - 36.0 g/dL   RDW 86.5 (H) 78.4 - 69.6 %   Platelets 143 (L) 150 - 400 K/uL   nRBC 0.0 0.0 - 0.2 %   Neutrophils Relative % 86 %   Neutro Abs 12.2 (H) 1.7 - 7.7 K/uL   Lymphocytes Relative 7 %   Lymphs Abs 1.0 0.7 - 4.0 K/uL   Monocytes Relative 7 %   Monocytes Absolute 1.0 0.1 - 1.0 K/uL   Eosinophils Relative 0 %   Eosinophils Absolute 0.0 0.0 - 0.5 K/uL    Basophils Relative 0 %   Basophils Absolute 0.0 0.0 - 0.1 K/uL   Immature Granulocytes 0 %   Abs Immature Granulocytes 0.05 0.00 - 0.07 K/uL  Lipase, blood     Status: Abnormal   Collection Time: 01/18/23  5:43 PM  Result Value Ref Range   Lipase 211 (H) 11 - 51 U/L  Comprehensive metabolic panel     Status: Abnormal   Collection Time: 01/18/23  5:43 PM  Result Value Ref Range   Sodium 131 (L) 135 - 145 mmol/L   Potassium 4.9 3.5 - 5.1 mmol/L   Chloride 102 98 - 111 mmol/L   CO2 20 (  L) 22 - 32 mmol/L   Glucose, Bld 121 (H) 70 - 99 mg/dL   BUN 28 (H) 8 - 23 mg/dL   Creatinine, Ser 7.82 (H) 0.44 - 1.00 mg/dL   Calcium 9.2 8.9 - 95.6 mg/dL   Total Protein 7.1 6.5 - 8.1 g/dL   Albumin 3.5 3.5 - 5.0 g/dL   AST 25 15 - 41 U/L   ALT 22 0 - 44 U/L   Alkaline Phosphatase 52 38 - 126 U/L   Total Bilirubin 1.5 (H) <1.2 mg/dL   GFR, Estimated 33 (L) >60 mL/min   Anion gap 9 5 - 15  Lactic acid, plasma     Status: None   Collection Time: 01/18/23  6:14 PM  Result Value Ref Range   Lactic Acid, Venous 1.8 0.5 - 1.9 mmol/L  Lactic acid, plasma     Status: None   Collection Time: 01/18/23  8:48 PM  Result Value Ref Range   Lactic Acid, Venous 1.3 0.5 - 1.9 mmol/L  Urinalysis, Routine w reflex microscopic -Urine, Clean Catch     Status: Abnormal   Collection Time: 01/18/23  9:02 PM  Result Value Ref Range   Color, Urine YELLOW YELLOW   APPearance HAZY (A) CLEAR   Specific Gravity, Urine 1.041 (H) 1.005 - 1.030   pH 6.0 5.0 - 8.0   Glucose, UA NEGATIVE NEGATIVE mg/dL   Hgb urine dipstick SMALL (A) NEGATIVE   Bilirubin Urine NEGATIVE NEGATIVE   Ketones, ur NEGATIVE NEGATIVE mg/dL   Protein, ur 213 (A) NEGATIVE mg/dL   Nitrite NEGATIVE NEGATIVE   Leukocytes,Ua NEGATIVE NEGATIVE   RBC / HPF 0-5 0 - 5 RBC/hpf   WBC, UA >50 0 - 5 WBC/hpf   Bacteria, UA RARE (A) NONE SEEN   Squamous Epithelial / HPF 11-20 0 - 5 /HPF   Mucus PRESENT     CT ABDOMEN PELVIS W CONTRAST  Result Date:  01/18/2023 CLINICAL DATA:  Left lower quadrant abdominal pain and vomiting. History of colon cancer. EXAM: CT ABDOMEN AND PELVIS WITH CONTRAST TECHNIQUE: Multidetector CT imaging of the abdomen and pelvis was performed using the standard protocol following bolus administration of intravenous contrast. RADIATION DOSE REDUCTION: This exam was performed according to the departmental dose-optimization program which includes automated exposure control, adjustment of the mA and/or kV according to patient size and/or use of iterative reconstruction technique. CONTRAST:  75mL OMNIPAQUE IOHEXOL 300 MG/ML  SOLN COMPARISON:  CT abdomen pelvis dated 10/31/2022. FINDINGS: Lower chest: The visualized lung bases are clear. No intra-abdominal free air.  Small ascites. Hepatobiliary: Small cyst in the left lobe of the liver. No calcified gallstone. Pancreas: There is significant inflammatory changes of the upper abdominal fat plane surrounding the pancreas consistent with acute pancreatitis. Correlation with pancreatic enzymes recommended. No drainable fluid collection/abscess or pseudocyst. Spleen: Small stable cyst with peripheral calcification. The spleen is otherwise unremarkable. Adrenals/Urinary Tract: The adrenal glands are unremarkable. There is no hydronephrosis on either side. There is symmetric enhancement and excretion of contrast by both kidneys. The visualized ureters and urinary bladder appear unremarkable. Stomach/Bowel: Postsurgical changes of right hemicolectomy with ileotransverse anastomosis in the upper abdomen. There is no bowel obstruction. Appendectomy. Vascular/Lymphatic: Advanced aortoiliac atherosclerotic disease. There is atherosclerotic calcification of the origin of the SMA with diminished flow within the SMA. The SMA however appears patent. CT angiography may provide better evaluation if there is clinical concern for mesenteric ischemia. The IVC is unremarkable. No portal venous gas. There is no  adenopathy. Reproductive: The uterus is anteverted and grossly unremarkable. No adnexal masses. Other: None Musculoskeletal: Osteopenia with degenerative changes of the spine. No acute osseous pathology. IMPRESSION: 1. Acute pancreatitis. No drainable fluid collection/abscess or pseudocyst. 2. Advanced atherosclerotic calcification of the aorta with diminished flow in the SMA which appears patent. CT angiography may provide better evaluation if there is clinical concern for mesenteric ischemia. 3. Postsurgical changes of right hemicolectomy with ileotransverse anastomosis. No bowel obstruction. 4.  Aortic Atherosclerosis (ICD10-I70.0). Electronically Signed   By: Elgie Collard M.D.   On: 01/18/2023 20:24     Assessment and Plan: No notes have been filed under this hospital service. Service: Hospitalist  Active Problems:   Acute renal failure (HCC)   History of malignant neoplasm of large intestine   Persistent atrial fibrillation (HCC)   History of CVA (cerebrovascular accident)   Acute pancreatitis  Acute pancreatitis IVF Pain meds NPO Zofran for nausea Recheck lipase in AM AKI Fluids Recheck SCr in AM A fib Rate controlled Continue eliquis H/o CVA H/o colon cancer   Advance Care Planning:   Code Status: Full Code confirmed by patient  Consults: none  Family Communication: daughter present  Severity of Illness: The appropriate patient status for this patient is INPATIENT. Inpatient status is judged to be reasonable and necessary in order to provide the required intensity of service to ensure the patient's safety. The patient's presenting symptoms, physical exam findings, and initial radiographic and laboratory data in the context of their chronic comorbidities is felt to place them at high risk for further clinical deterioration. Furthermore, it is not anticipated that the patient will be medically stable for discharge from the hospital within 2 midnights of admission.   * I  certify that at the point of admission it is my clinical judgment that the patient will require inpatient hospital care spanning beyond 2 midnights from the point of admission due to high intensity of service, high risk for further deterioration and high frequency of surveillance required.*  Author: Levie Heritage, DO 01/18/2023 10:10 PM  For on call review [email or web address].

## 2023-01-18 NOTE — ED Provider Notes (Signed)
[place] EMERGENCY DEPARTMENT AT [facility] Provider Note   CSN: [id number] Arrival date & time: 01/18/23  1650     History  Chief Complaint  Patient presents with   Abdominal Pain    Janice Curtis is a 82 y.o. female.   Abdominal Pain Associated symptoms: constipation, nausea and vomiting   Associated symptoms: no chest pain, no chills, no dysuria, no fever and no shortness of breath        Janice Curtis is a 82 y.o. female with past history of prior strokes, hypertension, atrial fibrillation (anticoagulated on Eliquis) anemia, and history of colon cancer with previous right colectomy in 2009 who presents to the Emergency Department complaining of left lower quadrant pain, decreased appetite and vomiting.  Symptoms began after eating at [employer].  Began having pain later that evening and vomiting began the following day.  Also reports having some constipation. She states that multiple episodes of vomiting has also caused her to feel tired and weak all over.  She denies any unilateral weakness or facial weakness.  No changes in speech.    No known fever chest pain or shortness of breath.  No flank pain or dysuria.  Home Medications Prior to Admission medications   Medication Sig Start Date End Date Taking? Authorizing Provider  apixaban (ELIQUIS) 5 MG TABS tablet Take 1 tablet (5 mg total) by mouth 2 (two) times daily. 01/03/22   Sharee Holster, NP  clopidogrel (PLAVIX) 75 MG tablet Take 75 mg by mouth daily. 03/06/22   [provider]  diclofenac Sodium (VOLTAREN) 1 % GEL Apply 2 g topically in the morning and at bedtime. Patient not taking: Reported on 03/12/2022 01/03/22   Sharee Holster, NP  diltiazem (CARDIZEM LA) 240 MG 24 hr tablet Take 1 tablet (240 mg total) by mouth daily. 03/14/22 05/13/22  Sherryll Burger, Pratik D, DO  ezetimibe (ZETIA) 10 MG tablet Take 1 tablet (10 mg total) by mouth daily. 11/05/22   Micki Riley, MD  metoprolol tartrate  (LOPRESSOR) 25 MG tablet Take 25 mg by mouth 2 (two) times daily. 05/16/22   [provider]  pantoprazole (PROTONIX) 40 MG tablet Take 1 tablet (40 mg total) by mouth at bedtime. Patient not taking: Reported on 10/23/2022 01/03/22   Sharee Holster, NP  potassium chloride SA (KLOR-CON M) 20 MEQ tablet Take 2 tablets (40 mEq total) by mouth daily. Patient taking differently: Take 40 mEq by mouth in the morning. 01/03/22   Sharee Holster, NP  rosuvastatin (CRESTOR) 40 MG tablet Place 1 tablet (40 mg total) into feeding tube daily. Patient taking differently: Take 40 mg by mouth daily. 01/03/22   Sharee Holster, NP  verapamil (VERELAN) 180 MG 24 hr capsule Take 180 mg by mouth at bedtime.    [provider]      Allergies    Patient has no known allergies.    Review of Systems   Review of Systems  Constitutional:  Positive for appetite change. Negative for chills and fever.  Respiratory:  Negative for shortness of breath.   Cardiovascular:  Negative for chest pain.  Gastrointestinal:  Positive for abdominal pain, constipation, nausea and vomiting.  Genitourinary:  Negative for dysuria and flank pain.  Skin:  Negative for rash.  Neurological:  Positive for weakness.  Psychiatric/Behavioral:  Negative for confusion.     Physical Exam Updated Vital Signs BP 138/74   Pulse 72   Resp (!) 24  Ht 5\' 3"  (1.6 m)   Wt 58.5 kg   SpO2 93%   BMI 22.85 kg/m  Physical Exam Vitals and nursing note reviewed.  Constitutional:      General: She is not in acute distress.    Appearance: She is not toxic-appearing.  HENT:     Mouth/Throat:     Mouth: Mucous membranes are dry.  Eyes:     Extraocular Movements: Extraocular movements intact.     Conjunctiva/sclera: Conjunctivae normal.     Pupils: Pupils are equal, round, and reactive to light.  Cardiovascular:     Rate and Rhythm: Normal rate and regular rhythm.     Pulses: Normal pulses.  Pulmonary:     Effort: Pulmonary  effort is normal.  Abdominal:     General: Bowel sounds are normal.     Palpations: Abdomen is soft.     Tenderness: There is abdominal tenderness.     Comments: Some diffuse tenderness of the abdomen, but specifically tender to palpation left lower quadrant.  No guarding or rebound tenderness.  No CVA tenderness  Musculoskeletal:     Cervical back: Normal range of motion.     Right lower leg: No edema.     Left lower leg: No edema.  Skin:    Capillary Refill: Capillary refill takes less than 2 seconds.  Neurological:     General: No focal deficit present.     Mental Status: She is alert.     Sensory: Sensation is intact. No sensory deficit.     Motor: No weakness.     Comments: CN II through XII intact.  Speech clear.  No unilateral weakness, no facial droop, mentating well.     ED Results / Procedures / Treatments   Labs (all labs ordered are listed, but only abnormal results are displayed) Labs Reviewed  CBC WITH DIFFERENTIAL/PLATELET - Abnormal; Notable for the following components:      Result Value   WBC 14.4 (*)    RBC 5.45 (*)    MCV 78.7 (*)    MCH 25.9 (*)    RDW 15.8 (*)    Platelets 143 (*)    Neutro Abs 12.2 (*)    All other components within normal limits  LIPASE, BLOOD - Abnormal; Notable for the following components:   Lipase 211 (*)    All other components within normal limits  COMPREHENSIVE METABOLIC PANEL - Abnormal; Notable for the following components:   Sodium 131 (*)    CO2 20 (*)    Glucose, Bld 121 (*)    BUN 28 (*)    Creatinine, Ser 1.55 (*)    Total Bilirubin 1.5 (*)    GFR, Estimated 33 (*)    All other components within normal limits  URINALYSIS, ROUTINE W REFLEX MICROSCOPIC  LACTIC ACID, PLASMA  LACTIC ACID, PLASMA    EKG EKG Interpretation Date/Time:  Friday January 18 2023 17:48:21 EST Ventricular Rate:  56 PR Interval:    QRS Duration:  102 QT Interval:  389 QTC Calculation: 376 R Axis:   -69  Text Interpretation: Atrial  fibrillation Left anterior fascicular block Abnormal R-wave progression, late transition LVH with secondary repolarization abnormality since last tracing no significant change Confirmed by Eber Hong ([id number]) on 01/18/2023 5:50:51 PM  Radiology No results found.  Procedures Procedures    Medications Ordered in ED Medications  iohexol (OMNIPAQUE) 300 MG/ML solution 75 mL (has no administration in time range)  ondansetron (ZOFRAN) injection 4 mg (has no  administration in time range)  sodium chloride 0.9 % bolus 1,000 mL (1,000 mLs Intravenous New Bag/Given 01/18/23 1753)    ED Course/ Medical Decision Making/ A&P                                 Medical Decision Making Patient here with reported generalized weakness, left lower abdominal pain, constipation and nausea vomiting.  Symptoms began  after eating at [employer].  No reported fever.  Has decreased appetite as well.  She endorses multiple episodes of vomiting.  No hematemesis  Differential would include but not limited to acute diverticulitis, SBO, viral process, gastroenteritis, given history of prior colon cancer, recurrence also considered.  Clinically, patient appears dehydrated as she has very dry mucous membranes.  Sepsis also considered but felt less likely.  Amount and/or Complexity of Data Reviewed Labs: ordered.    Details: Labs interpreted by me presents with white count of 14,000, hemoglobin unremarkable.  Lipase elevated at 211.  Chemistries show mild hyponatremia BUN elevated at 28 serum creatinine 1.5, 1.19 ten months ago Radiology: ordered.    Details: CT abdomen pelvis pending Discussion of management or test interpretation with external provider(s): Pt with LLQ pain, nausea and vomiting.  Hx of colon CA with prior colectomy 2009.  Appeared dehydrated on arrival, has received IV fluids on recheck, appears to be feeling better.  Vital signs reassuring.  No longer tachypneic and she remains afebrile.  CT  abdomen and pelvis pending.  Discussed with Dr. Hyacinth Meeker.  Lactic acid also pending, if elevated possible this may be secondary to dehydration I would recommend second lactic acid as first was drawn prior to receiving IV fluids.  May require hospital admit pending completion of her workup.  Risk Prescription drug management.           Final Clinical Impression(s) / ED Diagnoses Final diagnoses:  Left lower quadrant abdominal pain    Rx / DC Orders ED Discharge Orders     None         Rosey Bath 01/18/23 1915    Eber Hong, MD 01/18/23 2153

## 2023-01-18 NOTE — Plan of Care (Signed)
  Problem: Clinical Measurements: Goal: Will remain free from infection Outcome: Not Progressing   Problem: Clinical Measurements: Goal: Diagnostic test results will improve Outcome: Not Progressing   Problem: Activity: Goal: Risk for activity intolerance will decrease Outcome: Not Progressing   Problem: Pain Management: Goal: General experience of comfort will improve Outcome: Not Progressing

## 2023-01-19 DIAGNOSIS — K858 Other acute pancreatitis without necrosis or infection: Secondary | ICD-10-CM | POA: Diagnosis not present

## 2023-01-19 LAB — LIPID PANEL
Cholesterol: 83 mg/dL (ref 0–200)
HDL: 37 mg/dL — ABNORMAL LOW (ref >40)
LDL Cholesterol: 37 mg/dL (ref 0–99)
Total CHOL/HDL Ratio: 2.2 {ratio}
Triglycerides: 46 mg/dL (ref <150)
VLDL: 9 mg/dL (ref 0–40)

## 2023-01-19 LAB — BASIC METABOLIC PANEL
Anion gap: 9 (ref 5–15)
BUN: 22 mg/dL (ref 8–23)
CO2: 20 mmol/L — ABNORMAL LOW (ref 22–32)
Calcium: 8.5 mg/dL — ABNORMAL LOW (ref 8.9–10.3)
Chloride: 104 mmol/L (ref 98–111)
Creatinine, Ser: 1.27 mg/dL — ABNORMAL HIGH (ref 0.44–1.00)
GFR, Estimated: 42 mL/min — ABNORMAL LOW (ref >60)
Glucose, Bld: 93 mg/dL (ref 70–99)
Potassium: 4.2 mmol/L (ref 3.5–5.1)
Sodium: 133 mmol/L — ABNORMAL LOW (ref 135–145)

## 2023-01-19 LAB — LIPASE, BLOOD: Lipase: 130 U/L — ABNORMAL HIGH (ref 11–51)

## 2023-01-19 MED ORDER — HYDROMORPHONE HCL 1 MG/ML IJ SOLN: 0.5000 mg | INTRAMUSCULAR | Status: DC | PRN

## 2023-01-19 MED ORDER — BOOST / RESOURCE BREEZE PO LIQD CUSTOM
1.0000 | Freq: Three times a day (TID) | ORAL | Status: DC
  Administered 2023-01-19 – 2023-01-20 (×4): 1 via ORAL

## 2023-01-19 MED ORDER — ACETAMINOPHEN 325 MG PO TABS
650.0000 mg | ORAL_TABLET | Freq: Four times a day (QID) | ORAL | Status: DC | PRN
  Administered 2023-01-19 – 2023-01-20 (×3): 650 mg via ORAL
  Filled 2023-01-19 (×3): qty 2

## 2023-01-19 NOTE — Plan of Care (Signed)
  Problem: Pain Management: Goal: General experience of comfort will improve Outcome: Progressing   Problem: Safety: Goal: Ability to remain free from injury will improve Outcome: Progressing   Problem: Skin Integrity: Goal: Risk for impaired skin integrity will decrease Outcome: Progressing

## 2023-01-19 NOTE — Progress Notes (Signed)
PROGRESS NOTE    Janice Curtis  UJW:[id number] DOB: [birth year] DOA: 01/18/2023 PCP: [facility]   Brief Narrative:    Janice Curtis is a 82 y.o. female with medical history significant of atrial fibrillation, history of colon cancer, hypertension, obesity, history of stroke.  Patient seen for abdominal pain and nausea and vomiting since Wednesday after eating out at a hamburger place.  She had been admitted for acute pancreatitis as well as associated AKI.  She is overall improving and asking for diet  Assessment & Plan:   Principal Problem:   Acute pancreatitis Active Problems:   Acute renal failure (HCC)   History of malignant neoplasm of large intestine   Persistent atrial fibrillation (HCC)   History of CVA (cerebrovascular accident)  Assessment and Plan:   Acute pancreatitis-improving IVF decreased to 150 cc/hour Pain meds Trial of clear liquid diet Zofran for nausea Lipase downward trending Check lipid panel CT with no sign of cyst or other complications Denies alcohol use AKI-improving Fluids Recheck SCr in AM A fib Rate controlled, continue diltiazem and metoprolol Continue eliquis H/o CVA H/o colon cancer      DVT prophylaxis:apixaban Code Status: Full Family Communication: None at bedside Disposition Plan:  Status is: Inpatient Remains inpatient appropriate because: Need for ongoing IV fluid  Consultants:  None  Procedures:  None  Antimicrobials:  None   Subjective: Patient seen and evaluated today with some improvement and abdominal pain and denies any nausea or vomiting.  She is asking for something to eat if possible.  Objective: Vitals:   01/18/23 2300 01/18/23 2308 01/19/23 0203 01/19/23 0601  BP:  (!) 151/70 120/62 134/76  Pulse:  68 70 81  Resp:  18 18 18   Temp:  98.3 F (36.8 C) 97.8 F (36.6 C) 97.7 F (36.5 C)  TempSrc:  Oral Oral Oral  SpO2:  100% 99% 99%  Weight: 58.3 kg     Height:         Intake/Output Summary (Last 24 hours) at 01/19/2023 0726 Last data filed at 01/18/2023 1840 Gross per 24 hour  Intake 999 ml  Output --  Net 999 ml   Filed Weights   01/18/23 1712 01/18/23 2300  Weight: 58.5 kg 58.3 kg    Examination:  General exam: Appears calm and comfortable  Respiratory system: Clear to auscultation. Respiratory effort normal. Cardiovascular system: S1 & S2 heard, RRR.  Gastrointestinal system: Abdomen is soft Central nervous system: Alert and awake Extremities: No edema Skin: No significant lesions noted Psychiatry: Flat affect.    Data Reviewed: I have personally reviewed following labs and imaging studies  CBC: Recent Labs  Lab 01/18/23 1743  WBC 14.4*  NEUTROABS 12.2*  HGB 14.1  HCT 42.9  MCV 78.7*  PLT 143*   Basic Metabolic Panel: Recent Labs  Lab 01/18/23 1743 01/19/23 0437  NA 131* 133*  K 4.9 4.2  CL 102 104  CO2 20* 20*  GLUCOSE 121* 93  BUN 28* 22  CREATININE 1.55* 1.27*  CALCIUM 9.2 8.5*   GFR: Estimated Creatinine Clearance: 28.7 mL/min (A) (by C-G formula based on SCr of 1.27 mg/dL (H)). Liver Function Tests: Recent Labs  Lab 01/18/23 1743  AST 25  ALT 22  ALKPHOS 52  BILITOT 1.5*  PROT 7.1  ALBUMIN 3.5   Recent Labs  Lab 01/18/23 1743 01/19/23 0437  LIPASE 211* 130*   No results for input(s): "AMMONIA" in the last 168 hours. Coagulation Profile: No results for  input(s): "INR", "PROTIME" in the last 168 hours. Cardiac Enzymes: No results for input(s): "CKTOTAL", "CKMB", "CKMBINDEX", "TROPONINI" in the last 168 hours. BNP (last 3 results) No results for input(s): "PROBNP" in the last 8760 hours. HbA1C: No results for input(s): "HGBA1C" in the last 72 hours. CBG: No results for input(s): "GLUCAP" in the last 168 hours. Lipid Profile: No results for input(s): "CHOL", "HDL", "LDLCALC", "TRIG", "CHOLHDL", "LDLDIRECT" in the last 72 hours. Thyroid Function Tests: No results for input(s): "TSH",  "T4TOTAL", "FREET4", "T3FREE", "THYROIDAB" in the last 72 hours. Anemia Panel: No results for input(s): "VITAMINB12", "FOLATE", "FERRITIN", "TIBC", "IRON", "RETICCTPCT" in the last 72 hours. Sepsis Labs: Recent Labs  Lab 01/18/23 1814 01/18/23 2048  LATICACIDVEN 1.8 1.3    No results found for this or any previous visit (from the past 240 hour(s)).       Radiology Studies: CT ABDOMEN PELVIS W CONTRAST  Result Date: 01/18/2023 CLINICAL DATA:  Left lower quadrant abdominal pain and vomiting. History of colon cancer. EXAM: CT ABDOMEN AND PELVIS WITH CONTRAST TECHNIQUE: Multidetector CT imaging of the abdomen and pelvis was performed using the standard protocol following bolus administration of intravenous contrast. RADIATION DOSE REDUCTION: This exam was performed according to the departmental dose-optimization program which includes automated exposure control, adjustment of the mA and/or kV according to patient size and/or use of iterative reconstruction technique. CONTRAST:  75mL OMNIPAQUE IOHEXOL 300 MG/ML  SOLN COMPARISON:  CT abdomen pelvis dated 10/31/2022. FINDINGS: Lower chest: The visualized lung bases are clear. No intra-abdominal free air.  Small ascites. Hepatobiliary: Small cyst in the left lobe of the liver. No calcified gallstone. Pancreas: There is significant inflammatory changes of the upper abdominal fat plane surrounding the pancreas consistent with acute pancreatitis. Correlation with pancreatic enzymes recommended. No drainable fluid collection/abscess or pseudocyst. Spleen: Small stable cyst with peripheral calcification. The spleen is otherwise unremarkable. Adrenals/Urinary Tract: The adrenal glands are unremarkable. There is no hydronephrosis on either side. There is symmetric enhancement and excretion of contrast by both kidneys. The visualized ureters and urinary bladder appear unremarkable. Stomach/Bowel: Postsurgical changes of right hemicolectomy with ileotransverse  anastomosis in the upper abdomen. There is no bowel obstruction. Appendectomy. Vascular/Lymphatic: Advanced aortoiliac atherosclerotic disease. There is atherosclerotic calcification of the origin of the SMA with diminished flow within the SMA. The SMA however appears patent. CT angiography may provide better evaluation if there is clinical concern for mesenteric ischemia. The IVC is unremarkable. No portal venous gas. There is no adenopathy. Reproductive: The uterus is anteverted and grossly unremarkable. No adnexal masses. Other: None Musculoskeletal: Osteopenia with degenerative changes of the spine. No acute osseous pathology. IMPRESSION: 1. Acute pancreatitis. No drainable fluid collection/abscess or pseudocyst. 2. Advanced atherosclerotic calcification of the aorta with diminished flow in the SMA which appears patent. CT angiography may provide better evaluation if there is clinical concern for mesenteric ischemia. 3. Postsurgical changes of right hemicolectomy with ileotransverse anastomosis. No bowel obstruction. 4.  Aortic Atherosclerosis (ICD10-I70.0). Electronically Signed   By: Elgie Collard M.D.   On: 01/18/2023 20:24        Scheduled Meds:  apixaban  5 mg Oral BID   clopidogrel  75 mg Oral Daily   diltiazem  240 mg Oral Daily   metoprolol tartrate  25 mg Oral BID   rosuvastatin  40 mg Oral q1800   Continuous Infusions:  sodium chloride 250 mL/hr at 01/19/23 0601     LOS: 1 day    Time spent: 35 minutes  Jumar Greenstreet Hoover Brunette, DO [facility]  If 7PM-7AM, please contact night-coverage [email or web address] 01/19/2023, 7:26 AM

## 2023-01-20 DIAGNOSIS — K858 Other acute pancreatitis without necrosis or infection: Secondary | ICD-10-CM | POA: Diagnosis not present

## 2023-01-20 LAB — FERRITIN: Ferritin: 563 ng/mL — ABNORMAL HIGH (ref 11–307)

## 2023-01-20 LAB — IRON AND TIBC
Iron: 14 ug/dL — ABNORMAL LOW (ref 28–170)
Saturation Ratios: 6 % — ABNORMAL LOW (ref 10.4–31.8)
TIBC: 217 ug/dL — ABNORMAL LOW (ref 250–450)
UIBC: 203 ug/dL

## 2023-01-20 LAB — COMPREHENSIVE METABOLIC PANEL
ALT: 22 U/L (ref 0–44)
AST: 33 U/L (ref 15–41)
Albumin: 2.6 g/dL — ABNORMAL LOW (ref 3.5–5.0)
Alkaline Phosphatase: 52 U/L (ref 38–126)
Anion gap: 9 (ref 5–15)
BUN: 14 mg/dL (ref 8–23)
CO2: 19 mmol/L — ABNORMAL LOW (ref 22–32)
Calcium: 8.3 mg/dL — ABNORMAL LOW (ref 8.9–10.3)
Chloride: 103 mmol/L (ref 98–111)
Creatinine, Ser: 1.1 mg/dL — ABNORMAL HIGH (ref 0.44–1.00)
GFR, Estimated: 50 mL/min — ABNORMAL LOW (ref >60)
Glucose, Bld: 86 mg/dL (ref 70–99)
Potassium: 3.4 mmol/L — ABNORMAL LOW (ref 3.5–5.1)
Sodium: 131 mmol/L — ABNORMAL LOW (ref 135–145)
Total Bilirubin: 1 mg/dL (ref <1.2)
Total Protein: 5.6 g/dL — ABNORMAL LOW (ref 6.5–8.1)

## 2023-01-20 LAB — RETICULOCYTES
Immature Retic Fract: 15 % (ref 2.3–15.9)
RBC.: 4.02 MIL/uL (ref 3.87–5.11)
Retic Count, Absolute: 67.5 10*3/uL (ref 19.0–186.0)
Retic Ct Pct: 1.7 % (ref 0.4–3.1)

## 2023-01-20 LAB — MAGNESIUM: Magnesium: 1.7 mg/dL (ref 1.7–2.4)

## 2023-01-20 LAB — LIPASE, BLOOD: Lipase: 72 U/L — ABNORMAL HIGH (ref 11–51)

## 2023-01-20 LAB — CBC
HCT: 31.6 % — ABNORMAL LOW (ref 36.0–46.0)
Hemoglobin: 10.1 g/dL — ABNORMAL LOW (ref 12.0–15.0)
MCH: 26 pg (ref 26.0–34.0)
MCHC: 32 g/dL (ref 30.0–36.0)
MCV: 81.2 fL (ref 80.0–100.0)
Platelets: 110 10*3/uL — ABNORMAL LOW (ref 150–400)
RBC: 3.89 MIL/uL (ref 3.87–5.11)
RDW: 15.9 % — ABNORMAL HIGH (ref 11.5–15.5)
WBC: 7.2 10*3/uL (ref 4.0–10.5)
nRBC: 0 % (ref 0.0–0.2)

## 2023-01-20 LAB — FOLATE: Folate: 15.9 ng/mL (ref >5.9)

## 2023-01-20 LAB — VITAMIN B12: Vitamin B-12: 482 pg/mL (ref 180–914)

## 2023-01-20 MED ORDER — APIXABAN 2.5 MG PO TABS
2.5000 mg | ORAL_TABLET | Freq: Two times a day (BID) | ORAL | Status: DC
  Administered 2023-01-20 – 2023-01-21 (×2): 2.5 mg via ORAL
  Filled 2023-01-20 (×2): qty 1

## 2023-01-20 MED ORDER — POTASSIUM CHLORIDE CRYS ER 20 MEQ PO TBCR
40.0000 meq | EXTENDED_RELEASE_TABLET | Freq: Two times a day (BID) | ORAL | Status: AC
  Administered 2023-01-20 (×2): 40 meq via ORAL
  Filled 2023-01-20 (×2): qty 2

## 2023-01-20 NOTE — Progress Notes (Signed)
PROGRESS NOTE    Janice Curtis  ZOX:[id number] DOB: [birth year] DOA: 01/18/2023 PCP: [facility]   Brief Narrative:    Janice Curtis is a 82 y.o. female with medical history significant of atrial fibrillation, history of colon cancer, hypertension, obesity, history of stroke.  Patient seen for abdominal pain and nausea and vomiting since Wednesday after eating out at a hamburger place.  She had been admitted for acute pancreatitis as well as associated AKI.  She is overall improving and asking for diet today, but still having a difficult time tolerating.  Assessment & Plan:   Principal Problem:   Acute pancreatitis Active Problems:   Acute renal failure (HCC)   History of malignant neoplasm of large intestine   Persistent atrial fibrillation (HCC)   History of CVA (cerebrovascular accident)  Assessment and Plan:   Acute pancreatitis-improving IVF decreased to 75 cc/h Pain meds Diet advanced to soft Zofran for nausea Lipase downward trending Lipid panel within normal limits CT with no sign of cyst or other complications Denies alcohol use AKI-improving Fluids Recheck SCr in AM A fib Rate controlled, continue diltiazem and metoprolol Continue eliquis H/o CVA H/o colon cancer Hypokalemia-replete Anemia No overt bleeding noted, check anemia panel and repeat CBC in a.m.      DVT prophylaxis:apixaban Code Status: Full Family Communication: None at bedside Disposition Plan:  Status is: Inpatient Remains inpatient appropriate because: Need for ongoing IV fluid  Consultants:  None  Procedures:  None  Antimicrobials:  None   Subjective: Patient seen and evaluated today with some improvement and abdominal pain and denies any nausea or vomiting.  She would like to try to eat more if possible.  Objective: Vitals:   01/19/23 1537 01/19/23 2043 01/20/23 0432 01/20/23 0828  BP: (!) 117/50 118/61 120/63 112/60  Pulse: 77 74 80 79  Resp:  19 18 19    Temp: 98.4 F (36.9 C) 98.8 F (37.1 C) 98.5 F (36.9 C)   TempSrc: Oral Oral Oral   SpO2: 97% 96% 100%   Weight:      Height:        Intake/Output Summary (Last 24 hours) at 01/20/2023 1412 Last data filed at 01/20/2023 0900 Gross per 24 hour  Intake 881.64 ml  Output --  Net 881.64 ml   Filed Weights   01/18/23 1712 01/18/23 2300 01/19/23 1024  Weight: 58.5 kg 58.3 kg 59.8 kg    Examination:  General exam: Appears calm and comfortable  Respiratory system: Clear to auscultation. Respiratory effort normal. Cardiovascular system: S1 & S2 heard, RRR.  Gastrointestinal system: Abdomen is soft Central nervous system: Alert and awake Extremities: No edema Skin: No significant lesions noted Psychiatry: Flat affect.    Data Reviewed: I have personally reviewed following labs and imaging studies  CBC: Recent Labs  Lab 01/18/23 1743 01/20/23 0412  WBC 14.4* 7.2  NEUTROABS 12.2*  --   HGB 14.1 10.1*  HCT 42.9 31.6*  MCV 78.7* 81.2  PLT 143* 110*   Basic Metabolic Panel: Recent Labs  Lab 01/18/23 1743 01/19/23 0437 01/20/23 0412  NA 131* 133* 131*  K 4.9 4.2 3.4*  CL 102 104 103  CO2 20* 20* 19*  GLUCOSE 121* 93 86  BUN 28* 22 14  CREATININE 1.55* 1.27* 1.10*  CALCIUM 9.2 8.5* 8.3*  MG  --   --  1.7   GFR: Estimated Creatinine Clearance: 33.2 mL/min (A) (by C-G formula based on SCr of 1.1 mg/dL (H)). Liver Function  Tests: Recent Labs  Lab 01/18/23 1743 01/20/23 0412  AST 25 33  ALT 22 22  ALKPHOS 52 52  BILITOT 1.5* 1.0  PROT 7.1 5.6*  ALBUMIN 3.5 2.6*   Recent Labs  Lab 01/18/23 1743 01/19/23 0437 01/20/23 0412  LIPASE 211* 130* 72*   No results for input(s): "AMMONIA" in the last 168 hours. Coagulation Profile: No results for input(s): "INR", "PROTIME" in the last 168 hours. Cardiac Enzymes: No results for input(s): "CKTOTAL", "CKMB", "CKMBINDEX", "TROPONINI" in the last 168 hours. BNP (last 3 results) No results for  input(s): "PROBNP" in the last 8760 hours. HbA1C: No results for input(s): "HGBA1C" in the last 72 hours. CBG: No results for input(s): "GLUCAP" in the last 168 hours. Lipid Profile: Recent Labs    01/19/23 1105  CHOL 83  HDL 37*  LDLCALC 37  TRIG 46  CHOLHDL 2.2   Thyroid Function Tests: No results for input(s): "TSH", "T4TOTAL", "FREET4", "T3FREE", "THYROIDAB" in the last 72 hours. Anemia Panel: No results for input(s): "VITAMINB12", "FOLATE", "FERRITIN", "TIBC", "IRON", "RETICCTPCT" in the last 72 hours. Sepsis Labs: Recent Labs  Lab 01/18/23 1814 01/18/23 2048  LATICACIDVEN 1.8 1.3    No results found for this or any previous visit (from the past 240 hour(s)).       Radiology Studies: CT ABDOMEN PELVIS W CONTRAST  Result Date: 01/18/2023 CLINICAL DATA:  Left lower quadrant abdominal pain and vomiting. History of colon cancer. EXAM: CT ABDOMEN AND PELVIS WITH CONTRAST TECHNIQUE: Multidetector CT imaging of the abdomen and pelvis was performed using the standard protocol following bolus administration of intravenous contrast. RADIATION DOSE REDUCTION: This exam was performed according to the departmental dose-optimization program which includes automated exposure control, adjustment of the mA and/or kV according to patient size and/or use of iterative reconstruction technique. CONTRAST:  75mL OMNIPAQUE IOHEXOL 300 MG/ML  SOLN COMPARISON:  CT abdomen pelvis dated 10/31/2022. FINDINGS: Lower chest: The visualized lung bases are clear. No intra-abdominal free air.  Small ascites. Hepatobiliary: Small cyst in the left lobe of the liver. No calcified gallstone. Pancreas: There is significant inflammatory changes of the upper abdominal fat plane surrounding the pancreas consistent with acute pancreatitis. Correlation with pancreatic enzymes recommended. No drainable fluid collection/abscess or pseudocyst. Spleen: Small stable cyst with peripheral calcification. The spleen is  otherwise unremarkable. Adrenals/Urinary Tract: The adrenal glands are unremarkable. There is no hydronephrosis on either side. There is symmetric enhancement and excretion of contrast by both kidneys. The visualized ureters and urinary bladder appear unremarkable. Stomach/Bowel: Postsurgical changes of right hemicolectomy with ileotransverse anastomosis in the upper abdomen. There is no bowel obstruction. Appendectomy. Vascular/Lymphatic: Advanced aortoiliac atherosclerotic disease. There is atherosclerotic calcification of the origin of the SMA with diminished flow within the SMA. The SMA however appears patent. CT angiography may provide better evaluation if there is clinical concern for mesenteric ischemia. The IVC is unremarkable. No portal venous gas. There is no adenopathy. Reproductive: The uterus is anteverted and grossly unremarkable. No adnexal masses. Other: None Musculoskeletal: Osteopenia with degenerative changes of the spine. No acute osseous pathology. IMPRESSION: 1. Acute pancreatitis. No drainable fluid collection/abscess or pseudocyst. 2. Advanced atherosclerotic calcification of the aorta with diminished flow in the SMA which appears patent. CT angiography may provide better evaluation if there is clinical concern for mesenteric ischemia. 3. Postsurgical changes of right hemicolectomy with ileotransverse anastomosis. No bowel obstruction. 4.  Aortic Atherosclerosis (ICD10-I70.0). Electronically Signed   By: Elgie Collard M.D.   On: 01/18/2023 20:24  Scheduled Meds:  apixaban  2.5 mg Oral BID   clopidogrel  75 mg Oral Daily   diltiazem  240 mg Oral Daily   feeding supplement  1 Container Oral TID BM   metoprolol tartrate  25 mg Oral BID   potassium chloride  40 mEq Oral BID   rosuvastatin  40 mg Oral q1800   Continuous Infusions:  sodium chloride 100 mL/hr at 01/20/23 0715     LOS: 2 days    Time spent: 35 minutes    Taziyah Iannuzzi D Sherryll Burger, DO [facility]  If  7PM-7AM, please contact night-coverage [email or web address] 01/20/2023, 2:12 PM

## 2023-01-20 NOTE — Plan of Care (Signed)
Rested well during the night. Received tylenol once during the shift for left flank pain and had relief. No nausea & no vomiting during the night. No abd pain.   Problem: Education: Goal: Knowledge of General Education information will improve Description: Including pain rating scale, medication(s)/side effects and non-pharmacologic comfort measures Outcome: Progressing   Problem: Health Behavior/Discharge Planning: Goal: Ability to manage health-related needs will improve Outcome: Progressing   Problem: Clinical Measurements: Goal: Ability to maintain clinical measurements within normal limits will improve Outcome: Progressing Goal: Will remain free from infection Outcome: Progressing Goal: Diagnostic test results will improve Outcome: Progressing Goal: Respiratory complications will improve Outcome: Progressing Goal: Cardiovascular complication will be avoided Outcome: Progressing   Problem: Activity: Goal: Risk for activity intolerance will decrease Outcome: Progressing   Problem: Nutrition: Goal: Adequate nutrition will be maintained Outcome: Progressing   Problem: Coping: Goal: Level of anxiety will decrease Outcome: Progressing   Problem: Elimination: Goal: Will not experience complications related to bowel motility Outcome: Progressing Goal: Will not experience complications related to urinary retention Outcome: Progressing   Problem: Pain Management: Goal: General experience of comfort will improve Outcome: Progressing   Problem: Safety: Goal: Ability to remain free from injury will improve Outcome: Progressing   Problem: Skin Integrity: Goal: Risk for impaired skin integrity will decrease Outcome: Progressing

## 2023-01-21 DIAGNOSIS — K858 Other acute pancreatitis without necrosis or infection: Secondary | ICD-10-CM | POA: Diagnosis not present

## 2023-01-21 LAB — CBC
HCT: 31.6 % — ABNORMAL LOW (ref 36.0–46.0)
Hemoglobin: 10.2 g/dL — ABNORMAL LOW (ref 12.0–15.0)
MCH: 26 pg (ref 26.0–34.0)
MCHC: 32.3 g/dL (ref 30.0–36.0)
MCV: 80.4 fL (ref 80.0–100.0)
Platelets: 136 10*3/uL — ABNORMAL LOW (ref 150–400)
RBC: 3.93 MIL/uL (ref 3.87–5.11)
RDW: 15.8 % — ABNORMAL HIGH (ref 11.5–15.5)
WBC: 5.8 10*3/uL (ref 4.0–10.5)
nRBC: 0 % (ref 0.0–0.2)

## 2023-01-21 LAB — BASIC METABOLIC PANEL
Anion gap: 6 (ref 5–15)
BUN: 9 mg/dL (ref 8–23)
CO2: 20 mmol/L — ABNORMAL LOW (ref 22–32)
Calcium: 8.4 mg/dL — ABNORMAL LOW (ref 8.9–10.3)
Chloride: 106 mmol/L (ref 98–111)
Creatinine, Ser: 1.1 mg/dL — ABNORMAL HIGH (ref 0.44–1.00)
GFR, Estimated: 50 mL/min — ABNORMAL LOW (ref >60)
Glucose, Bld: 98 mg/dL (ref 70–99)
Potassium: 3.9 mmol/L (ref 3.5–5.1)
Sodium: 132 mmol/L — ABNORMAL LOW (ref 135–145)

## 2023-01-21 LAB — MAGNESIUM: Magnesium: 1.7 mg/dL (ref 1.7–2.4)

## 2023-01-21 MED ORDER — DILTIAZEM HCL ER 240 MG PO TB24: 240.0000 mg | ORAL_TABLET | Freq: Every day | ORAL | 1 refills | Status: DC

## 2023-01-21 MED ORDER — ROSUVASTATIN CALCIUM 40 MG PO TABS: 40.0000 mg | ORAL_TABLET | Freq: Every day | ORAL | 0 refills | Status: AC

## 2023-01-21 MED ORDER — ONDANSETRON HCL 4 MG PO TABS: 4.0000 mg | ORAL_TABLET | Freq: Four times a day (QID) | ORAL | 0 refills | Status: DC | PRN

## 2023-01-21 NOTE — Plan of Care (Signed)

## 2023-01-21 NOTE — Care Management Important Message (Signed)
Important Message  Patient Details  Name: Janice Curtis MRN: [id number] Date of Birth: 1940-08-06   Important Message Given:  N/A - LOS <3 / Initial given by admissions     Corey Harold 01/21/2023, 10:28 AM

## 2023-01-21 NOTE — Discharge Summary (Signed)
Physician Discharge Summary  Janice Curtis ZOX:[id number] DOB: 07-02-1940 DOA: 01/18/2023  PCP: [facility]  Admit date: 01/18/2023  Discharge date: 01/21/2023  Admitted From:Home  Disposition:  Home  Recommendations for Outpatient Follow-up:  Follow up with PCP in 1-2 weeks Consider starting on iron supplementation outpatient after PCP evaluation given iron deficiency anemia Continue on home medications as prior Now tolerating diet, but Zofran prescribed as needed for nausea or vomiting  Home Health: None  Equipment/Devices: None  Discharge Condition:Stable  CODE STATUS: Full  Diet recommendation: Heart Healthy  Brief/Interim Summary:  Janice Curtis is a 82 y.o. female with medical history significant of atrial fibrillation, history of colon cancer, hypertension, obesity, history of stroke.  Patient seen for abdominal pain and nausea and vomiting since Wednesday after eating out at a hamburger place.  She had been admitted for acute pancreatitis as well as associated AKI.  She is now improved and tolerating diet with no further nausea or vomiting.  She is in stable condition for discharge today with no other acute events or concerns noted.  Discharge Diagnoses:  Principal Problem:   Acute pancreatitis Active Problems:   Acute renal failure (HCC)   History of malignant neoplasm of large intestine   Persistent atrial fibrillation (HCC)   History of CVA (cerebrovascular accident)  Principal discharge diagnosis: Acute pancreatitis likely in the setting of fatty food intake.  Discharge Instructions  Discharge Instructions     Diet - low sodium heart healthy   Complete by: As directed    Increase activity slowly   Complete by: As directed       Allergies as of 01/21/2023   No Known Allergies      Medication List     TAKE these medications    apixaban 5 MG Tabs tablet Commonly known as: ELIQUIS Take 1 tablet (5 mg total) by mouth 2  (two) times daily.   clopidogrel 75 MG tablet Commonly known as: PLAVIX Take 75 mg by mouth daily.   diclofenac Sodium 1 % Gel Commonly known as: Voltaren Apply 2 g topically in the morning and at bedtime.   diltiazem 240 MG 24 hr tablet Commonly known as: Cardizem LA Take 1 tablet (240 mg total) by mouth daily.   ezetimibe 10 MG tablet Commonly known as: Zetia Take 1 tablet (10 mg total) by mouth daily.   metoprolol tartrate 25 MG tablet Commonly known as: LOPRESSOR Take 25 mg by mouth 2 (two) times daily.   ondansetron 4 MG tablet Commonly known as: ZOFRAN Take 1 tablet (4 mg total) by mouth every 6 (six) hours as needed for nausea.   pantoprazole 40 MG tablet Commonly known as: PROTONIX Take 1 tablet (40 mg total) by mouth at bedtime.   potassium chloride SA 20 MEQ tablet Commonly known as: KLOR-CON M Take 2 tablets (40 mEq total) by mouth daily. What changed: when to take this   rosuvastatin 40 MG tablet Commonly known as: CRESTOR Place 1 tablet (40 mg total) into feeding tube daily.   VITAMIN D (CHOLECALCIFEROL) PO Take 1 tablet by mouth daily.        Follow-up Information     [facility] [employer] [facility]. Schedule an appointment as soon as possible for a visit in 1 week(s).   Specialty: Family Medicine Contact information: [address] [place] [address] [phone]                No Known Allergies  Consultations: None  Procedures/Studies: CT ABDOMEN PELVIS W CONTRAST  Result Date: 01/18/2023 CLINICAL DATA:  Left lower quadrant abdominal pain and vomiting. History of colon cancer. EXAM: CT ABDOMEN AND PELVIS WITH CONTRAST TECHNIQUE: Multidetector CT imaging of the abdomen and pelvis was performed using the standard protocol following bolus administration of intravenous contrast. RADIATION DOSE REDUCTION: This exam was performed according to the departmental dose-optimization program which includes automated exposure  control, adjustment of the mA and/or kV according to patient size and/or use of iterative reconstruction technique. CONTRAST:  75mL OMNIPAQUE IOHEXOL 300 MG/ML  SOLN COMPARISON:  CT abdomen pelvis dated 10/31/2022. FINDINGS: Lower chest: The visualized lung bases are clear. No intra-abdominal free air.  Small ascites. Hepatobiliary: Small cyst in the left lobe of the liver. No calcified gallstone. Pancreas: There is significant inflammatory changes of the upper abdominal fat plane surrounding the pancreas consistent with acute pancreatitis. Correlation with pancreatic enzymes recommended. No drainable fluid collection/abscess or pseudocyst. Spleen: Small stable cyst with peripheral calcification. The spleen is otherwise unremarkable. Adrenals/Urinary Tract: The adrenal glands are unremarkable. There is no hydronephrosis on either side. There is symmetric enhancement and excretion of contrast by both kidneys. The visualized ureters and urinary bladder appear unremarkable. Stomach/Bowel: Postsurgical changes of right hemicolectomy with ileotransverse anastomosis in the upper abdomen. There is no bowel obstruction. Appendectomy. Vascular/Lymphatic: Advanced aortoiliac atherosclerotic disease. There is atherosclerotic calcification of the origin of the SMA with diminished flow within the SMA. The SMA however appears patent. CT angiography may provide better evaluation if there is clinical concern for mesenteric ischemia. The IVC is unremarkable. No portal venous gas. There is no adenopathy. Reproductive: The uterus is anteverted and grossly unremarkable. No adnexal masses. Other: None Musculoskeletal: Osteopenia with degenerative changes of the spine. No acute osseous pathology. IMPRESSION: 1. Acute pancreatitis. No drainable fluid collection/abscess or pseudocyst. 2. Advanced atherosclerotic calcification of the aorta with diminished flow in the SMA which appears patent. CT angiography may provide better evaluation if  there is clinical concern for mesenteric ischemia. 3. Postsurgical changes of right hemicolectomy with ileotransverse anastomosis. No bowel obstruction. 4.  Aortic Atherosclerosis (ICD10-I70.0). Electronically Signed   By: Elgie Collard M.D.   On: 01/18/2023 20:24   VAS [place] TRANSCRANIAL DOPPLER  Result Date: 01/08/2023  Transcranial Doppler Patient Name:  ALIONNA CLINGERMAN  Date of Exam:   01/07/2023 Medical Rec #: [id number]        Accession #:    [id number] Date of Birth: 1940-12-12        Patient Gender: F Patient Age:   70 years Exam Location:  [facility] Procedure:      VAS [place] TRANSCRANIAL DOPPLER Referring Phys: PRAMOD SETHI --------------------------------------------------------------------------------  Indications: Speech difficulties. Comparison Study: 12/06/2009 - RIGHT CAROTID ARTERY: Mild plaque formation right                   CCA and right                   carotid bulb extending into proximal right ICA. Turbulent                   blood                   flow right carotid bulb and proximal right ICA and ECA.                   Spectral  broadening right ICA on waveform analysis. Increased velocity                   right ECA.                    RIGHT VERTEBRAL ARTERY: Patent, antegrade.                    LEFT CAROTID ARTERY: Intimal thickening and minimal plaque                   formation left CCA. Scattered plaques left carotid bulb into                   proximal left ICA. Mildly turbulent flow proximal left ECA and                   ICA                   with spectral broadening left ICA on waveform analysis.                   Increased                   velocities left ECA and ICA.                    LEFT VERTEBRAL ARTERY: Patent, antegrade                    IMPRESSION:                    Plaque formation bilaterally at the carotid bifurcations and                   proximal left greater than right ICAs.                   Increased peak systolic velocity and  turbulence in the                   proximal                   left ICA, obtained values corresponding to a 50-69% diameter                   stenosis.                   Additional increased velocities within the proximal external                   carotid arteries bilaterally.                    12/09/2021 - CTA Neck                   IMPRESSION:                   1. Proximal left M1 occlusion with limited collaterals within                   the                   anterior aspect of the left MCA distribution.                   2. Moderate stenosis of the right M1 segment.  3. Segmental narrowing within distal right MCA branch vessels                   without a significant proximal stenosis or occlusion.                   4. Moderate stenosis of the proximal right P2 segment.                   5. Occlusion of the proximal left vertebral artery with                   reconstitution at the C4 level. It is occluded again at the                   C2-3                   level.                   6. High-grade stenoses of the proximal internal carotid                   arteries                   bilaterally at the carotid bifurcations.                   7. Moderate stenosis at the origin of the right vertebral                   artery. Performing Technologist: Chanda Busing RVT  Examination Guidelines: A complete evaluation includes B-mode imaging, spectral Doppler, color Doppler, and power Doppler as needed of all accessible portions of each vessel. Bilateral testing is considered an integral part of a complete examination. Limited examinations for reoccurring indications may be performed as noted.  +----------+---------------+----------+-----------+-------+ RIGHT TCD Right VM (cm/s)Depth (cm)PulsatilityComment +----------+---------------+----------+-----------+-------+ MCA             104                   0.54            +----------+---------------+----------+-----------+-------+ ACA              -31                   1.17            +----------+---------------+----------+-----------+-------+ Term ICA        35                     0.9            +----------+---------------+----------+-----------+-------+ PCA P1          32                    1.11            +----------+---------------+----------+-----------+-------+ Opthalmic       10                    0.78            +----------+---------------+----------+-----------+-------+ ICA siphon      28                    0.75            +----------+---------------+----------+-----------+-------+ Vertebral       -27  1.02            +----------+---------------+----------+-----------+-------+  +----------+--------------+----------+-----------+------------------+ LEFT TCD  Left VM (cm/s)Depth (cm)Pulsatility     Comment       +----------+--------------+----------+-----------+------------------+ MCA             57                   1.04                       +----------+--------------+----------+-----------+------------------+ ACA                                          Unable to insonate +----------+--------------+----------+-----------+------------------+ Term ICA        51                   1.08                       +----------+--------------+----------+-----------+------------------+ PCA P1          23                   1.29                       +----------+--------------+----------+-----------+------------------+ Opthalmic       11                   1.23                       +----------+--------------+----------+-----------+------------------+ ICA siphon                                   Unable to insonate +----------+--------------+----------+-----------+------------------+ Vertebral                                    Unable to insonate +----------+--------------+----------+-----------+------------------+  +------------+-------+------------------+              VM cm/s     Comment       +------------+-------+------------------+ Prox Basilar  -33         0.84        +------------+-------+------------------+ Dist Basilar       Unable to insonate +------------+-------+------------------+ Summary:  Elevated mean flow velocities in left middle cerebral artery suggests mild stenosis. Not all vesssels could be studied due to poor left temporal and suboccipital windows. *See table(s) above for TCD measurements and observations.  Diagnosing physician: Delia Heady MD Electronically signed by Delia Heady MD on 01/08/2023 at 11:19:25 AM.    Final    VAS US CAROTID  Result Date: 01/08/2023 Carotid Arterial Duplex Study Patient Name:  PAIZLIE KAUFFMANN  Date of Exam:   01/07/2023 Medical Rec #: [id number]        Accession #:    [id number] Date of Birth: 11/10/1940        Patient Gender: F Patient Age:   79 years Exam Location:  [facility] Procedure:      VAS US CAROTID Referring Phys: PRAMOD SETHI --------------------------------------------------------------------------------  Indications:       Atherosclerosis of the carotid artery, unspecified I65.29. Risk Factors:      Hypertension, hyperlipidemia. Comparison  Study:  12/06/2009 - RIGHT CAROTID ARTERY: Mild plaque formation right                    CCA and right                    carotid bulb extending into proximal right ICA. Turbulent                    blood                    flow right carotid bulb and proximal right ICA and ECA.                    Spectral                    broadening right ICA on waveform analysis. Increased velocity                    right ECA.                     RIGHT VERTEBRAL ARTERY: Patent, antegrade.                     LEFT CAROTID ARTERY: Intimal thickening and minimal plaque                    formation left CCA. Scattered plaques left carotid bulb into                    proximal left ICA. Mildly turbulent flow proximal left ECA                    and ICA                     with spectral broadening left ICA on waveform analysis.                    Increased                    velocities left ECA and ICA.                     LEFT VERTEBRAL ARTERY: Patent, antegrade                     IMPRESSION:                     Plaque formation bilaterally at the carotid bifurcations and                    proximal left greater than right ICAs.                    Increased peak systolic velocity and turbulence in the                    proximal                    left ICA, obtained values corresponding to a 50-69% diameter                    stenosis.                    Additional increased velocities within the proximal external  carotid arteries bilaterally.                     12/09/2021 - CTA Neck                    IMPRESSION:                    1. Proximal left M1 occlusion with limited collaterals within                    the                    anterior aspect of the left MCA distribution.                    2. Moderate stenosis of the right M1 segment.                    3. Segmental narrowing within distal right MCA branch vessels                    without a significant proximal stenosis or occlusion.                    4. Moderate stenosis of the proximal right P2 segment.                    5. Occlusion of the proximal left vertebral artery with                    reconstitution at the C4 level. It is occluded again at the                    C2-3                    level.                    6. High-grade stenoses of the proximal internal carotid                    arteries                    bilaterally at the carotid bifurcations.                    7. Moderate stenosis at the origin of the right vertebral                    artery. Performing Technologist: Chanda Busing RVT  Examination Guidelines: A complete evaluation includes B-mode imaging, spectral Doppler, color Doppler, and power Doppler as needed of all accessible portions of each vessel. Bilateral  testing is considered an integral part of a complete examination. Limited examinations for reoccurring indications may be performed as noted.  Right Carotid Findings: +----------+--------+--------+--------+--------------------------+--------+           PSV cm/sEDV cm/sStenosisPlaque Description        Comments +----------+--------+--------+--------+--------------------------+--------+ CCA Prox  37      7               smooth and heterogenous            +----------+--------+--------+--------+--------------------------+--------+ CCA Distal24      7               heterogenous and irregular         +----------+--------+--------+--------+--------------------------+--------+  ICA Prox  257     62      60-79%  irregular and calcific             +----------+--------+--------+--------+--------------------------+--------+ ICA Mid   70      34      40-59%                                     +----------+--------+--------+--------+--------------------------+--------+ ICA Distal65      24                                        tortuous +----------+--------+--------+--------+--------------------------+--------+ ECA       43      6                                                  +----------+--------+--------+--------+--------------------------+--------+ +----------+--------+-------+--------+-------------------+           PSV cm/sEDV cmsDescribeArm Pressure (mmHG) +----------+--------+-------+--------+-------------------+ Subclavian113                                        +----------+--------+-------+--------+-------------------+ +---------+--------+--+--------+--+---------+ VertebralPSV cm/s52EDV cm/s19Antegrade +---------+--------+--+--------+--+---------+  Left Carotid Findings: +----------+--------+--------+--------+-----------------------+--------+           PSV cm/sEDV cm/sStenosisPlaque Description     Comments  +----------+--------+--------+--------+-----------------------+--------+ CCA Prox  40      9               smooth and heterogenous         +----------+--------+--------+--------+-----------------------+--------+ CCA Distal43      10              smooth and heterogenous         +----------+--------+--------+--------+-----------------------+--------+ ICA Prox  87      31              calcific                        +----------+--------+--------+--------+-----------------------+--------+ ICA Mid   160     48      40-59%                         tortuous +----------+--------+--------+--------+-----------------------+--------+ ICA Distal101     36                                     tortuous +----------+--------+--------+--------+-----------------------+--------+ ECA       56      6                                               +----------+--------+--------+--------+-----------------------+--------+ +----------+--------+--------+--------+-------------------+           PSV cm/sEDV cm/sDescribeArm Pressure (mmHG) +----------+--------+--------+--------+-------------------+ [id number]                                         +----------+--------+--------+--------+-------------------+ +---------+--------+--+--------+-+---------+  VertebralPSV cm/s79EDV cm/s0Antegrade +---------+--------+--+--------+-+---------+   Summary: Right Carotid: Velocities in the right ICA are consistent with a 60-79%                stenosis. Left Carotid: Velocities in the left ICA are consistent with a 40-59% stenosis. Vertebrals: Bilateral vertebral arteries demonstrate antegrade flow. *See table(s) above for measurements and observations.  Electronically signed by Delia Heady MD on 01/08/2023 at 11:17:33 AM.    Final      Discharge Exam: Vitals:   01/20/23 1435 01/20/23 1945  BP: 131/69 119/61  Pulse: 61 78  Resp: 20 18  Temp: 98.3 F (36.8 C) 98.8 F (37.1 C)  SpO2: 100%  100%   Vitals:   01/20/23 0432 01/20/23 0828 01/20/23 1435 01/20/23 1945  BP: 120/63 112/60 131/69 119/61  Pulse: 80 79 61 78  Resp: 19  20 18   Temp: 98.5 F (36.9 C)  98.3 F (36.8 C) 98.8 F (37.1 C)  TempSrc: Oral  Oral Oral  SpO2: 100%  100% 100%  Weight:      Height:        General: Pt is alert, awake, not in acute distress Cardiovascular: RRR, S1/S2 +, no rubs, no gallops Respiratory: CTA bilaterally, no wheezing, no rhonchi Abdominal: Soft, NT, ND, bowel sounds + Extremities: no edema, no cyanosis    The results of significant diagnostics from this hospitalization (including imaging, microbiology, ancillary and laboratory) are listed below for reference.     Microbiology: No results found for this or any previous visit (from the past 240 hour(s)).   Labs: BNP (last 3 results) No results for input(s): "BNP" in the last 8760 hours. Basic Metabolic Panel: Recent Labs  Lab 01/18/23 1743 01/19/23 0437 01/20/23 0412 01/21/23 0439  NA 131* 133* 131* 132*  K 4.9 4.2 3.4* 3.9  CL 102 104 103 106  CO2 20* 20* 19* 20*  GLUCOSE 121* 93 86 98  BUN 28* 22 14 9   CREATININE 1.55* 1.27* 1.10* 1.10*  CALCIUM 9.2 8.5* 8.3* 8.4*  MG  --   --  1.7 1.7   Liver Function Tests: Recent Labs  Lab 01/18/23 1743 01/20/23 0412  AST 25 33  ALT 22 22  ALKPHOS 52 52  BILITOT 1.5* 1.0  PROT 7.1 5.6*  ALBUMIN 3.5 2.6*   Recent Labs  Lab 01/18/23 1743 01/19/23 0437 01/20/23 0412  LIPASE 211* 130* 72*   No results for input(s): "AMMONIA" in the last 168 hours. CBC: Recent Labs  Lab 01/18/23 1743 01/20/23 0412 01/21/23 0439  WBC 14.4* 7.2 5.8  NEUTROABS 12.2*  --   --   HGB 14.1 10.1* 10.2*  HCT 42.9 31.6* 31.6*  MCV 78.7* 81.2 80.4  PLT 143* 110* 136*   Cardiac Enzymes: No results for input(s): "CKTOTAL", "CKMB", "CKMBINDEX", "TROPONINI" in the last 168 hours. BNP: Invalid input(s): "POCBNP" CBG: No results for input(s): "GLUCAP" in the last 168  hours. D-Dimer No results for input(s): "DDIMER" in the last 72 hours. Hgb A1c No results for input(s): "HGBA1C" in the last 72 hours. Lipid Profile Recent Labs    01/19/23 1105  CHOL 83  HDL 37*  LDLCALC 37  TRIG 46  CHOLHDL 2.2   Thyroid function studies No results for input(s): "TSH", "T4TOTAL", "T3FREE", "THYROIDAB" in the last 72 hours.  Invalid input(s): "FREET3" Anemia work up Recent Labs    01/20/23 1428  VITAMINB12 482  FOLATE 15.9  FERRITIN 563*  TIBC 217*  IRON 14*  RETICCTPCT 1.7  Urinalysis    Component Value Date/Time   COLORURINE YELLOW 01/18/2023 2102   APPEARANCEUR HAZY (A) 01/18/2023 2102   LABSPEC 1.041 (H) 01/18/2023 2102   PHURINE 6.0 01/18/2023 2102   GLUCOSEU NEGATIVE 01/18/2023 2102   HGBUR SMALL (A) 01/18/2023 2102   BILIRUBINUR NEGATIVE 01/18/2023 2102   KETONESUR NEGATIVE 01/18/2023 2102   PROTEINUR 100 (A) 01/18/2023 2102   UROBILINOGEN 0.2 11/11/2011 1026   NITRITE NEGATIVE 01/18/2023 2102   LEUKOCYTESUR NEGATIVE 01/18/2023 2102   Sepsis Labs Recent Labs  Lab 01/18/23 1743 01/20/23 0412 01/21/23 0439  WBC 14.4* 7.2 5.8   Microbiology No results found for this or any previous visit (from the past 240 hour(s)).   Time coordinating discharge: 35 minutes  SIGNED:   Erick Blinks, DO [facility] 01/21/2023, 9:49 AM  If 7PM-7AM, please contact night-coverage [email or web address]

## 2023-01-21 NOTE — Progress Notes (Signed)
Transition of Care Department [facility]) has reviewed patient and no TOC needs have been identified at this time. We will continue to monitor patient advancement through interdisciplinary progression rounds. If new patient transition needs arise, please place a TOC consult.   01/21/23 0800  TOC Brief Assessment  Insurance and Status Reviewed  Patient has primary care physician Yes  Home environment has been reviewed Lives with daughter and grandson.  Prior level of function: Fairly independent.  Prior/Current Home Services No current home services  Social Determinants of Health Reivew SDOH reviewed no interventions necessary  Readmission risk has been reviewed Yes  Transition of care needs no transition of care needs at this time

## 2023-01-21 NOTE — Progress Notes (Signed)
Went over discharge instructions w/ pt.

## 2023-01-21 NOTE — Progress Notes (Signed)
Mobility Specialist Progress Note:    01/21/23 1100  Mobility  Activity Ambulated with assistance to bathroom;Stood at bedside  Level of Assistance Minimal assist, patient does 75% or more  Assistive Device Front wheel walker  Distance Ambulated (ft) 10 ft  Range of Motion/Exercises Active;All extremities  Activity Response Tolerated well  Mobility Referral Yes  $Mobility charge 1 Mobility  Mobility Specialist Start Time (ACUTE ONLY) 1030  Mobility Specialist Stop Time (ACUTE ONLY) 1050  Mobility Specialist Time Calculation (min) (ACUTE ONLY) 20 min   Pt received in bathroom, required MinA to stand and CGA to ambulate with RW. Tolerated well, pt has anxiety of falling. Pt refused further ambulation. Left in chair, alarm on. Call bell in reach, all needs met.   Lawerance Bach Mobility Specialist Please contact via Special educational needs teacher or  Rehab office at [phone]

## 2023-01-25 DIAGNOSIS — K859 Acute pancreatitis without necrosis or infection, unspecified: Secondary | ICD-10-CM | POA: Diagnosis not present

## 2023-01-25 DIAGNOSIS — Z6823 Body mass index (BMI) 23.0-23.9, adult: Secondary | ICD-10-CM | POA: Diagnosis not present

## 2023-02-23 ENCOUNTER — Other Ambulatory Visit: Payer: Self-pay | Admitting: Neurology

## 2023-03-14 DIAGNOSIS — R7309 Other abnormal glucose: Secondary | ICD-10-CM | POA: Diagnosis not present

## 2023-03-14 DIAGNOSIS — R0989 Other specified symptoms and signs involving the circulatory and respiratory systems: Secondary | ICD-10-CM | POA: Diagnosis not present

## 2023-03-14 DIAGNOSIS — R6881 Early satiety: Secondary | ICD-10-CM | POA: Diagnosis not present

## 2023-03-14 DIAGNOSIS — I10 Essential (primary) hypertension: Secondary | ICD-10-CM | POA: Diagnosis not present

## 2023-03-14 DIAGNOSIS — Z6823 Body mass index (BMI) 23.0-23.9, adult: Secondary | ICD-10-CM | POA: Diagnosis not present

## 2023-03-14 DIAGNOSIS — I69351 Hemiplegia and hemiparesis following cerebral infarction affecting right dominant side: Secondary | ICD-10-CM | POA: Diagnosis not present

## 2023-03-14 DIAGNOSIS — Z0001 Encounter for general adult medical examination with abnormal findings: Secondary | ICD-10-CM | POA: Diagnosis not present

## 2023-03-14 DIAGNOSIS — I639 Cerebral infarction, unspecified: Secondary | ICD-10-CM | POA: Diagnosis not present

## 2023-03-14 DIAGNOSIS — I361 Nonrheumatic tricuspid (valve) insufficiency: Secondary | ICD-10-CM | POA: Diagnosis not present

## 2023-03-14 DIAGNOSIS — Z1331 Encounter for screening for depression: Secondary | ICD-10-CM | POA: Diagnosis not present

## 2023-03-14 DIAGNOSIS — R634 Abnormal weight loss: Secondary | ICD-10-CM | POA: Diagnosis not present

## 2023-03-14 DIAGNOSIS — I4891 Unspecified atrial fibrillation: Secondary | ICD-10-CM | POA: Diagnosis not present

## 2023-03-25 ENCOUNTER — Ambulatory Visit: Payer: Medicare HMO | Admitting: Neurology

## 2023-05-08 DIAGNOSIS — R32 Unspecified urinary incontinence: Secondary | ICD-10-CM | POA: Diagnosis not present

## 2023-05-08 DIAGNOSIS — D6869 Other thrombophilia: Secondary | ICD-10-CM | POA: Diagnosis not present

## 2023-05-08 DIAGNOSIS — K5900 Constipation, unspecified: Secondary | ICD-10-CM | POA: Diagnosis not present

## 2023-05-08 DIAGNOSIS — I4891 Unspecified atrial fibrillation: Secondary | ICD-10-CM | POA: Diagnosis not present

## 2023-05-08 DIAGNOSIS — Z7902 Long term (current) use of antithrombotics/antiplatelets: Secondary | ICD-10-CM | POA: Diagnosis not present

## 2023-05-08 DIAGNOSIS — E785 Hyperlipidemia, unspecified: Secondary | ICD-10-CM | POA: Diagnosis not present

## 2023-05-08 DIAGNOSIS — E876 Hypokalemia: Secondary | ICD-10-CM | POA: Diagnosis not present

## 2023-05-08 DIAGNOSIS — Z7901 Long term (current) use of anticoagulants: Secondary | ICD-10-CM | POA: Diagnosis not present

## 2023-05-08 DIAGNOSIS — I69351 Hemiplegia and hemiparesis following cerebral infarction affecting right dominant side: Secondary | ICD-10-CM | POA: Diagnosis not present

## 2023-05-08 DIAGNOSIS — E559 Vitamin D deficiency, unspecified: Secondary | ICD-10-CM | POA: Diagnosis not present

## 2023-05-08 DIAGNOSIS — I10 Essential (primary) hypertension: Secondary | ICD-10-CM | POA: Diagnosis not present

## 2023-08-06 ENCOUNTER — Other Ambulatory Visit: Payer: Self-pay | Admitting: Neurology

## 2023-09-18 NOTE — Progress Notes (Unsigned)
 Cardiology Office Note   Date:  09/19/2023   ID:  Sherwood GORMAN Ned, DOB [birth year], MRN [id number]  PCP:  [facility]  Cardiologist:   Vina Gull, MD   Pt presents for follow up and atrial fibrillation  History of Present Illness: Janice Curtis is a 83 y.o. female with hx of tricuspid regurgitation,   October 2023  Pt hospitalized for CVA   She was found to be in atrial fibrillation at the time  She was started on Elquis    The pt was last in cardiology clinc in Nov 2023   Since seen, the pt  says she has been doing OK She denies CP Breathing is ok  No palpitations  No dizziness No TIA like symptoms  THe pt does say she tires easily   BP is often high  Current Meds  Medication Sig   apixaban  (ELIQUIS ) 5 MG TABS tablet Take 1 tablet (5 mg total) by mouth 2 (two) times daily.   clopidogrel  (PLAVIX ) 75 MG tablet Take 75 mg by mouth daily.   diclofenac  Sodium (VOLTAREN ) 1 % GEL Apply 2 g topically in the morning and at bedtime.   diltiazem  (CARDIZEM  LA) 240 MG 24 hr tablet Take 1 tablet (240 mg total) by mouth daily.   ezetimibe  (ZETIA ) 10 MG tablet TAKE 1 TABLET BY MOUTH EVERY DAY   metoprolol  tartrate (LOPRESSOR ) 25 MG tablet Take 25 mg by mouth 2 (two) times daily.   ondansetron  (ZOFRAN ) 4 MG tablet Take 1 tablet (4 mg total) by mouth every 6 (six) hours as needed for nausea.   pantoprazole  (PROTONIX ) 40 MG tablet Take 1 tablet (40 mg total) by mouth at bedtime.   potassium chloride  SA (KLOR-CON  M) 20 MEQ tablet Take 2 tablets (40 mEq total) by mouth daily. (Patient taking differently: Take 40 mEq by mouth in the morning.)   rosuvastatin  (CRESTOR ) 40 MG tablet Place 1 tablet (40 mg total) into feeding tube daily.   VITAMIN D , CHOLECALCIFEROL , PO Take 1 tablet by mouth daily.     Allergies:   Patient has no known allergies.   Past Medical History:  Diagnosis Date   Anxiety    Colon cancer (HCC)    Hypercholesterolemia    Hypertension    Obesity     Stroke (HCC) 12/05/2009    Past Surgical History:  Procedure Laterality Date   COLONOSCOPY  12/27/08   simple adenoma/inflammatory polyp at anastomosis   COLONOSCOPY  04/23/2011   Procedure: COLONOSCOPY;  Surgeon: Margo CHRISTELLA Haddock, MD;  Location: [facility];  Service: Endoscopy;  Laterality: N/A;  10:00   IR CT HEAD LTD  12/09/2021   IR PERCUTANEOUS ART THROMBECTOMY/INFUSION INTRACRANIAL INC DIAG ANGIO  12/09/2021   RADIOLOGY WITH ANESTHESIA N/A 12/09/2021   Procedure: RADIOLOGY WITH ANESTHESIA;  Surgeon: Radiologist, Medication, MD;  Location: [facility] OR;  Service: Radiology;  Laterality: N/A;   RIGHT COLECTOMY  11/14/2007     Social History:  The patient  reports that she has never smoked. She has never used smokeless tobacco. She reports that she does not currently use alcohol. She reports that she does not use drugs.   Family History:  The patient's family history includes Heart attack in her mother; Stroke in her father.    ROS:  Please see the history of present illness. All other systems are reviewed and  Negative to the above problem except as noted.    PHYSICAL EXAM: VS:  BP (!) 170/88 (BP  Location: Left Arm, Patient Position: Sitting)   Pulse (!) 56   Ht 5' 3 (1.6 m)   Wt 141 lb 3.2 oz (64 kg)   SpO2 99%   BMI 25.01 kg/m   GEN:  Overweight 83 yo  in no acute distress  HEENT: normal  Neck: no JVD, carotid bruits Cardiac: Irreg irreg    II/VI systolic murmr LSB  Trivial LE  edema  Respiratory:  clear to auscultation  GI: soft, nontender,  No hepatomegaly  MS: no deformity Moving all extremities    EKG:  EKG is not ordered today   Carotid USN   Nov 2024 1. Proximal left M1 occlusion with limited collaterals within the                    anterior aspect of the left MCA distribution.                    2. Moderate stenosis of the right M1 segment.                    3. Segmental narrowing within distal right MCA branch vessels                   without a significant  proximal stenosis or occlusion.                    4. Moderate stenosis of the proximal right P2 segment.                    5. Occlusion of the proximal left vertebral artery with                    reconstitution at the C4 level. It is occluded again at the                   C2-3  level.                    6. High-grade stenoses of the proximal internal carotid                    arteries                    bilaterally at the carotid bifurcations.                    7. Moderate stenosis at the origin of the right vertebral                   artery.    Echo   12/09/21  eft ventricular ejection fraction, by estimation, is 60 to 65%. The left ventricle has normal function. The left ventricle has no regional wall motion abnormalities. There is moderate left ventricular hypertrophy of the basal-septal segment. Left ventricular diastolic parameters are indeterminate. 1. Right ventricular systolic function is normal. The right ventricular size is normal. There is normal pulmonary artery systolic pressure. The estimated right ventricular systolic pressure is 33.4 mmHg. 2. The mitral valve is normal in structure. No evidence of mitral valve regurgitation. No evidence of mitral stenosis. 3. 4. Tricuspid valve regurgitation is moderate. The aortic valve is tricuspid. Aortic valve regurgitation is mild. No aortic stenosis is present. 5. 6. Pulmonic valve regurgitation is moderate.he inferior vena cava is normal in size with greater than 50% respiratory variability, suggesting right atrial pressure of 3 mmHg.  Lipid Panel    Component Value Date/Time   CHOL 83 01/19/2023 1105   CHOL 154 10/23/2022 1538   TRIG 46 01/19/2023 1105   HDL 37 (L) 01/19/2023 1105   HDL 58 10/23/2022 1538   CHOLHDL 2.2 01/19/2023 1105   VLDL 9 01/19/2023 1105   LDLCALC 37 01/19/2023 1105   LDLCALC 77 10/23/2022 1538      Wt Readings from Last 3 Encounters:  09/19/23 141 lb 3.2 oz (64 kg)  01/19/23 131  lb 14.4 oz (59.8 kg)  10/23/22 131 lb 12.8 oz (59.8 kg)      ASSESSMENT AND PLAN:  1  Atrial fibrillation   Paroxysmal   Follow   Keep on ELiquis    2HTN  BP is high today   She has taken her meds  WOuld recomm stopping diltiazem   Start amlodipine  5 bid   Increase metoprolol  to 50 bid  Follow up in 1 month for BP check  3  HL   Last LDL was 37  HDL 37  Trig 46 (Nov 2024)   She is on Crestor  40 mg  Will check lipomed      4  Hx CVA  Was in afib a time On Eliquis     5  CV dz   Carotid USN in NOv 2024shows moderate dz   WIll need to follow   6  TR   Remains moderate   F/U in Summer 2024  Current medicines are reviewed at length with the patient today.  The patient does not have concerns regarding medicines.  Signed, Vina Gull, MD  09/19/2023 1:31 PM    [facility] [address] [place], [place], [place]  [address] Phone: [phone]; Fax: [phone]

## 2023-09-19 ENCOUNTER — Ambulatory Visit: Payer: Self-pay | Attending: Internal Medicine | Admitting: Internal Medicine

## 2023-09-19 ENCOUNTER — Encounter: Payer: Self-pay | Admitting: Internal Medicine

## 2023-09-19 VITALS — BP 170/88 | HR 56 | Ht 63.0 in | Wt 141.2 lb

## 2023-09-19 DIAGNOSIS — I10 Essential (primary) hypertension: Secondary | ICD-10-CM | POA: Diagnosis not present

## 2023-09-19 MED ORDER — AMLODIPINE BESYLATE 5 MG PO TABS: 5.0000 mg | ORAL_TABLET | Freq: Two times a day (BID) | ORAL | 6 refills | Status: DC

## 2023-09-19 MED ORDER — METOPROLOL TARTRATE 50 MG PO TABS: 50.0000 mg | ORAL_TABLET | Freq: Two times a day (BID) | ORAL | 6 refills | Status: DC

## 2023-09-19 NOTE — Patient Instructions (Addendum)
 Medication Instructions:   Stop Diltiazem  (Cardizem  LA) Begin Amlodipine  5mg  twice a day   Increase Lopressor  to 50mg  twice a day   Continue all other medications.     Labwork:  none  Testing/Procedures:  none  Follow-Up:  Nurse visit in 4 weeks for nurse BP check   Any Other Special Instructions Will Be Listed Below (If Applicable).   If you need a refill on your cardiac medications before your next appointment, please call your pharmacy.

## 2023-10-17 ENCOUNTER — Ambulatory Visit

## 2023-10-18 ENCOUNTER — Ambulatory Visit: Attending: Cardiology | Admitting: *Deleted

## 2023-10-18 VITALS — BP 138/80 | HR 54

## 2023-10-18 DIAGNOSIS — Z0130 Encounter for examination of blood pressure without abnormal findings: Secondary | ICD-10-CM

## 2023-10-18 NOTE — Progress Notes (Signed)
 Pt in office for blood pressure check. When pt checked her BP at home it was 140/ 80.

## 2023-10-23 ENCOUNTER — Ambulatory Visit: Payer: Medicare HMO | Admitting: Neurology

## 2023-11-05 ENCOUNTER — Ambulatory Visit: Admitting: Neurology

## 2023-11-05 ENCOUNTER — Encounter: Payer: Self-pay | Admitting: Neurology

## 2023-11-05 VITALS — BP 141/86 | HR 89 | Ht 63.0 in | Wt 138.0 lb

## 2023-11-05 DIAGNOSIS — Z8673 Personal history of transient ischemic attack (TIA), and cerebral infarction without residual deficits: Secondary | ICD-10-CM

## 2023-11-05 DIAGNOSIS — I63512 Cerebral infarction due to unspecified occlusion or stenosis of left middle cerebral artery: Secondary | ICD-10-CM

## 2023-11-05 DIAGNOSIS — I6521 Occlusion and stenosis of right carotid artery: Secondary | ICD-10-CM

## 2023-11-05 DIAGNOSIS — G3184 Mild cognitive impairment, so stated: Secondary | ICD-10-CM

## 2023-11-05 NOTE — Patient Instructions (Signed)
 I had a long d/w patient and her son about her recent embolic stroke, mechanical thrombectomy and new onset atrial fibrillation, asymptomatic right carotid moderate stenosis, mild cognitive impairment, risk for recurrent stroke/TIAs, personally independently reviewed imaging studies and stroke evaluation results and answered questions.Continue Eliquis  (apixaban ) daily  for secondary stroke prevention and maintain strict control of hypertension with blood pressure goal below 130/90, diabetes with hemoglobin A1c goal below 6.5% and lipids with LDL cholesterol goal below 70 mg/dL. I also advised the patient to eat a healthy diet with plenty of whole grains, cereals, fruits and vegetables, exercise regularly and maintain ideal body weight.  She was encouraged to use a walker at all times and was discussed fall safety precautions.  Check screening carotid ultrasound we also discussed memory compensation strategies and I advised her to increase participation in cognitively challenging activities like solving crossword puzzles, playing bridge and sudoku.  She also advised to use a cane while ambulating long distances at all times and we discussed fall safety precautions.  Followup in the future with me in 1 year or call earlier if necessary.  Memory Compensation Strategies  Use WARM strategy.  W= write it down  A= associate it  R= repeat it  M= make a mental note  2.   You can keep a Glass blower/designer.  Use a 3-ring notebook with sections for the following: calendar, important names and phone numbers,  medications, doctors' names/phone numbers, lists/reminders, and a section to journal what you did  each day.   3.    Use a calendar to write appointments down.  4.    Write yourself a schedule for the day.  This can be placed on the calendar or in a separate section of the Memory Notebook.  Keeping a  regular schedule can help memory.  5.    Use medication organizer with sections for each day or  morning/evening pills.  You may need help loading it  6.    Keep a basket, or pegboard by the door.  Place items that you need to take out with you in the basket or on the pegboard.  You may also want to  include a message board for reminders.  7.    Use sticky notes.  Place sticky notes with reminders in a place where the task is performed.  For example:  turn off the  stove placed by the stove, lock the door placed on the door at eye level,  take your medications on  the bathroom mirror or by the place where you normally take your medications.  8.    Use alarms/timers.  Use while cooking to remind yourself to check on food or as a reminder to take your medicine, or as a  reminder to make a call, or as a reminder to perform another task, etc.

## 2023-11-05 NOTE — Progress Notes (Signed)
 [facility] [address] [place]. [place] [address] [phone]       OFFICE FOLLOW-UP NOTE  Janice Curtis Date of Birth:  1940/06/20 Medical Record Number:  [id number]   HPI:  Update 10/23/2022 ; she returns for follow-up after last visit 8 months ago.  She is accompanied by her son.  Patient continues to have mild speech and word finding difficulties.  At times she struggles to find words but most of the time can talk fluently.  She has no trouble with naming repetition or comprehension.  She continues to live at home with her grandson and daughter.  She is independent in all activities of daily living.  She does use a cane but occasionally can walk indoors without it.  She is slightly unsteady when she makes a sudden move but has only occasional falls but no injuries.  She is not driving but otherwise doing everything for herself.  She admits that the brain may not be as sharp as it used to be.  She has had no recurrent stroke or TIA symptoms.  She is tolerating Eliquis  well with only minor bruising and no bleeding.  She is tolerating Crestor  well without muscle aches and pains but she has not had any recent lipid profile checked.  She has no new complaints. Visit 02/13/2022 Janice Curtis is a 83 year old pleasant African-American lady seen today for initial office follow-up visit following hospital admission for stroke in October 2023.  She is accompanied by her son.  History is obtained from them and review of electronic medical records.  I personally reviewed pertinent available imaging films in PACS.  She has past medical history of colon cancer, hyperlipidemia, hypertension, obesity remote stroke who presented to [facility] on 12/09/2021 via EMS as a transfer from [facility] emergency for sudden onset of aphasia with right hemiparesis as noted by her grandson following a fall at home.  Grandson ordered out tongue on the floor and emergency room found  her to be nonverbal with right leg weakness.  EMS was called..  And she was brought in as a code stroke was called en route.  Patient was taken to [facility] and evaluated by teleneurology.  Administered IV TNK and emergent CT angiogram showed left MCA artery occlusion.  NIH Stroke scale was 33.  She was transferred emergently to [facility] and after discussion of risk benefits getting informed consent from the family she underwent successful mechanical thrombectomy with TICI 3 flow established in the left MCA.  She was admitted to the intensive care unit.  Blood pressure tightly controlled.  She was extubated.  She did well.  EKG showed atrial fibrillation which was new onset.  2D echo showed ejection fraction 60 to 65%.  LDL cholesterol 131 mg percent.  Hemoglobin A1c was 5.7.  Follow-up CT scan showed trace subarachnoid hemorrhage moderate left posterior insula and follow-up CT scan showed resolution.  Patient was started on Eliquis  at discharge and went to rehabilitation in a skilled nursing facility which she has done well.  He has been discharged home now and is presently getting home physical and Occupational Therapy.  She is able to ambulate with a walker.  She has had no falls or injuries.  Her speech is improved significantly though she has occasional word finding difficulties and word hesitancy.  She also feels tired and has less energy.  She is living at home with her grandson  and daughter.  Patient is tolerating Eliquis  well without bruising or bleeding but is complaining about high co-pay.  Her blood pressure is under good control and today it is 131/83.  She is tolerating Crestor  well without muscle aches and pains. Update 11/05/2023 : She returns for follow-up after last visit a year ago.  She is accompanied by her son.  Patient states she is doing well.  She continues to have very occasional loose word finding difficulties and gets stuck on certain words but most of the time she can  carry out a fairly good normal conversation with most people.  She continues to have mild heaviness and weakness of the right leg and uses a cane to ambulate.  She has had no falls or injuries.  She remains on Eliquis  which she is tolerating well with minor bruising and no bleeding episodes.  She is living at home with her grandson.  She is mostly independent in most activities of daily living except needs some help with her finances.  She did have a carotid ultrasound done on 01/07/2023 which showed moderate 60 to 79% right ICA and mild 40-59% left ICA stenosis.  Transcranial Doppler study was suboptimal due to poor windows.  She states she is tolerating her Crestor  well without side effects.  Last lipid profile on 01/09/2023 showed LDL-cholesterol to be optimal at 57 mg percent and hemoglobin A1c 5.5.  She states her blood pressure under good control.  She has ongoing mild short-term memory and cognitive difficulties but these are nonprogressive and she is quite functional.  She does do solitaire daily but does not participate in a lot of other activities   which are cognitively challenging. ROS:   14 system review of systems is positive for speech difficulties, walking difficulty, imbalance, bruising and all other systems negative  PMH:  Past Medical History:  Diagnosis Date   Anxiety    Colon cancer (HCC)    Hypercholesterolemia    Hypertension    Obesity    Stroke (HCC) 12/05/2009    Social History:  Social History   Socioeconomic History   Marital status: Divorced    Spouse name: Not on file   Number of children: Not on file   Years of education: Not on file   Highest education level: Not on file  Occupational History   Not on file  Tobacco Use   Smoking status: Never   Smokeless tobacco: Never  Vaping Use   Vaping status: Never Used  Substance and Sexual Activity   Alcohol use: Not Currently   Drug use: No   Sexual activity: Yes  Other Topics Concern   Not on file  Social  History Narrative   RAISES HER GRANDSON SINCE HE WAS 2 YO. HE IS 5 YO NOW.   Social Drivers of Corporate investment banker Strain: Low Risk  (01/11/2022)   Overall Financial Resource Strain (CARDIA)    Difficulty of Paying Living Expenses: Not hard at all  Food Insecurity: No Food Insecurity (01/18/2023)   Hunger Vital Sign    Worried About Running Out of Food in the Last Year: Never true    Ran Out of Food in the Last Year: Never true  Transportation Needs: No Transportation Needs (01/18/2023)   PRAPARE - Administrator, Civil Service (Medical): No    Lack of Transportation (Non-Medical): No  Physical Activity: Not on file  Stress: Not on file  Social Connections: Not on file  Intimate Partner Violence:  Not At Risk (01/18/2023)   Humiliation, Afraid, Rape, and Kick questionnaire    Fear of Current or Ex-Partner: No    Emotionally Abused: No    Physically Abused: No    Sexually Abused: No    Medications:   Current Outpatient Medications on File Prior to Visit  Medication Sig Dispense Refill   amLODipine  (NORVASC ) 5 MG tablet Take 1 tablet (5 mg total) by mouth 2 (two) times daily. 60 tablet 6   apixaban  (ELIQUIS ) 5 MG TABS tablet Take 1 tablet (5 mg total) by mouth 2 (two) times daily. 60 tablet 0   clopidogrel  (PLAVIX ) 75 MG tablet Take 75 mg by mouth daily.     diclofenac  Sodium (VOLTAREN ) 1 % GEL Apply 2 g topically in the morning and at bedtime. 150 g 0   ezetimibe  (ZETIA ) 10 MG tablet TAKE 1 TABLET BY MOUTH EVERY DAY 90 tablet 1   metoprolol  tartrate (LOPRESSOR ) 50 MG tablet Take 1 tablet (50 mg total) by mouth 2 (two) times daily. 60 tablet 6   ondansetron  (ZOFRAN ) 4 MG tablet Take 1 tablet (4 mg total) by mouth every 6 (six) hours as needed for nausea. 20 tablet 0   pantoprazole  (PROTONIX ) 40 MG tablet Take 1 tablet (40 mg total) by mouth at bedtime. 30 tablet 0   potassium chloride  SA (KLOR-CON  M) 20 MEQ tablet Take 2 tablets (40 mEq total) by mouth daily.  (Patient taking differently: Take 40 mEq by mouth in the morning.) 60 tablet 0   rosuvastatin  (CRESTOR ) 40 MG tablet Place 1 tablet (40 mg total) into feeding tube daily. 30 tablet 0   VITAMIN D , CHOLECALCIFEROL , PO Take 1 tablet by mouth daily.     No current facility-administered medications on file prior to visit.    Allergies:  No Known Allergies  Physical Exam General: well developed, well nourished pleasant elderly African-American lady, seated, in no evident distress Head: head normocephalic and atraumatic.  Neck: supple with no carotid or supraclavicular bruits Cardiovascular: regular rate and rhythm, no murmurs Musculoskeletal: no deformity Skin:  no rash/petichiae Vascular:  Normal pulses all extremities Vitals:   11/05/23 1354  BP: (!) 141/86  Pulse: 89   Neurologic Exam Mental Status: Awake and fully alert. Oriented to place and time. Recent and remote memory intact. Attention span, concentration and fund of knowledge appropriate. Mood and affect appropriate.  Slightly hesitant speech with occasional word finding difficulties.  MMSE score 29/30 which is actually improved from last visit.  Clock drawing 4/4.  Animal naming 10. Cranial Nerves: Fundoscopic exam not done pupils equal, briskly reactive to light. Extraocular movements full without nystagmus. Visual fields full to confrontation. Hearing intact. Facial sensation intact. Face, tongue, palate moves normally and symmetrically.  Motor: Normal bulk and tone. Normal strength in all tested extremity muscles.  Mild diminished fine finger movements on the right.  Albeit left to right upper extremity.  Trace weakness of right hip flexors and ankle dorsiflexors. Sensory.: intact to touch ,pinprick .position and vibratory sensation.  Coordination: Rapid alternating movements normal in all extremities. Finger-to-nose and heel-to-shin performed accurately bilaterally. Gait and Station: Arises from chair without difficulty. Stance  is normal.  Uses a cane slight dragging of the right leg.  Cautious gait.  Slightly unsteady when she makes a turn. Reflexes: 1+ and symmetric. Toes downgoing.   NIHSS  1 Modified Rankin  2    11/05/2023    1:56 PM 10/23/2022    3:36 PM  MMSE - Mini Mental  State Exam  Orientation to time 5 5  Orientation to Place 5 5  Registration 3 3  Attention/ Calculation 5 2  Recall 3 3  Language- name 2 objects 2 2  Language- repeat 1 0  Language- follow 3 step command 3 3  Language- read & follow direction 1 1  Write a sentence 1 1  Copy design 0 1  Total score 29 26     ASSESSMENT: 83 year old African-American lady with left middle cerebral artery infarct in October 2023 secondary to left M1 occlusion s/p mechanical thrombectomy with excellent revascularization from new onset atrial fibrillation.  She also has mild cognitive impairment which appears stable as well as asymptomatic moderate right carotid stenosis.     PLAN:I had a long d/w patient and her son about her recent embolic stroke, mechanical thrombectomy and new onset atrial fibrillation, asymptomatic right carotid moderate stenosis, mild cognitive impairment, risk for recurrent stroke/TIAs, personally independently reviewed imaging studies and stroke evaluation results and answered questions.Continue Eliquis  (apixaban ) daily  for secondary stroke prevention and maintain strict control of hypertension with blood pressure goal below 130/90, diabetes with hemoglobin A1c goal below 6.5% and lipids with LDL cholesterol goal below 70 mg/dL. I also advised the patient to eat a healthy diet with plenty of whole grains, cereals, fruits and vegetables, exercise regularly and maintain ideal body weight.  She was encouraged to use a walker at all times and was discussed fall safety precautions.  Check screening carotid ultrasound we also discussed memory compensation strategies and I advised her to increase participation in cognitively challenging  activities like solving crossword puzzles, playing bridge and sudoku.  She also advised to use a cane while ambulating long distances at all times and we discussed fall safety precautions.  Followup in the future with me in 1 year or call earlier if necessary.   I personally spent a total of 40 minutes in the care of the patient today including getting/reviewing separately obtained history, performing a medically appropriate exam/evaluation, counseling and educating, placing orders, referring and communicating with other health care professionals, documenting clinical information in the EHR, independently interpreting results, and coordinating care.        Eather Popp, MD Note: This document was prepared with digital dictation and possible smart phrase technology. Any transcriptional errors that result from this process are unintentional

## 2023-12-09 ENCOUNTER — Ambulatory Visit (HOSPITAL_COMMUNITY)
Admission: RE | Admit: 2023-12-09 | Discharge: 2023-12-09 | Disposition: A | Discharge status: Home / Self Care | Source: Ambulatory Visit | Attending: Neurology | Admitting: Neurology

## 2023-12-09 DIAGNOSIS — I63512 Cerebral infarction due to unspecified occlusion or stenosis of left middle cerebral artery: Secondary | ICD-10-CM | POA: Diagnosis not present

## 2023-12-15 ENCOUNTER — Ambulatory Visit: Payer: Self-pay | Admitting: Neurology

## 2023-12-15 ENCOUNTER — Other Ambulatory Visit: Payer: Self-pay | Admitting: Neurology

## 2023-12-15 DIAGNOSIS — Z8673 Personal history of transient ischemic attack (TIA), and cerebral infarction without residual deficits: Secondary | ICD-10-CM

## 2023-12-15 DIAGNOSIS — I6521 Occlusion and stenosis of right carotid artery: Secondary | ICD-10-CM

## 2023-12-15 NOTE — Progress Notes (Signed)
 Your carotid ultrasound shows significant narrowing of the right carotid artery in the neck.  This puts you at significant risk for stroke.  I would recommend referral to vascular surgery to consider revascularization treatment options to lower your stroke risk if you are interested.  There is also mild narrowing of the left carotid artery but this can be managed medically.

## 2023-12-15 NOTE — Progress Notes (Signed)
 SABRA

## 2023-12-17 NOTE — Telephone Encounter (Signed)
-----   Message from Orion KATHEE Croft sent at 12/17/2023 10:22 AM EDT -----

## 2023-12-17 NOTE — Telephone Encounter (Signed)
 Patient informed with ultrasound results, referral placed. Patient aware.

## 2023-12-17 NOTE — Telephone Encounter (Signed)
 Pt called back. Please call back when available. Pt states if she does not pick up on the first call to either call right back or LVM because she can not get to the phone quick.

## 2024-01-06 NOTE — Progress Notes (Unsigned)
 Patient name: Janice Curtis MRN: [id number] DOB: [birth year] Sex: female  REASON FOR CONSULT: Carotid stenosis on duplex  HPI: Janice Curtis is a 83 y.o. female, with history of colon cancer, hypertension, hyperlipidemia, prior CVA that presents for evaluation of carotid stenosis on duplex.  Patient is followed by Dr. Teddie with neurology.  Patient had a carotid ultrasound on 12/09/2023 showing a high-grade 89% right ICA stenosis with a 40 to 59% left ICA stenosis.  Of note her stroke in 2023 was a left MCA infarct with M1 occlusion from A-fib requiring percutaneous thrombectomy.  Past Medical History:  Diagnosis Date   Anxiety    Colon cancer (HCC)    Hypercholesterolemia    Hypertension    Obesity    Stroke (HCC) 12/05/2009    Past Surgical History:  Procedure Laterality Date   COLONOSCOPY  12/27/08   simple adenoma/inflammatory polyp at anastomosis   COLONOSCOPY  04/23/2011   Procedure: COLONOSCOPY;  Surgeon: Margo CHRISTELLA Haddock, MD;  Location: [facility];  Service: Endoscopy;  Laterality: N/A;  10:00   IR CT HEAD LTD  12/09/2021   IR PERCUTANEOUS ART THROMBECTOMY/INFUSION INTRACRANIAL INC DIAG ANGIO  12/09/2021   RADIOLOGY WITH ANESTHESIA N/A 12/09/2021   Procedure: RADIOLOGY WITH ANESTHESIA;  Surgeon: Radiologist, Medication, MD;  Location: [facility] OR;  Service: Radiology;  Laterality: N/A;   RIGHT COLECTOMY  11/14/2007    Family History  Problem Relation Age of Onset   Heart attack Mother    Stroke Father     SOCIAL HISTORY: Social History   Socioeconomic History   Marital status: Divorced    Spouse name: Not on file   Number of children: Not on file   Years of education: Not on file   Highest education level: Not on file  Occupational History   Not on file  Tobacco Use   Smoking status: Never   Smokeless tobacco: Never  Vaping Use   Vaping status: Never Used  Substance and Sexual Activity   Alcohol use: Not Currently   Drug use: No   Sexual activity: Yes   Other Topics Concern   Not on file  Social History Narrative   RAISES HER GRANDSON SINCE HE WAS 2 YO. HE IS 5 YO NOW.   Social Drivers of Corporate Investment Banker Strain: Low Risk  (01/11/2022)   Overall Financial Resource Strain (CARDIA)    Difficulty of Paying Living Expenses: Not hard at all  Food Insecurity: No Food Insecurity (01/18/2023)   Hunger Vital Sign    Worried About Running Out of Food in the Last Year: Never true    Ran Out of Food in the Last Year: Never true  Transportation Needs: No Transportation Needs (01/18/2023)   PRAPARE - Administrator, Civil Service (Medical): No    Lack of Transportation (Non-Medical): No  Physical Activity: Not on file  Stress: Not on file  Social Connections: Not on file  Intimate Partner Violence: Not At Risk (01/18/2023)   Humiliation, Afraid, Rape, and Kick questionnaire    Fear of Current or Ex-Partner: No    Emotionally Abused: No    Physically Abused: No    Sexually Abused: No    No Known Allergies  Current Outpatient Medications  Medication Sig Dispense Refill   amLODipine  (NORVASC ) 5 MG tablet Take 1 tablet (5 mg total) by mouth 2 (two) times daily. 60 tablet 6   apixaban  (ELIQUIS ) 5 MG TABS tablet Take 1 tablet (5  mg total) by mouth 2 (two) times daily. 60 tablet 0   clopidogrel  (PLAVIX ) 75 MG tablet Take 75 mg by mouth daily.     diclofenac  Sodium (VOLTAREN ) 1 % GEL Apply 2 g topically in the morning and at bedtime. 150 g 0   ezetimibe  (ZETIA ) 10 MG tablet TAKE 1 TABLET BY MOUTH EVERY DAY 90 tablet 1   metoprolol  tartrate (LOPRESSOR ) 50 MG tablet Take 1 tablet (50 mg total) by mouth 2 (two) times daily. 60 tablet 6   ondansetron  (ZOFRAN ) 4 MG tablet Take 1 tablet (4 mg total) by mouth every 6 (six) hours as needed for nausea. 20 tablet 0   pantoprazole  (PROTONIX ) 40 MG tablet Take 1 tablet (40 mg total) by mouth at bedtime. 30 tablet 0   potassium chloride  SA (KLOR-CON  M) 20 MEQ tablet Take 2 tablets (40  mEq total) by mouth daily. (Patient taking differently: Take 40 mEq by mouth in the morning.) 60 tablet 0   rosuvastatin  (CRESTOR ) 40 MG tablet Place 1 tablet (40 mg total) into feeding tube daily. 30 tablet 0   VITAMIN D , CHOLECALCIFEROL , PO Take 1 tablet by mouth daily.     No current facility-administered medications for this visit.    REVIEW OF SYSTEMS:  [X]  denotes positive finding, [ ]  denotes negative finding Cardiac  Comments:  Chest pain or chest pressure: ***   Shortness of breath upon exertion:    Short of breath when lying flat:    Irregular heart rhythm:        Vascular    Pain in calf, thigh, or hip brought on by ambulation:    Pain in feet at night that wakes you up from your sleep:     Blood clot in your veins:    Leg swelling:         Pulmonary    Oxygen at home:    Productive cough:     Wheezing:         Neurologic    Sudden weakness in arms or legs:     Sudden numbness in arms or legs:     Sudden onset of difficulty speaking or slurred speech:    Temporary loss of vision in one eye:     Problems with dizziness:         Gastrointestinal    Blood in stool:     Vomited blood:         Genitourinary    Burning when urinating:     Blood in urine:        Psychiatric    Major depression:         Hematologic    Bleeding problems:    Problems with blood clotting too easily:        Skin    Rashes or ulcers:        Constitutional    Fever or chills:      PHYSICAL EXAM: There were no vitals filed for this visit.  GENERAL: The patient is a well-nourished female, in no acute distress. The vital signs are documented above. CARDIAC: There is a regular rate and rhythm.  VASCULAR: *** PULMONARY: There is good air exchange bilaterally without wheezing or rales. ABDOMEN: Soft and non-tender with normal pitched bowel sounds.  MUSCULOSKELETAL: There are no major deformities or cyanosis. NEUROLOGIC: No focal weakness or paresthesias are detected. SKIN:  There are no ulcers or rashes noted. PSYCHIATRIC: The patient has a normal affect.  DATA:   Carotid ultrasound  12/09/2023 with a high-grade 80-99% right ICA stenosis and 40 to 59% left ICA stenosis  Assessment/Plan:  83 y.o. female, with history of colon cancer, hypertension, hyperlipidemia, prior CVA that presents for evaluation of carotid stenosis on duplex.  Patient is followed by Dr. Rosemarie with neurology.  Patient had a carotid ultrasound on 12/09/2023 showing a high-grade 80-99% right ICA stenosis with a 40 to 59% left ICA stenosis.  Discussed for asymptomatic carotid disease recommend surgical intervention when stenosis is greater than 80%.  Discussed this is for stroke risk reduction.  Discussed that we would recommend right carotid endarterectomy versus carotid stenting with TCAR for stroke risk reduction.  I have recommended updated CTA neck as her last imaging was approximately 2 years ago to evaluate options for revascularization.   Janice DOROTHA Gaskins, MD [facility] Office: [phone]

## 2024-01-07 ENCOUNTER — Ambulatory Visit: Admitting: Vascular Surgery

## 2024-01-07 ENCOUNTER — Encounter: Payer: Self-pay | Admitting: Vascular Surgery

## 2024-01-07 VITALS — BP 143/81 | HR 79 | Temp 98.0°F | Resp 22 | Ht 63.0 in | Wt 146.2 lb

## 2024-01-07 DIAGNOSIS — I6523 Occlusion and stenosis of bilateral carotid arteries: Secondary | ICD-10-CM | POA: Diagnosis not present

## 2024-01-07 DIAGNOSIS — I6529 Occlusion and stenosis of unspecified carotid artery: Secondary | ICD-10-CM | POA: Insufficient documentation

## 2024-01-08 ENCOUNTER — Telehealth: Payer: Self-pay

## 2024-01-08 NOTE — Telephone Encounter (Signed)
 Per Dr Gaylene note - patient wants to wait until January for surgery.  Will call for scheduling.

## 2024-01-10 ENCOUNTER — Other Ambulatory Visit: Payer: Self-pay

## 2024-01-10 DIAGNOSIS — I6523 Occlusion and stenosis of bilateral carotid arteries: Secondary | ICD-10-CM

## 2024-03-02 ENCOUNTER — Encounter (HOSPITAL_COMMUNITY): Payer: Self-pay

## 2024-03-02 NOTE — Progress Notes (Signed)
 Surgical Instructions   Your procedure is scheduled on Wednesday March 11, 2024. Report to [facility] Main Entrance A at 6:30 A.M., then check in with the Admitting office. Any questions or running late day of surgery: call [phone]  Questions prior to your surgery date: call [phone], Monday-Friday, 8am-4pm. If you experience any cold or flu symptoms such as cough, fever, chills, shortness of breath, etc. between now and your scheduled surgery, please notify us  at the above number.     Remember:  Do not eat or drink after midnight the night before your surgery  Take these medicines the morning of surgery with A SIP OF WATER   amLODipine  (NORVASC )  ezetimibe  (ZETIA )  metoprolol  tartrate (LOPRESSOR )  rosuvastatin  (CRESTOR )   May take these medicines IF NEEDED: ondansetron  (ZOFRAN )   PER YOUR SURGEON'S INSTRUCTIONS, PLEASE HOLD YOUR apixaban  (ELIQUIS )  3 DAYS PRIOR TO SURGERY WITH THE LAST DOSE BEING 03/07/2024.   One week prior to surgery, STOP taking any Aspirin  (unless otherwise instructed by your surgeon) Aleve, Naproxen, Ibuprofen , Motrin , Advil , Goody's, BC's, all herbal medications, fish oil, and non-prescription vitamins. This includes your diclofenac  Sodium (VOLTAREN ) 1 % GEL.                       Do NOT Smoke (Tobacco/Vaping) for 24 hours prior to your procedure.  If you use a CPAP at night, you may bring your mask/headgear for your overnight stay.   You will be asked to remove any contacts, glasses, piercing's, hearing aid's, dentures/partials prior to surgery. Please bring cases for these items if needed.    Patients discharged the day of surgery will not be allowed to drive home, and someone needs to stay with them for 24 hours.  SURGICAL WAITING ROOM VISITATION Patients may have no more than 2 support people in the waiting area - these visitors may rotate.   Pre-op nurse will coordinate an appropriate time for 1 ADULT support person, who may not rotate, to  accompany patient in pre-op.  Children under the age of 72 must have an adult with them who is not the patient and must remain in the main waiting area with an adult.  If the patient needs to stay at the hospital during part of their recovery, the visitor guidelines for inpatient rooms apply.  Please refer to the [facility] website for the visitor guidelines for any additional information.   If you received a COVID test during your pre-op visit  it is requested that you wear a mask when out in public, stay away from anyone that may not be feeling well and notify your surgeon if you develop symptoms. If you have been in contact with anyone that has tested positive in the last 10 days please notify you surgeon.      Pre-operative CHG Bathing Instructions   You can play a key role in reducing the risk of infection after surgery. Your skin needs to be as free of germs as possible. You can reduce the number of germs on your skin by washing with CHG (chlorhexidine  gluconate) soap before surgery. CHG is an antiseptic soap that kills germs and continues to kill germs even after washing.   DO NOT use if you have an allergy to chlorhexidine /CHG or antibacterial soaps. If your skin becomes reddened or irritated, stop using the CHG and notify one of our RNs at [phone].              TAKE A SHOWER  THE NIGHT BEFORE SURGERY   Please keep in mind the following:  DO NOT shave, including legs and underarms, 48 hours prior to surgery.   Place clean sheets on your bed the night before surgery Use a clean washcloth (not used since being washed) for shower. DO NOT sleep with pet's night before surgery.  CHG Shower Instructions:  Wash your face and private area with normal soap. If you choose to wash your hair, wash first with your normal shampoo.  After you use shampoo/soap, rinse your hair and body thoroughly to remove shampoo/soap residue.  Turn the water  OFF and apply half the bottle of CHG soap to a  CLEAN washcloth.  Apply CHG soap ONLY FROM YOUR NECK DOWN TO YOUR TOES (washing for 3-5 minutes)  DO NOT use CHG soap on face, private areas, open wounds, or sores.  Pay special attention to the area where your surgery is being performed.  If you are having back surgery, having someone wash your back for you may be helpful. Wait 2 minutes after CHG soap is applied, then you may rinse off the CHG soap.  Pat dry with a clean towel  Put on clean pajamas    Additional instructions for the day of surgery: If you choose, you may shower the morning of surgery with an antibacterial soap.  DO NOT APPLY any lotions, deodorants or perfumes.   Do not wear jewelry or makeup Do not wear nail polish, gel polish, artificial nails, or any other type of covering on natural nails (fingers and toes) Do not bring valuables to the hospital. [facility] is not responsible for valuables/personal belongings. Put on clean/comfortable clothes.  Please brush your teeth.  Ask your nurse before applying any prescription medications to the skin.

## 2024-03-03 ENCOUNTER — Other Ambulatory Visit: Payer: Self-pay

## 2024-03-03 ENCOUNTER — Encounter (HOSPITAL_COMMUNITY)
Admission: RE | Admit: 2024-03-03 | Discharge: 2024-03-03 | Disposition: A | Discharge status: Home / Self Care | Source: Ambulatory Visit | Attending: Vascular Surgery | Admitting: Vascular Surgery

## 2024-03-03 ENCOUNTER — Encounter (HOSPITAL_COMMUNITY): Payer: Self-pay

## 2024-03-03 VITALS — BP 132/77 | HR 60 | Temp 98.0°F | Resp 17 | Ht 63.0 in | Wt 146.3 lb

## 2024-03-03 DIAGNOSIS — I361 Nonrheumatic tricuspid (valve) insufficiency: Secondary | ICD-10-CM | POA: Diagnosis not present

## 2024-03-03 DIAGNOSIS — E785 Hyperlipidemia, unspecified: Secondary | ICD-10-CM | POA: Insufficient documentation

## 2024-03-03 DIAGNOSIS — I480 Paroxysmal atrial fibrillation: Secondary | ICD-10-CM | POA: Diagnosis not present

## 2024-03-03 DIAGNOSIS — Z01818 Encounter for other preprocedural examination: Secondary | ICD-10-CM | POA: Diagnosis not present

## 2024-03-03 DIAGNOSIS — I6523 Occlusion and stenosis of bilateral carotid arteries: Secondary | ICD-10-CM | POA: Insufficient documentation

## 2024-03-03 DIAGNOSIS — Z7901 Long term (current) use of anticoagulants: Secondary | ICD-10-CM | POA: Diagnosis not present

## 2024-03-03 DIAGNOSIS — Z8673 Personal history of transient ischemic attack (TIA), and cerebral infarction without residual deficits: Secondary | ICD-10-CM | POA: Diagnosis not present

## 2024-03-03 DIAGNOSIS — I10 Essential (primary) hypertension: Secondary | ICD-10-CM | POA: Diagnosis not present

## 2024-03-03 DIAGNOSIS — Z01812 Encounter for preprocedural laboratory examination: Secondary | ICD-10-CM | POA: Diagnosis present

## 2024-03-03 DIAGNOSIS — Z01810 Encounter for preprocedural cardiovascular examination: Secondary | ICD-10-CM | POA: Diagnosis present

## 2024-03-03 HISTORY — DX: Cardiac arrhythmia, unspecified: I49.9

## 2024-03-03 LAB — URINALYSIS, ROUTINE W REFLEX MICROSCOPIC
Bilirubin Urine: NEGATIVE
Glucose, UA: NEGATIVE mg/dL
Hgb urine dipstick: NEGATIVE
Ketones, ur: NEGATIVE mg/dL
Nitrite: NEGATIVE
Protein, ur: 30 mg/dL — AB
Specific Gravity, Urine: 1.02 (ref 1.005–1.030)
pH: 5 (ref 5.0–8.0)

## 2024-03-03 LAB — CBC
HCT: 42.6 % (ref 36.0–46.0)
Hemoglobin: 13.5 g/dL (ref 12.0–15.0)
MCH: 27.1 pg (ref 26.0–34.0)
MCHC: 31.7 g/dL (ref 30.0–36.0)
MCV: 85.4 fL (ref 80.0–100.0)
Platelets: 164 K/uL (ref 150–400)
RBC: 4.99 MIL/uL (ref 3.87–5.11)
RDW: 14.4 % (ref 11.5–15.5)
WBC: 5.4 K/uL (ref 4.0–10.5)
nRBC: 0 % (ref 0.0–0.2)

## 2024-03-03 LAB — COMPREHENSIVE METABOLIC PANEL WITH GFR
ALT: 16 U/L (ref 0–44)
AST: 27 U/L (ref 15–41)
Albumin: 4.2 g/dL (ref 3.5–5.0)
Alkaline Phosphatase: 77 U/L (ref 38–126)
Anion gap: 8 (ref 5–15)
BUN: 15 mg/dL (ref 8–23)
CO2: 26 mmol/L (ref 22–32)
Calcium: 9.7 mg/dL (ref 8.9–10.3)
Chloride: 101 mmol/L (ref 98–111)
Creatinine, Ser: 1.09 mg/dL — ABNORMAL HIGH (ref 0.44–1.00)
GFR, Estimated: 50 mL/min — ABNORMAL LOW
Glucose, Bld: 96 mg/dL (ref 70–99)
Potassium: 4.5 mmol/L (ref 3.5–5.1)
Sodium: 134 mmol/L — ABNORMAL LOW (ref 135–145)
Total Bilirubin: 0.7 mg/dL (ref 0.0–1.2)
Total Protein: 7.5 g/dL (ref 6.5–8.1)

## 2024-03-03 LAB — SURGICAL PCR SCREEN
MRSA, PCR: POSITIVE — AB
Staphylococcus aureus: POSITIVE — AB

## 2024-03-03 LAB — TYPE AND SCREEN
ABO/RH(D): O POS
Antibody Screen: NEGATIVE

## 2024-03-03 LAB — PROTIME-INR
INR: 1.1 (ref 0.8–1.2)
Prothrombin Time: 15 s (ref 11.4–15.2)

## 2024-03-03 LAB — APTT: aPTT: 29 s (ref 24–36)

## 2024-03-03 MED ORDER — SULFAMETHOXAZOLE-TRIMETHOPRIM 800-160 MG PO TABS: 1.0000 | ORAL_TABLET | Freq: Two times a day (BID) | ORAL | 0 refills | Status: AC

## 2024-03-03 NOTE — Progress Notes (Signed)
 Staff message sent to Dr. Gretta and Alan Glance, RN regarding the pt's Positive MRSA and Staph screening results from 03/03/24. This pt is scheduled for surgery on 03/11/24.

## 2024-03-03 NOTE — Progress Notes (Signed)
 PCP - [facility] Cardiologist - Dr. Vina Gull, LOV 09/19/2023 Neurology: Dr. Eather Popp  PPM/ICD - denies Device Orders - na Rep Notified - na  Chest x-ray - na EKG - PAT, 03/03/2024 Stress Test -  ECHO - 12/10/2021 Cardiac Cath -   Sleep Study - denies CPAP - na  Non-diabetic  Blood Thinner Instructions: Eliquis , hold 3 days Aspirin  Instructions: denies  ERAS Protcol - NPO  Anesthesia review: Yes. HTN, stroke, HLD, A-fib, having vascular surgery  Patient denies shortness of breath, fever, cough and chest pain at PAT appointment   All instructions explained to the patient, with a verbal understanding of the material. Patient agrees to go over the instructions while at home for a better understanding. Patient also instructed to self quarantine after being tested for COVID-19. The opportunity to ask questions was provided.

## 2024-03-03 NOTE — Progress Notes (Addendum)
 Alan Glance, RN at Dr. Gaylene office made aware of urine results of cloudy with moderate leuks. She is being started on Bactrim

## 2024-03-04 ENCOUNTER — Other Ambulatory Visit: Payer: Self-pay

## 2024-03-04 DIAGNOSIS — I6523 Occlusion and stenosis of bilateral carotid arteries: Secondary | ICD-10-CM

## 2024-03-04 NOTE — Anesthesia Preprocedure Evaluation (Addendum)
 "                                  Anesthesia Evaluation  Patient identified by MRN, date of birth, ID band Patient awake    Reviewed: Allergy & Precautions, H&P , NPO status , Patient's Chart, lab work & pertinent test results  Airway Mallampati: II   Neck ROM: full    Dental   Pulmonary neg pulmonary ROS   breath sounds clear to auscultation       Cardiovascular hypertension, + Peripheral Vascular Disease  + dysrhythmias Atrial Fibrillation  Rhythm:regular Rate:Normal     Neuro/Psych   Anxiety     CVA    GI/Hepatic ,GERD  ,,  Endo/Other    Renal/GU      Musculoskeletal   Abdominal   Peds  Hematology   Anesthesia Other Findings   Reproductive/Obstetrics                              Anesthesia Physical Anesthesia Plan  ASA: 3  Anesthesia Plan: General   Post-op Pain Management:    Induction: Intravenous  PONV Risk Score and Plan: 3 and Ondansetron , Dexamethasone , Midazolam  and Treatment may vary due to age or medical condition  Airway Management Planned: Oral ETT  Additional Equipment: Arterial line  Intra-op Plan:   Post-operative Plan: Extubation in OR  Informed Consent: I have reviewed the patients History and Physical, chart, labs and discussed the procedure including the risks, benefits and alternatives for the proposed anesthesia with the patient or authorized representative who has indicated his/her understanding and acceptance.     Dental advisory given  Plan Discussed with: CRNA, Anesthesiologist and Surgeon  Anesthesia Plan Comments: (PAT note by Lynwood Hope, PA-C: 83 year old female follows with cardiology for history of paroxysmal atrial fibrillation on Eliquis , CVA, HTN, HLD, carotid disease, moderate tricuspid regurgitation.  Echo 12/2021 showed LVEF 60 to 65%, normal wall motion, normal RV, moderate tricuspid regurgitation.  Last seen by Dr. Okey 09/19/2023, BP elevated at that time.  Diltiazem   was stopped and she was started on amlodipine  5 mg twice daily and metoprolol  was increased to 50 mg twice daily.  Otherwise stable from cardiac standpoint, 1 year follow-up recommended.  Surveillance carotid duplex 12/09/2023 showed high-grade 80 to 99% right ICA stenosis and 40 to 59% left ICA stenosis.  She was seen by vascular surgeon Dr. Gretta who recommended right carotid endarterectomy.  Patient reports she was instructed to hold Eliquis  3 days prior to surgery.  Preop labs reviewed, unremarkable.  EKG 01/02/2024: Atrial fibrillation.  Rate 75.  LAFB.  Minimal voltage criteria for LVH, may be normal variant.  No STEMI change.  Carotid duplex 12/09/2023: Summary:  Right Carotid: Velocities in the right ICA are consistent with a 80-99% stenosis.  Left Carotid: Velocities in the left ICA are consistent with a 40-59% stenosis.  Vertebrals:Bilateral vertebral arteries demonstrate antegrade flow. Low flow  is seen in the left vertebral artery.  Subclavians: Right subclavian artery was stenotic. Normal flow hemodynamics were seen in the left subclavian artery.   TTE 12/10/2021: 1. Left ventricular ejection fraction, by estimation, is 60 to 65%. The  left ventricle has normal function. The left ventricle has no regional  wall motion abnormalities. There is moderate left ventricular hypertrophy  of the basal-septal segment. Left  ventricular diastolic parameters are indeterminate.  2. Right ventricular  systolic function is normal. The right ventricular  size is normal. There is normal pulmonary artery systolic pressure. The  estimated right ventricular systolic pressure is 33.4 mmHg.  3. The mitral valve is normal in structure. No evidence of mitral valve  regurgitation. No evidence of mitral stenosis.  4. Tricuspid valve regurgitation is moderate.  5. The aortic valve is tricuspid. Aortic valve regurgitation is mild. No  aortic stenosis is present.  6. Pulmonic valve regurgitation  is moderate.  7. The inferior vena cava is normal in size with greater than 50%  respiratory variability, suggesting right atrial pressure of 3 mmHg.   Conclusion(s)/Recommendation(s): No intracardiac source of embolism  detected on this transthoracic study. Consider a transesophageal  echocardiogram to exclude cardiac source of embolism if clinically  indicated.    )         Anesthesia Quick Evaluation  "

## 2024-03-04 NOTE — Progress Notes (Signed)
 Anesthesia Chart Review:  83 year old female follows with cardiology for history of paroxysmal atrial fibrillation on Eliquis , CVA, HTN, HLD, carotid disease, moderate tricuspid regurgitation.  Echo 12/2021 showed LVEF 60 to 65%, normal wall motion, normal RV, moderate tricuspid regurgitation.  Last seen by Dr. Okey 09/19/2023, BP elevated at that time.  Diltiazem  was stopped and she was started on amlodipine  5 mg twice daily and metoprolol  was increased to 50 mg twice daily.  Otherwise stable from cardiac standpoint, 1 year follow-up recommended.  Surveillance carotid duplex 12/09/2023 showed high-grade 80 to 99% right ICA stenosis and 40 to 59% left ICA stenosis.  She was seen by vascular surgeon Dr. Gretta who recommended right carotid endarterectomy.  Patient reports she was instructed to hold Eliquis  3 days prior to surgery.  Preop labs reviewed, unremarkable.  EKG 01/02/2024: Atrial fibrillation.  Rate 75.  LAFB.  Minimal voltage criteria for LVH, may be normal variant.  No STEMI change.  Carotid duplex 12/09/2023: Summary:  Right Carotid: Velocities in the right ICA are consistent with a 80-99% stenosis.  Left Carotid: Velocities in the left ICA are consistent with a 40-59% stenosis.  Vertebrals: Bilateral vertebral arteries demonstrate antegrade flow. Low flow  is seen in the left vertebral artery.  Subclavians: Right subclavian artery was stenotic. Normal flow hemodynamics were seen in the left subclavian artery.   TTE 12/10/2021:  1. Left ventricular ejection fraction, by estimation, is 60 to 65%. The  left ventricle has normal function. The left ventricle has no regional  wall motion abnormalities. There is moderate left ventricular hypertrophy  of the basal-septal segment. Left  ventricular diastolic parameters are indeterminate.   2. Right ventricular systolic function is normal. The right ventricular  size is normal. There is normal pulmonary artery systolic pressure. The   estimated right ventricular systolic pressure is 33.4 mmHg.   3. The mitral valve is normal in structure. No evidence of mitral valve  regurgitation. No evidence of mitral stenosis.   4. Tricuspid valve regurgitation is moderate.   5. The aortic valve is tricuspid. Aortic valve regurgitation is mild. No  aortic stenosis is present.   6. Pulmonic valve regurgitation is moderate.   7. The inferior vena cava is normal in size with greater than 50%  respiratory variability, suggesting right atrial pressure of 3 mmHg.   Conclusion(s)/Recommendation(s): No intracardiac source of embolism  detected on this transthoracic study. Consider a transesophageal  echocardiogram to exclude cardiac source of embolism if clinically  indicated.     Lynwood Geofm RIGGERS [facility] Short Stay Center/Anesthesiology Phone [phone] 03/04/2024 2:36 PM

## 2024-03-09 ENCOUNTER — Other Ambulatory Visit: Payer: Self-pay

## 2024-03-09 DIAGNOSIS — I6523 Occlusion and stenosis of bilateral carotid arteries: Secondary | ICD-10-CM

## 2024-03-11 ENCOUNTER — Inpatient Hospital Stay (HOSPITAL_COMMUNITY): Payer: Self-pay | Admitting: Anesthesiology

## 2024-03-11 ENCOUNTER — Encounter (HOSPITAL_COMMUNITY): Payer: Self-pay | Admitting: Vascular Surgery

## 2024-03-11 ENCOUNTER — Other Ambulatory Visit: Payer: Self-pay

## 2024-03-11 ENCOUNTER — Encounter (HOSPITAL_COMMUNITY)
Admission: RE | Disposition: A | Discharge status: Home / Self Care | Payer: Self-pay | Source: Home / Self Care | Attending: Vascular Surgery

## 2024-03-11 ENCOUNTER — Inpatient Hospital Stay (HOSPITAL_COMMUNITY)
Admission: RE | Admit: 2024-03-11 | Discharge: 2024-03-13 | DRG: 039 | Disposition: A | Discharge status: Home / Self Care | Attending: Vascular Surgery | Admitting: Vascular Surgery

## 2024-03-11 ENCOUNTER — Other Ambulatory Visit (HOSPITAL_COMMUNITY): Payer: Self-pay

## 2024-03-11 ENCOUNTER — Encounter (HOSPITAL_COMMUNITY): Payer: Self-pay | Admitting: Physician Assistant

## 2024-03-11 DIAGNOSIS — Z85038 Personal history of other malignant neoplasm of large intestine: Secondary | ICD-10-CM

## 2024-03-11 DIAGNOSIS — I4891 Unspecified atrial fibrillation: Secondary | ICD-10-CM

## 2024-03-11 DIAGNOSIS — Z7902 Long term (current) use of antithrombotics/antiplatelets: Secondary | ICD-10-CM | POA: Diagnosis not present

## 2024-03-11 DIAGNOSIS — Z823 Family history of stroke: Secondary | ICD-10-CM

## 2024-03-11 DIAGNOSIS — I6523 Occlusion and stenosis of bilateral carotid arteries: Secondary | ICD-10-CM | POA: Diagnosis not present

## 2024-03-11 DIAGNOSIS — F419 Anxiety disorder, unspecified: Secondary | ICD-10-CM

## 2024-03-11 DIAGNOSIS — I6521 Occlusion and stenosis of right carotid artery: Principal | ICD-10-CM | POA: Diagnosis present

## 2024-03-11 DIAGNOSIS — Z7901 Long term (current) use of anticoagulants: Secondary | ICD-10-CM

## 2024-03-11 DIAGNOSIS — Z8673 Personal history of transient ischemic attack (TIA), and cerebral infarction without residual deficits: Secondary | ICD-10-CM | POA: Diagnosis not present

## 2024-03-11 DIAGNOSIS — Z9889 Other specified postprocedural states: Principal | ICD-10-CM

## 2024-03-11 DIAGNOSIS — I959 Hypotension, unspecified: Secondary | ICD-10-CM | POA: Diagnosis not present

## 2024-03-11 DIAGNOSIS — Z79899 Other long term (current) drug therapy: Secondary | ICD-10-CM

## 2024-03-11 DIAGNOSIS — I10 Essential (primary) hypertension: Secondary | ICD-10-CM | POA: Diagnosis present

## 2024-03-11 DIAGNOSIS — K219 Gastro-esophageal reflux disease without esophagitis: Secondary | ICD-10-CM | POA: Diagnosis present

## 2024-03-11 DIAGNOSIS — E7800 Pure hypercholesterolemia, unspecified: Secondary | ICD-10-CM | POA: Diagnosis present

## 2024-03-11 DIAGNOSIS — Z8249 Family history of ischemic heart disease and other diseases of the circulatory system: Secondary | ICD-10-CM | POA: Diagnosis not present

## 2024-03-11 DIAGNOSIS — I739 Peripheral vascular disease, unspecified: Secondary | ICD-10-CM | POA: Diagnosis present

## 2024-03-11 HISTORY — PX: ENDARTERECTOMY: SHX5162

## 2024-03-11 HISTORY — PX: PATCH ANGIOPLASTY: SHX6230

## 2024-03-11 LAB — POCT ACTIVATED CLOTTING TIME: Activated Clotting Time: 327 s

## 2024-03-11 MED ORDER — DOCUSATE SODIUM 100 MG PO CAPS
100.0000 mg | ORAL_CAPSULE | Freq: Every day | ORAL | Status: DC
  Administered 2024-03-12 – 2024-03-13 (×2): 100 mg via ORAL
  Filled 2024-03-11 (×2): qty 1

## 2024-03-11 MED ORDER — LACTATED RINGERS IV SOLN: INTRAVENOUS | Status: DC

## 2024-03-11 MED ORDER — POTASSIUM CHLORIDE CRYS ER 20 MEQ PO TBCR: 40.0000 meq | EXTENDED_RELEASE_TABLET | Freq: Every day | ORAL | Status: DC | PRN

## 2024-03-11 MED ORDER — FENTANYL CITRATE (PF) 250 MCG/5ML IJ SOLN
INTRAMUSCULAR | Status: DC | PRN
  Administered 2024-03-11 (×2): 50 ug via INTRAVENOUS

## 2024-03-11 MED ORDER — PROTAMINE SULFATE 10 MG/ML IV SOLN
INTRAVENOUS | Status: AC
  Filled 2024-03-11: qty 25

## 2024-03-11 MED ORDER — ONDANSETRON HCL 4 MG/2ML IJ SOLN: 4.0000 mg | Freq: Four times a day (QID) | INTRAMUSCULAR | Status: DC | PRN

## 2024-03-11 MED ORDER — PROTAMINE SULFATE 10 MG/ML IV SOLN
INTRAVENOUS | Status: DC | PRN
  Administered 2024-03-11: 50 mg via INTRAVENOUS

## 2024-03-11 MED ORDER — ESMOLOL HCL 100 MG/10ML IV SOLN
INTRAVENOUS | Status: DC | PRN
  Administered 2024-03-11: 40 mg via INTRAVENOUS

## 2024-03-11 MED ORDER — PHENYLEPHRINE 80 MCG/ML (10ML) SYRINGE FOR IV PUSH (FOR BLOOD PRESSURE SUPPORT)
PREFILLED_SYRINGE | INTRAVENOUS | Status: DC | PRN
  Administered 2024-03-11: 8 ug via INTRAVENOUS
  Administered 2024-03-11: 160 ug via INTRAVENOUS

## 2024-03-11 MED ORDER — ACETAMINOPHEN 650 MG RE SUPP: 325.0000 mg | RECTAL | Status: DC | PRN

## 2024-03-11 MED ORDER — PHENYLEPHRINE HCL-NACL 20-0.9 MG/250ML-% IV SOLN
INTRAVENOUS | Status: DC | PRN
  Administered 2024-03-11: 30 ug/min via INTRAVENOUS

## 2024-03-11 MED ORDER — LIDOCAINE 2% (20 MG/ML) 5 ML SYRINGE
INTRAMUSCULAR | Status: DC | PRN
  Administered 2024-03-11: 40 mg via INTRAVENOUS

## 2024-03-11 MED ORDER — AMLODIPINE BESYLATE 5 MG PO TABS: 5.0000 mg | ORAL_TABLET | Freq: Every day | ORAL | Status: DC

## 2024-03-11 MED ORDER — HEPARIN 6000 UNIT IRRIGATION SOLUTION
Status: AC
  Filled 2024-03-11: qty 500

## 2024-03-11 MED ORDER — ROCURONIUM BROMIDE 10 MG/ML (PF) SYRINGE
PREFILLED_SYRINGE | INTRAVENOUS | Status: DC | PRN
  Administered 2024-03-11: 50 mg via INTRAVENOUS

## 2024-03-11 MED ORDER — ORAL CARE MOUTH RINSE: 15.0000 mL | Freq: Once | OROMUCOSAL | Status: AC

## 2024-03-11 MED ORDER — BISACODYL 10 MG RE SUPP: 10.0000 mg | Freq: Every day | RECTAL | Status: DC | PRN

## 2024-03-11 MED ORDER — CHLORHEXIDINE GLUCONATE CLOTH 2 % EX PADS: 6.0000 | MEDICATED_PAD | Freq: Once | CUTANEOUS | Status: DC

## 2024-03-11 MED ORDER — PHENYLEPHRINE 80 MCG/ML (10ML) SYRINGE FOR IV PUSH (FOR BLOOD PRESSURE SUPPORT)
PREFILLED_SYRINGE | INTRAVENOUS | Status: AC
  Filled 2024-03-11: qty 10

## 2024-03-11 MED ORDER — METOPROLOL TARTRATE 5 MG/5ML IV SOLN
2.5000 mg | INTRAVENOUS | Status: DC | PRN
  Filled 2024-03-11: qty 5

## 2024-03-11 MED ORDER — CHLORHEXIDINE GLUCONATE 0.12 % MT SOLN
15.0000 mL | Freq: Once | OROMUCOSAL | Status: AC
  Administered 2024-03-11: 15 mL via OROMUCOSAL
  Filled 2024-03-11: qty 15

## 2024-03-11 MED ORDER — ROCURONIUM BROMIDE 10 MG/ML (PF) SYRINGE
PREFILLED_SYRINGE | INTRAVENOUS | Status: AC
  Filled 2024-03-11: qty 10

## 2024-03-11 MED ORDER — SODIUM CHLORIDE 0.9 % IV SOLN: INTRAVENOUS | Status: DC

## 2024-03-11 MED ORDER — OXYCODONE HCL 5 MG/5ML PO SOLN: 5.0000 mg | Freq: Once | ORAL | Status: DC | PRN

## 2024-03-11 MED ORDER — EPHEDRINE SULFATE-NACL 50-0.9 MG/10ML-% IV SOSY
PREFILLED_SYRINGE | INTRAVENOUS | Status: DC | PRN
  Administered 2024-03-11: 10 mg via INTRAVENOUS
  Administered 2024-03-11 (×3): 5 mg via INTRAVENOUS

## 2024-03-11 MED ORDER — CEFAZOLIN SODIUM-DEXTROSE 2-4 GM/100ML-% IV SOLN
2.0000 g | Freq: Three times a day (TID) | INTRAVENOUS | Status: AC
  Administered 2024-03-11 (×2): 2 g via INTRAVENOUS
  Filled 2024-03-11 (×2): qty 100

## 2024-03-11 MED ORDER — LIDOCAINE-EPINEPHRINE (PF) 1 %-1:200000 IJ SOLN
INTRAMUSCULAR | Status: AC
  Filled 2024-03-11: qty 30

## 2024-03-11 MED ORDER — LIDOCAINE HCL (PF) 1 % IJ SOLN
INTRAMUSCULAR | Status: AC
  Filled 2024-03-11: qty 5

## 2024-03-11 MED ORDER — LIDOCAINE-EPINEPHRINE (PF) 1 %-1:200000 IJ SOLN
INTRAMUSCULAR | Status: DC | PRN
  Administered 2024-03-11: 5 mL

## 2024-03-11 MED ORDER — POLYETHYLENE GLYCOL 3350 17 G PO PACK: 17.0000 g | PACK | Freq: Every day | ORAL | Status: DC | PRN

## 2024-03-11 MED ORDER — PROPOFOL 10 MG/ML IV BOLUS
INTRAVENOUS | Status: DC | PRN
  Administered 2024-03-11: 100 mg via INTRAVENOUS

## 2024-03-11 MED ORDER — ESMOLOL HCL 100 MG/10ML IV SOLN
INTRAVENOUS | Status: AC
  Filled 2024-03-11: qty 10

## 2024-03-11 MED ORDER — HEPARIN SODIUM (PORCINE) 1000 UNIT/ML IJ SOLN
INTRAMUSCULAR | Status: DC | PRN
  Administered 2024-03-11: 7000 [IU] via INTRAVENOUS

## 2024-03-11 MED ORDER — ROSUVASTATIN CALCIUM 20 MG PO TABS
40.0000 mg | ORAL_TABLET | Freq: Every day | ORAL | Status: DC
  Administered 2024-03-12 – 2024-03-13 (×2): 40 mg via ORAL
  Filled 2024-03-11 (×2): qty 2

## 2024-03-11 MED ORDER — SODIUM CHLORIDE 0.9 % IV SOLN: 500.0000 mL | Freq: Once | INTRAVENOUS | Status: DC | PRN

## 2024-03-11 MED ORDER — HEPARIN 6000 UNIT IRRIGATION SOLUTION
Status: DC | PRN
  Administered 2024-03-11: 1

## 2024-03-11 MED ORDER — VANCOMYCIN HCL IN DEXTROSE 1-5 GM/200ML-% IV SOLN
1000.0000 mg | INTRAVENOUS | Status: AC
  Administered 2024-03-11: 1000 mg via INTRAVENOUS
  Filled 2024-03-11: qty 200

## 2024-03-11 MED ORDER — SUGAMMADEX SODIUM 200 MG/2ML IV SOLN
INTRAVENOUS | Status: DC | PRN
  Administered 2024-03-11: 150 mg via INTRAVENOUS

## 2024-03-11 MED ORDER — DEXAMETHASONE SOD PHOSPHATE PF 10 MG/ML IJ SOLN
INTRAMUSCULAR | Status: DC | PRN
  Administered 2024-03-11: 10 mg via INTRAVENOUS

## 2024-03-11 MED ORDER — PHENOL 1.4 % MT LIQD: 1.0000 | OROMUCOSAL | Status: DC | PRN

## 2024-03-11 MED ORDER — ACETAMINOPHEN 325 MG PO TABS: 325.0000 mg | ORAL_TABLET | ORAL | Status: DC | PRN

## 2024-03-11 MED ORDER — METOPROLOL TARTRATE 50 MG PO TABS: 50.0000 mg | ORAL_TABLET | Freq: Two times a day (BID) | ORAL | Status: DC

## 2024-03-11 MED ORDER — LABETALOL HCL 5 MG/ML IV SOLN: 10.0000 mg | INTRAVENOUS | Status: DC | PRN

## 2024-03-11 MED ORDER — DOPAMINE-DEXTROSE 3.2-5 MG/ML-% IV SOLN
INTRAVENOUS | Status: AC
  Filled 2024-03-11: qty 250

## 2024-03-11 MED ORDER — EZETIMIBE 10 MG PO TABS
10.0000 mg | ORAL_TABLET | Freq: Every day | ORAL | Status: DC
  Administered 2024-03-11 – 2024-03-13 (×3): 10 mg via ORAL
  Filled 2024-03-11 (×3): qty 1

## 2024-03-11 MED ORDER — ONDANSETRON HCL 4 MG/2ML IJ SOLN
INTRAMUSCULAR | Status: DC | PRN
  Administered 2024-03-11: 4 mg via INTRAVENOUS

## 2024-03-11 MED ORDER — PSEUDOEPHEDRINE HCL ER 120 MG PO TB12
120.0000 mg | ORAL_TABLET | Freq: Two times a day (BID) | ORAL | Status: DC
  Administered 2024-03-11 – 2024-03-13 (×4): 120 mg via ORAL
  Filled 2024-03-11 (×4): qty 1

## 2024-03-11 MED ORDER — OXYCODONE HCL 5 MG PO TABS: 5.0000 mg | ORAL_TABLET | Freq: Once | ORAL | Status: DC | PRN

## 2024-03-11 MED ORDER — ACETAMINOPHEN 10 MG/ML IV SOLN
INTRAVENOUS | Status: AC
  Filled 2024-03-11: qty 100

## 2024-03-11 MED ORDER — ONDANSETRON HCL 4 MG/2ML IJ SOLN
INTRAMUSCULAR | Status: AC
  Filled 2024-03-11: qty 2

## 2024-03-11 MED ORDER — ACETAMINOPHEN 10 MG/ML IV SOLN
1000.0000 mg | Freq: Once | INTRAVENOUS | Status: AC
  Administered 2024-03-11: 1000 mg via INTRAVENOUS

## 2024-03-11 MED ORDER — HYDRALAZINE HCL 20 MG/ML IJ SOLN: 5.0000 mg | INTRAMUSCULAR | Status: DC | PRN

## 2024-03-11 MED ORDER — PROPOFOL 10 MG/ML IV BOLUS
INTRAVENOUS | Status: AC
  Filled 2024-03-11: qty 20

## 2024-03-11 MED ORDER — LIDOCAINE 2% (20 MG/ML) 5 ML SYRINGE
INTRAMUSCULAR | Status: AC
  Filled 2024-03-11: qty 5

## 2024-03-11 MED ORDER — HEMOSTATIC AGENTS (NO CHARGE) OPTIME
TOPICAL | Status: DC | PRN
  Administered 2024-03-11 (×2): 1 via TOPICAL

## 2024-03-11 MED ORDER — HEPARIN SODIUM (PORCINE) 1000 UNIT/ML IJ SOLN
INTRAMUSCULAR | Status: AC
  Filled 2024-03-11: qty 10

## 2024-03-11 MED ORDER — DOPAMINE-DEXTROSE 3.2-5 MG/ML-% IV SOLN
5.0000 ug/kg/min | INTRAVENOUS | Status: DC
  Administered 2024-03-11: 5 ug/kg/min via INTRAVENOUS

## 2024-03-11 MED ORDER — METOPROLOL TARTRATE 5 MG/5ML IV SOLN
2.5000 mg | Freq: Once | INTRAVENOUS | Status: AC
  Administered 2024-03-11: 2.5 mg via INTRAVENOUS

## 2024-03-11 MED ORDER — LACTATED RINGERS IV SOLN: INTRAVENOUS | Status: AC

## 2024-03-11 MED ORDER — 0.9 % SODIUM CHLORIDE (POUR BTL) OPTIME
TOPICAL | Status: DC | PRN
  Administered 2024-03-11: 2000 mL

## 2024-03-11 MED ORDER — OXYCODONE-ACETAMINOPHEN 5-325 MG PO TABS: 1.0000 | ORAL_TABLET | Freq: Four times a day (QID) | ORAL | Status: DC | PRN

## 2024-03-11 MED ORDER — ASPIRIN 81 MG PO TBEC
81.0000 mg | DELAYED_RELEASE_TABLET | Freq: Every day | ORAL | Status: DC
  Administered 2024-03-12 – 2024-03-13 (×2): 81 mg via ORAL
  Filled 2024-03-11 (×2): qty 1

## 2024-03-11 MED ORDER — FENTANYL CITRATE (PF) 100 MCG/2ML IJ SOLN: 25.0000 ug | INTRAMUSCULAR | Status: DC | PRN

## 2024-03-11 MED ORDER — EPHEDRINE 5 MG/ML INJ
INTRAVENOUS | Status: AC
  Filled 2024-03-11: qty 5

## 2024-03-11 MED ORDER — DEXAMETHASONE SOD PHOSPHATE PF 10 MG/ML IJ SOLN
INTRAMUSCULAR | Status: AC
  Filled 2024-03-11: qty 1

## 2024-03-11 MED ORDER — HYDROMORPHONE HCL 1 MG/ML IJ SOLN: 0.5000 mg | INTRAMUSCULAR | Status: DC | PRN

## 2024-03-11 MED ORDER — CEFAZOLIN SODIUM-DEXTROSE 2-4 GM/100ML-% IV SOLN
2.0000 g | INTRAVENOUS | Status: AC
  Administered 2024-03-11: 2 g via INTRAVENOUS
  Filled 2024-03-11: qty 100

## 2024-03-11 MED ORDER — FENTANYL CITRATE (PF) 100 MCG/2ML IJ SOLN
INTRAMUSCULAR | Status: AC
  Filled 2024-03-11: qty 2

## 2024-03-11 NOTE — H&P (Signed)
 "   Patient name: Janice Curtis      MRN: [id number]        DOB: [birth year]          Sex: female   REASON FOR CONSULT: Carotid stenosis on duplex   HPI: Janice Curtis is a 84 y.o. female, with history of colon cancer, hypertension, hyperlipidemia, prior CVA that presents for evaluation of carotid stenosis on duplex.  Patient is followed by Dr. Rosemarie with neurology.  Patient had a carotid ultrasound on 12/09/2023 showing a high-grade 80-99% right ICA stenosis with a 40 to 59% left ICA stenosis.   Of note her stroke in 2023 was a left MCA infarct with M1 occlusion from A-fib requiring percutaneous thrombectomy and now on eliquis .  States overall she is doing okay except walking with a cane.  No recent neurologic events over the last 6 to 12 months.  Denies any prior neck surgery or neck radiation.       Past Medical History:  Diagnosis Date   Anxiety     Colon cancer (HCC)     Hypercholesterolemia     Hypertension     Obesity     Stroke (HCC) 12/05/2009               Past Surgical History:  Procedure Laterality Date   COLONOSCOPY   12/27/08    simple adenoma/inflammatory polyp at anastomosis   COLONOSCOPY   04/23/2011    Procedure: COLONOSCOPY;  Surgeon: Margo CHRISTELLA Haddock, MD;  Location: [facility];  Service: Endoscopy;  Laterality: N/A;  10:00   IR CT HEAD LTD   12/09/2021   IR PERCUTANEOUS ART THROMBECTOMY/INFUSION INTRACRANIAL INC DIAG ANGIO   12/09/2021   RADIOLOGY WITH ANESTHESIA N/A 12/09/2021    Procedure: RADIOLOGY WITH ANESTHESIA;  Surgeon: Radiologist, Medication, MD;  Location: [facility];  Service: Radiology;  Laterality: N/A;   RIGHT COLECTOMY   11/14/2007               Family History  Problem Relation Age of Onset   Heart attack Mother     Stroke Father            SOCIAL HISTORY: Social History         Socioeconomic History   Marital status: Divorced      Spouse name: Not on file   Number of children: Not on file   Years of education: Not on file   Highest  education level: Not on file  Occupational History   Not on file  Tobacco Use   Smoking status: Never   Smokeless tobacco: Never  Vaping Use   Vaping status: Never Used  Substance and Sexual Activity   Alcohol use: Not Currently   Drug use: No   Sexual activity: Yes  Other Topics Concern   Not on file  Social History Narrative    RAISES HER GRANDSON SINCE HE WAS 2 YO. HE IS 5 YO NOW.    Social Drivers of Acupuncturist Strain: Low Risk  (01/11/2022)    Overall Financial Resource Strain (CARDIA)     Difficulty of Paying Living Expenses: Not hard at all  Food Insecurity: No Food Insecurity (01/18/2023)    Hunger Vital Sign     Worried About Running Out of Food in the Last Year: Never true     Ran Out of Food in the Last Year: Never true  Transportation Needs: No Transportation Needs (01/18/2023)  PRAPARE - Therapist, Art (Medical): No     Lack of Transportation (Non-Medical): No  Physical Activity: Not on file  Stress: Not on file  Social Connections: Not on file  Intimate Partner Violence: Not At Risk (01/18/2023)    Humiliation, Afraid, Rape, and Kick questionnaire     Fear of Current or Ex-Partner: No     Emotionally Abused: No     Physically Abused: No     Sexually Abused: No      Allergies  No Known Allergies           Current Outpatient Medications  Medication Sig Dispense Refill   amLODipine  (NORVASC ) 5 MG tablet Take 1 tablet (5 mg total) by mouth 2 (two) times daily. 60 tablet 6   apixaban  (ELIQUIS ) 5 MG TABS tablet Take 1 tablet (5 mg total) by mouth 2 (two) times daily. 60 tablet 0   clopidogrel  (PLAVIX ) 75 MG tablet Take 75 mg by mouth daily.       diclofenac  Sodium (VOLTAREN ) 1 % GEL Apply 2 g topically in the morning and at bedtime. 150 g 0   ezetimibe  (ZETIA ) 10 MG tablet TAKE 1 TABLET BY MOUTH EVERY DAY 90 tablet 1   metoprolol  tartrate (LOPRESSOR ) 50 MG tablet Take 1 tablet (50 mg total) by mouth 2  (two) times daily. 60 tablet 6   ondansetron  (ZOFRAN ) 4 MG tablet Take 1 tablet (4 mg total) by mouth every 6 (six) hours as needed for nausea. 20 tablet 0   pantoprazole  (PROTONIX ) 40 MG tablet Take 1 tablet (40 mg total) by mouth at bedtime. 30 tablet 0   potassium chloride  SA (KLOR-CON  M) 20 MEQ tablet Take 2 tablets (40 mEq total) by mouth daily. (Patient taking differently: Take 40 mEq by mouth in the morning.) 60 tablet 0   rosuvastatin  (CRESTOR ) 40 MG tablet Place 1 tablet (40 mg total) into feeding tube daily. 30 tablet 0   VITAMIN D , CHOLECALCIFEROL , PO Take 1 tablet by mouth daily.          No current facility-administered medications for this visit.        REVIEW OF SYSTEMS:  [X]  denotes positive finding, [ ]  denotes negative finding Cardiac   Comments:  Chest pain or chest pressure:      Shortness of breath upon exertion:      Short of breath when lying flat:      Irregular heart rhythm:             Vascular      Pain in calf, thigh, or hip brought on by ambulation:      Pain in feet at night that wakes you up from your sleep:       Blood clot in your veins:      Leg swelling:              Pulmonary      Oxygen at home:      Productive cough:       Wheezing:              Neurologic      Sudden weakness in arms or legs:       Sudden numbness in arms or legs:       Sudden onset of difficulty speaking or slurred speech:      Temporary loss of vision in one eye:       Problems with dizziness:  Gastrointestinal      Blood in stool:       Vomited blood:              Genitourinary      Burning when urinating:       Blood in urine:             Psychiatric      Major depression:              Hematologic      Bleeding problems:      Problems with blood clotting too easily:             Skin      Rashes or ulcers:             Constitutional      Fever or chills:          PHYSICAL EXAM: There were no vitals filed for this visit.   GENERAL: The  patient is a well-nourished female, in no acute distress. The vital signs are documented above. CARDIAC: There is a regular rate and rhythm.  VASCULAR:  No prior neck incisions PULMONARY: No respiratory distress. ABDOMEN: Soft and non-tender. MUSCULOSKELETAL: There are no major deformities or cyanosis. NEUROLOGIC: No focal weakness or paresthesias are detected.  Overall grossly intact without obvious deficits SKIN: There are no ulcers or rashes noted. PSYCHIATRIC: The patient has a normal affect.   DATA:    Carotid ultrasound 12/09/2023 with a high-grade 80-99% right ICA stenosis and 40 to 59% left ICA stenosis   Assessment/Plan:   84 y.o. female, with history of colon cancer, hypertension, hyperlipidemia, prior CVA that presents for evaluation of carotid stenosis on duplex.  Patient is followed by Dr. Rosemarie with neurology.  Patient had a carotid ultrasound on 12/09/2023 showing a high-grade 80-99% right ICA stenosis with a 40 to 59% left ICA stenosis.  Of note her stroke in 2023 was a left MCA infarct with M1 occlusion from A-fib requiring percutaneous thrombectomy and now on eliquis .   Now with asymptomatic over 80% right ICA stenosis.  Discussed for asymptomatic carotid disease recommend surgical intervention when stenosis is greater than 80% for stroke risk reduction.  Discussed indication for surgery is for stroke risk reduction.  Discussed that we would recommend right carotid endarterectomy versus carotid stenting with TCAR for stroke risk reduction.  States he remains on Eliquis  for her A-fib and has stopped the Plavix .  I discussed given the need for dual antiplatelet therapy after carotid stenting she would best be served with carotid endarterectomy to avoid triple therapy.  I was able to review her CTA from 2023 and she has a very accessible carotid bifurcation from a surgical standpoint.  I discussed 1% perioperative stroke risk along with other risk and benefits including bleeding  infection cranial nerve injury.       Lonni DOROTHA Gaskins, MD [facility] [place] Office: [phone]     "

## 2024-03-11 NOTE — Transfer of Care (Signed)
 Immediate Anesthesia Transfer of Care Note  Patient: Janice Curtis  Procedure(s) Performed: RIGHT CAROTID ENDARTERECTOMY (Right: Neck) ANGIOPLASTY, USING XENOSURE BIOLOGIC PATCH GRAFT (Right: Neck)  Patient Location: PACU  Anesthesia Type:General  Level of Consciousness: awake, alert , oriented, and patient cooperative  Airway & Oxygen Therapy: Patient Spontanous Breathing and Patient connected to nasal cannula oxygen  Post-op Assessment: Report given to RN, Post -op Vital signs reviewed and unstable, Anesthesiologist notified, Patient moving all extremities X 4, and Patient able to stick tongue midline  Post vital signs: Reviewed and stable  Last Vitals:  Vitals Value Taken Time  BP 108/62 03/11/24 11:01  Temp 36.7 C 03/11/24 10:54  Pulse 89 03/11/24 11:05  Resp 16 03/11/24 11:05  SpO2 100 % 03/11/24 11:05  Vitals shown include unfiled device data.  Last Pain:  Vitals:   03/11/24 0649  TempSrc:   PainSc: 0-No pain         Complications: There were no known notable events for this encounter.

## 2024-03-11 NOTE — Op Note (Signed)
 "   OPERATIVE NOTE  DATE: March 11, 2024  PROCEDURE:   1.  right carotid endarterectomy with bovine patch angioplasty 2.  right intraoperative carotid ultrasound  PRE-OPERATIVE DIAGNOSIS: right asymptomatic high grade carotid stenosis >80%  POST-OPERATIVE DIAGNOSIS: same as above   SURGEON: Lonni DOROTHA Gaskins, MD  ASSISTANT(S): Lucie Apt, PA  ANESTHESIA: general  ESTIMATED BLOOD LOSS: <100 mL  FINDING(S): 1.  No evidence of intimal flap visualized on transverse or longitudinal ultrasonography after endarterectomy and patch angioplasty. 2.  Appropriate doppler flow in both the internal and external carotid artery after endarterectomy and patch angioplasty.  SPECIMEN(S):  Carotid plaque (sent to Pathology)  INDICATIONS:   Janice Curtis is a 84 y.o. female who presents with right asymptomatic high grade carotid stenosis >80%.  I discussed with the patient the risks, benefits, and alternatives to carotid endarterectomy.  I discussed the procedural details of carotid endarterectomy with the patient.  The patient is aware that the risks of carotid endarterectomy include but are not limited to: bleeding, infection, stroke, myocardial infarction, death, cranial nerve injuries both temporary and permanent, neck hematoma, possible airway compromise, labile blood pressure post-operatively, cerebral hyperperfusion syndrome, and possible need for additional interventions in the future. The patient is aware of the risks and agrees to proceed forward with the procedure.  An assistant was needed given the complexity of the case and also for endarterectomy and patch angioplasty.  DESCRIPTION: After full informed written consent was obtained from the patient, the patient was brought back to the operating room and placed supine upon the operating table.  Prior to induction, the patient received IV antibiotics.  After obtaining adequate anesthesia, the patient was placed into semi-Fowler  position with a shoulder roll in place and the patient's neck slightly hyperextended and rotated away from the surgical site.  The patient was prepped in the standard fashion for a right carotid endarterectomy.  I made an incision anterior to the sternocleidomastoid muscle and dissected down through the subcutaneous tissue.  The platysma was opened with electrocautery.  I then used Bovie cautery and blunt dissection to dissect through the underlying platysma and to mobilize the anterior border of the sternocleidomastoid as well as the internal jugular vein laterally.  The facial vein was ligated with 3-0 silk and surgical clips and divided.  After identifying the carotid artery I used Metzenbaum scissors to bluntly dissect the common carotid artery and then controlled this with a umbilical tape.  At this point in time the patient was given 100 units/kg of IV heparin  and we checked an ACT to ensure it was greater than 250.  I then carried my dissection cephalad and mobilized the external carotid artery and superior thyroid  artery and controlled each of these with a vessel loop.  I then dissected out the internal carotid artery well past the distal plaque.  The internal carotid artery was then controlled with a umbilical tape as well. I was careful to identify the vagus nerve between the internal jugular and common carotid and this was presereved.  I was also careful to identify and preserve the hypoglossal nerve and this was preserved.    Once our ACT was confirmed, I proceeded by clamping the internal carotid artery with a angled bulldog clamp first.  The proximal common carotid artery was controlled with a angled debakey clamp.  The external carotid was controlled with a vessel loop.  I subsequently opened the common carotid artery with an 11 blade scalpel in longitudinal fashion and  extended the arteriotomy with Potts scissors onto the ICA past the distal plaque.  I internal carotid artery was tortuous distally  and I elected not to place a shunt.  I then used a Runner, broadcasting/film/video and performed a endarterectomy starting in the common carotid artery.  The external carotid artery was endarterectomized with an eversion technique and I was careful to feather the distal ICA plaque.  The specimen was passed off the field.  The endarterectomy site was then flushed with heparinized saline and I was careful to ensure there were no flaps in the endarterectomy site.  I tacked down the distal flap in the internal carotid artery with a 7-0 Prolene. I then brought a bovine carotid patch on the field and this was sewn in place with a running anastomosis using a 6-0 Prolene distally and a 5-0 proximal with the help of my assistant.  The bovine patch was trimmed accordingly.  The artery was flushed antegrade and retrograde prior to completion of the patch.  Once the patch was complete, I flushed up the external carotid artery first prior to releasing the internal carotid artery clamp.  An intraoperative duplex was performed that showed no evidence of any flaps.  Once I was happy with the intraoperative ultrasound the patient was given protamine  for reversal.  I used surgicel snow to get hemostasis around the patch.  Ultimately the platysma was closed in running fashion with 3-0 Vicryl.  The skin was closed with a running 4-0 Monocryl.  Dermabond was applied with a dry sterile dressing.  The patient was awakened from anesthesia with no new neurological deficit and taken to PACU in stable condition.    COMPLICATIONS: None  CONDITION: Stable  Lonni DOROTHA Gaskins, MD [facility] Office: [phone]  Lonni JINNY Gaskins   03/11/2024, 10:39 AM  "

## 2024-03-11 NOTE — Discharge Instructions (Signed)

## 2024-03-11 NOTE — Progress Notes (Signed)
Pt arrived from ...PACU.., A/ox ..4.pt denies any pain, MD aware,CCMD called. CHG bath given,no further needs at this time    

## 2024-03-11 NOTE — Plan of Care (Signed)

## 2024-03-11 NOTE — Progress Notes (Signed)
 Patient noted to be in A-fib, RVR. EKG performed. MD aware.

## 2024-03-11 NOTE — Plan of Care (Signed)
   Problem: Ischemic Stroke/TIA Tissue Perfusion: Goal: Complications of ischemic stroke/TIA will be minimized Outcome: Progressing

## 2024-03-12 ENCOUNTER — Encounter (HOSPITAL_COMMUNITY): Payer: Self-pay | Admitting: Vascular Surgery

## 2024-03-12 DIAGNOSIS — Z48812 Encounter for surgical aftercare following surgery on the circulatory system: Secondary | ICD-10-CM

## 2024-03-12 LAB — CBC
HCT: 41.6 % (ref 36.0–46.0)
Hemoglobin: 13.9 g/dL (ref 12.0–15.0)
MCH: 27.4 pg (ref 26.0–34.0)
MCHC: 33.4 g/dL (ref 30.0–36.0)
MCV: 81.9 fL (ref 80.0–100.0)
Platelets: 141 K/uL — ABNORMAL LOW (ref 150–400)
RBC: 5.08 MIL/uL (ref 3.87–5.11)
RDW: 14 % (ref 11.5–15.5)
WBC: 15.3 K/uL — ABNORMAL HIGH (ref 4.0–10.5)
nRBC: 0 % (ref 0.0–0.2)

## 2024-03-12 LAB — LIPID PANEL
Cholesterol: 123 mg/dL (ref 0–200)
HDL: 56 mg/dL
LDL Cholesterol: 57 mg/dL (ref 0–99)
Total CHOL/HDL Ratio: 2.2 ratio
Triglycerides: 48 mg/dL
VLDL: 10 mg/dL (ref 0–40)

## 2024-03-12 LAB — BASIC METABOLIC PANEL WITH GFR
Anion gap: 15 (ref 5–15)
BUN: 17 mg/dL (ref 8–23)
CO2: 18 mmol/L — ABNORMAL LOW (ref 22–32)
Calcium: 9.8 mg/dL (ref 8.9–10.3)
Chloride: 97 mmol/L — ABNORMAL LOW (ref 98–111)
Creatinine, Ser: 1.4 mg/dL — ABNORMAL HIGH (ref 0.44–1.00)
GFR, Estimated: 37 mL/min — ABNORMAL LOW
Glucose, Bld: 129 mg/dL — ABNORMAL HIGH (ref 70–99)
Potassium: 4.9 mmol/L (ref 3.5–5.1)
Sodium: 131 mmol/L — ABNORMAL LOW (ref 135–145)

## 2024-03-12 MED ORDER — METOPROLOL TARTRATE 50 MG PO TABS
50.0000 mg | ORAL_TABLET | Freq: Two times a day (BID) | ORAL | Status: DC
  Administered 2024-03-12 – 2024-03-13 (×3): 50 mg via ORAL
  Filled 2024-03-12 (×3): qty 1

## 2024-03-12 NOTE — Anesthesia Postprocedure Evaluation (Signed)
"   Anesthesia Post Note  Patient: REONA ZENDEJAS  Procedure(s) Performed: RIGHT CAROTID ENDARTERECTOMY (Right: Neck) ANGIOPLASTY, USING XENOSURE BIOLOGIC PATCH GRAFT (Right: Neck)     Patient location during evaluation: PACU Anesthesia Type: General Level of consciousness: awake and alert Pain management: pain level controlled Vital Signs Assessment: post-procedure vital signs reviewed and stable Respiratory status: spontaneous breathing, nonlabored ventilation, respiratory function stable and patient connected to nasal cannula oxygen Cardiovascular status: blood pressure returned to baseline and stable Postop Assessment: no apparent nausea or vomiting Anesthetic complications: no   There were no known notable events for this encounter.  Last Vitals:  Vitals:   03/12/24 1600 03/12/24 1700  BP: 124/69 129/72  Pulse: 77 76  Resp: 19 18  Temp:  36.7 C  SpO2: 97% 100%    Last Pain:  Vitals:   03/12/24 1700  TempSrc: Oral  PainSc: 0-No pain                 Berlin Mokry S      "

## 2024-03-12 NOTE — Progress Notes (Signed)
 PHARMACIST LIPID MONITORING   Janice Curtis is a 84 y.o. female admitted on 03/11/2024 with PVD.  Pharmacy has been consulted to optimize lipid-lowering therapy with the indication of secondary prevention for clinical ASCVD.  Recent Labs:  Lipid Panel (last 6 months):   Lab Results  Component Value Date   CHOL 123 03/12/2024   TRIG 48 03/12/2024   HDL 56 03/12/2024   CHOLHDL 2.2 03/12/2024   VLDL 10 03/12/2024   LDLCALC 57 03/12/2024    Hepatic function panel (last 6 months):   Lab Results  Component Value Date   AST 27 03/03/2024   ALT 16 03/03/2024   ALKPHOS 77 03/03/2024   BILITOT 0.7 03/03/2024    SCr (since admission):   Serum creatinine: 1.4 mg/dL (H) 98/91/73 9578 Estimated creatinine clearance: 27.9 mL/min (A)  Current therapy and lipid therapy tolerance Current lipid-lowering therapy: rosuvastatin  40mg /d, ezetimibe  Documented or reported allergies or intolerances to lipid-lowering therapies (if applicable): none   Plan:    1.Statin intensity (high intensity recommended for all patients regardless of the LDL):  No statin changes. The patient is already on a high intensity statin.  2.Add ezetimibe  (if any one of the following):   Not indicated at this time.  3.Refer to lipid clinic:   No  4.Follow-up with:  Primary care provider - [facility], [facility]  5.Follow-up labs after discharge:  No changes in lipid therapy, repeat a lipid panel in one year.      Prentice Poisson, PharmD Clinical Pharmacist **Pharmacist phone directory can now be found on [email or web address] ([email or web address]).  Listed under [facility].

## 2024-03-12 NOTE — Progress Notes (Addendum)
" °  Progress Note    03/12/2024 8:28 AM 1 Day Post-Op  Subjective: No complaints   Vitals:   03/12/24 0524 03/12/24 0746  BP: 125/85 131/65  Pulse: 83 84  Resp: 16 15  Temp: 97.7 F (36.5 C) 97.6 F (36.4 C)  SpO2: 100% 98%   Physical Exam: Lungs: Nonlabored Incisions: Right neck incision well-appearing without firm hematoma or bleeding Neurologic: Alert and oriented  CBC    Component Value Date/Time   WBC 15.3 (H) 03/12/2024 0421   RBC 5.08 03/12/2024 0421   HGB 13.9 03/12/2024 0421   HCT 41.6 03/12/2024 0421   PLT 141 (L) 03/12/2024 0421   MCV 81.9 03/12/2024 0421   MCH 27.4 03/12/2024 0421   MCHC 33.4 03/12/2024 0421   RDW 14.0 03/12/2024 0421   LYMPHSABS 1.0 01/18/2023 1743   MONOABS 1.0 01/18/2023 1743   EOSABS 0.0 01/18/2023 1743   BASOSABS 0.0 01/18/2023 1743    BMET    Component Value Date/Time   NA 131 (L) 03/12/2024 0421   K 4.9 03/12/2024 0421   CL 97 (L) 03/12/2024 0421   CO2 18 (L) 03/12/2024 0421   GLUCOSE 129 (H) 03/12/2024 0421   BUN 17 03/12/2024 0421   CREATININE 1.40 (H) 03/12/2024 0421   CALCIUM  9.8 03/12/2024 0421   GFRNONAA 37 (L) 03/12/2024 0421   GFRAA >60 08/14/2015 1815    INR    Component Value Date/Time   INR 1.1 03/03/2024 1400     Intake/Output Summary (Last 24 hours) at 03/12/2024 0828 Last data filed at 03/12/2024 0746 Gross per 24 hour  Intake 1556.05 ml  Output 2800 ml  Net -1243.95 ml     Assessment/Plan:  84 y.o. female is s/p right carotid endarterectomy for high-grade asymptomatic stenosis of the right ICA 1 Day Post-Op   Subjectively feeling well after right carotid endarterectomy.  No neurological events overnight.  Her right neck incision is well-appearing.  She is still requiring some dopamine  to maintain adequate systolic blood pressure.  Continue pseudoephedrine .  We will try to wean dopamine  today.  Encouraged out of bed and to mobilize.  Continue to hold Eliquis .  We will order her home beta-blocker for  history of atrial fibrillation.  If weaned from dopamine  and ambulating well, she can possibly be discharged home tomorrow.   Donnice Sender, PA-C [facility] [phone] 03/12/2024 8:28 AM  I have seen and evaluated the patient. I agree with the PA note as documented above.  Postop day 1 status post a right carotid endarterectomy for an asymptomatic high-grade stenosis.  Right neck incision looks great.  Grossly neurologically intact.  Still requiring some dopamine  for blood pressure management.  Continue Sudafed.  Attempt to wean dopamine  today.  Out of bed and mobilize.  Hold Eliquis  until tomorrow.  Will restart her metoprolol  home dose 50 mg BID given history of A-fib.  Likely discharge tomorrow and will repeat labs tomorrow.  Lonni DOROTHA Gaskins, MD [facility] [place] Office: [phone]   "

## 2024-03-12 NOTE — TOC Initial Note (Signed)
 Transition of Care (TOC) - Initial/Assessment Note  Rayfield Gobble RN, BSN Inpatient Care Management Unit 4E- RN Case Manager See Treatment Team for direct phone #   Patient Details  Name: Janice Curtis MRN: [id number] Date of Birth: 01-Jun-1940  Transition of Care [facility]) CM/SW Contact:    Gobble Rayfield Hurst, RN Phone Number: 03/12/2024, 4:07 PM  Clinical Narrative:                 Pt s/p CEA, from home. CM received notification from [facility] liaison that [facility] office made pre-op referral for [facility] needs.   CM spoke with pt at bedside to discuss [facility] referral- pt voiced she would like to have HH follow up and agreeable to referral made to [facility].   [facility] liaison updated to initiate office protocol for [facility] on discharge.   Expected Discharge Plan: Home w Home Health Services Barriers to Discharge: Continued Medical Work up   Patient Goals and CMS Choice Patient states their goals for this hospitalization and ongoing recovery are:: return home   Choice offered to / list presented to : Patient      Expected Discharge Plan and Services   Discharge Planning Services: CM Consult Post Acute Care Choice: Home Health Living arrangements for the past 2 months: Single Family Home                 DME Arranged: N/A DME Agency: NA         HH Agency: [facility]) Date HH Agency Contacted: 03/12/24 Time HH Agency Contacted: 1607 Representative spoke with at [facility] Agency: [place]  Prior Living Arrangements/Services Living arrangements for the past 2 months: Single Family Home Lives with:: Self Patient language and need for interpreter reviewed:: Yes        Need for Family Participation in Patient Care: Yes (Comment) Care giver support system in place?: Yes (comment) Current home services: DME Criminal Activity/Legal Involvement Pertinent to Current Situation/Hospitalization: No - Comment as needed  Activities of Daily Living   ADL Screening (condition at  time of admission) Independently performs ADLs?: Yes (appropriate for developmental age) Is the patient deaf or have difficulty hearing?: Yes Does the patient have difficulty seeing, even when wearing glasses/contacts?: No Does the patient have difficulty concentrating, remembering, or making decisions?: No  Permission Sought/Granted Permission sought to share information with : Facility Industrial/product Designer granted to share information with : Yes, Verbal Permission Granted     Permission granted to share info w AGENCY: HH        Emotional Assessment Appearance:: Appears stated age Attitude/Demeanor/Rapport: Engaged Affect (typically observed): Appropriate, Accepting Orientation: : Oriented to Self, Oriented to Place, Oriented to  Time, Oriented to Situation Alcohol / Substance Use: Not Applicable Psych Involvement: No (comment)  Admission diagnosis:  Bilateral carotid artery stenosis [I65.23] Status post carotid endarterectomy [Z98.890] Asymptomatic carotid artery stenosis without infarction, right [I65.21] Patient Active Problem List   Diagnosis Date Noted   Status post carotid endarterectomy 03/11/2024   Asymptomatic carotid artery stenosis without infarction, right 03/11/2024   Carotid stenosis 01/07/2024   Acute pancreatitis 01/18/2023   Atrial fibrillation with rapid ventricular response (HCC) 03/12/2022   History of CVA (cerebrovascular accident) 03/12/2022   Persistent atrial fibrillation (HCC) 01/28/2022   Hypotension 01/28/2022   UTI (urinary tract infection) 01/28/2022   Nausea and vomiting 01/28/2022   Acute renal failure 01/01/2022   Hypokalemia 01/01/2022   GERD without esophagitis 12/21/2021   Obesity due to excess calories with  serious comorbidity 12/21/2021   Normochromic anemia 12/21/2021   Hyperlipidemia 12/15/2021   Dysphagia due to recent stroke 12/15/2021   Middle cerebral artery embolism, left 12/09/2021   Essential hypertension  12/08/2008   History of malignant neoplasm of large intestine 12/08/2008   PCP:  [facility]acy:   [facility] [phone] - [place], [place] - [address] AT [facility] [address] [place] [address] Phone: [phone] Fax: [phone]     Social Drivers of Health (SDOH) Social History: SDOH Screenings   Food Insecurity: No Food Insecurity (03/11/2024)  Housing: Low Risk (03/11/2024)  Transportation Needs: No Transportation Needs (03/11/2024)  Utilities: Not At Risk (03/11/2024)  Financial Resource Strain: Low Risk (01/11/2022)  Social Connections: Moderately Isolated (03/11/2024)  Tobacco Use: Low Risk (03/11/2024)   SDOH Interventions:     Readmission Risk Interventions     No data to display

## 2024-03-12 NOTE — Progress Notes (Signed)
 Patient was sitting on side of the bed trying to order breakfast, HR got up to 150's. Once laid back in bed , HR came back to 100-110's.NO PRN needed. Plan of care continues.

## 2024-03-12 NOTE — Plan of Care (Signed)
 " Problem: Education: Goal: Knowledge of General Education information will improve Description: Including pain rating scale, medication(s)/side effects and non-pharmacologic comfort measures Outcome: Progressing   Problem: Health Behavior/Discharge Planning: Goal: Ability to manage health-related needs will improve Outcome: Progressing   Problem: Clinical Measurements: Goal: Ability to maintain clinical measurements within normal limits will improve Outcome: Progressing Goal: Will remain free from infection Outcome: Progressing Goal: Diagnostic test results will improve Outcome: Progressing Goal: Respiratory complications will improve Outcome: Progressing Goal: Cardiovascular complication will be avoided Outcome: Progressing   Problem: Activity: Goal: Risk for activity intolerance will decrease Outcome: Progressing   Problem: Nutrition: Goal: Adequate nutrition will be maintained Outcome: Progressing   Problem: Coping: Goal: Level of anxiety will decrease Outcome: Progressing   Problem: Elimination: Goal: Will not experience complications related to bowel motility Outcome: Progressing Goal: Will not experience complications related to urinary retention Outcome: Progressing   Problem: Pain Managment: Goal: General experience of comfort will improve and/or be controlled Outcome: Progressing   Problem: Safety: Goal: Ability to remain free from injury will improve Outcome: Progressing   Problem: Skin Integrity: Goal: Risk for impaired skin integrity will decrease Outcome: Progressing   Problem: Education: Goal: Knowledge of the prescribed therapeutic regimen will improve Outcome: Progressing   Problem: Bowel/Gastric: Goal: Gastrointestinal status for postoperative course will improve Outcome: Progressing   Problem: Cardiac: Goal: Ability to maintain an adequate cardiac output Outcome: Progressing Goal: Will show no evidence of cardiac arrhythmias Outcome:  Progressing   Problem: Nutritional: Goal: Will attain and maintain optimal nutritional status Outcome: Progressing   Problem: Neurological: Goal: Will regain or maintain usual level of consciousness Outcome: Progressing   Problem: Clinical Measurements: Goal: Ability to maintain clinical measurements within normal limits Outcome: Progressing Goal: Postoperative complications will be avoided or minimized Outcome: Progressing   Problem: Respiratory: Goal: Will regain and/or maintain adequate ventilation Outcome: Progressing Goal: Respiratory status will improve Outcome: Progressing   Problem: Skin Integrity: Goal: Demonstrates signs of wound healing without infection Outcome: Progressing   Problem: Urinary Elimination: Goal: Will remain free from infection Outcome: Progressing Goal: Ability to achieve and maintain adequate urine output Outcome: Progressing   Problem: Education: Goal: Knowledge of discharge needs will improve Outcome: Progressing   Problem: Clinical Measurements: Goal: Postoperative complications will be avoided or minimized Outcome: Progressing   Problem: Respiratory: Goal: Will achieve and/or maintain a regular respiratory rate, without signs or symptoms of dyspnea Outcome: Progressing   Problem: Skin Integrity: Goal: Demonstration of wound healing without infection will improve Outcome: Progressing   Problem: Education: Goal: Knowledge of disease or condition will improve Outcome: Progressing Goal: Knowledge of secondary prevention will improve (MUST DOCUMENT ALL) Outcome: Progressing Goal: Knowledge of patient specific risk factors will improve (DELETE if not current risk factor) Outcome: Progressing   Problem: Ischemic Stroke/TIA Tissue Perfusion: Goal: Complications of ischemic stroke/TIA will be minimized Outcome: Progressing   Problem: Coping: Goal: Will verbalize positive feelings about self Outcome: Progressing Goal: Will identify  appropriate support needs Outcome: Progressing   Problem: Health Behavior/Discharge Planning: Goal: Ability to manage health-related needs will improve Outcome: Progressing Goal: Goals will be collaboratively established with patient/family Outcome: Progressing   Problem: Self-Care: Goal: Ability to participate in self-care as condition permits will improve Outcome: Progressing Goal: Verbalization of feelings and concerns over difficulty with self-care will improve Outcome: Progressing Goal: Ability to communicate needs accurately will improve Outcome: Progressing   Problem: Nutrition: Goal: Risk of aspiration will decrease Outcome: Progressing Goal: Dietary intake will improve Outcome: Progressing   "

## 2024-03-12 NOTE — Progress Notes (Signed)
 Helped patient to get out of the bed, tried to walk. Patient was little unstable in her left leg, stated that's her weak leg and has been walking with a cane. Walked approximately 20 feet , HR was in 130's patient was slightly SOB. HR went back down to 90's as she sat back down. Stopped dopamine  drip to check if it can be weaned off. HR has been better since am metoprolol . Plan of care continues.   03/12/24 1449  Vitals  Temp 98.1 F (36.7 C)  Temp Source Oral  BP 135/69  MAP (mmHg) 89  BP Location Left Arm  BP Method Automatic  Patient Position (if appropriate) Lying  Pulse Rate 96  Pulse Rate Source Monitor  ECG Heart Rate 96  Resp 19  MEWS COLOR  MEWS Score Color Green  Oxygen Therapy  SpO2 99 %  O2 Device Room Air  MEWS Score  MEWS Temp 0  MEWS Systolic 0  MEWS Pulse 0  MEWS RR 0  MEWS LOC 0  MEWS Score 0

## 2024-03-13 ENCOUNTER — Other Ambulatory Visit (HOSPITAL_COMMUNITY): Payer: Self-pay

## 2024-03-13 LAB — CBC
HCT: 37.4 % (ref 36.0–46.0)
Hemoglobin: 12.2 g/dL (ref 12.0–15.0)
MCH: 26.9 pg (ref 26.0–34.0)
MCHC: 32.6 g/dL (ref 30.0–36.0)
MCV: 82.6 fL (ref 80.0–100.0)
Platelets: 138 K/uL — ABNORMAL LOW (ref 150–400)
RBC: 4.53 MIL/uL (ref 3.87–5.11)
RDW: 14.4 % (ref 11.5–15.5)
WBC: 12.9 K/uL — ABNORMAL HIGH (ref 4.0–10.5)
nRBC: 0 % (ref 0.0–0.2)

## 2024-03-13 LAB — BASIC METABOLIC PANEL WITH GFR
Anion gap: 11 (ref 5–15)
BUN: 25 mg/dL — ABNORMAL HIGH (ref 8–23)
CO2: 22 mmol/L (ref 22–32)
Calcium: 9.2 mg/dL (ref 8.9–10.3)
Chloride: 100 mmol/L (ref 98–111)
Creatinine, Ser: 1.06 mg/dL — ABNORMAL HIGH (ref 0.44–1.00)
GFR, Estimated: 52 mL/min — ABNORMAL LOW
Glucose, Bld: 106 mg/dL — ABNORMAL HIGH (ref 70–99)
Potassium: 4.4 mmol/L (ref 3.5–5.1)
Sodium: 133 mmol/L — ABNORMAL LOW (ref 135–145)

## 2024-03-13 MED ORDER — APIXABAN 5 MG PO TABS
5.0000 mg | ORAL_TABLET | Freq: Two times a day (BID) | ORAL | 0 refills | Status: AC
  Filled 2024-03-13: qty 60, 30d supply, fill #0

## 2024-03-13 MED ORDER — OXYCODONE-ACETAMINOPHEN 5-325 MG PO TABS
1.0000 | ORAL_TABLET | Freq: Four times a day (QID) | ORAL | 0 refills | Status: AC | PRN
  Filled 2024-03-13: qty 15, 4d supply, fill #0

## 2024-03-13 MED ORDER — ASPIRIN 81 MG PO TBEC: 81.0000 mg | DELAYED_RELEASE_TABLET | Freq: Every day | ORAL | Status: AC

## 2024-03-13 NOTE — Discharge Summary (Signed)
 " Discharge Summary     Janice Curtis 11/07/1940 84 y.o. female  [id number]  Admission Date: 03/11/2024  Discharge Date: 03/13/24  Physician: Dr. Gretta  Admission Diagnosis: Bilateral carotid artery stenosis [I65.23] Status post carotid endarterectomy [Z98.890] Asymptomatic carotid artery stenosis without infarction, right [I65.21]  Discharge Day services:    03/13/24  Hospital Course:  Janice Curtis is an 84 year old female who underwent right carotid endarterectomy by Dr. Gretta on 03/11/2024 due to high-grade asymptomatic stenosis of the right ICA.  She tolerated the procedure well and was admitted to the hospital postoperatively.  She experienced hypotension and required low-dose dopamine  infusion to maintain adequate systolic blood pressure.  She was also started on pseudoephedrine  by mouth to help with hypotension.  She was kept an additional night for this reason.  Postoperative day #2 dopamine  had been successfully weaned.  Systolic blood pressure was maintained in the 130s and 140s.  Throughout her hospital stay of the right neck incision was well-appearing without hematoma.  She was ambulating well and had not experienced any neurologic events postoperatively.  She will be discharged home in stable condition.  She will follow-up in the office in 2 to 3 weeks.  She is okay to resume her Eliquis  for atrial fibrillation and I have provided her with a refill.  She will need aspirin  and statin daily.  She was discharged home in stable condition.   Recent Labs    03/12/24 0421 03/13/24 0410  NA 131* 133*  K 4.9 4.4  CL 97* 100  CO2 18* 22  GLUCOSE 129* 106*  BUN 17 25*  CALCIUM  9.8 9.2   Recent Labs    03/12/24 0421 03/13/24 0410  WBC 15.3* 12.9*  HGB 13.9 12.2  HCT 41.6 37.4  PLT 141* 138*   No results for input(s): INR in the last 72 hours.     Discharge Diagnosis:  Bilateral carotid artery stenosis [I65.23] Status post carotid endarterectomy  [Z98.890] Asymptomatic carotid artery stenosis without infarction, right [I65.21]  Secondary Diagnosis: Patient Active Problem List   Diagnosis Date Noted   Status post carotid endarterectomy 03/11/2024   Asymptomatic carotid artery stenosis without infarction, right 03/11/2024   Carotid stenosis 01/07/2024   Acute pancreatitis 01/18/2023   Atrial fibrillation with rapid ventricular response (HCC) 03/12/2022   History of CVA (cerebrovascular accident) 03/12/2022   Persistent atrial fibrillation (HCC) 01/28/2022   Hypotension 01/28/2022   UTI (urinary tract infection) 01/28/2022   Nausea and vomiting 01/28/2022   Acute renal failure 01/01/2022   Hypokalemia 01/01/2022   GERD without esophagitis 12/21/2021   Obesity due to excess calories with serious comorbidity 12/21/2021   Normochromic anemia 12/21/2021   Hyperlipidemia 12/15/2021   Dysphagia due to recent stroke 12/15/2021   Middle cerebral artery embolism, left 12/09/2021   Essential hypertension 12/08/2008   History of malignant neoplasm of large intestine 12/08/2008   Past Medical History:  Diagnosis Date   Anxiety    Carotid artery occlusion    Colon cancer (HCC)    Dysrhythmia    A-fib   Hypercholesterolemia    Hypertension    Obesity    Stroke (HCC) 12/05/2009    Allergies as of 03/13/2024   No Known Allergies      Medication List     TAKE these medications    amLODipine  5 MG tablet Commonly known as: NORVASC  Take 1 tablet (5 mg total) by mouth 2 (two) times daily.   aspirin  EC 81 MG tablet Take 1 tablet (81  mg total) by mouth daily at 6 (six) AM. Swallow whole. Start taking on: March 14, 2024 What changed:  medication strength how much to take when to take this reasons to take this additional instructions   clopidogrel  75 MG tablet Commonly known as: PLAVIX  Take 75 mg by mouth daily.   Eliquis  5 MG Tabs tablet Generic drug: apixaban  Take 1 tablet (5 mg total) by mouth 2 (two) times  daily.   ezetimibe  10 MG tablet Commonly known as: ZETIA  TAKE 1 TABLET BY MOUTH EVERY DAY   ibuprofen  200 MG tablet Commonly known as: ADVIL  Take 400 mg by mouth 2 (two) times daily as needed for headache (pain).   metoprolol  tartrate 50 MG tablet Commonly known as: LOPRESSOR  Take 1 tablet (50 mg total) by mouth 2 (two) times daily.   oxyCODONE -acetaminophen  5-325 MG tablet Commonly known as: PERCOCET/ROXICET Take 1 tablet by mouth every 6 (six) hours as needed for moderate pain (pain score 4-6).   potassium chloride  SA 20 MEQ tablet Commonly known as: KLOR-CON  M Take 2 tablets (40 mEq total) by mouth daily. What changed: when to take this   rosuvastatin  40 MG tablet Commonly known as: CRESTOR  Place 1 tablet (40 mg total) into feeding tube daily. What changed: how to take this   Vitamin D3 1000 units Caps Take 1,000 Units by mouth daily.         Discharge Instructions:   [facility] Discharge Instructions Carotid Endarterectomy (CEA)  Please refer to the following instructions for your post-procedure care. Your surgeon or physician assistant will discuss any changes with you.  Activity  You are encouraged to walk as much as you can. You can slowly return to normal activities but must avoid strenuous activity and heavy lifting until your doctor tell you it's OK. Avoid activities such as vacuuming or swinging a golf club. You can drive after one week if you are comfortable and you are no longer taking prescription pain medications. It is normal to feel tired for serval weeks after your surgery. It is also normal to have difficulty with sleep habits, eating, and bowel movements after surgery. These will go away with time.  Bathing/Showering  You may shower after you come home. Do not soak in a bathtub, hot tub, or swim until the incision heals completely.  Incision Care  Shower every day. Clean your incision with mild soap and water .  Pat the area dry with a clean towel. You do not need a bandage unless otherwise instructed. Do not apply any ointments or creams to your incision. You may have skin glue on your incision. Do not peel it off. It will come off on its own in about one week. Your incision may feel thickened and raised for several weeks after your surgery. This is normal and the skin will soften over time. For Men Only: It's OK to shave around the incision but do not shave the incision itself for 2 weeks. It is common to have numbness under your chin that could last for several months.  Diet  Resume your normal diet. There are no special food restrictions following this procedure. A low fat/low cholesterol diet is recommended for all patients with vascular disease. In order to heal from your surgery, it is CRITICAL to get adequate nutrition. Your body requires vitamins, minerals, and protein. Vegetables are the best source of vitamins and minerals. Vegetables also provide the perfect balance of protein. Processed food has little nutritional value, so  try to avoid this.  Medications  Resume taking all of your medications unless your doctor or physician assistant tells you not to.  If your incision is causing pain, you may take over-the- counter pain relievers such as acetaminophen  (Tylenol ). If you were prescribed a stronger pain medication, please be aware these medications can cause nausea and constipation.  Prevent nausea by taking the medication with a snack or meal. Avoid constipation by drinking plenty of fluids and eating foods with a high amount of fiber, such as fruits, vegetables, and grains. Do not take Tylenol  if you are taking prescription pain medications.  Follow Up  Our office will schedule a follow up appointment 2-3 weeks following discharge.  Please call us  immediately for any of the following conditions  Increased pain, redness, drainage (pus) from your incision site. Fever of 101 degrees or  higher. If you should develop stroke (slurred speech, difficulty swallowing, weakness on one side of your body, loss of vision) you should call 911 and go to the nearest emergency room.  Reduce your risk of vascular disease:  Stop smoking. If you would like help call [facility] at 1-800-QUIT-NOW ((820)115-7071) or [place] at 872-524-4263. Manage your cholesterol Maintain a desired weight Control your diabetes Keep your blood pressure down  If you have any questions, please call the office at [phone].  Disposition: home  Patient's condition: is Good  Follow up: 1. [facility] in 2-3 weeks.   Donnice Sender, PA-C [facility] [phone]   --- For [facility] Registry use ---   Modified Rankin score at D/C (0-6): 0  IV medication needed for:  1. Hypertension: No 2. Hypotension: Yes  Post-op Complications: No  1. Post-op CVA or TIA: No  If yes: Event classification (right eye, left eye, right cortical, left cortical, verterobasilar, other):   If yes: Timing of event (intra-op, <6 hrs post-op, >=6 hrs post-op, unknown):   2. CN injury: No  If yes: CN  injuried   3. Myocardial infarction: No  If yes: Dx by (EKG or clinical, Troponin):   4.  CHF: No  5.  Dysrhythmia (new): No  6. Wound infection: No  7. Reperfusion symptoms: No  8. Return to OR: No  If yes: return to OR for (bleeding, neurologic, other CEA incision, other):   Discharge medications: Statin use:  Yes ASA use:  Yes   Beta blocker use:  Yes ACE-Inhibitor use:  No  ARB use:  No CCB use: Yes P2Y12 Antagonist use: No, [ ]  Plavix , [ ]  Plasugrel, [ ]  Ticlopinine, [ ]  Ticagrelor, [ ]  Other, [ ]  No for medical reason, [ ]  Non-compliant, [ ]  Not-indicated Anti-coagulant use:  Yes, [ ]  Warfarin, [ ]  Rivaroxaban, [ ]  Dabigatran,    "

## 2024-03-13 NOTE — Progress Notes (Addendum)
 DISCHARGE NOTE HOME Janice Curtis to be discharged Home per MD order. Discussed prescriptions and follow up appointments with the patient. Prescriptions given to patient; medication list explained in detail. Patient verbalized understanding. Verbally reviewed stroke sign and symptoms with patient and [facility]  Skin clean, dry and intact without evidence of skin break down, no evidence of skin tears noted. IV catheter discontinued intact. Site without signs and symptoms of complications. Dressing and pressure applied. Pt denies pain at the site currently. No complaints noted.  See Lda for surgical incision at discharge Patient free of lines, drains, and wounds.   An After Visit Summary (AVS) was printed and given to the patient. Patient will be escorted via wheelchair, and discharged home via private auto.  Peyton SHAUNNA Pepper, RN

## 2024-03-13 NOTE — Plan of Care (Signed)
 " Problem: Education: Goal: Knowledge of General Education information will improve Description: Including pain rating scale, medication(s)/side effects and non-pharmacologic comfort measures Outcome: Progressing   Problem: Health Behavior/Discharge Planning: Goal: Ability to manage health-related needs will improve Outcome: Progressing   Problem: Clinical Measurements: Goal: Ability to maintain clinical measurements within normal limits will improve Outcome: Progressing Goal: Will remain free from infection Outcome: Progressing Goal: Diagnostic test results will improve Outcome: Progressing Goal: Respiratory complications will improve Outcome: Progressing Goal: Cardiovascular complication will be avoided Outcome: Progressing   Problem: Activity: Goal: Risk for activity intolerance will decrease Outcome: Progressing   Problem: Nutrition: Goal: Adequate nutrition will be maintained Outcome: Progressing   Problem: Coping: Goal: Level of anxiety will decrease Outcome: Progressing   Problem: Elimination: Goal: Will not experience complications related to bowel motility Outcome: Progressing Goal: Will not experience complications related to urinary retention Outcome: Progressing   Problem: Pain Managment: Goal: General experience of comfort will improve and/or be controlled Outcome: Progressing   Problem: Safety: Goal: Ability to remain free from injury will improve Outcome: Progressing   Problem: Skin Integrity: Goal: Risk for impaired skin integrity will decrease Outcome: Progressing   Problem: Education: Goal: Knowledge of the prescribed therapeutic regimen will improve Outcome: Progressing   Problem: Bowel/Gastric: Goal: Gastrointestinal status for postoperative course will improve Outcome: Progressing   Problem: Cardiac: Goal: Ability to maintain an adequate cardiac output Outcome: Progressing Goal: Will show no evidence of cardiac arrhythmias Outcome:  Progressing   Problem: Nutritional: Goal: Will attain and maintain optimal nutritional status Outcome: Progressing   Problem: Neurological: Goal: Will regain or maintain usual level of consciousness Outcome: Progressing   Problem: Clinical Measurements: Goal: Ability to maintain clinical measurements within normal limits Outcome: Progressing Goal: Postoperative complications will be avoided or minimized Outcome: Progressing   Problem: Respiratory: Goal: Will regain and/or maintain adequate ventilation Outcome: Progressing Goal: Respiratory status will improve Outcome: Progressing   Problem: Skin Integrity: Goal: Demonstrates signs of wound healing without infection Outcome: Progressing   Problem: Urinary Elimination: Goal: Will remain free from infection Outcome: Progressing Goal: Ability to achieve and maintain adequate urine output Outcome: Progressing   Problem: Education: Goal: Knowledge of discharge needs will improve Outcome: Progressing   Problem: Clinical Measurements: Goal: Postoperative complications will be avoided or minimized Outcome: Progressing   Problem: Respiratory: Goal: Will achieve and/or maintain a regular respiratory rate, without signs or symptoms of dyspnea Outcome: Progressing   Problem: Skin Integrity: Goal: Demonstration of wound healing without infection will improve Outcome: Progressing   Problem: Education: Goal: Knowledge of disease or condition will improve Outcome: Progressing Goal: Knowledge of secondary prevention will improve (MUST DOCUMENT ALL) Outcome: Progressing Goal: Knowledge of patient specific risk factors will improve (DELETE if not current risk factor) Outcome: Progressing   Problem: Ischemic Stroke/TIA Tissue Perfusion: Goal: Complications of ischemic stroke/TIA will be minimized Outcome: Progressing   Problem: Coping: Goal: Will verbalize positive feelings about self Outcome: Progressing Goal: Will identify  appropriate support needs Outcome: Progressing   Problem: Health Behavior/Discharge Planning: Goal: Ability to manage health-related needs will improve Outcome: Progressing Goal: Goals will be collaboratively established with patient/family Outcome: Progressing   Problem: Self-Care: Goal: Ability to participate in self-care as condition permits will improve Outcome: Progressing Goal: Verbalization of feelings and concerns over difficulty with self-care will improve Outcome: Progressing Goal: Ability to communicate needs accurately will improve Outcome: Progressing   Problem: Nutrition: Goal: Risk of aspiration will decrease Outcome: Progressing Goal: Dietary intake will improve Outcome: Progressing   "

## 2024-03-13 NOTE — Progress Notes (Addendum)
" °  Progress Note    03/13/2024 8:03 AM 2 Days Post-Op  Subjective:  no complaints   Vitals:   03/13/24 0514 03/13/24 0515  BP:  133/75  Pulse: 77 76  Resp:  18  Temp: 97.9 F (36.6 C) 97.9 F (36.6 C)  SpO2: 100% 100%   Physical Exam: Lungs:  non labored Incisions:  R neck c/d/i Extremities:  moving all ext well Neurologic: CN grossly intact  CBC    Component Value Date/Time   WBC 12.9 (H) 03/13/2024 0410   RBC 4.53 03/13/2024 0410   HGB 12.2 03/13/2024 0410   HCT 37.4 03/13/2024 0410   PLT 138 (L) 03/13/2024 0410   MCV 82.6 03/13/2024 0410   MCH 26.9 03/13/2024 0410   MCHC 32.6 03/13/2024 0410   RDW 14.4 03/13/2024 0410   LYMPHSABS 1.0 01/18/2023 1743   MONOABS 1.0 01/18/2023 1743   EOSABS 0.0 01/18/2023 1743   BASOSABS 0.0 01/18/2023 1743    BMET    Component Value Date/Time   NA 133 (L) 03/13/2024 0410   K 4.4 03/13/2024 0410   CL 100 03/13/2024 0410   CO2 22 03/13/2024 0410   GLUCOSE 106 (H) 03/13/2024 0410   BUN 25 (H) 03/13/2024 0410   CREATININE 1.06 (H) 03/13/2024 0410   CALCIUM  9.2 03/13/2024 0410   GFRNONAA 52 (L) 03/13/2024 0410   GFRAA >60 08/14/2015 1815    INR    Component Value Date/Time   INR 1.1 03/03/2024 1400     Intake/Output Summary (Last 24 hours) at 03/13/2024 0803 Last data filed at 03/13/2024 0430 Gross per 24 hour  Intake 508.63 ml  Output 450 ml  Net 58.63 ml     Assessment/Plan:  84 y.o. female is s/p R CEA 2 Days Post-Op   No neuro events overnight.  Dopamine  weaned successfully.  R neck incision is well appearing.  Cr improved.  H&H stable.  Ok for discharge home.  Ok to restart Eliquis .  Office will arrange follow up in 2-3 weeks for incision check.   Donnice Sender, PA-C [facility] [phone] 03/13/2024 8:03 AM  I have seen and evaluated the patient. I agree with the PA note as documented above.  Postop day 2 status post right carotid endarterectomy.  Looks good today incision intact  and grossly neurologically intact.  Kept an extra day due to her lab work and creatinine is improved.  Off dopamine .  Discharge with 2 to 3-week follow-up.  Okay to restart Eliquis   Lonni DOROTHA Gaskins, MD [facility] [place] Office: [phone]   "

## 2024-03-13 NOTE — TOC Transition Note (Signed)
 Transition of Care (TOC) - Discharge Note Rayfield Gobble RN, BSN Inpatient Care Management Unit 4E- RN Case Manager See Treatment Team for direct phone #   Patient Details  Name: Janice Curtis MRN: [id number] Date of Birth: [birth year]  Transition of Care [facility] CM/SW Contact:  Gobble Rayfield Hurst, RN Phone Number: 03/13/2024, 9:35 AM   Clinical Narrative:    Pt stable for transition home today, [facility] to follow up for [facility] needs per [facility] office protocol referral. No DME needs noted.   No further ICM needs noted, family to transport home   Final next level of care: Home w Home Health Services Barriers to Discharge: Barriers Resolved   Patient Goals and CMS Choice Patient states their goals for this hospitalization and ongoing recovery are:: return home   Choice offered to / list presented to : Patient      Discharge Placement               Home w/ [facility]        Discharge Plan and Services Additional resources added to the After Visit Summary for     Discharge Planning Services: CM Consult Post Acute Care Choice: Home Health          DME Arranged: N/A DME Agency: NA         HH Agency: [facility] Date HH Agency Contacted: 03/12/24 Time HH Agency Contacted: [id number] Representative spoke with at [facility] Agency: Zebed[facility]  Social Drivers of Health (SDOH) Interventions SDOH Screenings   Food Insecurity: No Food Insecurity (03/11/2024)  Housing: Low Risk (03/11/2024)  Transportation Needs: No Transportation Needs (03/11/2024)  Utilities: Not At Risk (03/11/2024)  Financial Resource Strain: Low Risk (01/11/2022)  Social Connections: Moderately Isolated (03/11/2024)  Tobacco Use: Low Risk (03/11/2024)     Readmission Risk Interventions    03/13/2024    9:35 AM  Readmission Risk Prevention Plan  Post Dischage Appt Complete  Medication Screening Complete  Transportation Screening Complete

## 2024-03-15 ENCOUNTER — Other Ambulatory Visit: Payer: Self-pay | Admitting: Neurology

## 2024-03-15 ENCOUNTER — Other Ambulatory Visit: Payer: Self-pay | Admitting: Internal Medicine

## 2024-03-19 ENCOUNTER — Encounter: Payer: Self-pay | Admitting: Vascular Surgery

## 2024-03-31 ENCOUNTER — Encounter

## 2024-04-28 ENCOUNTER — Encounter
